# Patient Record
Sex: Male | Born: 1940 | Race: White | Hispanic: No | Marital: Married | Smoking: Former smoker
Health system: Southern US, Community
[De-identification: ages and names within clinical notes are randomized; demographics above are authoritative.]

## PROBLEM LIST (undated history)

## (undated) DIAGNOSIS — Z87442 Personal history of urinary calculi: Secondary | ICD-10-CM

## (undated) DIAGNOSIS — N281 Cyst of kidney, acquired: Secondary | ICD-10-CM

## (undated) DIAGNOSIS — G510 Bell's palsy: Secondary | ICD-10-CM

## (undated) DIAGNOSIS — N200 Calculus of kidney: Secondary | ICD-10-CM

## (undated) DIAGNOSIS — R3915 Urgency of urination: Principal | ICD-10-CM

## (undated) DIAGNOSIS — M1990 Unspecified osteoarthritis, unspecified site: Secondary | ICD-10-CM

## (undated) DIAGNOSIS — J383 Other diseases of vocal cords: Secondary | ICD-10-CM

## (undated) DIAGNOSIS — N401 Enlarged prostate with lower urinary tract symptoms: Secondary | ICD-10-CM

## (undated) DIAGNOSIS — C4490 Unspecified malignant neoplasm of skin, unspecified: Secondary | ICD-10-CM

## (undated) DIAGNOSIS — R931 Abnormal findings on diagnostic imaging of heart and coronary circulation: Secondary | ICD-10-CM

## (undated) DIAGNOSIS — IMO0002 Reserved for concepts with insufficient information to code with codable children: Secondary | ICD-10-CM

## (undated) DIAGNOSIS — M48061 Spinal stenosis, lumbar region without neurogenic claudication: Secondary | ICD-10-CM

## (undated) DIAGNOSIS — E785 Hyperlipidemia, unspecified: Secondary | ICD-10-CM

## (undated) DIAGNOSIS — I10 Essential (primary) hypertension: Secondary | ICD-10-CM

## (undated) DIAGNOSIS — K219 Gastro-esophageal reflux disease without esophagitis: Secondary | ICD-10-CM

## (undated) DIAGNOSIS — D649 Anemia, unspecified: Secondary | ICD-10-CM

## (undated) DIAGNOSIS — IMO0001 Reserved for inherently not codable concepts without codable children: Secondary | ICD-10-CM

## (undated) HISTORY — DX: Abnormal findings on diagnostic imaging of heart and coronary circulation: R93.1

## (undated) HISTORY — DX: Reserved for concepts with insufficient information to code with codable children: IMO0002

## (undated) HISTORY — DX: Reserved for inherently not codable concepts without codable children: IMO0001

## (undated) HISTORY — DX: Urgency of urination: R39.15

## (undated) HISTORY — DX: Other diseases of vocal cords: J38.3

## (undated) HISTORY — DX: Essential (primary) hypertension: I10

## (undated) HISTORY — PX: TONSILLECTOMY: SHX5217

## (undated) HISTORY — DX: Hyperlipidemia, unspecified: E78.5

## (undated) HISTORY — DX: Benign prostatic hyperplasia with lower urinary tract symptoms: N40.1

## (undated) HISTORY — DX: Calculus of kidney: N20.0

## (undated) HISTORY — PX: KNEE ARTHROSCOPY: SHX127

## (undated) HISTORY — DX: Bell's palsy: G51.0

## (undated) HISTORY — DX: Gastro-esophageal reflux disease without esophagitis: K21.9

## (undated) HISTORY — PX: MULTIPLE TOOTH EXTRACTIONS: SHX2053

## (undated) HISTORY — DX: Spinal stenosis, lumbar region without neurogenic claudication: M48.061

## (undated) HISTORY — PX: SHOULDER SURGERY: SHX246

## (undated) HISTORY — DX: Unspecified malignant neoplasm of skin, unspecified: C44.90

## (undated) HISTORY — DX: Unspecified osteoarthritis, unspecified site: M19.90

---

## 2000-02-02 ENCOUNTER — Ambulatory Visit (HOSPITAL_BASED_OUTPATIENT_CLINIC_OR_DEPARTMENT_OTHER): Admission: RE | Admit: 2000-02-02 | Discharge: 2000-02-02 | Payer: Self-pay | Admitting: Plastic Surgery

## 2000-02-02 ENCOUNTER — Encounter (INDEPENDENT_AMBULATORY_CARE_PROVIDER_SITE_OTHER): Payer: Self-pay | Admitting: *Deleted

## 2006-05-08 ENCOUNTER — Emergency Department (HOSPITAL_COMMUNITY): Admission: EM | Admit: 2006-05-08 | Discharge: 2006-05-08 | Payer: Self-pay | Admitting: Emergency Medicine

## 2006-08-16 ENCOUNTER — Encounter: Admission: RE | Admit: 2006-08-16 | Discharge: 2006-08-16 | Payer: Self-pay | Admitting: Orthopaedic Surgery

## 2006-10-23 DIAGNOSIS — IMO0001 Reserved for inherently not codable concepts without codable children: Secondary | ICD-10-CM

## 2006-10-23 HISTORY — DX: Reserved for inherently not codable concepts without codable children: IMO0001

## 2007-05-06 ENCOUNTER — Encounter: Admission: RE | Admit: 2007-05-06 | Discharge: 2007-05-06 | Payer: Self-pay | Admitting: Orthopaedic Surgery

## 2007-07-02 ENCOUNTER — Ambulatory Visit: Payer: Self-pay | Admitting: Vascular Surgery

## 2007-07-02 ENCOUNTER — Encounter (INDEPENDENT_AMBULATORY_CARE_PROVIDER_SITE_OTHER): Payer: Self-pay | Admitting: Internal Medicine

## 2007-07-02 ENCOUNTER — Inpatient Hospital Stay (HOSPITAL_COMMUNITY): Admission: EM | Admit: 2007-07-02 | Discharge: 2007-07-03 | Payer: Self-pay | Admitting: Emergency Medicine

## 2007-08-28 ENCOUNTER — Ambulatory Visit (HOSPITAL_BASED_OUTPATIENT_CLINIC_OR_DEPARTMENT_OTHER): Admission: RE | Admit: 2007-08-28 | Discharge: 2007-08-28 | Payer: Self-pay | Admitting: Orthopedic Surgery

## 2007-09-14 ENCOUNTER — Emergency Department (HOSPITAL_COMMUNITY): Admission: EM | Admit: 2007-09-14 | Discharge: 2007-09-14 | Payer: Self-pay | Admitting: Emergency Medicine

## 2008-01-01 ENCOUNTER — Ambulatory Visit (HOSPITAL_BASED_OUTPATIENT_CLINIC_OR_DEPARTMENT_OTHER): Admission: RE | Admit: 2008-01-01 | Discharge: 2008-01-01 | Payer: Self-pay | Admitting: Urology

## 2008-01-24 ENCOUNTER — Ambulatory Visit: Payer: Self-pay | Admitting: Vascular Surgery

## 2008-01-24 ENCOUNTER — Ambulatory Visit (HOSPITAL_COMMUNITY): Admission: RE | Admit: 2008-01-24 | Discharge: 2008-01-24 | Payer: Self-pay | Admitting: Orthopedic Surgery

## 2008-01-24 ENCOUNTER — Encounter (INDEPENDENT_AMBULATORY_CARE_PROVIDER_SITE_OTHER): Payer: Self-pay | Admitting: Orthopedic Surgery

## 2008-01-28 ENCOUNTER — Encounter: Admission: RE | Admit: 2008-01-28 | Discharge: 2008-01-28 | Payer: Self-pay | Admitting: Orthopedic Surgery

## 2008-02-10 ENCOUNTER — Encounter: Admission: RE | Admit: 2008-02-10 | Discharge: 2008-02-10 | Payer: Self-pay | Admitting: Urology

## 2008-06-03 ENCOUNTER — Ambulatory Visit (HOSPITAL_BASED_OUTPATIENT_CLINIC_OR_DEPARTMENT_OTHER): Admission: RE | Admit: 2008-06-03 | Discharge: 2008-06-03 | Payer: Self-pay | Admitting: Orthopedic Surgery

## 2008-12-14 ENCOUNTER — Emergency Department (HOSPITAL_COMMUNITY): Admission: EM | Admit: 2008-12-14 | Discharge: 2008-12-14 | Payer: Self-pay | Admitting: Family Medicine

## 2010-10-23 DIAGNOSIS — G510 Bell's palsy: Secondary | ICD-10-CM

## 2010-10-23 DIAGNOSIS — R931 Abnormal findings on diagnostic imaging of heart and coronary circulation: Secondary | ICD-10-CM

## 2010-10-23 HISTORY — DX: Abnormal findings on diagnostic imaging of heart and coronary circulation: R93.1

## 2010-10-23 HISTORY — DX: Bell's palsy: G51.0

## 2010-11-12 ENCOUNTER — Emergency Department (HOSPITAL_COMMUNITY)
Admission: EM | Admit: 2010-11-12 | Discharge: 2010-11-13 | Payer: Self-pay | Source: Home / Self Care | Admitting: Emergency Medicine

## 2010-11-15 LAB — POCT I-STAT, CHEM 8
BUN: 20 mg/dL (ref 6–23)
Calcium, Ion: 1.09 mmol/L — ABNORMAL LOW (ref 1.12–1.32)
Chloride: 107 mEq/L (ref 96–112)
Creatinine, Ser: 1 mg/dL (ref 0.4–1.5)
Glucose, Bld: 101 mg/dL — ABNORMAL HIGH (ref 70–99)
HCT: 44 % (ref 39.0–52.0)
Hemoglobin: 15 g/dL (ref 13.0–17.0)
Potassium: 3.6 mEq/L (ref 3.5–5.1)
Sodium: 138 mEq/L (ref 135–145)
TCO2: 23 mmol/L (ref 0–100)

## 2010-11-15 LAB — COMPREHENSIVE METABOLIC PANEL
ALT: 19 U/L (ref 0–53)
AST: 18 U/L (ref 0–37)
Albumin: 4.2 g/dL (ref 3.5–5.2)
Alkaline Phosphatase: 42 U/L (ref 39–117)
BUN: 18 mg/dL (ref 6–23)
CO2: 23 mEq/L (ref 19–32)
Calcium: 9.7 mg/dL (ref 8.4–10.5)
Chloride: 103 mEq/L (ref 96–112)
Creatinine, Ser: 0.94 mg/dL (ref 0.4–1.5)
GFR calc Af Amer: >60 mL/min (ref >60)
GFR calc non Af Amer: >60 mL/min (ref >60)
Glucose, Bld: 102 mg/dL — ABNORMAL HIGH (ref 70–99)
Potassium: 3.8 mEq/L (ref 3.5–5.1)
Sodium: 138 mEq/L (ref 135–145)
Total Bilirubin: 0.4 mg/dL (ref 0.3–1.2)
Total Protein: 7 g/dL (ref 6.0–8.3)

## 2010-11-15 LAB — CBC
HCT: 43 % (ref 39.0–52.0)
Hemoglobin: 14.5 g/dL (ref 13.0–17.0)
MCH: 30.4 pg (ref 26.0–34.0)
MCHC: 33.7 g/dL (ref 30.0–36.0)
MCV: 90.1 fL (ref 78.0–100.0)
Platelets: 223 10*3/uL (ref 150–400)
RBC: 4.77 MIL/uL (ref 4.22–5.81)
RDW: 13 % (ref 11.5–15.5)
WBC: 7.5 10*3/uL (ref 4.0–10.5)

## 2010-11-15 LAB — LIPID PANEL
Cholesterol: 154 mg/dL (ref 0–200)
HDL: 32 mg/dL — ABNORMAL LOW (ref >39)
LDL Cholesterol: 88 mg/dL (ref 0–99)
Total CHOL/HDL Ratio: 4.8 RATIO
Triglycerides: 172 mg/dL — ABNORMAL HIGH (ref <150)
VLDL: 34 mg/dL (ref 0–40)

## 2010-11-15 LAB — CK TOTAL AND CKMB (NOT AT ARMC)
CK, MB: 1.4 ng/mL (ref 0.3–4.0)
CK, MB: 1.1 ng/mL (ref 0.3–4.0)
Relative Index: INVALID (ref 0.0–2.5)
Relative Index: INVALID (ref 0.0–2.5)
Total CK: 71 U/L (ref 7–232)
Total CK: 54 U/L (ref 7–232)

## 2010-11-15 LAB — URINALYSIS, ROUTINE W REFLEX MICROSCOPIC
Bilirubin Urine: NEGATIVE
Hgb urine dipstick: NEGATIVE
Ketones, ur: NEGATIVE mg/dL
Nitrite: NEGATIVE
Protein, ur: NEGATIVE mg/dL
Specific Gravity, Urine: 1.025 (ref 1.005–1.030)
Urine Glucose, Fasting: NEGATIVE mg/dL
Urobilinogen, UA: 0.2 mg/dL (ref 0.0–1.0)
pH: 5.5 (ref 5.0–8.0)

## 2010-11-15 LAB — GLUCOSE, CAPILLARY: Glucose-Capillary: 102 mg/dL — ABNORMAL HIGH (ref 70–99)

## 2010-11-15 LAB — TROPONIN I
Troponin I: 0.02 ng/mL (ref 0.00–0.06)
Troponin I: 0.01 ng/mL (ref 0.00–0.06)

## 2010-11-15 LAB — PROTIME-INR
INR: 1.01 (ref 0.00–1.49)
Prothrombin Time: 13.5 seconds (ref 11.6–15.2)

## 2010-11-15 LAB — HEMOGLOBIN A1C
Hgb A1c MFr Bld: 5.7 % — ABNORMAL HIGH (ref <5.7)
Mean Plasma Glucose: 117 mg/dL — ABNORMAL HIGH (ref <117)

## 2010-11-15 LAB — APTT: aPTT: 32 seconds (ref 24–37)

## 2010-11-17 ENCOUNTER — Ambulatory Visit: Payer: Self-pay | Admitting: Cardiology

## 2010-11-19 NOTE — Consult Note (Signed)
NAME:  Adrian Davis, Adrian Davis                ACCOUNT NO.:  [id number]  MEDICAL RECORD NO.:  [id number]          PATIENT TYPE:  EMS  LOCATION:  [facility]                         FACILITY:  [facility]  PHYSICIAN:  Thana Farr, MD    DATE OF BIRTH:  June 23, 1941  DATE OF CONSULTATION:  11/13/2010 DATE OF DISCHARGE:  11/13/2010                                CONSULTATION   Consult called by Dr. Ignacia Palma.  HISTORY:  Adrian Davis is a 70 year old male who reports that about 2 weeks ago he began to have pain around his right ear.  This progressed to the point that over the past week he had the pain associated with numbness radiating to his mouth.  The pain stayed on the right side of the mouth and then went down to jaw.  On the day of presentation, when this numbness evolved to the point that it included the entire right side of his face.  He reports that he also had an episode where he had blurry vision from the right eye.  The episode last from 30 minutes to 4 hours. He describes the pain is severe, going to the top of his head and his neck and spreading to his face.  The pain was started behind the ear. He rates the pain at 8-10/10.  The periods of pain is so severe that it stops him from sleeping.  PAST MEDICAL HISTORY:  Hypertension and cholesterolemia.  SOCIAL HISTORY:  The patient chews tobacco.  There is no history of smoking, alcohol, or illicit drug abuse.  He is married.  He is a retired Visual merchandiser.  MEDICATIONS AT HOME:  Amlodipine, aspirin, benazepril, hydrochlorothiazide, potassium, and simvastatin.  ALLERGIES:  No known drug allergies.  PHYSICAL EXAMINATION:  VITAL SIGNS:  Blood pressure 148/79, heart rate 73, respiratory rate 22, and temperature 97.6. HEENT:  The patient has pain on palpation behind the ear and into the neck.  There is a mild degree of pain on palpation along the jaw line and to the mouth. MENTAL STATUS EXAM:  The patient is alert and oriented.  He follow commands  without difficulty.  Speech is fluent.  On cranial nerve testing, V and VII he has decreased right nasolabial fold.  Would appear there is decreased collage of the right eye.  Motor exam, the patient is 5/5 throughout.  There is normal tone and bulk.  Sensation pinprick and light touch, oriented bilaterally.  Deep tendon reflexes are 1+ throughout with absent ankle jerks.  Plantars are mute bilaterally.  On cerebellar testing, finger-to-nose and heel-to-shin are intact.  LABORATORY DATA:  White blood cell count 7.5, platelet count 223, hemoglobin/hematocrit 14.5, 43.0, sodium 138, potassium 3.8, chloride 103, CO2 of 23, BUN and creatinine 18 and 0.94 respectively.  Glucose 102, SGOT, SGPT 18 and 19 respectively.  PT/INR and PTT 13.5, 1.0, 132. Urinalysis negative.  Troponin negative.  MRI and MRA of the head was performed.  Initially there was some question of a brain stem event. The patient returned back to the MRI scan and was on special cuff on to the brain stem.  Brain stem was normal.  Head CT was unremarkable.  ASSESSMENT:  Adrian Davis is a 70 year old male that presents with a history of symptoms that suggests of a neuralgia.  The patient seems to be having some mild weakness associated with neuralgia as well.  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  Currently though he is able to wrinkle his forehead. Orlene Erm was held with the family.  We have ruled out all concerning possibilities such as aneurysm, stroke, tumor, bleed, and multiple sclerosis.  PLAN: 1. We will give the patient a trial of steroid and antivirals. 2. The patient is to follow with [facility] this week.          ______________________________ Thana Farr, MD     LR/MEDQ  D:  11/13/2010  T:  11/14/2010  Job:  [id number]  Electronically Signed by Thana Farr MD on 11/19/2010 09:37:00 AM

## 2011-01-19 ENCOUNTER — Other Ambulatory Visit: Payer: Self-pay | Admitting: *Deleted

## 2011-01-19 DIAGNOSIS — E785 Hyperlipidemia, unspecified: Secondary | ICD-10-CM

## 2011-01-19 MED ORDER — SIMVASTATIN 10 MG PO TABS: 10.0000 mg | ORAL_TABLET | Freq: Every evening | ORAL | Status: DC

## 2011-01-19 NOTE — Telephone Encounter (Signed)
escribe medication per fax request  

## 2011-02-19 ENCOUNTER — Other Ambulatory Visit: Payer: Self-pay | Admitting: Cardiology

## 2011-02-19 DIAGNOSIS — I10 Essential (primary) hypertension: Secondary | ICD-10-CM

## 2011-02-20 NOTE — Telephone Encounter (Signed)
escribe medication per fax request  

## 2011-03-07 NOTE — Op Note (Signed)
NAME:  Adrian Davis, Adrian Davis                ACCOUNT NO.:  [id number]   MEDICAL RECORD NO.:  [id number]          PATIENT TYPE:  AMB   LOCATION:  [facility]                         FACILITY:  [facility]   PHYSICIAN:  Ollen Gross, M.D.    DATE OF BIRTH:  1941-09-23   DATE OF PROCEDURE:  08/28/2007  DATE OF DISCHARGE:                               OPERATIVE REPORT   PREOPERATIVE DIAGNOSIS:  Left knee meniscal tear and loose body.   POSTOPERATIVE DIAGNOSIS:  Left knee medial meniscal tear, loose body and  hypertrophic synovitis.   PROCEDURE:  Left knee arthroscopy with meniscal debridement and  synovectomy.   SURGEON:  Ollen Gross, M.D., no assistant.   ANESTHESIA:  General.   BLOOD LOSS:  Minimal.   DRAINS:  None.   COMPLICATIONS:  None.   CONDITION:  Stable to recovery.   BRIEF CLINICAL NOTE:  [name] is a 70 year old male with significant  left knee pain and mechanical symptoms.  The knee has been bothering him  for quite awhile getting progressively worse.  He presents now for  arthroscopy and debridement.   PROCEDURE IN DETAIL:  After successful administration of general  anesthetic, a tourniquet placed on the left thigh and left lower  extremity prepped and draped in the usual sterile fashion.  Standard  superomedial and inferolateral incisions were made, inflow cannula  passed superomedial and camera passed inferolateral.  Arthroscopic  visualization proceeds.  The undersurface of patella and trochlea looked  normal.  The suprapatellar area shows a significant amount of synovitis.  The medial and lateral gutters also show some synovitis.  Flexion and  valgus force applied to the knee and the medial compartment is entered.  Spinal needle was used to localize the inferomedial portal, small  incision made, dilator placed and probe placed.  He does have a tear in  the body and posterior horn of the medial meniscus.  This was debrided  back to stable base with baskets and 4.2 mm shaver  and sealed off with  the ArthroCare.  Had a small amount of chondromalacia on the medial  femoral condyle but no unstable cartilage.  I would say it is grade 2  with 3 in the small focal area, otherwise grade 1 and two.  The tibial  plateau looked fine.  Intercondylar notch was visualized, ACL was  normal.  There were two loose pieces tethered down to tissue in the  space just anterior to the ACL.  I debrided the soft tissue around these  with the ArthroCare and then removed those two loose bodies.  The  lateral compartment was then entered and it looked normal.  I then used  the ArthroCare to debride the hypertrophic synovium throughout the  suprapatellar area, infrapatellar area and medial and lateral gutters.  We then inspected the joint again.  There were no further tears or loose  bodies.  The arthroscopic  equipment was removed from the inferior portals which were closed with  interrupted 4-0 nylon.  20 mL of 0.25% Marcaine with epinephrine  injected through the inflow cannula and that is removed and that  portal  closed with nylon.  A bulky sterile dressing is applied and he is  awakened and transferred to recovery in stable condition.      Ollen Gross, M.D.  Electronically Signed     FA/MEDQ  D:  08/28/2007  T:  08/29/2007  Job:  [id number]

## 2011-03-07 NOTE — H&P (Signed)
NAME:  Giancola, Cathan                ACCOUNT NO.:  [id number]   MEDICAL RECORD NO.:  [id number]          PATIENT TYPE:  EMS   LOCATION:  [facility]                         FACILITY:  [facility]   PHYSICIAN:  Elliot Cousin, M.D.    DATE OF BIRTH:  [birth year]   DATE OF ADMISSION:  07/01/2007  DATE OF DISCHARGE:                              HISTORY & PHYSICAL   PRIMARY CARE PHYSICIAN:  Loma Sender, M.D.  The patient is  unassigned to [place].   PRIMARY CARDIOLOGIST:  Peter M. Swazil[place], M.D.   CHIEF COMPLAINT:  Dizziness, nausea, and generalized weakness.   HISTORY OF PRESENT ILLNESS:  The patient is a 70 year old male with a  past medical history significant for hypertension and hyperlipidemia who  presents to the emergency department with the chief complaint of  dizziness, nausea, and generalized weakness.  The patient's symptoms  started a few days ago.  They became worse today.  At approximately 4  p.m., he had symptoms consistent with vertigo.  His dizziness was  described as a spinning dizziness and was associated with nausea and two  episodes of vomiting.  He had no coffee ground emesis and no bright red  blood in his emesis.  He only had a mild amount of abdominal discomfort  located primarily over the lower abdomen.  He denies any associated  diarrhea, constipation, melena, bright red blood per rectum, and painful  urination.  He also denies chest pain, shortness of breath, and  subjective fever and chills.  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  According to his wife, she pulled a tick  from his lower buttocks approximately two months ago.  The patient's  primary care physician ordered a Lyme titer and it was apparently  normal.  The patient has had generalized weakness since the tick bite.   The patient denies headache, visual changes, or swelling in his legs.  He has had a cough which has been dry and intermittent over the past two  months.  His review of  systems is positive for generalized weakness,  easy fatigability, and unintentional weight loss.   During the evaluation in the emergency department, the patient is noted  to be afebrile and hemodynamically stable.  His EKG reveals nonspecific  T wave abnormalities.  His lab data are virtually unremarkable.  His  chest x-ray reveals no acute active cardiopulmonary disease.  However,  given the patient's presentation, he will be admitted for further  evaluation and management.   PAST MEDICAL HISTORY:  1. Hypertension.  2. Hyperlipidemia.  3. Degenerative joint disease.  4. Status post tonsillectomy in the past.  5. Left knee arthroscopic surgery in the past.  6. Chewing tobacco abuse.   MEDICATIONS:  1. Benazepril 20 mg two tablets daily.  2. Toprol-XL 50 mg daily.  3. Hydrochlorothiazide 25 mg daily.  4. Aspirin 81 mg daily.  5. Omeprazole 20 mg two tablets daily.  6. Discontinued Vytorin three weeks ago secondary to muscle aches.  7. Tylenol 650 mg as needed.   ALLERGIES:  No known drug allergies.  SOCIAL HISTORY:  The patient  is married.  He has one son.  [place]  He is a Visual merchandiser.  He chews two to three  packs of tobacco daily.  He denies alcohol and illicit drug use.   FAMILY HISTORY:  His mother is 63 years of age and has degenerative  joint disease.  His father died of complications from a motor vehicle  accident at 69 years of age.  He had a history of congestive heart  failure.   REVIEW OF SYSTEMS:  See the history of present illness.   PHYSICAL EXAMINATION:  VITAL SIGNS:  Temperature 97.4, blood pressure  167/83, pulse 67, respiratory rate 18, oxygen saturation 100% on room  air.  GENERAL:  The patient is a pleasant, large-framed 70 year old Caucasian  man who is currently lying in bed in no acute distress.  HEENT:  Head is normocephalic, atraumatic.  Pupils are equal, round and  reactive to light.  Extraocular movements are  intact.  Conjunctivae  clear.  Sclerae white.  Tympanic membranes are obscured by cerumen  bilaterally.  Nasal mucosa is dry.  No sinus tenderness.  Oropharynx  reveals fair dentition.  Tobacco stains on his tongue noted.  Mucous  membranes are dry.  No posterior exudates or erythema.  No obvious  suspicious lesions of his oral mucosa.  NECK:  Supple.  No adenopathy.  No thyromegaly.  No JVD.  There is a  questionable bruit on the right.  HEART:  S1 and S2 with  soft systolic murmur.  LUNGS:  Clear to auscultation bilaterally.  ABDOMEN:  Mildly obese.  Positive bowel sounds.  Soft, nontender,  nondistended.  No hepatosplenomegaly.  No masses palpated.  RECTAL/GU:  Deferred.  EXTREMITIES:  Pedal pulses are 2+.  No pretibial edema.  No pedal edema.  SKIN:  Skin turgor fair to good.  No obvious rashes noted.  NEUROLOGIC:  The patient is alert and oriented x2.  Cranial nerves II-  XII intact.  No obvious nystagmus.  No pronator drift.  Strength is 5/5  in the sitting position.  Sensation is grossly intact.  Gait not  assessed.   ADMISSION LABORATORY DATA:  CT scan of the head pending.  Sodium 142,  potassium 3.5, chloride 106, CO2 26, glucose 117, BUN 17, creatinine  0.86.  Calcium 9.1, total protein 6.6, albumin 4, AST 14, ALT 15, lipase  18.  CK 67.  Troponin-I less than 0.05.  CK-MB less than 1.  WBC 10,  hemoglobin 13.4, platelets 243,000.   ASSESSMENT:  1. Sign/symptom complex with dizziness, nausea, and generalized      weakness.  The patient appears to have symptomatology consistent      with vertigo.  The vertigo could be benign positional vertigo or      something more serious.  We will need to rule out acute      abnormalities of the cerebral posterior circulation.  2. Nonspecific T wave abnormalities per EKG.  The patient  has no      complaints of chest pain.  However, given his risk factors, cardiac      enzymes will be ordered.  3. Right carotid bruit.  Carotid artery  disease will need to be ruled      out.  4. Borderline hypokalemia.  More than likely the borderline      hypokalemia is secondary to hydrochlorothiazide.  5. History of tick bite in July 2008.  The patient has no  obvious      rashes per exam.  He is completely afebrile.  His white blood cell      count is within normal limits.  Of note, a Lyme titer was obtained      by the patient's primary care physician in July and was apparently      normal.   PLAN:  1. The patient will be admitted for further evaluation and management.  2. The results of his CT scan of the head are pending, will review.  3. For further evaluation, we will check an MRI of the brain and      carotid Dopplers.  4. We will check cardiac enzymes to rule out myocardial infarction,      [facility] Spotted Fever titer, TSH,  vitamin B12, urinalysis      to rule out infection, and an urine drug screen.  5. Will check orthostatic vital signs.  6. Will start Antivert t.i.d.  7. Supportive care and gentle IV fluids.      Elliot Cousin, M.D.  Electronically Signed     DF/MEDQ  D:  07/02/2007  T:  07/02/2007  Job:  [id number]   cc:   Peter M. [place], M.D.

## 2011-03-07 NOTE — Op Note (Signed)
NAME:  Adrian Davis, Adrian Davis                ACCOUNT NO.:  [id number]   MEDICAL RECORD NO.:  [id number]          PATIENT TYPE:  AMB   LOCATION:  [facility]                         FACILITY:  [facility]   PHYSICIAN:  Ollen Gross, M.D.    DATE OF BIRTH:  Feb 17, 1941   DATE OF PROCEDURE:  06/03/2008  DATE OF DISCHARGE:                               OPERATIVE REPORT   PREOPERATIVE DIAGNOSIS:  Left knee chondral defect.   POSTOPERATIVE DIAGNOSES:  1. Left knee chondral defect.  2. Medial meniscal tear.   PROCEDURE:  Left knee arthroscopy with medial meniscal debridement and  chondroplasty medial compartment.   SURGEON:  Ollen Gross, M.D.   ASSISTANT:  None.   ANESTHESIA:  General.   ESTIMATED BLOOD LOSS:  Minimal.   DRAIN:  None.   COMPLICATIONS:  None.   CONDITION.:  Stable to recovery.   CLINICAL NOTE:  Mr. Sweitzer is a 70 year old male with significant left  knee pain, recurrent effusions and mechanical symptoms.  He had an  arthroscopy a year ago, did very well initially, but then started having  progressively worsening dysfunction and pain.  He had injections, which  did not help much.  He had an MRI which showed no evidence of recurrent  tear.  Given his progressive pain, recurrent effusions and mechanical  symptoms with no response to injections, it was felt that he would be a  candidate for repeat arthroscopy.  He reports for that at this point.   PROCEDURE IN DETAIL:  After successful administration of general  anesthetic a tourniquet was placed high on the left thigh and left lower  extremity prepped and draped in the usual sterile fashion.  Standard  superomedial and inferolateral incision is made.  In-flow cannula  passed, superomedial camera passed inferolateral.  Arthroscopic  visualization proceeds.  Undersurface of the patella and the trochlea  show minimal chondromalacia.  He did have grade II lesion on the  undersurface of the patella, but it is fairly local.  The medial  and  lateral gutters were visualized.  There were no loose bodies.  Flexion  and valgus force applied to the knee and the medial compartment  centered.  He does have evidence of a recurrent tear in the body and  posterior horn of the medial meniscus, as well as delaminated cartilage  from the medial femoral condyle.  At the medial most margin of the  joint, there is a small area of exposed bone on the tibia and the femur.  The spinal needle was used to localize the inferomedial portal.  Small  incision made and dilator placed.  The meniscus is debrided back to a  stable base with baskets and a 4.2 mm shaver and sealed off with the  ArthroCare device.  I debrided the unstable cartilage and the surface of  the femur with the shaver down to a stable bony base with stable  cartilaginous edges.  This was a small defect down to bone, but there  was about a 2 x 2 cm area of cartilage that was unstable, but debrided  back to a stable cartilaginous  base.  The rest of the medial compartment  looked fine.  Intercondylar notch was visualized.  There are no loose  bodies and the ACL looks normal.  Lateral compartment is entered and it  looks normal.  The joint is again inspected.  There were no further  tears, loose bodies, or chondral defects.  Arthroscopic equipment was  removed from the inferior portals, which were closed with interrupted 4-  0 nylon.  Twenty mL of 4% Marcaine with epi injected through in-flow  cannula and then that is removed and that portal closed with nylon.  Bulky sterile dressing is then applied and he is awakened and  transported to recovery in stable condition.      Ollen Gross, M.D.  Electronically Signed     FA/MEDQ  D:  06/03/2008  T:  06/03/2008  Job:  [id number]

## 2011-03-07 NOTE — Op Note (Signed)
NAME:  Adrian Davis, Adrian Davis                ACCOUNT NO.:  [id number]   MEDICAL RECORD NO.:  [id number]          PATIENT TYPE:  AMB   LOCATION:  [facility]                         FACILITY:  [facility]   PHYSICIAN:  Excell Seltzer. Annabell Howells, M.D.    DATE OF BIRTH:  1941/05/20   DATE OF PROCEDURE:  01/01/2008  DATE OF DISCHARGE:                               OPERATIVE REPORT   PREOPERATIVE DIAGNOSIS:  Right distal ureteral stone.   POSTOPERATIVE DIAGNOSIS:  Right distal ureteral stone.   PROCEDURE:  1. Cystourethroscopy.  2. Right retrograde pyelogram.  3. Right ureteral dilation.  4. Right ureteroscopic stone manipulation.  5. Intraoperative fluoroscopy with interpretation.   ATTENDING PHYSICIAN:  Excell Seltzer. Annabell Howells, M.D.   RESIDENT PHYSICIAN:  Dr. Maudie Flakes.   ANESTHESIA:  General.   INDICATIONS FOR PROCEDURE:  Mr. Bufford Buttner is a 70 year old white male who  was seen by Dr. Annabell Howells for refractory lower urinary tract symptoms.  Axial imaging demonstrated a distal right ureteral stone.  He has not  passed this stone clinically, as he persists with continued discomfort.  Likewise, he was offered ureteroscopic stone manipulation.  After the  risks and benefits of the operation were discussed with him, informed  consent was obtained.   PROCEDURE IN DETAIL:  Patient is brought to the operating room,  correctly identified by his wrist band, and placed in a supine position.  General anesthesia was delivered.  Once adequately anesthetized, his  perineum was prepped and draped sterilely.  IV antibiotics were  administered.  We began our procedure by performing a rigid  cystourethroscopy with a 22 French rigid cystoscopic sheath and a 12  degree lens.  The urethra was normal through its course and caliber.  Upon entering the bladder, clear urine was identified.  Both ureteral  orifices were noted to be in the normal anatomic position, effluxing  clear urine.  Pan cystoscopy did not demonstrate any urethral  abnormalities.  We then cannulated the right ureteral orifice with a 5  French end-hole catheter and perform right retrograde pyelogram.  The  pyelogram demonstrated a fixed filling defect in the distal ureter,  consistent with known stone.  No other abnormalities were appreciated.  We then advanced a sensor-tipped guidewire through the cystoscope into  the ureteral orifice and directed it up to the level of the right renal  pelvis under direct fluoroscopic guidance.  The guidewire was left in  place, and we used a 12 [place] ureteral dilator over the guidewire to  gently dilate the distal ureter using fluoroscopy as guidance as well.  The dilator was then removed, and we performed semi-rigid ureteroscopy,  which demonstrated the distal right ureteral stone.  It was free-  floating and not impacted.  It appeared to be oriented in a way that  prevented its passage.  We subsequently were able to grasp it with a  Nitinol basket and remove it in an antegrade fashion without significant  ureteral trauma.  The ureteroscope was brought forth out of the urethra,  and the stone was delivered into the specimen cup.  We then replaced the  ureteroscope back into the urethra and guided it retrograde up to the  ureter and closely inspected the ureter.  There was not a significant  amount of bleeding here.  Likewise, we elected not to place a stent.  The ureteroscope and the Glidewire were then removed.  The cystoscope  was replaced.  Repeat cystoscope did not demonstrate any abnormalities.  This was done with a 12 degree and a 70 degree lens.  The bladder was  drained, and this marked the end of our procedure.  He tolerated the  procedure well.  There were no complications.  He was awakened and taken  to the PACU in stable condition.  Dr. Annabell Howells was present and participated  in all aspects of the case.      Duane Boston, MD      Excell Seltzer. Annabell Howells, M.D.  Electronically Signed    BP/MEDQ  D:   01/01/2008  T:  01/01/2008  Job:  [id number]

## 2011-03-16 ENCOUNTER — Telehealth: Payer: Self-pay | Admitting: Cardiology

## 2011-03-16 NOTE — Telephone Encounter (Signed)
Patient wife called and asked if patient needs to have lab work done at appt time.

## 2011-03-21 ENCOUNTER — Other Ambulatory Visit: Payer: Self-pay | Admitting: Cardiology

## 2011-03-21 NOTE — Telephone Encounter (Signed)
escribe medication per fax request  

## 2011-04-10 ENCOUNTER — Other Ambulatory Visit: Payer: Self-pay | Admitting: Cardiology

## 2011-05-02 ENCOUNTER — Other Ambulatory Visit: Payer: Self-pay | Admitting: *Deleted

## 2011-05-02 DIAGNOSIS — E785 Hyperlipidemia, unspecified: Secondary | ICD-10-CM

## 2011-05-09 ENCOUNTER — Other Ambulatory Visit: Payer: Self-pay | Admitting: Cardiology

## 2011-05-09 NOTE — Telephone Encounter (Signed)
escribe medication per fax request  

## 2011-05-15 ENCOUNTER — Other Ambulatory Visit: Payer: Self-pay | Admitting: Orthopaedic Surgery

## 2011-05-15 ENCOUNTER — Other Ambulatory Visit (INDEPENDENT_AMBULATORY_CARE_PROVIDER_SITE_OTHER): Payer: Medicare Other | Admitting: *Deleted

## 2011-05-15 ENCOUNTER — Other Ambulatory Visit: Payer: Self-pay | Admitting: Cardiology

## 2011-05-15 DIAGNOSIS — M25511 Pain in right shoulder: Secondary | ICD-10-CM

## 2011-05-15 DIAGNOSIS — R5383 Other fatigue: Secondary | ICD-10-CM

## 2011-05-15 DIAGNOSIS — Z125 Encounter for screening for malignant neoplasm of prostate: Secondary | ICD-10-CM

## 2011-05-15 DIAGNOSIS — R5381 Other malaise: Secondary | ICD-10-CM

## 2011-05-15 DIAGNOSIS — E785 Hyperlipidemia, unspecified: Secondary | ICD-10-CM

## 2011-05-15 DIAGNOSIS — R39198 Other difficulties with micturition: Secondary | ICD-10-CM

## 2011-05-15 LAB — LIPID PANEL
Cholesterol: 176 mg/dL (ref 0–200)
HDL: 39.3 mg/dL (ref >39.00)
Total CHOL/HDL Ratio: 4
Triglycerides: 229 mg/dL — ABNORMAL HIGH (ref 0.0–149.0)
VLDL: 45.8 mg/dL — ABNORMAL HIGH (ref 0.0–40.0)

## 2011-05-15 LAB — BASIC METABOLIC PANEL
BUN: 19 mg/dL (ref 6–23)
CO2: 28 mEq/L (ref 19–32)
Calcium: 9.1 mg/dL (ref 8.4–10.5)
Chloride: 100 mEq/L (ref 96–112)
Creatinine, Ser: 0.8 mg/dL (ref 0.4–1.5)
GFR: 96.3 mL/min (ref >60.00)
Glucose, Bld: 95 mg/dL (ref 70–99)
Potassium: 4.2 mEq/L (ref 3.5–5.1)
Sodium: 136 mEq/L (ref 135–145)

## 2011-05-15 LAB — PSA: PSA: 0.72 ng/mL (ref 0.10–4.00)

## 2011-05-15 LAB — HEPATIC FUNCTION PANEL
ALT: 15 U/L (ref 0–53)
AST: 14 U/L (ref 0–37)
Albumin: 4.3 g/dL (ref 3.5–5.2)
Alkaline Phosphatase: 40 U/L (ref 39–117)
Bilirubin, Direct: 0 mg/dL (ref 0.0–0.3)
Total Bilirubin: 0.5 mg/dL (ref 0.3–1.2)
Total Protein: 7.2 g/dL (ref 6.0–8.3)

## 2011-05-15 LAB — LDL CHOLESTEROL, DIRECT: Direct LDL: 102 mg/dL

## 2011-05-15 NOTE — Telephone Encounter (Signed)
escribe medication per fax request  

## 2011-05-16 ENCOUNTER — Encounter: Payer: Self-pay | Admitting: *Deleted

## 2011-05-17 ENCOUNTER — Ambulatory Visit (INDEPENDENT_AMBULATORY_CARE_PROVIDER_SITE_OTHER): Payer: Medicare Other | Admitting: Cardiology

## 2011-05-17 ENCOUNTER — Encounter: Payer: Self-pay | Admitting: Cardiology

## 2011-05-17 DIAGNOSIS — I10 Essential (primary) hypertension: Secondary | ICD-10-CM

## 2011-05-17 DIAGNOSIS — E785 Hyperlipidemia, unspecified: Secondary | ICD-10-CM | POA: Insufficient documentation

## 2011-05-17 NOTE — Progress Notes (Signed)
   Adrian Davis Date of Birth: [birth year]   History of Present Illness: Adrian Davis is seen today for followup of hypertension. He continues to do very well. His blood pressure readings at home have been under good control with typical readings of 120 systolic in the morning and 135 systolic in the evening. He denies any headache, chest pain, shortness of breath. 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  Current Outpatient Prescriptions on File Prior to Visit  Medication Sig Dispense Refill  . acetaminophen (TYLENOL) 650 MG CR tablet Take 650 mg by mouth daily.       Marland Kitchen aspirin 81 MG tablet Take 81 mg by mouth daily. coated      . benazepril (LOTENSIN) 20 MG tablet TAKE 1 TABLET TWICE DAILY  60 tablet  5  . hydrochlorothiazide 25 MG tablet TAKE 1 TABLET EVERY DAY BY MOUTH  30 tablet  5  . ibuprofen (ADVIL,MOTRIN) 200 MG tablet Take 200 mg by mouth daily.        Marland Kitchen KLOR-CON M10 10 MEQ tablet TAKE 2 TABLETS BY MOUTH EVERY DAY  60 tablet  5  . ranitidine (ZANTAC) 300 MG tablet TAKE 1 TABLET EVERY DAY  30 tablet  5  . simvastatin (ZOCOR) 10 MG tablet Take 1 tablet (10 mg total) by mouth every evening.  30 tablet  5  . DISCONTD: amLODipine (NORVASC) 5 MG tablet TAKE 2 TABLETS BY MOUTH DAILY AS DIRECTED  60 tablet  5    No Known Allergies  Past Medical History  Diagnosis Date  . Hyperlipidemia   . Hypertension   . Degenerative disk disease   . Bell palsy   . Edema     Past Surgical History  Procedure Date  . Shoulder surgery     LEFT  . Tonsillectomy   . Knee arthroscopy     RIGHT    History  Smoking status  . Former Smoker -- 1.5 packs/day for 10 years  . Types: Cigarettes  . Quit date: 05/15/1976  Smokeless tobacco  . Former User    History  Alcohol Use No    Family History  Problem Relation Age of Onset  . Heart attack Father   . Coronary artery disease Father   . Hypertension Father     Review of Systems:  All other systems were reviewed and are  negative.  Physical Exam: BP 110/72  Pulse 84  Ht 5\' 9"  (1.753 m)  Wt 197 lb 9.6 oz (89.631 kg)  BMI 29.18 kg/m2 He is a pleasant white male in no acute distress. His HEENT exam is unremarkable. He has no JVD or bruits. Lungs are clear. Cardiac exam reveals a regular rate and rhythm without gallop, murmur, or click. Abdomen is soft and nontender. He has no significant edema. Pedal pulses are good. His neurologic exam is nonfocal.  LABORATORY DATA: Recent chemistry panel was normal. Total cholesterol 176, triglycerides 229, HDL 39, and LDL 102.  Assessment / Plan:

## 2011-05-17 NOTE — Assessment & Plan Note (Signed)
His lipid levels are stable from one year ago. He will continue his simvastatin. I recommended he take 2 facial capsules daily. He admits that he needs to do better with a healthy diet.

## 2011-05-17 NOTE — Patient Instructions (Signed)
Take 2 fish oil tablets daily.  Continue other medications.  I will see you again in 6 months.  You are cleared for shoulder and/or knee surgery.

## 2011-05-17 NOTE — Assessment & Plan Note (Signed)
His blood pressure is well controlled on his current medical therapy. I've encouraged him to watch his salt intake carefully. He needs to stay well hydrated when he is out working in the sun.

## 2011-05-18 ENCOUNTER — Telehealth: Payer: Self-pay | Admitting: *Deleted

## 2011-05-18 NOTE — Telephone Encounter (Signed)
Message copied by Lorayne Bender on Thu May 18, 2011  9:55 AM ------      Message from: [place], PETER M      Created: Tue May 16, 2011  8:53 PM       Chemistries and PSA look good. Lipids are a little high. I would change simvastatin to lipitor 20 mg daily and repeat lipids and HFP in 3 months.

## 2011-05-18 NOTE — Telephone Encounter (Signed)
Notified wife of lab results. Wants to switch simvastatin to Lipitor. Per wife in office visit he told him to use fish oil. Did not mention Lipitor. States he would not take Lipitor due to past history of reactions. Will get fish oil

## 2011-05-19 ENCOUNTER — Ambulatory Visit
Admission: RE | Admit: 2011-05-19 | Discharge: 2011-05-19 | Disposition: A | Discharge status: Home / Self Care | Payer: BLUE CROSS/BLUE SHIELD | Source: Ambulatory Visit | Attending: Orthopaedic Surgery | Admitting: Orthopaedic Surgery

## 2011-05-19 DIAGNOSIS — M25511 Pain in right shoulder: Secondary | ICD-10-CM

## 2011-07-11 ENCOUNTER — Other Ambulatory Visit: Payer: Self-pay | Admitting: Cardiology

## 2011-07-11 NOTE — Telephone Encounter (Signed)
escribe medication per fax request  

## 2011-07-17 LAB — POCT I-STAT, CHEM 8
BUN: 22
Calcium, Ion: 1.27
Chloride: 102
Creatinine, Ser: 1
Glucose, Bld: 94
HCT: 43
Hemoglobin: 14.6
Potassium: 3.6
Sodium: 141
TCO2: 25

## 2011-07-21 LAB — POCT I-STAT 4, (NA,K, GLUC, HGB,HCT)
Glucose, Bld: 101 — ABNORMAL HIGH
HCT: 43
Hemoglobin: 14.6
Potassium: 3.7
Sodium: 140

## 2011-08-01 LAB — POCT I-STAT 4, (NA,K, GLUC, HGB,HCT)
Glucose, Bld: 96
HCT: 44
Hemoglobin: 15
Operator id: 268271
Potassium: 4.1
Sodium: 139

## 2011-08-04 LAB — I-STAT 8, (EC8 V) (CONVERTED LAB)
BUN: 20
Bicarbonate: 27.3 — ABNORMAL HIGH
Chloride: 109
Glucose, Bld: 112 — ABNORMAL HIGH
HCT: 41
Hemoglobin: 13.9
Operator id: 151321
Potassium: 3.7
Sodium: 143
TCO2: 29
pCO2, Ven: 54.2 — ABNORMAL HIGH
pH, Ven: 7.311 — ABNORMAL HIGH

## 2011-08-04 LAB — CBC
HCT: 35.4 — ABNORMAL LOW
HCT: 38.5 — ABNORMAL LOW
Hemoglobin: 12.1 — ABNORMAL LOW
Hemoglobin: 13.4
MCHC: 34.3
MCHC: 34.8
MCV: 89.6
MCV: 90.4
Platelets: 196
Platelets: 243
RBC: 3.92 — ABNORMAL LOW
RBC: 4.3
RDW: 13.6
RDW: 13.7
WBC: 10
WBC: 7.1

## 2011-08-04 LAB — BASIC METABOLIC PANEL
BUN: 11
CO2: 27
Calcium: 9
Chloride: 110
Creatinine, Ser: 0.97
GFR calc Af Amer: >60
GFR calc non Af Amer: >60
Glucose, Bld: 86
Potassium: 3.4 — ABNORMAL LOW
Sodium: 145

## 2011-08-04 LAB — CARDIAC PANEL(CRET KIN+CKTOT+MB+TROPI)
CK, MB: 1.3
CK, MB: 1.3
Relative Index: INVALID
Relative Index: INVALID
Total CK: 71
Total CK: 71
Troponin I: 0.01
Troponin I: 0.01

## 2011-08-04 LAB — URINE CULTURE
Colony Count: NO GROWTH
Culture: NO GROWTH

## 2011-08-04 LAB — POCT CARDIAC MARKERS
CKMB, poc: <1 — ABNORMAL LOW
Myoglobin, poc: 78.9
Operator id: 151321
Troponin i, poc: <0.05

## 2011-08-04 LAB — URINALYSIS, MICROSCOPIC ONLY
Bilirubin Urine: NEGATIVE
Glucose, UA: NEGATIVE
Hgb urine dipstick: NEGATIVE
Ketones, ur: NEGATIVE
Leukocytes, UA: NEGATIVE
Nitrite: NEGATIVE
Protein, ur: NEGATIVE
Specific Gravity, Urine: 1.022
Urobilinogen, UA: 0.2
pH: 7

## 2011-08-04 LAB — DIFFERENTIAL
Basophils Absolute: 0
Basophils Relative: 0
Eosinophils Absolute: 0
Eosinophils Relative: 0
Lymphocytes Relative: 16
Lymphs Abs: 1.6
Monocytes Absolute: 0.4
Monocytes Relative: 4
Neutro Abs: 8 — ABNORMAL HIGH
Neutrophils Relative %: 80 — ABNORMAL HIGH

## 2011-08-04 LAB — RAPID URINE DRUG SCREEN, HOSP PERFORMED
Amphetamines: NOT DETECTED
Barbiturates: NOT DETECTED
Benzodiazepines: NOT DETECTED
Cocaine: NOT DETECTED
Opiates: NOT DETECTED
Tetrahydrocannabinol: NOT DETECTED

## 2011-08-04 LAB — CK TOTAL AND CKMB (NOT AT ARMC)
CK, MB: 1.2
Relative Index: INVALID
Total CK: 62

## 2011-08-04 LAB — COMPREHENSIVE METABOLIC PANEL
ALT: 15
AST: 14
Albumin: 4
Alkaline Phosphatase: 41
BUN: 17
CO2: 26
Calcium: 9.1
Chloride: 106
Creatinine, Ser: 0.86
GFR calc Af Amer: >60
GFR calc non Af Amer: >60
Glucose, Bld: 117 — ABNORMAL HIGH
Potassium: 3.5
Sodium: 142
Total Bilirubin: 0.7
Total Protein: 6.6

## 2011-08-04 LAB — ROCKY MTN SPOTTED FVR AB, IGM-BLOOD: RMSF IgM: 0.32

## 2011-08-04 LAB — POCT I-STAT CREATININE
Creatinine, Ser: 1
Operator id: 151321

## 2011-08-04 LAB — VITAMIN B12: Vitamin B-12: 462 (ref 211–911)

## 2011-08-04 LAB — TROPONIN I: Troponin I: 0.01

## 2011-08-04 LAB — LIPASE, BLOOD: Lipase: 18

## 2011-08-04 LAB — CK: Total CK: 67

## 2011-08-04 LAB — ROCKY MTN SPOTTED FVR AB, IGG-BLOOD: RMSF IgG: <1:64 {titer}

## 2011-08-08 ENCOUNTER — Other Ambulatory Visit: Payer: Self-pay | Admitting: Cardiology

## 2011-09-01 ENCOUNTER — Ambulatory Visit (INDEPENDENT_AMBULATORY_CARE_PROVIDER_SITE_OTHER): Payer: Medicare Other | Admitting: *Deleted

## 2011-09-01 DIAGNOSIS — E785 Hyperlipidemia, unspecified: Secondary | ICD-10-CM

## 2011-09-01 DIAGNOSIS — I10 Essential (primary) hypertension: Secondary | ICD-10-CM

## 2011-09-01 LAB — HEPATIC FUNCTION PANEL
ALT: 16 U/L (ref 0–53)
AST: 14 U/L (ref 0–37)
Albumin: 3.9 g/dL (ref 3.5–5.2)
Alkaline Phosphatase: 45 U/L (ref 39–117)
Bilirubin, Direct: 0 mg/dL (ref 0.0–0.3)
Total Bilirubin: 0.7 mg/dL (ref 0.3–1.2)
Total Protein: 6.9 g/dL (ref 6.0–8.3)

## 2011-09-01 LAB — BASIC METABOLIC PANEL
BUN: 16 mg/dL (ref 6–23)
CO2: 28 mEq/L (ref 19–32)
Calcium: 9.3 mg/dL (ref 8.4–10.5)
Chloride: 105 mEq/L (ref 96–112)
Creatinine, Ser: 0.9 mg/dL (ref 0.4–1.5)
GFR: 93.64 mL/min (ref >60.00)
Glucose, Bld: 89 mg/dL (ref 70–99)
Potassium: 3.4 mEq/L — ABNORMAL LOW (ref 3.5–5.1)
Sodium: 139 mEq/L (ref 135–145)

## 2011-09-01 LAB — LIPID PANEL
Cholesterol: 158 mg/dL (ref 0–200)
HDL: 37.6 mg/dL — ABNORMAL LOW (ref >39.00)
LDL Cholesterol: 92 mg/dL (ref 0–99)
Total CHOL/HDL Ratio: 4
Triglycerides: 143 mg/dL (ref 0.0–149.0)
VLDL: 28.6 mg/dL (ref 0.0–40.0)

## 2011-09-05 ENCOUNTER — Telehealth: Payer: Self-pay | Admitting: *Deleted

## 2011-09-05 ENCOUNTER — Telehealth: Payer: Self-pay | Admitting: Cardiology

## 2011-09-05 MED ORDER — POTASSIUM CHLORIDE 10 MEQ PO TBCR: EXTENDED_RELEASE_TABLET | ORAL | Status: DC

## 2011-09-05 NOTE — Telephone Encounter (Signed)
Sent refill for [facility] taking 30 meq daily.

## 2011-09-05 NOTE — Telephone Encounter (Signed)
New Message:  Just spoke to you regarding labs now they will need new pres called into.  [facility] Number [phone].

## 2011-09-05 NOTE — Telephone Encounter (Signed)
Lm w/wife with lab results. Will increase KCL to 30 meq daily. Will send copy to Dr. Vear Clock.

## 2011-09-09 ENCOUNTER — Other Ambulatory Visit: Payer: Self-pay | Admitting: Cardiology

## 2011-11-06 ENCOUNTER — Other Ambulatory Visit: Payer: Self-pay | Admitting: *Deleted

## 2011-11-08 ENCOUNTER — Other Ambulatory Visit: Payer: Self-pay | Admitting: *Deleted

## 2011-11-08 MED ORDER — HYDROCHLOROTHIAZIDE 25 MG PO TABS: 25.0000 mg | ORAL_TABLET | Freq: Every day | ORAL | Status: DC

## 2011-11-27 ENCOUNTER — Other Ambulatory Visit: Payer: Self-pay | Admitting: Orthopaedic Surgery

## 2011-11-27 ENCOUNTER — Ambulatory Visit
Admission: RE | Admit: 2011-11-27 | Discharge: 2011-11-27 | Disposition: A | Discharge status: Home / Self Care | Payer: Medicare Other | Source: Ambulatory Visit | Attending: Orthopaedic Surgery | Admitting: Orthopaedic Surgery

## 2011-11-27 DIAGNOSIS — M47817 Spondylosis without myelopathy or radiculopathy, lumbosacral region: Secondary | ICD-10-CM | POA: Diagnosis not present

## 2011-11-27 DIAGNOSIS — M5137 Other intervertebral disc degeneration, lumbosacral region: Secondary | ICD-10-CM | POA: Diagnosis not present

## 2011-11-27 DIAGNOSIS — M5126 Other intervertebral disc displacement, lumbar region: Secondary | ICD-10-CM | POA: Diagnosis not present

## 2011-11-27 DIAGNOSIS — M25559 Pain in unspecified hip: Secondary | ICD-10-CM | POA: Diagnosis not present

## 2011-11-27 DIAGNOSIS — Z1389 Encounter for screening for other disorder: Secondary | ICD-10-CM | POA: Diagnosis not present

## 2011-11-27 DIAGNOSIS — M25519 Pain in unspecified shoulder: Secondary | ICD-10-CM | POA: Diagnosis not present

## 2011-11-27 DIAGNOSIS — Z139 Encounter for screening, unspecified: Secondary | ICD-10-CM

## 2011-11-27 DIAGNOSIS — M545 Low back pain: Secondary | ICD-10-CM

## 2011-12-01 DIAGNOSIS — M25559 Pain in unspecified hip: Secondary | ICD-10-CM | POA: Diagnosis not present

## 2011-12-06 DIAGNOSIS — IMO0002 Reserved for concepts with insufficient information to code with codable children: Secondary | ICD-10-CM | POA: Diagnosis not present

## 2011-12-06 DIAGNOSIS — M48061 Spinal stenosis, lumbar region without neurogenic claudication: Secondary | ICD-10-CM | POA: Diagnosis not present

## 2012-01-02 DIAGNOSIS — M545 Low back pain: Secondary | ICD-10-CM | POA: Diagnosis not present

## 2012-01-02 DIAGNOSIS — M5137 Other intervertebral disc degeneration, lumbosacral region: Secondary | ICD-10-CM | POA: Diagnosis not present

## 2012-01-04 ENCOUNTER — Ambulatory Visit (INDEPENDENT_AMBULATORY_CARE_PROVIDER_SITE_OTHER): Payer: Medicare Other | Admitting: *Deleted

## 2012-01-04 DIAGNOSIS — M25669 Stiffness of unspecified knee, not elsewhere classified: Secondary | ICD-10-CM | POA: Diagnosis not present

## 2012-01-04 DIAGNOSIS — M629 Disorder of muscle, unspecified: Secondary | ICD-10-CM | POA: Diagnosis not present

## 2012-01-04 DIAGNOSIS — E785 Hyperlipidemia, unspecified: Secondary | ICD-10-CM

## 2012-01-04 DIAGNOSIS — I10 Essential (primary) hypertension: Secondary | ICD-10-CM

## 2012-01-04 DIAGNOSIS — M7500 Adhesive capsulitis of unspecified shoulder: Secondary | ICD-10-CM | POA: Diagnosis not present

## 2012-01-04 LAB — BASIC METABOLIC PANEL
BUN: 17 mg/dL (ref 6–23)
CO2: 26 mEq/L (ref 19–32)
Calcium: 9.3 mg/dL (ref 8.4–10.5)
Chloride: 105 mEq/L (ref 96–112)
Creatinine, Ser: 0.9 mg/dL (ref 0.4–1.5)
GFR: 91.1 mL/min (ref >60.00)
Glucose, Bld: 90 mg/dL (ref 70–99)
Potassium: 3.2 mEq/L — ABNORMAL LOW (ref 3.5–5.1)
Sodium: 140 mEq/L (ref 135–145)

## 2012-01-04 LAB — HEPATIC FUNCTION PANEL
ALT: 18 U/L (ref 0–53)
AST: 16 U/L (ref 0–37)
Albumin: 4.2 g/dL (ref 3.5–5.2)
Alkaline Phosphatase: 41 U/L (ref 39–117)
Bilirubin, Direct: 0.1 mg/dL (ref 0.0–0.3)
Total Bilirubin: 0.4 mg/dL (ref 0.3–1.2)
Total Protein: 7.1 g/dL (ref 6.0–8.3)

## 2012-01-04 LAB — LIPID PANEL
Cholesterol: 180 mg/dL (ref 0–200)
HDL: 46.5 mg/dL (ref >39.00)
LDL Cholesterol: 106 mg/dL — ABNORMAL HIGH (ref 0–99)
Total CHOL/HDL Ratio: 4
Triglycerides: 136 mg/dL (ref 0.0–149.0)
VLDL: 27.2 mg/dL (ref 0.0–40.0)

## 2012-01-09 ENCOUNTER — Other Ambulatory Visit: Payer: Medicare Other

## 2012-01-10 ENCOUNTER — Ambulatory Visit: Payer: Medicare Other | Admitting: Cardiology

## 2012-01-10 DIAGNOSIS — M7500 Adhesive capsulitis of unspecified shoulder: Secondary | ICD-10-CM | POA: Diagnosis not present

## 2012-01-10 DIAGNOSIS — M629 Disorder of muscle, unspecified: Secondary | ICD-10-CM | POA: Diagnosis not present

## 2012-01-10 DIAGNOSIS — M25669 Stiffness of unspecified knee, not elsewhere classified: Secondary | ICD-10-CM | POA: Diagnosis not present

## 2012-01-12 ENCOUNTER — Encounter: Payer: Self-pay | Admitting: Nurse Practitioner

## 2012-01-12 ENCOUNTER — Ambulatory Visit (INDEPENDENT_AMBULATORY_CARE_PROVIDER_SITE_OTHER): Payer: Medicare Other | Admitting: Nurse Practitioner

## 2012-01-12 VITALS — BP 100/78 | HR 70 | Ht 68.5 in | Wt 199.0 lb

## 2012-01-12 DIAGNOSIS — M25669 Stiffness of unspecified knee, not elsewhere classified: Secondary | ICD-10-CM | POA: Diagnosis not present

## 2012-01-12 DIAGNOSIS — E785 Hyperlipidemia, unspecified: Secondary | ICD-10-CM

## 2012-01-12 DIAGNOSIS — M7500 Adhesive capsulitis of unspecified shoulder: Secondary | ICD-10-CM | POA: Diagnosis not present

## 2012-01-12 DIAGNOSIS — M629 Disorder of muscle, unspecified: Secondary | ICD-10-CM | POA: Diagnosis not present

## 2012-01-12 DIAGNOSIS — I10 Essential (primary) hypertension: Secondary | ICD-10-CM | POA: Diagnosis not present

## 2012-01-12 MED ORDER — SIMVASTATIN 10 MG PO TABS: 10.0000 mg | ORAL_TABLET | ORAL | Status: DC

## 2012-01-12 NOTE — Assessment & Plan Note (Signed)
He wants to try cutting the Zocor back. I will let him try every other day. I think he has more issues just because of his arthritis. We will recheck his labs in 4 months. Patient is agreeable to this plan and will call if any problems develop in the interim.

## 2012-01-12 NOTE — Progress Notes (Signed)
Adrian Davis Date of Birth: August 12, 1941 Medical Record #[id number]  History of Present Illness: Mr. Adrian Davis is seen today for his 6 month visit. He is seen for Dr. [place]. He has a history of HTN and HLD. No known CAD. He had a normal Adenosine back in 2008. His last echo was in January of 2012 showing grade 1 diastolic dysfunction, mild LVH and a normal EF.   He comes in today. He is here with his wife. He is doing well. No chest pain. Not short of breath. Remains active. He had cut his potassium back due to a recent trip. His labs last week showed his potassium to be a little low and he said he would go back on his 3 tablets a day. He is tolerating his medicines but wants to cut back his Zocor. Says he aches all over. Does have significant arthritis. Also with frequent urination. Probably has some degree of BPH. Blood pressure at home is running a little higher than here. Overall, he seems to be ok.   Current Outpatient Prescriptions on File Prior to Visit  Medication Sig Dispense Refill  . acetaminophen (TYLENOL) 650 MG CR tablet Take 650 mg by mouth daily.       Marland Kitchen amLODipine (NORVASC) 5 MG tablet Take 7.5 mg by mouth daily.       Marland Kitchen aspirin 81 MG tablet Take 81 mg by mouth daily. coated      . benazepril (LOTENSIN) 20 MG tablet TAKE 1 TABLET TWICE DAILY  60 tablet  5  . hydrochlorothiazide (HYDRODIURIL) 25 MG tablet Take 1 tablet (25 mg total) by mouth daily.  30 tablet  5  . ibuprofen (ADVIL,MOTRIN) 200 MG tablet Take 200 mg by mouth daily.        . ranitidine (ZANTAC) 300 MG tablet TAKE 1 TABLET EVERY DAY  30 tablet  5  . simvastatin (ZOCOR) 10 MG tablet TAKE 1 TABLET BY MOUTH EVERY EVENING  30 tablet  5    No Known Allergies  Past Medical History  Diagnosis Date  . Hyperlipidemia   . Hypertension   . Degenerative disk disease   . Bell palsy Jan 2012    presented with facial numbness  . Edema   . Normal nuclear stress test 2008  . Abnormal echocardiogram Jan 2012    grade 1  diastolic dysfunction, normal EF, mild LVH    Past Surgical History  Procedure Date  . Shoulder surgery     LEFT  . Tonsillectomy   . Knee arthroscopy     RIGHT    History  Smoking status  . Former Smoker -- 1.5 packs/day for 10 years  . Types: Cigarettes  . Quit date: 05/15/1976  Smokeless tobacco  . Current User  . Types: Chew    History  Alcohol Use No    Family History  Problem Relation Age of Onset  . Heart attack Father   . Coronary artery disease Father   . Hypertension Father     Review of Systems: The review of systems is per the HPI.  All other systems were reviewed and are negative.  Physical Exam: BP 100/78  Pulse 70  Ht 5' 8.5" (1.74 m)  Wt 199 lb (90.266 kg)  BMI 29.82 kg/m2 Patient is very pleasant and in no acute distress. Skin is warm and dry. Color is normal.  HEENT is unremarkable. Normocephalic/atraumatic. PERRL. Sclera are nonicteric. Neck is supple. No masses. No JVD. Lungs are clear. Cardiac exam  shows a regular rate and rhythm. Abdomen is soft. Extremities are without edema. Gait and ROM are intact. No gross neurologic deficits noted.    Lab Results  Component Value Date   WBC 7.5 11/12/2010   HGB 15.0 11/12/2010   HCT 44.0 11/12/2010   PLT 223 11/12/2010   GLUCOSE 90 01/04/2012   CHOL 180 01/04/2012   TRIG 136.0 01/04/2012   HDL 46.50 01/04/2012   LDLDIRECT 102.0 05/15/2011   LDLCALC 106* 01/04/2012   ALT 18 01/04/2012   AST 16 01/04/2012   NA 140 01/04/2012   K 3.2* 01/04/2012   CL 105 01/04/2012   CREATININE 0.9 01/04/2012   BUN 17 01/04/2012   CO2 26 01/04/2012   PSA 0.72 05/15/2011   INR 1.01 11/12/2010   HGBA1C  Value: 5.7 (NOTE)                                                                       According to the ADA Clinical Practice Recommendations for 2011, when HbA1c is used as a screening test:   >=6.5%   Diagnostic of Diabetes Mellitus           (if abnormal result  is confirmed)  5.7-6.4%   Increased risk of developing Diabetes  [place]  References:Diagnosis and Classification of Diabetes Mellitus,Diabetes Care,2011,34(Suppl 1):S62-S69 and Standards of Medical Care in         Diabetes - 2011,Diabetes Care,2011,34  (Suppl 1):S11-S61.* 11/12/2010     Assessment / Plan:

## 2012-01-12 NOTE — Assessment & Plan Note (Signed)
Blood pressure looks good. I have left him on his current regimen. He will continue to monitor at home.

## 2012-01-12 NOTE — Patient Instructions (Addendum)
Stay on your current medicines. Except you may cut the Zocor back to just every other day.  Stay active.  We will see you back in 4 months with fasting labs.  Call the [facility] office at [phone] if you have any questions, problems or concerns.

## 2012-01-17 DIAGNOSIS — IMO0002 Reserved for concepts with insufficient information to code with codable children: Secondary | ICD-10-CM | POA: Diagnosis not present

## 2012-01-17 DIAGNOSIS — M48061 Spinal stenosis, lumbar region without neurogenic claudication: Secondary | ICD-10-CM | POA: Diagnosis not present

## 2012-01-18 DIAGNOSIS — M25669 Stiffness of unspecified knee, not elsewhere classified: Secondary | ICD-10-CM | POA: Diagnosis not present

## 2012-01-18 DIAGNOSIS — M7500 Adhesive capsulitis of unspecified shoulder: Secondary | ICD-10-CM | POA: Diagnosis not present

## 2012-01-18 DIAGNOSIS — M629 Disorder of muscle, unspecified: Secondary | ICD-10-CM | POA: Diagnosis not present

## 2012-01-24 DIAGNOSIS — M25669 Stiffness of unspecified knee, not elsewhere classified: Secondary | ICD-10-CM | POA: Diagnosis not present

## 2012-01-24 DIAGNOSIS — M629 Disorder of muscle, unspecified: Secondary | ICD-10-CM | POA: Diagnosis not present

## 2012-01-24 DIAGNOSIS — M7500 Adhesive capsulitis of unspecified shoulder: Secondary | ICD-10-CM | POA: Diagnosis not present

## 2012-01-29 ENCOUNTER — Other Ambulatory Visit: Payer: Self-pay | Admitting: *Deleted

## 2012-01-29 DIAGNOSIS — M25669 Stiffness of unspecified knee, not elsewhere classified: Secondary | ICD-10-CM | POA: Diagnosis not present

## 2012-01-29 DIAGNOSIS — M7500 Adhesive capsulitis of unspecified shoulder: Secondary | ICD-10-CM | POA: Diagnosis not present

## 2012-01-29 DIAGNOSIS — M629 Disorder of muscle, unspecified: Secondary | ICD-10-CM | POA: Diagnosis not present

## 2012-01-29 MED ORDER — SIMVASTATIN 10 MG PO TABS: 10.0000 mg | ORAL_TABLET | ORAL | Status: DC

## 2012-01-29 MED ORDER — BENAZEPRIL HCL 20 MG PO TABS: 20.0000 mg | ORAL_TABLET | Freq: Two times a day (BID) | ORAL | Status: DC

## 2012-01-31 DIAGNOSIS — M25669 Stiffness of unspecified knee, not elsewhere classified: Secondary | ICD-10-CM | POA: Diagnosis not present

## 2012-01-31 DIAGNOSIS — M7500 Adhesive capsulitis of unspecified shoulder: Secondary | ICD-10-CM | POA: Diagnosis not present

## 2012-01-31 DIAGNOSIS — M629 Disorder of muscle, unspecified: Secondary | ICD-10-CM | POA: Diagnosis not present

## 2012-03-04 DIAGNOSIS — L570 Actinic keratosis: Secondary | ICD-10-CM | POA: Diagnosis not present

## 2012-03-04 DIAGNOSIS — D235 Other benign neoplasm of skin of trunk: Secondary | ICD-10-CM | POA: Diagnosis not present

## 2012-03-04 DIAGNOSIS — K130 Diseases of lips: Secondary | ICD-10-CM | POA: Diagnosis not present

## 2012-03-25 ENCOUNTER — Other Ambulatory Visit: Payer: Self-pay | Admitting: Cardiology

## 2012-03-25 MED ORDER — RANITIDINE HCL 300 MG PO TABS: 300.0000 mg | ORAL_TABLET | Freq: Two times a day (BID) | ORAL | Status: DC

## 2012-03-27 ENCOUNTER — Other Ambulatory Visit: Payer: Self-pay | Admitting: Cardiology

## 2012-03-27 MED ORDER — AMLODIPINE BESYLATE 5 MG PO TABS: 7.5000 mg | ORAL_TABLET | Freq: Every day | ORAL | Status: DC

## 2012-03-27 NOTE — Telephone Encounter (Signed)
Per pt spouse pt needs refill on medication mentioned above and he is almost out and Pharm has sent authorization and we have not sent a respond

## 2012-04-02 ENCOUNTER — Other Ambulatory Visit: Payer: Self-pay

## 2012-04-03 DIAGNOSIS — L570 Actinic keratosis: Secondary | ICD-10-CM | POA: Diagnosis not present

## 2012-04-03 DIAGNOSIS — L820 Inflamed seborrheic keratosis: Secondary | ICD-10-CM | POA: Diagnosis not present

## 2012-05-17 ENCOUNTER — Ambulatory Visit (INDEPENDENT_AMBULATORY_CARE_PROVIDER_SITE_OTHER): Payer: Medicare Other | Admitting: Cardiology

## 2012-05-17 ENCOUNTER — Encounter: Payer: Self-pay | Admitting: Cardiology

## 2012-05-17 VITALS — BP 121/78 | HR 62 | Ht 69.5 in | Wt 195.0 lb

## 2012-05-17 DIAGNOSIS — E785 Hyperlipidemia, unspecified: Secondary | ICD-10-CM

## 2012-05-17 DIAGNOSIS — I10 Essential (primary) hypertension: Secondary | ICD-10-CM | POA: Diagnosis not present

## 2012-05-17 NOTE — Patient Instructions (Signed)
Continue your current therapy  I will see you again in 6 months with fasting lab work   

## 2012-05-19 NOTE — Assessment & Plan Note (Signed)
Blood pressure is well controlled on combination of amlodipine, HCTZ, and benazepril. We will continue with his current therapy.

## 2012-05-19 NOTE — Assessment & Plan Note (Signed)
His last lipid parameters were satisfactory on simvastatin 10 mg daily. We will plan on followup again in 6 months and check fasting lab work at that time.

## 2012-05-19 NOTE — Progress Notes (Signed)
Adrian Davis Date of Birth: [birth year] Medical Record #[id number]  History of Present Illness: Adrian Davis is seen today for a followup visit. He has a history of HTN and HLD. No known CAD. He had a normal Adenosine back in 2008. His last echo was in January of 2012 showing grade 1 diastolic dysfunction, mild LVH and a normal EF. On followup today he reports that he is doing very well. His major complaint is of arthralgias. He simply aches a lot. He has had 2 epidurals since his last visit and states that the second one really seem to help. He denies any chest pain, dyspnea, or edema.  Current Outpatient Prescriptions on File Prior to Visit  Medication Sig Dispense Refill  . acetaminophen (TYLENOL) 650 MG CR tablet Take 650 mg by mouth daily.       Marland Kitchen amLODipine (NORVASC) 5 MG tablet Take 1.5 tablets (7.5 mg total) by mouth daily.  45 tablet  3  . aspirin 81 MG tablet Take 81 mg by mouth daily. coated      . benazepril (LOTENSIN) 20 MG tablet Take 1 tablet (20 mg total) by mouth 2 (two) times daily.  60 tablet  5  . fish oil-omega-3 fatty acids 1000 MG capsule Take 1 g by mouth daily.      . hydrochlorothiazide (HYDRODIURIL) 25 MG tablet Take 1 tablet (25 mg total) by mouth daily.  30 tablet  5  . ibuprofen (ADVIL,MOTRIN) 200 MG tablet Take 200 mg by mouth daily.        . potassium chloride (KLOR-CON) 10 MEQ CR tablet Take 30 mEq by mouth daily. Take 3 tablets daily      . ranitidine (ZANTAC) 300 MG tablet Take 1 tablet (300 mg total) by mouth 2 (two) times daily.  30 tablet  0  . simvastatin (ZOCOR) 10 MG tablet Take 1 tablet (10 mg total) by mouth every other day.  30 tablet  5    No Known Allergies  Past Medical History  Diagnosis Date  . Hyperlipidemia   . Hypertension   . Degenerative disk disease   . Bell palsy Jan 2012    presented with facial numbness  . Edema   . Normal nuclear stress test 2008  . Abnormal echocardiogram Jan 2012    grade 1 diastolic dysfunction, normal EF,  mild LVH    Past Surgical History  Procedure Date  . Shoulder surgery     LEFT  . Tonsillectomy   . Knee arthroscopy     RIGHT    History  Smoking status  . Former Smoker -- 1.5 packs/day for 10 years  . Types: Cigarettes  . Quit date: 05/15/1976  Smokeless tobacco  . Current User  . Types: Chew    History  Alcohol Use No    Family History  Problem Relation Age of Onset  . Heart attack Father   . Coronary artery disease Father   . Hypertension Father     Review of Systems: The review of systems is per the HPI.  All other systems were reviewed and are negative.  Physical Exam: BP 121/78  Pulse 62  Ht 5' 9.5" (1.765 m)  Wt 195 lb (88.451 kg)  BMI 28.38 kg/m2 Patient is very pleasant and in no acute distress. Skin is warm and dry. Color is normal.  HEENT is unremarkable. Normocephalic/atraumatic. PERRL. Sclera are nonicteric. Neck is supple. No masses. No JVD. Lungs are clear. Cardiac exam shows a regular rate and rhythm.  Abdomen is soft. Extremities are without edema. Gait and ROM are intact. No gross neurologic deficits noted.   Laboratory data: ECG today demonstrates normal sinus rhythm with very minor nonspecific ST abnormality.   Assessment / Plan:

## 2012-06-11 ENCOUNTER — Other Ambulatory Visit: Payer: Self-pay

## 2012-06-11 MED ORDER — AMLODIPINE BESYLATE 2.5 MG PO TABS: ORAL_TABLET | ORAL | Status: DC

## 2012-07-12 ENCOUNTER — Telehealth: Payer: Self-pay | Admitting: Cardiology

## 2012-07-12 MED ORDER — AMLODIPINE BESYLATE 2.5 MG PO TABS: ORAL_TABLET | ORAL | Status: DC

## 2012-07-12 NOTE — Telephone Encounter (Signed)
New PRoblem:    Patient's wife called in needing a her husband's amLODipine (NORVASC) 2.5 MG tablet changed form 100 tablets to 90 tablets because they charge double for medications exceeding 90 tablets.  Please call back if you have any questions.

## 2012-07-12 NOTE — Telephone Encounter (Signed)
Patient called, spoke to wife she stated amlodipine prescription amount called into pharmacy wrong last time.States 5 mg tablets are hard to break.States patient takes 2.5 mg 2 tablets in am and 2.5 mg 1 at night.States wants # 90 tablets.Prescription sent to [facility].

## 2012-07-15 ENCOUNTER — Telehealth: Payer: Self-pay | Admitting: Cardiology

## 2012-07-15 NOTE — Telephone Encounter (Signed)
Pt needs refill zantac 300 mg generic 30 days supply

## 2012-07-15 NOTE — Telephone Encounter (Signed)
Called and left message to call back. Need to send refills for Zantac to PCP

## 2012-08-12 DIAGNOSIS — M48061 Spinal stenosis, lumbar region without neurogenic claudication: Secondary | ICD-10-CM | POA: Diagnosis not present

## 2012-08-12 DIAGNOSIS — IMO0002 Reserved for concepts with insufficient information to code with codable children: Secondary | ICD-10-CM | POA: Diagnosis not present

## 2012-08-14 ENCOUNTER — Other Ambulatory Visit: Payer: Self-pay | Admitting: Cardiology

## 2012-09-08 DIAGNOSIS — Z23 Encounter for immunization: Secondary | ICD-10-CM | POA: Diagnosis not present

## 2012-09-16 ENCOUNTER — Other Ambulatory Visit: Payer: Self-pay

## 2012-09-16 MED ORDER — HYDROCHLOROTHIAZIDE 25 MG PO TABS: 25.0000 mg | ORAL_TABLET | Freq: Every day | ORAL | Status: DC

## 2012-10-11 DIAGNOSIS — M48061 Spinal stenosis, lumbar region without neurogenic claudication: Secondary | ICD-10-CM | POA: Diagnosis not present

## 2012-10-11 DIAGNOSIS — IMO0002 Reserved for concepts with insufficient information to code with codable children: Secondary | ICD-10-CM | POA: Diagnosis not present

## 2012-10-22 ENCOUNTER — Other Ambulatory Visit: Payer: Self-pay

## 2012-10-24 ENCOUNTER — Other Ambulatory Visit: Payer: Self-pay

## 2012-10-24 MED ORDER — POTASSIUM CHLORIDE ER 10 MEQ PO TBCR: 10.0000 meq | EXTENDED_RELEASE_TABLET | Freq: Three times a day (TID) | ORAL | Status: DC

## 2012-10-29 DIAGNOSIS — IMO0002 Reserved for concepts with insufficient information to code with codable children: Secondary | ICD-10-CM | POA: Diagnosis not present

## 2012-10-29 DIAGNOSIS — M48061 Spinal stenosis, lumbar region without neurogenic claudication: Secondary | ICD-10-CM | POA: Diagnosis not present

## 2012-11-11 ENCOUNTER — Other Ambulatory Visit: Payer: Self-pay

## 2012-11-11 DIAGNOSIS — N400 Enlarged prostate without lower urinary tract symptoms: Secondary | ICD-10-CM

## 2012-11-12 ENCOUNTER — Other Ambulatory Visit (INDEPENDENT_AMBULATORY_CARE_PROVIDER_SITE_OTHER): Payer: Medicare Other

## 2012-11-12 ENCOUNTER — Other Ambulatory Visit: Payer: Self-pay | Admitting: *Deleted

## 2012-11-12 DIAGNOSIS — I10 Essential (primary) hypertension: Secondary | ICD-10-CM

## 2012-11-12 DIAGNOSIS — N400 Enlarged prostate without lower urinary tract symptoms: Secondary | ICD-10-CM

## 2012-11-12 DIAGNOSIS — E785 Hyperlipidemia, unspecified: Secondary | ICD-10-CM

## 2012-11-12 LAB — HEPATIC FUNCTION PANEL
ALT: 15 U/L (ref 0–53)
AST: 15 U/L (ref 0–37)
Albumin: 3.6 g/dL (ref 3.5–5.2)
Alkaline Phosphatase: 40 U/L (ref 39–117)
Bilirubin, Direct: 0.1 mg/dL (ref 0.0–0.3)
Total Bilirubin: 0.6 mg/dL (ref 0.3–1.2)
Total Protein: 6.3 g/dL (ref 6.0–8.3)

## 2012-11-12 LAB — LIPID PANEL
Cholesterol: 156 mg/dL (ref 0–200)
HDL: 33 mg/dL — ABNORMAL LOW (ref >39.00)
LDL Cholesterol: 95 mg/dL (ref 0–99)
Total CHOL/HDL Ratio: 5
Triglycerides: 141 mg/dL (ref 0.0–149.0)
VLDL: 28.2 mg/dL (ref 0.0–40.0)

## 2012-11-12 LAB — BASIC METABOLIC PANEL
BUN: 20 mg/dL (ref 6–23)
CO2: 26 mEq/L (ref 19–32)
Calcium: 9.3 mg/dL (ref 8.4–10.5)
Chloride: 108 mEq/L (ref 96–112)
Creatinine, Ser: 1 mg/dL (ref 0.4–1.5)
GFR: 78.41 mL/min (ref >60.00)
Glucose, Bld: 103 mg/dL — ABNORMAL HIGH (ref 70–99)
Potassium: 3.8 mEq/L (ref 3.5–5.1)
Sodium: 138 mEq/L (ref 135–145)

## 2012-11-12 LAB — PSA: PSA: 1.79 ng/mL (ref 0.10–4.00)

## 2012-11-14 ENCOUNTER — Encounter: Payer: Self-pay | Admitting: Cardiology

## 2012-11-14 ENCOUNTER — Ambulatory Visit (INDEPENDENT_AMBULATORY_CARE_PROVIDER_SITE_OTHER): Payer: Medicare Other | Admitting: Cardiology

## 2012-11-14 VITALS — BP 130/80 | HR 72 | Ht 69.5 in | Wt 197.8 lb

## 2012-11-14 DIAGNOSIS — I10 Essential (primary) hypertension: Secondary | ICD-10-CM

## 2012-11-14 DIAGNOSIS — E785 Hyperlipidemia, unspecified: Secondary | ICD-10-CM

## 2012-11-14 NOTE — Progress Notes (Signed)
[name] Date of Birth: [birth year] Medical Record #[id number]  History of Present Illness: Adrian Davis is seen today for a followup visit. He has a history of HTN and HLD. No known CAD. He had a normal Adenosine back in 2008. His last echo was in January of 2012 showing grade 1 diastolic dysfunction, mild LVH and a normal EF. On followup today he reports that he is doing very well. He has been having a lot of back problems with spinal stenosis. He has had 4 epidural injections this year. He is scheduled to see Dr. Lovell Sheehan tomorrow.  Current Outpatient Prescriptions on File Prior to Visit  Medication Sig Dispense Refill  . acetaminophen (TYLENOL) 650 MG CR tablet Take 650 mg by mouth daily.       Marland Kitchen amLODipine (NORVASC) 2.5 MG tablet Take 2 tablets in morning and 1 tablet every night.  90 tablet  6  . aspirin 81 MG tablet Take 81 mg by mouth daily. coated      . benazepril (LOTENSIN) 20 MG tablet TAKE 1 TABLET BY MOUTH 2 TIMES A DAY  60 tablet  5  . fish oil-omega-3 fatty acids 1000 MG capsule Take 1 g by mouth daily.      . hydrochlorothiazide (HYDRODIURIL) 25 MG tablet Take 1 tablet (25 mg total) by mouth daily.  30 tablet  5  . ibuprofen (ADVIL,MOTRIN) 200 MG tablet Take 200 mg by mouth daily.        . potassium chloride (K-DUR) 10 MEQ tablet Take 10 mEq by mouth as directed. 1 tablet in the morning and 2 tablets in evening.      . ranitidine (ZANTAC) 300 MG tablet Take 1 tablet (300 mg total) by mouth 2 (two) times daily.  30 tablet  0  . simvastatin (ZOCOR) 10 MG tablet Take 1 tablet (10 mg total) by mouth every other day.  30 tablet  5    No Known Allergies  Past Medical History  Diagnosis Date  . Hyperlipidemia   . Hypertension   . Degenerative disk disease   . Bell palsy Jan 2012    presented with facial numbness  . Edema   . Normal nuclear stress test 2008  . Abnormal echocardiogram Jan 2012    grade 1 diastolic dysfunction, normal EF, mild LVH  . Lumbar stenosis      Past Surgical History  Procedure Date  . Shoulder surgery     LEFT  . Tonsillectomy   . Knee arthroscopy     RIGHT    History  Smoking status  . Former Smoker -- 1.5 packs/day for 10 years  . Types: Cigarettes  . Quit date: 05/15/1976  Smokeless tobacco  . Current User  . Types: Chew    History  Alcohol Use No    Family History  Problem Relation Age of Onset  . Heart attack Father   . Coronary artery disease Father   . Hypertension Father     Review of Systems: The review of systems is per the HPI.  All other systems were reviewed and are negative.  Physical Exam: BP 130/80  Pulse 72  Ht 5' 9.5" (1.765 m)  Wt 197 lb 12.8 oz (89.721 kg)  BMI 28.79 kg/m2  SpO2 98% Patient is very pleasant and in no acute distress. Skin is warm and dry. Color is normal.  HEENT is unremarkable. Normocephalic/atraumatic. PERRL. Sclera are nonicteric. Neck is supple. No masses. No JVD. Lungs are clear. Cardiac exam shows a  regular rate and rhythm. Abdomen is soft. Extremities are without edema. Gait and ROM are intact. No gross neurologic deficits noted.   Laboratory data: Lab Results  Component Value Date   WBC 7.5 11/12/2010   HGB 15.0 11/12/2010   HCT 44.0 11/12/2010   PLT 223 11/12/2010   GLUCOSE 103* 11/12/2012   CHOL 156 11/12/2012   TRIG 141.0 11/12/2012   HDL 33.00* 11/12/2012   LDLDIRECT 102.0 05/15/2011   LDLCALC 95 11/12/2012   ALT 15 11/12/2012   AST 15 11/12/2012   NA 138 11/12/2012   K 3.8 11/12/2012   CL 108 11/12/2012   CREATININE 1.0 11/12/2012   BUN 20 11/12/2012   CO2 26 11/12/2012   PSA 1.79 11/12/2012   INR 1.01 11/12/2010   HGBA1C  Value: 5.7 (NOTE)                                                                       According to the ADA Clinical Practice Recommendations for 2011, when HbA1c is used as a screening test:   >=6.5%   Diagnostic of Diabetes Mellitus           (if abnormal result  is confirmed)  5.7-6.4%   Increased risk of developing Diabetes Mellitus   References:Diagnosis and Classification of Diabetes Mellitus,Diabetes Care,2011,34(Suppl 1):S62-S69 and Standards of Medical Care in         Diabetes - 2011,Diabetes Care,2011,34  (Suppl 1):S11-S61.* 11/12/2010       Assessment / Plan: 1. HTN well controlled. Continue amlodipine, HCTZ, and lotensin. 2. Hyperlipidemia. Continue low dose statin and fish oil. 3. Lumbar stenosis.

## 2012-11-14 NOTE — Patient Instructions (Signed)
Continue your current therapy  I will see you in 6 months.   

## 2012-11-15 ENCOUNTER — Other Ambulatory Visit: Payer: Self-pay | Admitting: Neurosurgery

## 2012-11-15 DIAGNOSIS — M48061 Spinal stenosis, lumbar region without neurogenic claudication: Secondary | ICD-10-CM | POA: Diagnosis not present

## 2012-11-15 DIAGNOSIS — IMO0002 Reserved for concepts with insufficient information to code with codable children: Secondary | ICD-10-CM | POA: Diagnosis not present

## 2012-11-15 DIAGNOSIS — M549 Dorsalgia, unspecified: Secondary | ICD-10-CM

## 2012-11-15 DIAGNOSIS — M545 Low back pain: Secondary | ICD-10-CM | POA: Diagnosis not present

## 2012-11-19 ENCOUNTER — Ambulatory Visit
Admission: RE | Admit: 2012-11-19 | Discharge: 2012-11-19 | Disposition: A | Discharge status: Home / Self Care | Payer: Medicare Other | Source: Ambulatory Visit | Attending: Neurosurgery | Admitting: Neurosurgery

## 2012-11-19 DIAGNOSIS — M549 Dorsalgia, unspecified: Secondary | ICD-10-CM

## 2012-11-19 DIAGNOSIS — M5137 Other intervertebral disc degeneration, lumbosacral region: Secondary | ICD-10-CM | POA: Diagnosis not present

## 2012-11-19 DIAGNOSIS — M48061 Spinal stenosis, lumbar region without neurogenic claudication: Secondary | ICD-10-CM | POA: Diagnosis not present

## 2012-11-19 DIAGNOSIS — M47817 Spondylosis without myelopathy or radiculopathy, lumbosacral region: Secondary | ICD-10-CM | POA: Diagnosis not present

## 2012-11-19 DIAGNOSIS — Z135 Encounter for screening for eye and ear disorders: Secondary | ICD-10-CM | POA: Diagnosis not present

## 2012-11-20 ENCOUNTER — Other Ambulatory Visit: Payer: Medicare Other

## 2012-11-22 ENCOUNTER — Other Ambulatory Visit: Payer: Medicare Other

## 2012-12-03 DIAGNOSIS — M48061 Spinal stenosis, lumbar region without neurogenic claudication: Secondary | ICD-10-CM | POA: Diagnosis not present

## 2012-12-03 DIAGNOSIS — IMO0002 Reserved for concepts with insufficient information to code with codable children: Secondary | ICD-10-CM | POA: Diagnosis not present

## 2012-12-23 DIAGNOSIS — M48061 Spinal stenosis, lumbar region without neurogenic claudication: Secondary | ICD-10-CM | POA: Diagnosis not present

## 2012-12-23 DIAGNOSIS — IMO0002 Reserved for concepts with insufficient information to code with codable children: Secondary | ICD-10-CM | POA: Diagnosis not present

## 2013-01-13 ENCOUNTER — Other Ambulatory Visit: Payer: Self-pay | Admitting: Cardiology

## 2013-01-23 DIAGNOSIS — M48061 Spinal stenosis, lumbar region without neurogenic claudication: Secondary | ICD-10-CM | POA: Diagnosis not present

## 2013-01-23 DIAGNOSIS — IMO0002 Reserved for concepts with insufficient information to code with codable children: Secondary | ICD-10-CM | POA: Diagnosis not present

## 2013-01-31 ENCOUNTER — Other Ambulatory Visit: Payer: Self-pay

## 2013-01-31 MED ORDER — SIMVASTATIN 10 MG PO TABS: 10.0000 mg | ORAL_TABLET | ORAL | Status: DC

## 2013-02-09 ENCOUNTER — Other Ambulatory Visit: Payer: Self-pay | Admitting: Cardiology

## 2013-02-10 ENCOUNTER — Other Ambulatory Visit: Payer: Self-pay | Admitting: Cardiology

## 2013-02-11 ENCOUNTER — Telehealth: Payer: Self-pay

## 2013-02-11 NOTE — Telephone Encounter (Signed)
Called the pharmacy and verified that patient does not need refills on Klor con and Lotensin at this time

## 2013-02-12 ENCOUNTER — Other Ambulatory Visit: Payer: Self-pay | Admitting: *Deleted

## 2013-02-12 ENCOUNTER — Other Ambulatory Visit: Payer: Self-pay | Admitting: Cardiology

## 2013-02-14 ENCOUNTER — Other Ambulatory Visit: Payer: Self-pay

## 2013-02-14 MED ORDER — AMLODIPINE BESYLATE 2.5 MG PO TABS: ORAL_TABLET | ORAL | Status: DC

## 2013-03-06 DIAGNOSIS — M48061 Spinal stenosis, lumbar region without neurogenic claudication: Secondary | ICD-10-CM | POA: Diagnosis not present

## 2013-03-06 DIAGNOSIS — IMO0002 Reserved for concepts with insufficient information to code with codable children: Secondary | ICD-10-CM | POA: Diagnosis not present

## 2013-04-01 DIAGNOSIS — M549 Dorsalgia, unspecified: Secondary | ICD-10-CM | POA: Diagnosis not present

## 2013-04-01 DIAGNOSIS — Q762 Congenital spondylolisthesis: Secondary | ICD-10-CM | POA: Diagnosis not present

## 2013-05-13 ENCOUNTER — Ambulatory Visit (INDEPENDENT_AMBULATORY_CARE_PROVIDER_SITE_OTHER): Payer: Medicare Other | Admitting: Cardiology

## 2013-05-13 ENCOUNTER — Encounter: Payer: Self-pay | Admitting: Cardiology

## 2013-05-13 VITALS — BP 130/76 | HR 66 | Ht 69.5 in | Wt 194.8 lb

## 2013-05-13 DIAGNOSIS — E785 Hyperlipidemia, unspecified: Secondary | ICD-10-CM

## 2013-05-13 DIAGNOSIS — I10 Essential (primary) hypertension: Secondary | ICD-10-CM | POA: Diagnosis not present

## 2013-05-13 NOTE — Patient Instructions (Signed)
Continue your current therapy  I will see you in 6 months with fasting lab.

## 2013-05-13 NOTE — Progress Notes (Signed)
Adrian Davis Date of Birth: 1941-02-18 Medical Record #[id number]  History of Present Illness: Adrian Davis is seen today for a followup visit. He has a history of HTN and HLD. No known CAD. He had a normal Adenosine back in 2008. His last echo was in January of 2012 showing grade 1 diastolic dysfunction, mild LVH and a normal EF. He has been evaluated by Dr. Lovell Sheehan for lumbar stenosis. He has received epidural injections without sustained relief. He is being considered for surgery. He denies any chest pain or shortness of breath. He otherwise feels well.  Current Outpatient Prescriptions on File Prior to Visit  Medication Sig Dispense Refill  . acetaminophen (TYLENOL) 650 MG CR tablet Take 650 mg by mouth daily.       Marland Kitchen amLODipine (NORVASC) 2.5 MG tablet Take 2 tablets in morning and 1 tablet every night.  90 tablet  6  . aspirin 81 MG tablet Take 81 mg by mouth daily. coated      . benazepril (LOTENSIN) 20 MG tablet TAKE 1 TABLET BY MOUTH 2 TIMES A DAY  60 tablet  5  . fish oil-omega-3 fatty acids 1000 MG capsule Take 1 g by mouth daily.      . hydrochlorothiazide (HYDRODIURIL) 25 MG tablet Take 1 tablet (25 mg total) by mouth daily.  30 tablet  5  . ibuprofen (ADVIL,MOTRIN) 200 MG tablet Take 200 mg by mouth daily.        Marland Kitchen KLOR-CON M10 10 MEQ tablet TAKE 1 TABLET (10 MEQ TOTAL) BY MOUTH 3 (THREE) TIMES DAILY.  90 tablet  3  . ranitidine (ZANTAC) 300 MG tablet Take 1 tablet (300 mg total) by mouth 2 (two) times daily.  30 tablet  0  . simvastatin (ZOCOR) 10 MG tablet Take 1 tablet (10 mg total) by mouth every other day.  30 tablet  11   No current facility-administered medications on file prior to visit.    No Known Allergies  Past Medical History  Diagnosis Date  . Hyperlipidemia   . Hypertension   . Degenerative disk disease   . Bell palsy Jan 2012    presented with facial numbness  . Edema   . Normal nuclear stress test 2008  . Abnormal echocardiogram Jan 2012    grade 1  diastolic dysfunction, normal EF, mild LVH  . Lumbar stenosis     Past Surgical History  Procedure Laterality Date  . Shoulder surgery      LEFT  . Tonsillectomy    . Knee arthroscopy      RIGHT    History  Smoking status  . Former Smoker -- 1.50 packs/day for 10 years  . Types: Cigarettes  . Quit date: 05/15/1976  Smokeless tobacco  . Current User  . Types: Chew    History  Alcohol Use No    Family History  Problem Relation Age of Onset  . Heart attack Father   . Coronary artery disease Father   . Hypertension Father     Review of Systems: The review of systems is per the HPI.  All other systems were reviewed and are negative.  Physical Exam: BP 130/76  Pulse 66  Ht 5' 9.5" (1.765 m)  Wt 194 lb 12.8 oz (88.361 kg)  BMI 28.36 kg/m2  SpO2 96% Patient is very pleasant and in no acute distress. Skin is warm and dry. Color is normal.  HEENT is unremarkable. Normocephalic/atraumatic. PERRL. Sclera are nonicteric. Neck is supple. No masses. No  JVD. Lungs are clear. Cardiac exam shows a regular rate and rhythm. Abdomen is soft. Extremities are without edema. Gait and ROM are intact. No gross neurologic deficits noted.   Laboratory data: Lab Results  Component Value Date   WBC 7.5 11/12/2010   HGB 15.0 11/12/2010   HCT 44.0 11/12/2010   PLT 223 11/12/2010   GLUCOSE 103* 11/12/2012   CHOL 156 11/12/2012   TRIG 141.0 11/12/2012   HDL 33.00* 11/12/2012   LDLDIRECT 102.0 05/15/2011   LDLCALC 95 11/12/2012   ALT 15 11/12/2012   AST 15 11/12/2012   NA 138 11/12/2012   K 3.8 11/12/2012   CL 108 11/12/2012   CREATININE 1.0 11/12/2012   BUN 20 11/12/2012   CO2 26 11/12/2012   PSA 1.79 11/12/2012   INR 1.01 11/12/2010   HGBA1C  Value: 5.7 (NOTE)                                                                       According to the ADA Clinical Practice Recommendations for 2011, when HbA1c is used as a screening test:   >=6.5%   Diagnostic of Diabetes Mellitus           (if abnormal  result  is confirmed)  5.7-6.4%   Increased risk of developing Diabetes Mellitus  References:Diagnosis and Classification of Diabetes Mellitus,Diabetes Care,2011,34(Suppl 1):S62-S69 and Standards of Medical Care in         Diabetes - 2011,Diabetes Care,2011,34  (Suppl 1):S11-S61.* 11/12/2010       Assessment / Plan: 1. HTN well controlled. Continue amlodipine, HCTZ, and lotensin. 2. Hyperlipidemia. Continue low dose statin and fish oil. 3. Lumbar stenosis. Considering surgical options. If needed he is cleared for surgery from a cardiac standpoint.

## 2013-05-22 ENCOUNTER — Other Ambulatory Visit: Payer: Self-pay | Admitting: Cardiology

## 2013-06-09 DIAGNOSIS — M48061 Spinal stenosis, lumbar region without neurogenic claudication: Secondary | ICD-10-CM | POA: Diagnosis not present

## 2013-06-09 DIAGNOSIS — IMO0002 Reserved for concepts with insufficient information to code with codable children: Secondary | ICD-10-CM | POA: Diagnosis not present

## 2013-08-23 ENCOUNTER — Other Ambulatory Visit: Payer: Self-pay | Admitting: Cardiology

## 2013-08-26 ENCOUNTER — Other Ambulatory Visit: Payer: Self-pay

## 2013-08-26 MED ORDER — HYDROCHLOROTHIAZIDE 25 MG PO TABS: 25.0000 mg | ORAL_TABLET | Freq: Every day | ORAL | Status: DC

## 2013-09-04 DIAGNOSIS — Z23 Encounter for immunization: Secondary | ICD-10-CM | POA: Diagnosis not present

## 2013-09-11 DIAGNOSIS — M1710 Unilateral primary osteoarthritis, unspecified knee: Secondary | ICD-10-CM | POA: Diagnosis not present

## 2013-09-12 DIAGNOSIS — E663 Overweight: Secondary | ICD-10-CM | POA: Diagnosis not present

## 2013-09-12 DIAGNOSIS — M549 Dorsalgia, unspecified: Secondary | ICD-10-CM | POA: Diagnosis not present

## 2013-09-12 DIAGNOSIS — M48061 Spinal stenosis, lumbar region without neurogenic claudication: Secondary | ICD-10-CM | POA: Diagnosis not present

## 2013-09-16 ENCOUNTER — Other Ambulatory Visit: Payer: Self-pay | Admitting: Cardiology

## 2013-09-25 ENCOUNTER — Other Ambulatory Visit: Payer: Self-pay | Admitting: Neurosurgery

## 2013-10-06 ENCOUNTER — Telehealth: Payer: Self-pay | Admitting: Cardiology

## 2013-10-06 NOTE — Telephone Encounter (Signed)
New Message  Pt wife called---- states she has received an automated message// No appt is scheduled for this pt that would normally generate a automated message.

## 2013-10-20 DIAGNOSIS — J019 Acute sinusitis, unspecified: Secondary | ICD-10-CM | POA: Diagnosis not present

## 2013-10-20 DIAGNOSIS — H6690 Otitis media, unspecified, unspecified ear: Secondary | ICD-10-CM | POA: Diagnosis not present

## 2013-10-28 ENCOUNTER — Other Ambulatory Visit: Payer: Self-pay | Admitting: Cardiology

## 2013-10-29 ENCOUNTER — Encounter (HOSPITAL_COMMUNITY): Payer: Self-pay | Admitting: Pharmacy Technician

## 2013-10-29 ENCOUNTER — Other Ambulatory Visit: Payer: Self-pay

## 2013-10-29 MED ORDER — POTASSIUM CHLORIDE CRYS ER 10 MEQ PO TBCR: 10.0000 meq | EXTENDED_RELEASE_TABLET | Freq: Two times a day (BID) | ORAL | Status: DC

## 2013-10-29 MED ORDER — AMLODIPINE BESYLATE 2.5 MG PO TABS: ORAL_TABLET | ORAL | Status: DC

## 2013-10-30 ENCOUNTER — Other Ambulatory Visit: Payer: Self-pay

## 2013-10-30 DIAGNOSIS — E785 Hyperlipidemia, unspecified: Secondary | ICD-10-CM

## 2013-10-30 DIAGNOSIS — I10 Essential (primary) hypertension: Secondary | ICD-10-CM

## 2013-10-31 ENCOUNTER — Encounter: Payer: Self-pay | Admitting: Cardiology

## 2013-10-31 ENCOUNTER — Other Ambulatory Visit (INDEPENDENT_AMBULATORY_CARE_PROVIDER_SITE_OTHER): Payer: Medicare Other

## 2013-10-31 DIAGNOSIS — E785 Hyperlipidemia, unspecified: Secondary | ICD-10-CM | POA: Diagnosis not present

## 2013-10-31 DIAGNOSIS — I10 Essential (primary) hypertension: Secondary | ICD-10-CM

## 2013-10-31 LAB — HEPATIC FUNCTION PANEL
ALT: 21 U/L (ref 0–53)
AST: 17 U/L (ref 0–37)
Albumin: 4.1 g/dL (ref 3.5–5.2)
Alkaline Phosphatase: 42 U/L (ref 39–117)
Bilirubin, Direct: 0 mg/dL (ref 0.0–0.3)
Total Bilirubin: 0.6 mg/dL (ref 0.3–1.2)
Total Protein: 6.8 g/dL (ref 6.0–8.3)

## 2013-10-31 LAB — LIPID PANEL
Cholesterol: 179 mg/dL (ref 0–200)
HDL: 37.5 mg/dL — ABNORMAL LOW (ref >39.00)
LDL Cholesterol: 120 mg/dL — ABNORMAL HIGH (ref 0–99)
Total CHOL/HDL Ratio: 5
Triglycerides: 110 mg/dL (ref 0.0–149.0)
VLDL: 22 mg/dL (ref 0.0–40.0)

## 2013-10-31 LAB — BASIC METABOLIC PANEL
BUN: 18 mg/dL (ref 6–23)
CO2: 26 mEq/L (ref 19–32)
Calcium: 9.4 mg/dL (ref 8.4–10.5)
Chloride: 106 mEq/L (ref 96–112)
Creatinine, Ser: 1 mg/dL (ref 0.4–1.5)
GFR: 78.19 mL/min (ref >60.00)
Glucose, Bld: 90 mg/dL (ref 70–99)
Potassium: 3.8 mEq/L (ref 3.5–5.1)
Sodium: 138 mEq/L (ref 135–145)

## 2013-11-03 ENCOUNTER — Ambulatory Visit (INDEPENDENT_AMBULATORY_CARE_PROVIDER_SITE_OTHER): Payer: Medicare Other | Admitting: Cardiology

## 2013-11-03 ENCOUNTER — Encounter: Payer: Self-pay | Admitting: Cardiology

## 2013-11-03 VITALS — BP 142/82 | HR 64 | Ht 69.5 in | Wt 190.8 lb

## 2013-11-03 DIAGNOSIS — I10 Essential (primary) hypertension: Secondary | ICD-10-CM

## 2013-11-03 DIAGNOSIS — E785 Hyperlipidemia, unspecified: Secondary | ICD-10-CM

## 2013-11-03 MED ORDER — AMLODIPINE BESYLATE 2.5 MG PO TABS: ORAL_TABLET | ORAL | Status: DC

## 2013-11-03 MED ORDER — RANITIDINE HCL 300 MG PO TABS: 150.0000 mg | ORAL_TABLET | Freq: Two times a day (BID) | ORAL | Status: DC

## 2013-11-03 MED ORDER — SIMVASTATIN 10 MG PO TABS: 10.0000 mg | ORAL_TABLET | Freq: Every day | ORAL | Status: DC

## 2013-11-03 MED ORDER — BENAZEPRIL HCL 20 MG PO TABS: 20.0000 mg | ORAL_TABLET | Freq: Two times a day (BID) | ORAL | Status: DC

## 2013-11-03 MED ORDER — HYDROCHLOROTHIAZIDE 25 MG PO TABS: 12.5000 mg | ORAL_TABLET | Freq: Every day | ORAL | Status: DC

## 2013-11-03 MED ORDER — POTASSIUM CHLORIDE CRYS ER 10 MEQ PO TBCR: 10.0000 meq | EXTENDED_RELEASE_TABLET | Freq: Two times a day (BID) | ORAL | Status: DC

## 2013-11-03 NOTE — Patient Instructions (Signed)
Increase simvastatin to 10 mg daily  Continue your other therapy  Good luck with your back surgery.  I will see you in 6 months with lab work

## 2013-11-03 NOTE — Progress Notes (Signed)
Adrian Davis Date of Birth: 03-14-1941 Medical Record #[id number]  History of Present Illness: Adrian Davis is seen today for a followup visit. He has a history of HTN and HLD. No known CAD. He had a normal Adenosine back in 2008. His last echo was in January of 2012 showing grade 1 diastolic dysfunction, mild LVH and a normal EF. He has been evaluated by Dr. Arnoldo Morale for lumbar stenosis. He is planning on having  surgery. He denies any chest pain or shortness of breath. He otherwise feels well.  Current Outpatient Prescriptions on File Prior to Visit  Medication Sig Dispense Refill  . acetaminophen (TYLENOL) 650 MG CR tablet Take 1,300 mg by mouth every 8 (eight) hours as needed for pain.       Marland Kitchen amLODipine (NORVASC) 2.5 MG tablet Take 2 tablets in the AM and 1 tablet in the PM  90 tablet  1  . aspirin EC 81 MG tablet Take 81 mg by mouth daily.      . benazepril (LOTENSIN) 20 MG tablet TAKE 1 TABLET BY MOUTH 2 TIMES A DAY  60 tablet  0  . fish oil-omega-3 fatty acids 1000 MG capsule Take 1 g by mouth daily.      . hydrochlorothiazide (HYDRODIURIL) 25 MG tablet Take 12.5 mg by mouth daily.      . naproxen sodium (ANAPROX) 220 MG tablet Take 440 mg by mouth every morning.      . potassium chloride (K-DUR,KLOR-CON) 10 MEQ tablet Take 1-2 tablets (10-20 mEq total) by mouth 2 (two) times daily. Take 2 tablets in AM and 1 tablet in PM  90 tablet  3  . ranitidine (ZANTAC) 300 MG tablet Take 150 mg by mouth 2 (two) times daily.       No current facility-administered medications on file prior to visit.    No Known Allergies  Past Medical History  Diagnosis Date  . Hyperlipidemia   . Hypertension   . Degenerative disk disease   . Bell palsy Jan 2012    presented with facial numbness  . Edema   . Normal nuclear stress test 2008  . Abnormal echocardiogram Jan 2012    grade 1 diastolic dysfunction, normal EF, mild LVH  . Lumbar stenosis     Past Surgical History  Procedure Laterality Date  .  Shoulder surgery      LEFT  . Tonsillectomy    . Knee arthroscopy      RIGHT    History  Smoking status  . Former Smoker -- 1.50 packs/day for 10 years  . Types: Cigarettes  . Quit date: 05/15/1976  Smokeless tobacco  . Current User  . Types: Chew    History  Alcohol Use No    Family History  Problem Relation Age of Onset  . Heart attack Father   . Coronary artery disease Father   . Hypertension Father     Review of Systems: The review of systems is per the HPI.  All other systems were reviewed and are negative.  Physical Exam: BP 142/82  Pulse 64  Ht 5' 9.5" (1.765 m)  Wt 190 lb 12.8 oz (86.546 kg)  BMI 27.78 kg/m2 Patient is very pleasant and in no acute distress. Skin is warm and dry. Color is normal.  HEENT is unremarkable. Normocephalic/atraumatic. PERRL. Sclera are nonicteric. Neck is supple. No masses. No JVD. Lungs are clear. Cardiac exam shows a regular rate and rhythm. Abdomen is soft. Extremities are without edema. Gait and  ROM are intact. No gross neurologic deficits noted.   Laboratory data: Lab Results  Component Value Date   WBC 7.5 11/12/2010   HGB 15.0 11/12/2010   HCT 44.0 11/12/2010   PLT 223 11/12/2010   GLUCOSE 90 10/31/2013   CHOL 179 10/31/2013   TRIG 110.0 10/31/2013   HDL 37.50* 10/31/2013   LDLDIRECT 102.0 05/15/2011   LDLCALC 120* 10/31/2013   ALT 21 10/31/2013   AST 17 10/31/2013   NA 138 10/31/2013   K 3.8 10/31/2013   CL 106 10/31/2013   CREATININE 1.0 10/31/2013   BUN 18 10/31/2013   CO2 26 10/31/2013   PSA 1.79 11/12/2012   INR 1.01 11/12/2010   HGBA1C  Value: 5.7 (NOTE)                                                                       According to the ADA Clinical Practice Recommendations for 2011, when HbA1c is used as a screening test:   >=6.5%   Diagnostic of Diabetes Mellitus           (if abnormal result  is confirmed)  5.7-6.4%   Increased risk of developing Diabetes Mellitus  References:Diagnosis and Classification of Diabetes  Mellitus,Diabetes RSWN,4627,03(JKKXF 1):S62-S69 and Standards of Medical Care in         Diabetes - 2011,Diabetes Care,2011,34  (Suppl 1):S11-S61.* 11/12/2010     ECG today demonstrates normal sinus rhythm with voltage criteria for LVH. There is nonspecific T-wave abnormality.  Assessment / Plan: 1. HTN well controlled. Continue amlodipine, HCTZ, and lotensin. 2. Hyperlipidemia. He reports he quit taking fish oil because it made him feel and smell oily. He is only taking simvastatin every other day. I recommended increasing his simvastatin to 10 mg daily. We will repeat lab work in 6 months. 3. Lumbar stenosis. Considering surgical options. If needed he is cleared for surgery from a cardiac standpoint.

## 2013-11-06 ENCOUNTER — Encounter (HOSPITAL_COMMUNITY): Payer: Self-pay

## 2013-11-06 ENCOUNTER — Encounter (HOSPITAL_COMMUNITY)
Admission: RE | Admit: 2013-11-06 | Discharge: 2013-11-06 | Disposition: A | Discharge status: Home / Self Care | Payer: Medicare Other | Source: Ambulatory Visit | Attending: Neurosurgery | Admitting: Neurosurgery

## 2013-11-06 DIAGNOSIS — Z01812 Encounter for preprocedural laboratory examination: Secondary | ICD-10-CM | POA: Diagnosis not present

## 2013-11-06 DIAGNOSIS — Z01818 Encounter for other preprocedural examination: Secondary | ICD-10-CM | POA: Insufficient documentation

## 2013-11-06 DIAGNOSIS — I10 Essential (primary) hypertension: Secondary | ICD-10-CM | POA: Diagnosis not present

## 2013-11-06 LAB — BASIC METABOLIC PANEL
BUN: 17 mg/dL (ref 6–23)
CO2: 28 mEq/L (ref 19–32)
Calcium: 10.1 mg/dL (ref 8.4–10.5)
Chloride: 101 mEq/L (ref 96–112)
Creatinine, Ser: 0.83 mg/dL (ref 0.50–1.35)
GFR calc Af Amer: >90 mL/min (ref >90)
GFR calc non Af Amer: 86 mL/min — ABNORMAL LOW (ref >90)
Glucose, Bld: 80 mg/dL (ref 70–99)
Potassium: 4 mEq/L (ref 3.7–5.3)
Sodium: 140 mEq/L (ref 137–147)

## 2013-11-06 LAB — CBC
HCT: 44.2 % (ref 39.0–52.0)
Hemoglobin: 15 g/dL (ref 13.0–17.0)
MCH: 31.9 pg (ref 26.0–34.0)
MCHC: 33.9 g/dL (ref 30.0–36.0)
MCV: 94 fL (ref 78.0–100.0)
Platelets: 221 10*3/uL (ref 150–400)
RBC: 4.7 MIL/uL (ref 4.22–5.81)
RDW: 13.4 % (ref 11.5–15.5)
WBC: 7.3 10*3/uL (ref 4.0–10.5)

## 2013-11-06 LAB — SURGICAL PCR SCREEN
MRSA, PCR: NEGATIVE
Staphylococcus aureus: NEGATIVE

## 2013-11-06 NOTE — Pre-Procedure Instructions (Signed)
Savian Mazon Rathel  11/06/2013   Your procedure is scheduled on:  11/12/13  Report to [facility]  2 * 3 at 6 AM.  Call this number if you have problems the morning of surgery: [phone]   Remember:   Do not eat food or drink liquids after midnight.   Take these medicines the morning of surgery with A SIP OF WATER: norvasc,zantac   Do not wear jewelry, make-up or nail polish.  Do not wear lotions, powders, or perfumes. You may wear deodorant.  Do not shave 48 hours prior to surgery. Men may shave face and neck.  Do not bring valuables to the hospital.  [facility] is not responsible                  for any belongings or valuables.               Contacts, dentures or bridgework may not be worn into surgery.  Leave suitcase in the car. After surgery it may be brought to your room.  For patients admitted to the hospital, discharge time is determined by your                treatment team.               Patients discharged the day of surgery will not be allowed to drive  home.  Name and phone number of your driver:   Special Instructions: Shower using CHG 2 nights before surgery and the night before surgery.  If you shower the day of surgery use CHG.  Use special wash - you have one bottle of CHG for all showers.  You should use approximately 1/3 of the bottle for each shower.   Please read over the following fact sheets that you were given: Pain Booklet, Coughing and Deep Breathing, MRSA Information and Surgical Site Infection Prevention

## 2013-11-11 MED ORDER — CEFAZOLIN SODIUM-DEXTROSE 2-3 GM-% IV SOLR
2.0000 g | INTRAVENOUS | Status: AC
  Administered 2013-11-12: 2 g via INTRAVENOUS
  Filled 2013-11-11: qty 50

## 2013-11-12 ENCOUNTER — Inpatient Hospital Stay (HOSPITAL_COMMUNITY): Payer: Medicare Other

## 2013-11-12 ENCOUNTER — Encounter (HOSPITAL_COMMUNITY)
Admission: RE | Disposition: A | Discharge status: Home / Self Care | Payer: Self-pay | Source: Ambulatory Visit | Attending: Neurosurgery

## 2013-11-12 ENCOUNTER — Encounter (HOSPITAL_COMMUNITY): Payer: Medicare Other | Admitting: Certified Registered"

## 2013-11-12 ENCOUNTER — Inpatient Hospital Stay (HOSPITAL_COMMUNITY)
Admission: RE | Admit: 2013-11-12 | Discharge: 2013-11-14 | DRG: 520 | Disposition: A | Discharge status: Home / Self Care | Payer: Medicare Other | Source: Ambulatory Visit | Attending: Neurosurgery | Admitting: Neurosurgery

## 2013-11-12 ENCOUNTER — Encounter (HOSPITAL_COMMUNITY): Payer: Self-pay | Admitting: *Deleted

## 2013-11-12 ENCOUNTER — Inpatient Hospital Stay (HOSPITAL_COMMUNITY): Payer: Medicare Other | Admitting: Certified Registered"

## 2013-11-12 DIAGNOSIS — Z87891 Personal history of nicotine dependence: Secondary | ICD-10-CM | POA: Diagnosis not present

## 2013-11-12 DIAGNOSIS — R339 Retention of urine, unspecified: Secondary | ICD-10-CM | POA: Diagnosis not present

## 2013-11-12 DIAGNOSIS — M4310 Spondylolisthesis, site unspecified: Principal | ICD-10-CM | POA: Diagnosis present

## 2013-11-12 DIAGNOSIS — I10 Essential (primary) hypertension: Secondary | ICD-10-CM | POA: Diagnosis present

## 2013-11-12 DIAGNOSIS — M545 Low back pain, unspecified: Secondary | ICD-10-CM | POA: Diagnosis not present

## 2013-11-12 DIAGNOSIS — E785 Hyperlipidemia, unspecified: Secondary | ICD-10-CM | POA: Diagnosis not present

## 2013-11-12 DIAGNOSIS — M48062 Spinal stenosis, lumbar region with neurogenic claudication: Secondary | ICD-10-CM | POA: Diagnosis present

## 2013-11-12 DIAGNOSIS — M519 Unspecified thoracic, thoracolumbar and lumbosacral intervertebral disc disorder: Secondary | ICD-10-CM | POA: Diagnosis not present

## 2013-11-12 DIAGNOSIS — Z79899 Other long term (current) drug therapy: Secondary | ICD-10-CM

## 2013-11-12 DIAGNOSIS — Z7982 Long term (current) use of aspirin: Secondary | ICD-10-CM | POA: Diagnosis not present

## 2013-11-12 DIAGNOSIS — M549 Dorsalgia, unspecified: Secondary | ICD-10-CM | POA: Diagnosis not present

## 2013-11-12 DIAGNOSIS — M48061 Spinal stenosis, lumbar region without neurogenic claudication: Secondary | ICD-10-CM | POA: Diagnosis not present

## 2013-11-12 HISTORY — PX: LUMBAR LAMINECTOMY/DECOMPRESSION MICRODISCECTOMY: SHX5026

## 2013-11-12 SURGERY — LUMBAR LAMINECTOMY/DECOMPRESSION MICRODISCECTOMY 1 LEVEL
Anesthesia: General | Site: Spine Lumbar

## 2013-11-12 MED ORDER — VECURONIUM BROMIDE 10 MG IV SOLR
INTRAVENOUS | Status: AC
  Filled 2013-11-12: qty 10

## 2013-11-12 MED ORDER — POTASSIUM CHLORIDE CRYS ER 10 MEQ PO TBCR
10.0000 meq | EXTENDED_RELEASE_TABLET | Freq: Every day | ORAL | Status: DC
  Administered 2013-11-12 – 2013-11-13 (×2): 10 meq via ORAL
  Filled 2013-11-12 (×3): qty 1

## 2013-11-12 MED ORDER — LIDOCAINE HCL (CARDIAC) 20 MG/ML IV SOLN
INTRAVENOUS | Status: AC
  Filled 2013-11-12: qty 5

## 2013-11-12 MED ORDER — SODIUM CHLORIDE 0.9 % IR SOLN
Status: DC | PRN
  Administered 2013-11-12: 13:00:00

## 2013-11-12 MED ORDER — FENTANYL CITRATE 0.05 MG/ML IJ SOLN
INTRAMUSCULAR | Status: DC | PRN
  Administered 2013-11-12 (×4): 50 ug via INTRAVENOUS

## 2013-11-12 MED ORDER — PROPOFOL 10 MG/ML IV BOLUS
INTRAVENOUS | Status: AC
  Filled 2013-11-12: qty 20

## 2013-11-12 MED ORDER — NEOSTIGMINE METHYLSULFATE 1 MG/ML IJ SOLN
INTRAMUSCULAR | Status: DC | PRN
  Administered 2013-11-12: 4 mg via INTRAVENOUS

## 2013-11-12 MED ORDER — HYDROMORPHONE HCL PF 1 MG/ML IJ SOLN
0.2500 mg | INTRAMUSCULAR | Status: DC | PRN
  Administered 2013-11-12: 0.5 mg via INTRAVENOUS

## 2013-11-12 MED ORDER — MIDAZOLAM HCL 2 MG/2ML IJ SOLN
INTRAMUSCULAR | Status: AC
  Filled 2013-11-12: qty 2

## 2013-11-12 MED ORDER — ONDANSETRON HCL 4 MG/2ML IJ SOLN
INTRAMUSCULAR | Status: AC
  Filled 2013-11-12: qty 2

## 2013-11-12 MED ORDER — DIAZEPAM 5 MG PO TABS: 5.0000 mg | ORAL_TABLET | Freq: Four times a day (QID) | ORAL | Status: DC | PRN

## 2013-11-12 MED ORDER — ZOLPIDEM TARTRATE 5 MG PO TABS: 5.0000 mg | ORAL_TABLET | Freq: Every evening | ORAL | Status: DC | PRN

## 2013-11-12 MED ORDER — MIDAZOLAM HCL 5 MG/5ML IJ SOLN
INTRAMUSCULAR | Status: DC | PRN
  Administered 2013-11-12: 2 mg via INTRAVENOUS

## 2013-11-12 MED ORDER — ROCURONIUM BROMIDE 50 MG/5ML IV SOLN
INTRAVENOUS | Status: AC
  Filled 2013-11-12: qty 1

## 2013-11-12 MED ORDER — HYDROMORPHONE HCL PF 1 MG/ML IJ SOLN
INTRAMUSCULAR | Status: AC
  Filled 2013-11-12: qty 1

## 2013-11-12 MED ORDER — HYDROCHLOROTHIAZIDE 25 MG PO TABS
12.5000 mg | ORAL_TABLET | Freq: Every day | ORAL | Status: DC
  Filled 2013-11-12: qty 0.5

## 2013-11-12 MED ORDER — STERILE WATER FOR INJECTION IJ SOLN
INTRAMUSCULAR | Status: AC
  Filled 2013-11-12: qty 10

## 2013-11-12 MED ORDER — DIPHENHYDRAMINE HCL 25 MG PO TABS: 25.0000 mg | ORAL_TABLET | Freq: Every day | ORAL | Status: DC

## 2013-11-12 MED ORDER — MENTHOL 3 MG MT LOZG: 1.0000 | LOZENGE | OROMUCOSAL | Status: DC | PRN

## 2013-11-12 MED ORDER — MELOXICAM 7.5 MG PO TABS
7.5000 mg | ORAL_TABLET | Freq: Every day | ORAL | Status: DC
Start: 2013-11-13 — End: 2013-11-14
  Administered 2013-11-13 – 2013-11-14 (×2): 7.5 mg via ORAL
  Filled 2013-11-12 (×2): qty 1

## 2013-11-12 MED ORDER — CEFAZOLIN SODIUM-DEXTROSE 2-3 GM-% IV SOLR
2.0000 g | Freq: Three times a day (TID) | INTRAVENOUS | Status: AC
  Administered 2013-11-12 – 2013-11-13 (×2): 2 g via INTRAVENOUS
  Filled 2013-11-12 (×2): qty 50

## 2013-11-12 MED ORDER — ACETAMINOPHEN 325 MG PO TABS: 650.0000 mg | ORAL_TABLET | ORAL | Status: DC | PRN

## 2013-11-12 MED ORDER — POTASSIUM CHLORIDE CRYS ER 20 MEQ PO TBCR
20.0000 meq | EXTENDED_RELEASE_TABLET | Freq: Every day | ORAL | Status: DC
  Administered 2013-11-13 – 2013-11-14 (×2): 20 meq via ORAL
  Filled 2013-11-12 (×2): qty 1

## 2013-11-12 MED ORDER — SUCCINYLCHOLINE CHLORIDE 20 MG/ML IJ SOLN
INTRAMUSCULAR | Status: AC
  Filled 2013-11-12: qty 1

## 2013-11-12 MED ORDER — PROMETHAZINE HCL 25 MG/ML IJ SOLN: 6.2500 mg | INTRAMUSCULAR | Status: DC | PRN

## 2013-11-12 MED ORDER — GLYCOPYRROLATE 0.2 MG/ML IJ SOLN
INTRAMUSCULAR | Status: AC
  Filled 2013-11-12: qty 3

## 2013-11-12 MED ORDER — FAMOTIDINE 20 MG PO TABS
20.0000 mg | ORAL_TABLET | Freq: Two times a day (BID) | ORAL | Status: DC
  Filled 2013-11-12 (×5): qty 1

## 2013-11-12 MED ORDER — SIMVASTATIN 10 MG PO TABS
10.0000 mg | ORAL_TABLET | Freq: Every day | ORAL | Status: DC
  Administered 2013-11-12 – 2013-11-13 (×2): 10 mg via ORAL
  Filled 2013-11-12 (×3): qty 1

## 2013-11-12 MED ORDER — ALUM & MAG HYDROXIDE-SIMETH 200-200-20 MG/5ML PO SUSP: 30.0000 mL | Freq: Four times a day (QID) | ORAL | Status: DC | PRN

## 2013-11-12 MED ORDER — 0.9 % SODIUM CHLORIDE (POUR BTL) OPTIME
TOPICAL | Status: DC | PRN
  Administered 2013-11-12: 1000 mL

## 2013-11-12 MED ORDER — DOCUSATE SODIUM 100 MG PO CAPS
100.0000 mg | ORAL_CAPSULE | Freq: Two times a day (BID) | ORAL | Status: DC
  Administered 2013-11-12 – 2013-11-14 (×3): 100 mg via ORAL
  Filled 2013-11-12 (×5): qty 1

## 2013-11-12 MED ORDER — SUFENTANIL CITRATE 50 MCG/ML IV SOLN
INTRAVENOUS | Status: AC
  Filled 2013-11-12: qty 1

## 2013-11-12 MED ORDER — FENTANYL CITRATE 0.05 MG/ML IJ SOLN
INTRAMUSCULAR | Status: AC
  Filled 2013-11-12: qty 5

## 2013-11-12 MED ORDER — BACITRACIN ZINC 500 UNIT/GM EX OINT
TOPICAL_OINTMENT | CUTANEOUS | Status: DC | PRN
  Administered 2013-11-12: 1 via TOPICAL

## 2013-11-12 MED ORDER — ONDANSETRON HCL 4 MG/2ML IJ SOLN
INTRAMUSCULAR | Status: DC | PRN
  Administered 2013-11-12: 4 mg via INTRAVENOUS

## 2013-11-12 MED ORDER — PROPOFOL 10 MG/ML IV BOLUS
INTRAVENOUS | Status: DC | PRN
  Administered 2013-11-12: 100 mg via INTRAVENOUS

## 2013-11-12 MED ORDER — LACTATED RINGERS IV SOLN
INTRAVENOUS | Status: DC
  Administered 2013-11-12 (×2): via INTRAVENOUS

## 2013-11-12 MED ORDER — ACETAMINOPHEN 650 MG RE SUPP: 650.0000 mg | RECTAL | Status: DC | PRN

## 2013-11-12 MED ORDER — OXYCODONE-ACETAMINOPHEN 5-325 MG PO TABS: 1.0000 | ORAL_TABLET | ORAL | Status: DC | PRN

## 2013-11-12 MED ORDER — HYDROCHLOROTHIAZIDE 12.5 MG PO CAPS
12.5000 mg | ORAL_CAPSULE | Freq: Every day | ORAL | Status: DC
  Administered 2013-11-12 – 2013-11-14 (×3): 12.5 mg via ORAL
  Filled 2013-11-12 (×3): qty 1

## 2013-11-12 MED ORDER — OXYCODONE HCL 5 MG/5ML PO SOLN: 5.0000 mg | Freq: Once | ORAL | Status: DC | PRN

## 2013-11-12 MED ORDER — POTASSIUM CHLORIDE CRYS ER 10 MEQ PO TBCR: 10.0000 meq | EXTENDED_RELEASE_TABLET | Freq: Two times a day (BID) | ORAL | Status: DC

## 2013-11-12 MED ORDER — PHENOL 1.4 % MT LIQD: 1.0000 | OROMUCOSAL | Status: DC | PRN

## 2013-11-12 MED ORDER — ROCURONIUM BROMIDE 50 MG/5ML IV SOLN
INTRAVENOUS | Status: AC
Start: 2013-11-12 — End: 2013-11-12
  Filled 2013-11-12: qty 1

## 2013-11-12 MED ORDER — OXYCODONE HCL 5 MG PO TABS: 5.0000 mg | ORAL_TABLET | Freq: Once | ORAL | Status: DC | PRN

## 2013-11-12 MED ORDER — AMLODIPINE BESYLATE 2.5 MG PO TABS
2.5000 mg | ORAL_TABLET | Freq: Every day | ORAL | Status: DC
  Administered 2013-11-13 – 2013-11-14 (×2): 2.5 mg via ORAL
  Filled 2013-11-12 (×2): qty 1

## 2013-11-12 MED ORDER — LIDOCAINE HCL (CARDIAC) 20 MG/ML IV SOLN
INTRAVENOUS | Status: DC | PRN
  Administered 2013-11-12: 20 mg via INTRAVENOUS

## 2013-11-12 MED ORDER — ARTIFICIAL TEARS OP OINT
TOPICAL_OINTMENT | OPHTHALMIC | Status: AC
  Filled 2013-11-12: qty 3.5

## 2013-11-12 MED ORDER — MEPERIDINE HCL 25 MG/ML IJ SOLN: 6.2500 mg | INTRAMUSCULAR | Status: DC | PRN

## 2013-11-12 MED ORDER — BENAZEPRIL HCL 20 MG PO TABS
20.0000 mg | ORAL_TABLET | Freq: Two times a day (BID) | ORAL | Status: DC
  Administered 2013-11-12 – 2013-11-14 (×3): 20 mg via ORAL
  Filled 2013-11-12 (×5): qty 1

## 2013-11-12 MED ORDER — GLYCOPYRROLATE 0.2 MG/ML IJ SOLN
INTRAMUSCULAR | Status: DC | PRN
  Administered 2013-11-12: 0.6 mg via INTRAVENOUS

## 2013-11-12 MED ORDER — HEMOSTATIC AGENTS (NO CHARGE) OPTIME
TOPICAL | Status: DC | PRN
  Administered 2013-11-12: 1 via TOPICAL

## 2013-11-12 MED ORDER — LACTATED RINGERS IV SOLN: INTRAVENOUS | Status: DC

## 2013-11-12 MED ORDER — ARTIFICIAL TEARS OP OINT
TOPICAL_OINTMENT | OPHTHALMIC | Status: DC | PRN
  Administered 2013-11-12: 1 via OPHTHALMIC

## 2013-11-12 MED ORDER — HYDROCODONE-ACETAMINOPHEN 5-325 MG PO TABS: 1.0000 | ORAL_TABLET | ORAL | Status: DC | PRN

## 2013-11-12 MED ORDER — ONDANSETRON HCL 4 MG/2ML IJ SOLN: 4.0000 mg | INTRAMUSCULAR | Status: DC | PRN

## 2013-11-12 MED ORDER — BUPIVACAINE-EPINEPHRINE PF 0.5-1:200000 % IJ SOLN
INTRAMUSCULAR | Status: DC | PRN
  Administered 2013-11-12: 30 mL via PERINEURAL

## 2013-11-12 MED ORDER — ROCURONIUM BROMIDE 100 MG/10ML IV SOLN
INTRAVENOUS | Status: DC | PRN
  Administered 2013-11-12: 50 mg via INTRAVENOUS

## 2013-11-12 MED ORDER — MORPHINE SULFATE 2 MG/ML IJ SOLN: 1.0000 mg | INTRAMUSCULAR | Status: DC | PRN

## 2013-11-12 MED ORDER — THROMBIN 5000 UNITS EX SOLR
CUTANEOUS | Status: DC | PRN
  Administered 2013-11-12 (×2): 5000 [IU] via TOPICAL

## 2013-11-12 SURGICAL SUPPLY — 50 items
BAG DECANTER FOR FLEXI CONT (MISCELLANEOUS) ×3 IMPLANT
BENZOIN TINCTURE PRP APPL 2/3 (GAUZE/BANDAGES/DRESSINGS) ×3 IMPLANT
BLADE SURG ROTATE 9660 (MISCELLANEOUS) IMPLANT
BRUSH SCRUB EZ PLAIN DRY (MISCELLANEOUS) ×3 IMPLANT
BUR ACORN 6.0 (BURR) ×2 IMPLANT
BUR ACORN 6.0MM (BURR) ×1
BUR MATCHSTICK NEURO 3.0 LAGG (BURR) ×3 IMPLANT
CANISTER SUCT 3000ML (MISCELLANEOUS) ×3 IMPLANT
CLOSURE WOUND 1/2 X4 (GAUZE/BANDAGES/DRESSINGS) ×1
CONT SPEC 4OZ CLIKSEAL STRL BL (MISCELLANEOUS) ×3 IMPLANT
DRAPE LAPAROTOMY 100X72X124 (DRAPES) ×3 IMPLANT
DRAPE MICROSCOPE LEICA (MISCELLANEOUS) ×3 IMPLANT
DRAPE POUCH INSTRU U-SHP 10X18 (DRAPES) ×3 IMPLANT
DRAPE SURG 17X23 STRL (DRAPES) ×12 IMPLANT
ELECT BLADE 4.0 EZ CLEAN MEGAD (MISCELLANEOUS) ×3
ELECT REM PT RETURN 9FT ADLT (ELECTROSURGICAL) ×3
ELECTRODE BLDE 4.0 EZ CLN MEGD (MISCELLANEOUS) ×1 IMPLANT
ELECTRODE REM PT RTRN 9FT ADLT (ELECTROSURGICAL) ×1 IMPLANT
GAUZE SPONGE 4X4 16PLY XRAY LF (GAUZE/BANDAGES/DRESSINGS) IMPLANT
GLOVE BIO SURGEON STRL SZ8.5 (GLOVE) ×3 IMPLANT
GLOVE EXAM NITRILE LRG STRL (GLOVE) IMPLANT
GLOVE EXAM NITRILE MD LF STRL (GLOVE) IMPLANT
GLOVE EXAM NITRILE XL STR (GLOVE) IMPLANT
GLOVE EXAM NITRILE XS STR PU (GLOVE) IMPLANT
GLOVE SS BIOGEL STRL SZ 8 (GLOVE) ×1 IMPLANT
GLOVE SUPERSENSE BIOGEL SZ 8 (GLOVE) ×2
GOWN BRE IMP SLV AUR LG STRL (GOWN DISPOSABLE) IMPLANT
GOWN BRE IMP SLV AUR XL STRL (GOWN DISPOSABLE) ×3 IMPLANT
GOWN STRL REIN 2XL LVL4 (GOWN DISPOSABLE) IMPLANT
KIT BASIN OR (CUSTOM PROCEDURE TRAY) ×3 IMPLANT
KIT ROOM TURNOVER OR (KITS) ×3 IMPLANT
NEEDLE HYPO 21X1.5 SAFETY (NEEDLE) IMPLANT
NEEDLE HYPO 22GX1.5 SAFETY (NEEDLE) ×3 IMPLANT
NS IRRIG 1000ML POUR BTL (IV SOLUTION) ×3 IMPLANT
PACK LAMINECTOMY NEURO (CUSTOM PROCEDURE TRAY) ×3 IMPLANT
PAD ARMBOARD 7.5X6 YLW CONV (MISCELLANEOUS) ×9 IMPLANT
PATTIES SURGICAL .5 X1 (DISPOSABLE) IMPLANT
RUBBERBAND STERILE (MISCELLANEOUS) ×6 IMPLANT
SPONGE GAUZE 4X4 12PLY (GAUZE/BANDAGES/DRESSINGS) ×3 IMPLANT
SPONGE SURGIFOAM ABS GEL SZ50 (HEMOSTASIS) ×3 IMPLANT
STRIP CLOSURE SKIN 1/2X4 (GAUZE/BANDAGES/DRESSINGS) ×2 IMPLANT
SUT VIC AB 1 CT1 18XBRD ANBCTR (SUTURE) ×1 IMPLANT
SUT VIC AB 1 CT1 8-18 (SUTURE) ×2
SUT VIC AB 2-0 CP2 18 (SUTURE) ×3 IMPLANT
SYR 20CC LL (SYRINGE) IMPLANT
SYR 20ML ECCENTRIC (SYRINGE) ×3 IMPLANT
TAPE CLOTH SURG 4X10 WHT LF (GAUZE/BANDAGES/DRESSINGS) ×3 IMPLANT
TOWEL OR 17X24 6PK STRL BLUE (TOWEL DISPOSABLE) ×3 IMPLANT
TOWEL OR 17X26 10 PK STRL BLUE (TOWEL DISPOSABLE) ×3 IMPLANT
WATER STERILE IRR 1000ML POUR (IV SOLUTION) ×3 IMPLANT

## 2013-11-12 NOTE — Progress Notes (Signed)
Chaplain visited pt who had requested prayer before surgery.  Chaplain offered emotional support through compassionate and caring conversation and spiritual support through prayer and a ministry of presence.     11/12/13 1100  Clinical Encounter Type  Visited With Patient and family together  Visit Type Spiritual support  Referral From Patient;Family  Spiritual Encounters  Spiritual Needs Prayer   Estelle June, chaplain, pager [phone]

## 2013-11-12 NOTE — Anesthesia Preprocedure Evaluation (Addendum)
Anesthesia Evaluation  Patient identified by MRN, date of birth, ID band Patient awake    Reviewed: Allergy & Precautions, H&P , NPO status , Patient's Chart, lab work & pertinent test results  History of Anesthesia Complications Negative for: history of anesthetic complications  Airway Mallampati: II TM Distance: >3 FB Neck ROM: Full    Dental  (+) Caps, Poor Dentition and Dental Advisory Given   Pulmonary former smoker (quit '77),  breath sounds clear to auscultation  Pulmonary exam normal       Cardiovascular hypertension, Pt. on medications - anginaRhythm:Regular Rate:Normal  '08 stress test: normal '12 ECHO: LVH with normal EF and grade 1 diastolic dysfunction   Neuro/Psych Bell's palsy Chronic back pain negative psych ROS   GI/Hepatic Neg liver ROS, GERD-  Medicated and Controlled,  Endo/Other  negative endocrine ROS  Renal/GU negative Renal ROS     Musculoskeletal   Abdominal   Peds  Hematology negative hematology ROS (+)   Anesthesia Other Findings   Reproductive/Obstetrics                         Anesthesia Physical Anesthesia Plan  ASA: II  Anesthesia Plan: General   Post-op Pain Management:    Induction: Intravenous  Airway Management Planned: Oral ETT  Additional Equipment:   Intra-op Plan:   Post-operative Plan: Extubation in OR  Informed Consent: I have reviewed the patients History and Physical, chart, labs and discussed the procedure including the risks, benefits and alternatives for the proposed anesthesia with the patient or authorized representative who has indicated his/her understanding and acceptance.   Dental advisory given  Plan Discussed with: Surgeon and CRNA  Anesthesia Plan Comments: (Plan routine monitors, GETA)        Anesthesia Quick Evaluation

## 2013-11-12 NOTE — Progress Notes (Signed)
Notified Dr. Glennon Mac that pt. Chewed tobacco today from [phone].

## 2013-11-12 NOTE — H&P (Signed)
Subjective: The patient is a 73 year old white male who has had chronic back and leg pain. He has failed medical management. He has been worked up with lumbar x-rays and a lumbar MRI which demonstrated L4-5 spondylolisthesis and spinal stenosis. I have extensively discussed the various treatment options with the patient and his wife. We discussed a simple laminectomy versus laminectomy and fusion. The patient was not interested in the fusion. He has weighed the risks, benefits, and alternatives surgery and decided proceed with an L4-5 decompressive laminectomy.   Past Medical History  Diagnosis Date  . Hyperlipidemia   . Degenerative disk disease   . Bell palsy Jan 2012    presented with facial numbness  . Edema   . Normal nuclear stress test 2008  . Abnormal echocardiogram Jan 2012    grade 1 diastolic dysfunction, normal EF, mild LVH  . Lumbar stenosis   . Hypertension     dr [name]    Past Surgical History  Procedure Laterality Date  . Shoulder surgery      LEFT  . Tonsillectomy    . Knee arthroscopy      RIGHT    No Known Allergies  History  Substance Use Topics  . Smoking status: Former Smoker -- 1.50 packs/day for 10 years    Types: Cigarettes    Quit date: 05/15/1976  . Smokeless tobacco: Current User    Types: Chew  . Alcohol Use: Yes     Comment: occ    Family History  Problem Relation Age of Onset  . Heart attack Father   . Coronary artery disease Father   . Hypertension Father    Prior to Admission medications   Medication Sig Start Date End Date Taking? Authorizing Provider  acetaminophen (TYLENOL) 650 MG CR tablet Take 1,300 mg by mouth every 8 (eight) hours as needed for pain.    Yes Historical Provider, MD  amLODipine (NORVASC) 2.5 MG tablet Take 2 tablets in the AM and 1 tablet in the PM 11/03/13  Yes Peter M Martinique, MD  aspirin EC 81 MG tablet Take 81 mg by mouth daily.   Yes Historical Provider, MD  benazepril (LOTENSIN) 20 MG tablet Take 1  tablet (20 mg total) by mouth 2 (two) times daily. 11/03/13  Yes Peter M Martinique, MD  bisacodyl (DULCOLAX) 5 MG EC tablet Take 5 mg by mouth daily.   Yes Historical Provider, MD  diphenhydrAMINE (BENADRYL) 25 MG tablet Take 25 mg by mouth at bedtime.   Yes Historical Provider, MD  fish oil-omega-3 fatty acids 1000 MG capsule Take 1 g by mouth daily.   Yes Historical Provider, MD  hydrochlorothiazide (HYDRODIURIL) 25 MG tablet Take 0.5 tablets (12.5 mg total) by mouth daily. 11/03/13  Yes Peter M Martinique, MD  naproxen sodium (ANAPROX) 220 MG tablet Take 440 mg by mouth every morning.   Yes Historical Provider, MD  potassium chloride (K-DUR,KLOR-CON) 10 MEQ tablet Take 1-2 tablets (10-20 mEq total) by mouth 2 (two) times daily. Take 2 tablets in AM and 1 tablet in PM 11/03/13  Yes Peter M Martinique, MD  ranitidine (ZANTAC) 300 MG tablet Take 0.5 tablets (150 mg total) by mouth 2 (two) times daily. 11/03/13  Yes Peter M Martinique, MD  simvastatin (ZOCOR) 10 MG tablet Take 1 tablet (10 mg total) by mouth daily. 11/03/13  Yes Peter M Martinique, MD  meloxicam [facility]) 7.5 MG tablet  09/12/13   Historical Provider, MD     Review of Systems  Positive ROS: As above  All other systems have been reviewed and were otherwise negative with the exception of those mentioned in the HPI and as above.  Objective: Vital signs in last 24 hours: Temp:  [98.2 F (36.8 C)] 98.2 F (36.8 C) (01/21 0957) Pulse Rate:  [76] 76 (01/21 0957) Resp:  [20] 20 (01/21 0957) BP: (166)/(95) 166/95 mmHg (01/21 0957) SpO2:  [100 %] 100 % (01/21 0957)  General Appearance: Alert, cooperative, no distress, appears stated age Head: Normocephalic, without obvious abnormality, atraumatic Eyes: PERRL, conjunctiva/corneas clear, EOM's intact,     Ears: Normal TM's and external ear canals, both ears Throat: Lips, mucosa, and tongue normal; teeth and gums normal Neck: Supple, symmetrical, trachea midline, no adenopathy; thyroid: No  enlargement/tenderness/nodules; no carotid bruit or JVD Back: Symmetric, no curvature, ROM normal, no CVA tenderness Lungs: Clear to auscultation bilaterally, respirations unlabored Heart: Regular rate and rhythm, S1 and S2 normal, no murmur, rub or gallop Abdomen: Soft, non-tender, bowel sounds active all four quadrants, no masses, no organomegaly Extremities: Extremities normal, atraumatic, no cyanosis or edema Pulses: 2+ and symmetric all extremities Skin: Skin color, texture, turgor normal, no rashes or lesions  NEUROLOGIC:   Mental status: alert and oriented, no aphasia, good attention span, Fund of knowledge/ memory ok Motor Exam - grossly normal Sensory Exam - grossly normal Reflexes:  Coordination - grossly normal Gait - grossly normal Balance - grossly normal Cranial Nerves: I: smell Not tested  II: visual acuity  OS: Normal    OD: Normal   II: visual fields Full to confrontation  II: pupils Equal, round, reactive to light  III,VII: ptosis None  III,IV,VI: extraocular muscles  Full ROM  V: mastication Normal  V: facial light touch sensation  Normal  V,VII: corneal reflex  Present  VII: facial muscle function - upper  Normal  VII: facial muscle function - lower Normal  VIII: hearing Not tested  IX: soft palate elevation  Normal  IX,X: gag reflex Present  XI: trapezius strength  5/5  XI: sternocleidomastoid strength 5/5  XI: neck flexion strength  5/5  XII: tongue strength  Normal    Data Review Lab Results  Component Value Date   WBC 7.3 11/06/2013   HGB 15.0 11/06/2013   HCT 44.2 11/06/2013   MCV 94.0 11/06/2013   PLT 221 11/06/2013   Lab Results  Component Value Date   NA 140 11/06/2013   K 4.0 11/06/2013   CL 101 11/06/2013   CO2 28 11/06/2013   BUN 17 11/06/2013   CREATININE 0.83 11/06/2013   GLUCOSE 80 11/06/2013   Lab Results  Component Value Date   INR 1.01 11/12/2010    Assessment/Plan: L4-5 spondylolisthesis, spinal stenosis, lumbago, lumbar  radiculopathy, neurogenic claudication: I discussed situation with the patient and his wife. I reviewed the imaging studies with them and pointed out the abnormalities. We have discussed the various treatment options including surgery. We discussed a laminectomy and fusion versus a simple laminectomy. I described the surgery. I've shown him surgical models. We have discussed the risks, benefits, alternatives, and likelihood of achieving our goals with surgery. I've answered all the patient, and his wife's, questions. He has decided to proceed with a L4-5 decompressive laminectomy.   Ophelia Charter 11/12/2013 12:32 PM

## 2013-11-12 NOTE — Transfer of Care (Signed)
Immediate Anesthesia Transfer of Care Note  Patient: Adrian Davis  Procedure(s) Performed: Procedure(s) with comments: LUMBAR LAMINECTOMY/DECOMPRESSION MICRODISCECTOMY 1 LEVEL (N/A) - L4 Laminectomy  Patient Location: PACU  Anesthesia Type:General  Level of Consciousness: awake, alert  and oriented  Airway & Oxygen Therapy: Patient Spontanous Breathing and Patient connected to nasal cannula oxygen  Post-op Assessment: Report given to PACU RN, Post -op Vital signs reviewed and stable and Patient moving all extremities X 4  Post vital signs: Reviewed and stable  Complications: No apparent anesthesia complications

## 2013-11-12 NOTE — Preoperative (Signed)
Beta Blockers   Reason not to administer Beta Blockers:Not Applicable 

## 2013-11-12 NOTE — Anesthesia Postprocedure Evaluation (Signed)
  Anesthesia Post-op Note  Patient: Adrian Davis  Procedure(s) Performed: Procedure(s) with comments: LUMBAR LAMINECTOMY/DECOMPRESSION MICRODISCECTOMY 1 LEVEL (N/A) - L4 Laminectomy  Patient Location: PACU  Anesthesia Type:General  Level of Consciousness: awake, alert , oriented and patient cooperative  Airway and Oxygen Therapy: Patient Spontanous Breathing and Patient connected to nasal cannula oxygen  Post-op Pain: none  Post-op Assessment: Post-op Vital signs reviewed, Patient's Cardiovascular Status Stable, Respiratory Function Stable, Patent Airway, No signs of Nausea or vomiting and Pain level controlled  Post-op Vital Signs: Reviewed and stable  Complications: No apparent anesthesia complications

## 2013-11-12 NOTE — Anesthesia Procedure Notes (Signed)
Procedure Name: Intubation Date/Time: 11/12/2013 12:50 PM Performed by: Maryland Pink Pre-anesthesia Checklist: Patient identified, Timeout performed, Emergency Drugs available, Suction available and Patient being monitored Patient Re-evaluated:Patient Re-evaluated prior to inductionOxygen Delivery Method: Circle system utilized Preoxygenation: Pre-oxygenation with 100% oxygen Intubation Type: IV induction Ventilation: Mask ventilation without difficulty Laryngoscope Size: Mac and 3 Grade View: Grade I Tube type: Oral Tube size: 7.5 mm Number of attempts: 1 Airway Equipment and Method: Stylet Placement Confirmation: ETT inserted through vocal cords under direct vision,  positive ETCO2 and breath sounds checked- equal and bilateral Secured at: 22 cm Tube secured with: Tape Dental Injury: Teeth and Oropharynx as per pre-operative assessment

## 2013-11-12 NOTE — Op Note (Signed)
Brief history: The patient is a 73 year old white male who's had a chronic history of back, and bilateral buttock and leg pain. He has failed medical management and was worked up with a lumbar MRI. This demonstrated an L4-5 spondylolisthesis with severe spinal stenosis. I discussed the various treatment options with the patient and his wife including a simple decompressive laminectomy versus a laminectomy and fusion. The patient has weighed the risks, benefits, and alternatives surgery and decided proceed with a lumbar laminectomy.  Preoperative diagnosis: L4-5 spondylolisthesis, spinal stenosis, lumbago, lumbar radiculopathy, neurogenic claudication  Postoperative diagnosis: The same  Procedure: L3-4 and L4-5 laminectomy to decompress the bilateral L4 and L5 nerve roots  using micro-dissection  Surgeon: Dr. Earle Gell  Asst.: Dr. Oneida Arenas  Anesthesia: Gen. endotracheal  Estimated blood loss: 100 cc  Drains: None  Complications: None  Description of procedure: The patient was brought to the operating room by the anesthesia team. General endotracheal anesthesia was induced. The patient was turned to the prone position on the Wilson frame. The patient's lumbosacral region was then prepared with Betadine scrub and Betadine solution. Sterile drapes were applied.  I then injected the area to be incised with Marcaine with epinephrine solution. I then used a scalpel to make a linear midline incision over the L3-4 and L4-5 intervertebral disc space. I then used electrocautery to perform a bilateral  subperiosteal dissection exposing the spinous process and lamina of L3, L4 and L5. We obtained intraoperative radiograph to confirm our location. I then inserted the [facility] retractor for exposure. I incised interspinous ligament at L3-4 and L4-5 with a scalpel. I used the Leksell nodule were to remove the spinous process of L4 and the cephalad aspect of the L5 spinous process and the caudal aspect  of the L3 spinous process.  We then brought the operative microscope into the field. Under its magnification and illumination we completed the microdissection. I used a high-speed drill to perform a laminotomy at L3 and L4 bilaterally. I then used a Kerrison punches to complete the L4 laminectomy and to widen the lateral laminotomies at L3 and removed the ligamentum flavum at L3-4 and L4-5. We then used microdissection to free up the thecal sac and the L4 and L5 nerve root from the epidural tissue. I then used a Kerrison punch to perform a foraminotomy at about the L4 and L5 nerve root. We inspected the intervertebral disc at L3-4 and L4-5 bilaterally. There was no significant herniations.  I then palpated along the ventral surface of the thecal sac and along exit route of the L4 and L5 nerve root and noted that the neural structures were well decompressed. This completed the decompression.  We then obtained hemostasis using bipolar electrocautery. We irrigated the wound out with bacitracin solution. We then removed the retractor. We then reapproximated the patient's thoracolumbar fascia with interrupted #1 Vicryl suture. We then reapproximated the patient's subcutaneous tissue with interrupted 3-0 Vicryl suture. We then reapproximated patient's skin with Steri-Strips and benzoin. The was then coated with bacitracin ointment. The drapes were removed. The patient was subsequently returned to the supine position where they were extubated by the anesthesia team. The patient was then transported to the postanesthesia care unit in stable condition. All sponge instrument and needle counts were reportedly correct at the end of this case.

## 2013-11-13 MED ORDER — TAMSULOSIN HCL 0.4 MG PO CAPS
0.8000 mg | ORAL_CAPSULE | Freq: Every day | ORAL | Status: DC
  Administered 2013-11-13 – 2013-11-14 (×2): 0.8 mg via ORAL
  Filled 2013-11-13 (×2): qty 2

## 2013-11-13 NOTE — Evaluation (Signed)
Physical Therapy Evaluation Patient Details Name: Adrian Davis MRN: [id number] DOB: 11/18/1940 Today's Date: 11/13/2013 Time: 0867-6195 PT Time Calculation (min): 21 min  PT Assessment / Plan / Recommendation History of Present Illness  73 y.o. male admitted to [facility] on 11/12/13 for elective laminectomy, decompression, and microdiscectomy L4 level.    Clinical Impression  Pt is POD #1 s/p lumbar spine surgery.  He is moving well and was able to demonstrate proficiency on the stairs.  He is going home with wife's supervision.  All education completed.  He would benefit from a cane for use outside as his gait on level indoor surfaces is mildly staggering.  PT to sign off.  No further acute or f/u PT needs at this time.      PT Assessment  Patent does not need any further PT services    Follow Up Recommendations  No PT follow up;Supervision - Intermittent    Does the patient have the potential to tolerate intense rehabilitation     NA  Barriers to Discharge   None      Equipment Recommendations  Cane    Recommendations for Other Services   None  Frequency   NA- one time eval and d/c   Precautions / Restrictions Precautions Precautions: Back Precaution Booklet Issued: Yes (comment) Precaution Comments: Precautions verbally reviewed including motion restrictions, lifting restrictions, functional examples of when he may break precautions, and activity recommendations.     Pertinent Vitals/Pain See vitals flow sheet.      Mobility  Bed Mobility Overal bed mobility: Modified Independent General bed mobility comments: Verbally reviewed log roll technique to get into and out of bed.  Transfers Overall transfer level: Modified independent General transfer comment: god posture Ambulation/Gait Ambulation/Gait assistance: Modified independent (Device/Increase time) Ambulation Distance (Feet): 300 Feet Assistive device: None Gait Pattern/deviations: Step-through pattern (mildly  staggering gait pattern, but no external assist neede) Gait velocity: decreased General Gait Details: cautious due to pain, 1-2 small staggers, but no external assist needed to maintain balance.  Pt is resistant, but would be a good idea to use a cane outside for stability while walking over uneven ground as he is staggering on a flat level surface here in the hospital.   Stairs: Yes Stairs assistance: Modified independent (Device/Increase time) Stair Management: No rails;Step to pattern;Forwards Number of Stairs: 6 (3 steps x 2) General stair comments: pt needs to take a little extra time to go up and down steps without the railing.     Exercises Other Exercises Other Exercises: educated that walking needs to be his main form of exercise.   Pt's wife reports he has several exercise machines including a recumbant bike and what sounds like an elliptical at home.  I said he would need to get clearance from the MD to start using those machines.   Other Exercises: Also educated pt re: avoiding static positions ( no sitting or standing still for >30-45 mins at one time).  Wife asked about long car trips and I encouraged them to stop every hour if traveling in a car.       PT Goals(Current goals can be found in the care plan section) Acute Rehab PT Goals Patient Stated Goal: eval only PT Goal Formulation: No goals set, d/c therapy  Visit Information  Last PT Received On: 11/13/13 Assistance Needed: +1 History of Present Illness: 73 y.o. male admitted to [facility] on 11/12/13 for elective laminectomy, decompression, and microdiscectomy L4 level.  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 Dominant Hand: Right    Cognition  Cognition Arousal/Alertness: Awake/alert Behavior During Therapy: WFL for tasks assessed/performed Overall Cognitive Status: Within Functional Limits for tasks assessed    Extremity/Trunk Assessment Upper Extremity Assessment Upper Extremity Assessment: Defer to OT evaluation Lower Extremity Assessment Lower Extremity Assessment: Overall WFL for tasks assessed (pt has a bad right knee-arthritis, gets shots) Cervical / Trunk Assessment Cervical / Trunk Assessment: Normal   Balance Balance Overall balance assessment: Modified Independent  End of Session PT - End of Session Activity Tolerance: Patient tolerated treatment well Patient left: Other (comment) (standing in room with wife. ) Nurse Communication: Mobility status;Other (comment) (cane)       Katsumi Wisler B. Zyden Suman, PT, DPT [phone]   11/13/2013, 3:10 PM

## 2013-11-13 NOTE — Progress Notes (Signed)
Occupational Therapy Evaluation Patient Details Name: Adrian Davis MRN: [id number] DOB: 1941-07-31 Today's Date: 11/13/2013 Time: 5638-7564 OT Time Calculation (min): 28 min  OT Assessment / Plan / Recommendation History of present illness Laminectomy/decompression/microdiscetomy L4   Clinical Impression   Completed all education with pt and wife. Given written information. Pt @ overall mod I to S level with all ADL. Pt ready for D/C. No equipment needs.    OT Assessment  Patient does not need any further OT services    Follow Up Recommendations  No OT follow up;Supervision - Intermittent    Barriers to Discharge      Equipment Recommendations  None recommended by OT    Recommendations for Other Services    Frequency       Precautions / Restrictions Precautions Precautions: Back Precaution Booklet Issued: Yes (comment)   Pertinent Vitals/Pain no apparent distress     ADL  Lower Body Dressing: Minimal assistance Where Assessed - Lower Body Dressing: Unsupported sit to stand Toilet Transfer: Supervision/safety Toilet Transfer Method: Other (comment) (ambulating) Toilet Transfer Equipment: Comfort height toilet Toileting - Clothing Manipulation and Hygiene: Supervision/safety Where Assessed - Toileting Clothing Manipulation and Hygiene: Sit to stand from 3-in-1 or toilet Tub/Shower Transfer:  (discussed safe transfer technique) Equipment Used: Reacher;Long-handled sponge Transfers/Ambulation Related to ADLs: mod I ADL Comments: educated on back precautions/AE/DME    OT Diagnosis:    OT Problem List:   OT Treatment Interventions:     OT Goals(Current goals can be found in the care plan section) Acute Rehab OT Goals Patient Stated Goal: eval only  Visit Information  Last OT Received On: 11/13/13 History of Present Illness: Laminectomy/decompression/microdiscetomy L4       Prior Functioning     Home Living Family/patient expects to be discharged to::  Private residence Living Arrangements: Spouse/significant other Available Help at Discharge: Available 24 hours/day Type of Home: House Home Access: Stairs to enter Technical brewer of Steps: 3 Entrance Stairs-Rails: None Home Layout: One level Home Equipment: Civil engineer, contracting - built in 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         Vision/Perception     Solicitor Arousal/Alertness: Awake/alert Behavior During Therapy: WFL for tasks assessed/performed Overall Cognitive Status: Within Functional Limits for tasks assessed    Extremity/Trunk Assessment Upper Extremity Assessment Upper Extremity Assessment: Overall WFL for tasks assessed Lower Extremity Assessment Lower Extremity Assessment: Overall WFL for tasks assessed Cervical / Trunk Assessment Cervical / Trunk Assessment: Normal     Mobility Bed Mobility Overal bed mobility: Modified Independent General bed mobility comments: Able to return demonstrate proper bed mobility techniques Transfers Overall transfer level: Modified independent General transfer comment: god posture     Exercise     Balance Balance Overall balance assessment: Independent   End of Session OT - End of Session Activity Tolerance: Patient tolerated treatment well Patient left: in bed;with call bell/phone within reach;with family/visitor present Nurse Communication: Mobility status;Other (comment) (ready for D/C)  GO     Antoneo Ghrist,HILLARY 11/13/2013, 12:48 PM [facility], OTR/L  [phone] 11/13/2013

## 2013-11-13 NOTE — Progress Notes (Signed)
Patient ID: Adrian Davis, male   DOB: 1941-05-03, 73 y.o.   MRN: [id number] Subjective:  The patient is alert and pleasant. He says his leg pain is gone. He is pleased with that. Unfortunately he is having some urinary retention.  Objective: Vital signs in last 24 hours: Temp:  [97.2 F (36.2 C)-99 F (37.2 C)] 98.6 F (37 C) (01/22 1229) Pulse Rate:  [54-103] 74 (01/22 1229) Resp:  [8-20] 16 (01/22 1229) BP: (100-170)/(51-77) 133/77 mmHg (01/22 1229) SpO2:  [92 %-99 %] 95 % (01/22 1229)  Intake/Output from previous day: 01/21 0701 - 01/22 0700 In: 2100 [P.O.:600; I.V.:1500] Out: 850 [Urine:850] Intake/Output this shift: Total I/O In: 240 [P.O.:240] Out: 1150 [Urine:1150]  Physical exam the patient is alert and oriented. His strength is grossly normal his lower extremities.  Lab Results: No results found for this basename: WBC, HGB, HCT, PLT,  in the last 72 hours BMET No results found for this basename: NA, K, CL, CO2, GLUCOSE, BUN, CREATININE, CALCIUM,  in the last 72 hours  Studies/Results: Dg Lumbar Spine 1 View  11/12/2013   CLINICAL DATA:  Planned L4 laminectomy  EXAM: LUMBAR SPINE - 1 VIEW  COMPARISON:  MRI of the lumbar spine dated November 19, 2012  FINDINGS: The metallic trocar overlies the inferior aspect of the L4 spinous process approximately 3 cm posterior to the L4-5 disc space. There is metallic suture visible at the level of the L3 and L4 spinous processes.  IMPRESSION: The metallic trocar overlies the lower aspect of the L4 spinous process approximately 3 cm from the posterior margin of the L4-L5 disc space.   Electronically Signed   By: David  Martinique   On: 11/12/2013 15:19    Assessment/Plan: Postop day 1: The patient is doing well from the back point of view.  Urinary retention: I will add Flomax. He may need to be catheterized again. Hopefully this will resolve and we can send him home tomorrow.  LOS: 1 day     Dalexa Gentz D 11/13/2013, 1:52  PM

## 2013-11-14 ENCOUNTER — Encounter (HOSPITAL_COMMUNITY): Payer: Self-pay | Admitting: Neurosurgery

## 2013-11-14 MED ORDER — DSS 100 MG PO CAPS: 100.0000 mg | ORAL_CAPSULE | Freq: Two times a day (BID) | ORAL | Status: DC

## 2013-11-14 MED ORDER — TAMSULOSIN HCL 0.4 MG PO CAPS: 0.8000 mg | ORAL_CAPSULE | Freq: Every day | ORAL | Status: DC

## 2013-11-14 MED ORDER — DIAZEPAM 5 MG PO TABS: 5.0000 mg | ORAL_TABLET | Freq: Four times a day (QID) | ORAL | Status: DC | PRN

## 2013-11-14 MED ORDER — OXYCODONE-ACETAMINOPHEN 10-325 MG PO TABS: 1.0000 | ORAL_TABLET | ORAL | Status: DC | PRN

## 2013-11-14 NOTE — Progress Notes (Signed)
Pt doing very well. Pt given D/C instructions with Rx's, verbal understanding was given. All questions were answered prior to D/C. Pt D/C'd home via wheelchair @ [phone] per MD order. Pt was stable at D/C and had no other needs at the time. Holli Humbles, RN

## 2013-11-14 NOTE — Discharge Summary (Signed)
Physician Discharge Summary  Patient ID: Adrian Davis MRN: [id number] DOB/AGE: [birth year] 72 y.o.  Admit date: 11/12/2013 Discharge date: 11/14/2013  Admission Diagnoses: Lumbar spinal stenosis, lumbago, lumbar radiculopathy  Discharge Diagnoses: The same Active Problems:   Lumbar stenosis with neurogenic claudication   Discharged Condition: good  Hospital Course: I performed an L3-4 and L4-5 decompressive laminectomy on the patient on 11/12/2013. The surgery went well.  The patient's postoperative course was only remarkable for urinary retention. We started him on Flomax and this resolved.  On postop day #2 the patient requested discharge to home. He was given oral and written discharge instructions. All his questions were answered.  Consults: None Significant Diagnostic Studies: None Treatments: L3-4 and L4-5 decompressive laminectomy using microdissection Discharge Exam: Blood pressure 115/75, pulse 82, temperature 98.5 F (36.9 C), temperature source Oral, resp. rate 16, SpO2 93.00%. The patient is alert and pleasant. His strength is normal in his lower extremities. His dressing is clean and dry.  Disposition: Home     Medication List    STOP taking these medications       acetaminophen 650 MG CR tablet  Commonly known as:  TYLENOL     naproxen sodium 220 MG tablet  Commonly known as:  ANAPROX      TAKE these medications       amLODipine 2.5 MG tablet  Commonly known as:  NORVASC  Take 2 tablets in the AM and 1 tablet in the PM     aspirin EC 81 MG tablet  Take 81 mg by mouth daily.     benazepril 20 MG tablet  Commonly known as:  LOTENSIN  Take 1 tablet (20 mg total) by mouth 2 (two) times daily.     bisacodyl 5 MG EC tablet  Commonly known as:  DULCOLAX  Take 5 mg by mouth daily.     diazepam 5 MG tablet  Commonly known as:  VALIUM  Take 1 tablet (5 mg total) by mouth every 6 (six) hours as needed for muscle spasms.     diphenhydrAMINE 25 MG  tablet  Commonly known as:  BENADRYL  Take 25 mg by mouth at bedtime.     DSS 100 MG Caps  Take 100 mg by mouth 2 (two) times daily.     fish oil-omega-3 fatty acids 1000 MG capsule  Take 1 g by mouth daily.     hydrochlorothiazide 25 MG tablet  Commonly known as:  HYDRODIURIL  Take 0.5 tablets (12.5 mg total) by mouth daily.     meloxicam 7.5 MG tablet  Commonly known as:  MOBIC     oxyCODONE-acetaminophen 10-325 MG per tablet  Commonly known as:  PERCOCET  Take 1 tablet by mouth every 4 (four) hours as needed for pain.     potassium chloride 10 MEQ tablet  Commonly known as:  K-DUR,KLOR-CON  Take 1-2 tablets (10-20 mEq total) by mouth 2 (two) times daily. Take 2 tablets in AM and 1 tablet in PM     ranitidine 300 MG tablet  Commonly known as:  ZANTAC  Take 0.5 tablets (150 mg total) by mouth 2 (two) times daily.     simvastatin 10 MG tablet  Commonly known as:  ZOCOR  Take 1 tablet (10 mg total) by mouth daily.     tamsulosin 0.4 MG Caps capsule  Commonly known as:  FLOMAX  Take 2 capsules (0.8 mg total) by mouth daily.         Signed: Newman Pies  D 11/14/2013, 7:25 AM

## 2013-11-19 DIAGNOSIS — R339 Retention of urine, unspecified: Secondary | ICD-10-CM | POA: Diagnosis not present

## 2013-11-19 DIAGNOSIS — N401 Enlarged prostate with lower urinary tract symptoms: Secondary | ICD-10-CM | POA: Diagnosis not present

## 2013-11-19 DIAGNOSIS — R3915 Urgency of urination: Secondary | ICD-10-CM | POA: Diagnosis not present

## 2013-11-19 DIAGNOSIS — N139 Obstructive and reflux uropathy, unspecified: Secondary | ICD-10-CM | POA: Diagnosis not present

## 2013-11-21 ENCOUNTER — Other Ambulatory Visit: Payer: Self-pay | Admitting: Cardiology

## 2013-11-28 ENCOUNTER — Other Ambulatory Visit: Payer: Self-pay | Admitting: Cardiology

## 2013-11-28 ENCOUNTER — Other Ambulatory Visit: Payer: Self-pay

## 2013-11-28 MED ORDER — POTASSIUM CHLORIDE CRYS ER 10 MEQ PO TBCR: 10.0000 meq | EXTENDED_RELEASE_TABLET | Freq: Two times a day (BID) | ORAL | Status: DC

## 2013-11-28 MED ORDER — BENAZEPRIL HCL 20 MG PO TABS: 20.0000 mg | ORAL_TABLET | Freq: Two times a day (BID) | ORAL | Status: DC

## 2013-12-22 DIAGNOSIS — N139 Obstructive and reflux uropathy, unspecified: Secondary | ICD-10-CM | POA: Diagnosis not present

## 2013-12-22 DIAGNOSIS — R339 Retention of urine, unspecified: Secondary | ICD-10-CM | POA: Diagnosis not present

## 2013-12-22 DIAGNOSIS — N529 Male erectile dysfunction, unspecified: Secondary | ICD-10-CM | POA: Diagnosis not present

## 2013-12-22 DIAGNOSIS — N401 Enlarged prostate with lower urinary tract symptoms: Secondary | ICD-10-CM | POA: Diagnosis not present

## 2013-12-22 DIAGNOSIS — N138 Other obstructive and reflux uropathy: Secondary | ICD-10-CM | POA: Diagnosis not present

## 2013-12-25 ENCOUNTER — Other Ambulatory Visit: Payer: Self-pay | Admitting: Cardiology

## 2014-02-07 ENCOUNTER — Other Ambulatory Visit: Payer: Self-pay | Admitting: Cardiology

## 2014-02-13 DIAGNOSIS — M1710 Unilateral primary osteoarthritis, unspecified knee: Secondary | ICD-10-CM | POA: Diagnosis not present

## 2014-03-18 ENCOUNTER — Other Ambulatory Visit: Payer: Self-pay | Admitting: *Deleted

## 2014-03-18 MED ORDER — AMLODIPINE BESYLATE 2.5 MG PO TABS: ORAL_TABLET | ORAL | Status: DC

## 2014-05-08 DIAGNOSIS — M549 Dorsalgia, unspecified: Secondary | ICD-10-CM | POA: Diagnosis not present

## 2014-05-08 DIAGNOSIS — Z6829 Body mass index (BMI) 29.0-29.9, adult: Secondary | ICD-10-CM | POA: Diagnosis not present

## 2014-05-13 ENCOUNTER — Other Ambulatory Visit (INDEPENDENT_AMBULATORY_CARE_PROVIDER_SITE_OTHER): Payer: Medicare Other

## 2014-05-13 DIAGNOSIS — E785 Hyperlipidemia, unspecified: Secondary | ICD-10-CM | POA: Diagnosis not present

## 2014-05-13 DIAGNOSIS — I10 Essential (primary) hypertension: Secondary | ICD-10-CM | POA: Diagnosis not present

## 2014-05-13 LAB — LIPID PANEL
Cholesterol: 151 mg/dL (ref 0–200)
HDL: 37.5 mg/dL — ABNORMAL LOW (ref >39.00)
LDL Cholesterol: 90 mg/dL (ref 0–99)
NonHDL: 113.5
Total CHOL/HDL Ratio: 4
Triglycerides: 120 mg/dL (ref 0.0–149.0)
VLDL: 24 mg/dL (ref 0.0–40.0)

## 2014-05-13 LAB — HEPATIC FUNCTION PANEL
ALT: 16 U/L (ref 0–53)
AST: 15 U/L (ref 0–37)
Albumin: 4 g/dL (ref 3.5–5.2)
Alkaline Phosphatase: 43 U/L (ref 39–117)
Bilirubin, Direct: 0 mg/dL (ref 0.0–0.3)
Total Bilirubin: 0.8 mg/dL (ref 0.2–1.2)
Total Protein: 6.8 g/dL (ref 6.0–8.3)

## 2014-05-13 LAB — BASIC METABOLIC PANEL
BUN: 16 mg/dL (ref 6–23)
CO2: 29 mEq/L (ref 19–32)
Calcium: 9.3 mg/dL (ref 8.4–10.5)
Chloride: 106 mEq/L (ref 96–112)
Creatinine, Ser: 0.9 mg/dL (ref 0.4–1.5)
GFR: 87.05 mL/min (ref >60.00)
Glucose, Bld: 91 mg/dL (ref 70–99)
Potassium: 4 mEq/L (ref 3.5–5.1)
Sodium: 140 mEq/L (ref 135–145)

## 2014-05-17 ENCOUNTER — Other Ambulatory Visit: Payer: Self-pay | Admitting: Cardiology

## 2014-05-18 NOTE — Telephone Encounter (Signed)
Adrian M Martinique, MD at 11/03/2013  5:36 PM amLODipine (NORVASC) 2.5 MG tablet  Take 2 tablets in the AM and 1 tablet in the PM

## 2014-05-20 ENCOUNTER — Encounter: Payer: Self-pay | Admitting: Cardiology

## 2014-05-20 ENCOUNTER — Ambulatory Visit (INDEPENDENT_AMBULATORY_CARE_PROVIDER_SITE_OTHER): Payer: Medicare Other | Admitting: Cardiology

## 2014-05-20 VITALS — BP 130/80 | HR 66 | Ht 68.0 in | Wt 195.0 lb

## 2014-05-20 DIAGNOSIS — E785 Hyperlipidemia, unspecified: Secondary | ICD-10-CM

## 2014-05-20 DIAGNOSIS — I10 Essential (primary) hypertension: Secondary | ICD-10-CM

## 2014-05-20 NOTE — Patient Instructions (Signed)
Continue your current therapy  I will see you in 6 months.   

## 2014-05-20 NOTE — Progress Notes (Signed)
Adrian Davis Date of Birth: [birth year] Medical Record #[id number]  History of Present Illness: Adrian Davis is seen today for a followup visit. He has a history of HTN and HLD. No known CAD. He had a normal Adenosine back in 2008. His last echo was in January of 2012 showing grade 1 diastolic dysfunction, mild LVH and a normal EF. Since his last visit he underwent successful surgery for lumbar stenosis. He denies any chest pain or SOB. His wife notes he falls asleep easily.  Current Outpatient Prescriptions on File Prior to Visit  Medication Sig Dispense Refill  . amLODipine (NORVASC) 2.5 MG tablet TAKE 2 TABLETS IN THE AM AND 1 TABLET IN THE PM  90 tablet  6  . aspirin EC 81 MG tablet Take 81 mg by mouth daily.      . benazepril (LOTENSIN) 20 MG tablet Take 1 tablet (20 mg total) by mouth 2 (two) times daily.  60 tablet  6  . bisacodyl (DULCOLAX) 5 MG EC tablet Take 5 mg by mouth daily.      . diphenhydrAMINE (BENADRYL) 25 MG tablet Take 25 mg by mouth at bedtime.      . docusate sodium 100 MG CAPS Take 100 mg by mouth 2 (two) times daily.  60 capsule  0  . hydrochlorothiazide (HYDRODIURIL) 25 MG tablet Take 0.5 tablets (12.5 mg total) by mouth daily.  30 tablet  6  . potassium chloride (K-DUR,KLOR-CON) 10 MEQ tablet Take 1-2 tablets (10-20 mEq total) by mouth 2 (two) times daily. Take 2 tablets in AM and 1 tablet in PM  90 tablet  6  . ranitidine (ZANTAC) 300 MG tablet Take 0.5 tablets (150 mg total) by mouth 2 (two) times daily.  60 tablet  6  . simvastatin (ZOCOR) 10 MG tablet Take 1 tablet (10 mg total) by mouth daily.  30 tablet  11   No current facility-administered medications on file prior to visit.    No Known Allergies  Past Medical History  Diagnosis Date  . Hyperlipidemia   . Degenerative disk disease   . Bell palsy Jan 2012    presented with facial numbness  . Edema   . Normal nuclear stress test 2008  . Abnormal echocardiogram Jan 2012    grade 1 diastolic dysfunction,  normal EF, mild LVH  . Lumbar stenosis   . Hypertension     dr Cesiah Westley Davis    Past Surgical History  Procedure Laterality Date  . Shoulder surgery      LEFT  . Tonsillectomy    . Knee arthroscopy      RIGHT  . Lumbar laminectomy/decompression microdiscectomy N/A 11/12/2013    Procedure: LUMBAR LAMINECTOMY/DECOMPRESSION MICRODISCECTOMY 1 LEVEL;  Surgeon: Ophelia Charter, MD;  Location: [facility];  Service: Neurosurgery;  Laterality: N/A;  L4 Laminectomy    History  Smoking status  . Former Smoker -- 1.50 packs/day for 10 years  . Types: Cigarettes  . Quit date: 05/15/1976  Smokeless tobacco  . Current User  . Types: Chew    History  Alcohol Use  . Yes    Comment: occ    Family History  Problem Relation Age of Onset  . Heart attack Father   . Coronary artery disease Father   . Hypertension Father     Review of Systems: The review of systems is per the HPI.  All other systems were reviewed and are negative.  Physical Exam: BP 130/80  Pulse 66  Ht  5\' 8"  (1.727 m)  Wt 195 lb (88.451 kg)  BMI 29.66 kg/m2 Patient is very pleasant and in no acute distress. Skin is warm and dry. Color is normal.  HEENT is unremarkable. Normocephalic/atraumatic. PERRL. Sclera are nonicteric. Neck is supple. No masses. No JVD. Lungs are clear. Cardiac exam shows a regular rate and rhythm. Abdomen is soft. Extremities are without edema. Gait and ROM are intact. No gross neurologic deficits noted.   Laboratory data: Lab Results  Component Value Date   WBC 7.3 11/06/2013   HGB 15.0 11/06/2013   HCT 44.2 11/06/2013   PLT 221 11/06/2013   GLUCOSE 91 05/13/2014   CHOL 151 05/13/2014   TRIG 120.0 05/13/2014   HDL 37.50* 05/13/2014   LDLDIRECT 102.0 05/15/2011   LDLCALC 90 05/13/2014   ALT 16 05/13/2014   AST 15 05/13/2014   NA 140 05/13/2014   K 4.0 05/13/2014   CL 106 05/13/2014   CREATININE 0.9 05/13/2014   BUN 16 05/13/2014   CO2 29 05/13/2014   PSA 1.79 11/12/2012   INR 1.01 11/12/2010    HGBA1C  Value: 5.7 (NOTE)                                                                       According to the ADA Clinical Practice Recommendations for 2011, when HbA1c is used as a screening test:   >=6.5%   Diagnostic of Diabetes Mellitus           (if abnormal result  is confirmed)  5.7-6.4%   Increased risk of developing Diabetes Mellitus  References:Diagnosis and Classification of Diabetes Mellitus,Diabetes DXIP,3825,05(LZJQB 1):S62-S69 and Standards of Medical Care in         Diabetes - 2011,Diabetes Care,2011,34  (Suppl 1):S11-S61.* 11/12/2010      Assessment / Plan: 1. HTN well controlled. Continue amlodipine, HCTZ, and lotensin.  2. Hyperlipidemia. Cholesterol much better since he increased Zocor to daily.  3. Lumbar stenosis. S/p surgery with marked improvement in pain.

## 2014-06-05 DIAGNOSIS — C44310 Basal cell carcinoma of skin of unspecified parts of face: Secondary | ICD-10-CM | POA: Insufficient documentation

## 2014-06-05 DIAGNOSIS — D485 Neoplasm of uncertain behavior of skin: Secondary | ICD-10-CM | POA: Diagnosis not present

## 2014-06-12 DIAGNOSIS — C44319 Basal cell carcinoma of skin of other parts of face: Secondary | ICD-10-CM | POA: Diagnosis not present

## 2014-06-22 ENCOUNTER — Other Ambulatory Visit: Payer: Self-pay

## 2014-06-22 MED ORDER — BENAZEPRIL HCL 20 MG PO TABS: 20.0000 mg | ORAL_TABLET | Freq: Two times a day (BID) | ORAL | Status: DC

## 2014-07-24 DIAGNOSIS — M1712 Unilateral primary osteoarthritis, left knee: Secondary | ICD-10-CM | POA: Diagnosis not present

## 2014-09-24 DIAGNOSIS — M1712 Unilateral primary osteoarthritis, left knee: Secondary | ICD-10-CM | POA: Diagnosis not present

## 2014-09-30 ENCOUNTER — Telehealth: Payer: Self-pay | Admitting: Cardiology

## 2014-09-30 DIAGNOSIS — E785 Hyperlipidemia, unspecified: Secondary | ICD-10-CM

## 2014-09-30 MED ORDER — POTASSIUM CHLORIDE CRYS ER 10 MEQ PO TBCR: 10.0000 meq | EXTENDED_RELEASE_TABLET | Freq: Two times a day (BID) | ORAL | Status: DC

## 2014-09-30 MED ORDER — AMLODIPINE BESYLATE 2.5 MG PO TABS: ORAL_TABLET | ORAL | Status: DC

## 2014-09-30 NOTE — Telephone Encounter (Signed)
Adrian Davis called in stating that some lab orders needs to be put in for the pt and he needs authorization on his Klor-Con and Amlodipine medications for them to be refilled. Please call  Thanks

## 2014-09-30 NOTE — Telephone Encounter (Signed)
Returned call to patient's wife she stated she would like follow up appointment with Dr.Jordan.Appointment scheduled 11/18/14 at 11:15 am.Klorcon and amlodipine refills sent to pharmacy.

## 2014-09-30 NOTE — Addendum Note (Signed)
Addended by: Golden Hurter D on: 09/30/2014 11:09 AM   Modules accepted: Orders

## 2014-09-30 NOTE — Telephone Encounter (Signed)
Fasting lab orders mailed to patient to have done 1 week before appointment with Dr.Jordan.

## 2014-10-05 ENCOUNTER — Telehealth: Payer: Self-pay | Admitting: Cardiology

## 2014-10-05 DIAGNOSIS — R339 Retention of urine, unspecified: Secondary | ICD-10-CM

## 2014-10-05 NOTE — Telephone Encounter (Signed)
Returned call to patient's wife she stated patient also needs psa done with other lab.Psa ordered.

## 2014-10-05 NOTE — Telephone Encounter (Signed)
Please call,she wants the PSA test added to his lab work.

## 2014-11-10 DIAGNOSIS — Z85828 Personal history of other malignant neoplasm of skin: Secondary | ICD-10-CM | POA: Diagnosis not present

## 2014-11-16 DIAGNOSIS — E785 Hyperlipidemia, unspecified: Secondary | ICD-10-CM | POA: Diagnosis not present

## 2014-11-17 DIAGNOSIS — E785 Hyperlipidemia, unspecified: Secondary | ICD-10-CM | POA: Diagnosis not present

## 2014-11-17 DIAGNOSIS — R339 Retention of urine, unspecified: Secondary | ICD-10-CM | POA: Diagnosis not present

## 2014-11-18 ENCOUNTER — Ambulatory Visit (INDEPENDENT_AMBULATORY_CARE_PROVIDER_SITE_OTHER): Payer: Medicare Other | Admitting: Cardiology

## 2014-11-18 ENCOUNTER — Encounter: Payer: Self-pay | Admitting: Cardiology

## 2014-11-18 VITALS — BP 124/80 | HR 75 | Ht 68.0 in | Wt 195.0 lb

## 2014-11-18 DIAGNOSIS — E785 Hyperlipidemia, unspecified: Secondary | ICD-10-CM

## 2014-11-18 DIAGNOSIS — I10 Essential (primary) hypertension: Secondary | ICD-10-CM | POA: Diagnosis not present

## 2014-11-18 LAB — BASIC METABOLIC PANEL
BUN: 17 mg/dL (ref 6–23)
CO2: 28 mEq/L (ref 19–32)
Calcium: 9.4 mg/dL (ref 8.4–10.5)
Chloride: 105 mEq/L (ref 96–112)
Creat: 0.88 mg/dL (ref 0.50–1.35)
Glucose, Bld: 85 mg/dL (ref 70–99)
Potassium: 4.8 mEq/L (ref 3.5–5.3)
Sodium: 140 mEq/L (ref 135–145)

## 2014-11-18 LAB — LIPID PANEL
Cholesterol: 159 mg/dL (ref 0–200)
HDL: 37 mg/dL — ABNORMAL LOW (ref >39)
LDL Cholesterol: 89 mg/dL (ref 0–99)
Total CHOL/HDL Ratio: 4.3 Ratio
Triglycerides: 163 mg/dL — ABNORMAL HIGH (ref <150)
VLDL: 33 mg/dL (ref 0–40)

## 2014-11-18 LAB — HEPATIC FUNCTION PANEL
ALT: 18 U/L (ref 0–53)
AST: 14 U/L (ref 0–37)
Albumin: 4.3 g/dL (ref 3.5–5.2)
Alkaline Phosphatase: 55 U/L (ref 39–117)
Bilirubin, Direct: 0.1 mg/dL (ref 0.0–0.3)
Indirect Bilirubin: 0.4 mg/dL (ref 0.2–1.2)
Total Bilirubin: 0.5 mg/dL (ref 0.2–1.2)
Total Protein: 6.7 g/dL (ref 6.0–8.3)

## 2014-11-18 LAB — PSA: PSA: 1.18 ng/mL (ref <=4.00)

## 2014-11-18 NOTE — Progress Notes (Signed)
Adrian Davis Date of Birth: Aug 29, 1941 Medical Record #[id number]  History of Present Illness: Adrian Davis is seen today for follow up of HTN. He has a history of HTN and HLD. No known CAD. He had a normal Adenosine back in 2008. His last echo was in January of 2012 showing grade 1 diastolic dysfunction, mild LVH and a normal EF.  He denies any chest pain or SOB. No complaints today.   Current Outpatient Prescriptions on File Prior to Visit  Medication Sig Dispense Refill  . amLODipine (NORVASC) 2.5 MG tablet Take 2 tablets in am and 1 tablet in pm. 90 tablet 6  . aspirin EC 81 MG tablet Take 81 mg by mouth daily.    . benazepril (LOTENSIN) 20 MG tablet Take 1 tablet (20 mg total) by mouth 2 (two) times daily. 60 tablet 6  . bisacodyl (DULCOLAX) 5 MG EC tablet Take 5 mg by mouth daily.    . diphenhydrAMINE (BENADRYL) 25 MG tablet Take 25 mg by mouth at bedtime.    . docusate sodium 100 MG CAPS Take 100 mg by mouth 2 (two) times daily. 60 capsule 0  . hydrochlorothiazide (HYDRODIURIL) 25 MG tablet Take 0.5 tablets (12.5 mg total) by mouth daily. 30 tablet 6  . potassium chloride (K-DUR,KLOR-CON) 10 MEQ tablet Take 1-2 tablets (10-20 mEq total) by mouth 2 (two) times daily. Take 2 tablets in AM and 1 tablet in PM 90 tablet 6  . ranitidine (ZANTAC) 300 MG tablet Take 0.5 tablets (150 mg total) by mouth 2 (two) times daily. 60 tablet 6  . simvastatin (ZOCOR) 10 MG tablet Take 1 tablet (10 mg total) by mouth daily. 30 tablet 11  . tamsulosin (FLOMAX) 0.4 MG CAPS capsule Take 1 -2 capsules daily     No current facility-administered medications on file prior to visit.    No Known Allergies  Past Medical History  Diagnosis Date  . Hyperlipidemia   . Degenerative disk disease   . Bell palsy Jan 2012    presented with facial numbness  . Edema   . Normal nuclear stress test 2008  . Abnormal echocardiogram Jan 2012    grade 1 diastolic dysfunction, normal EF, mild LVH  . Lumbar stenosis   .  Hypertension     dr [name]    Past Surgical History  Procedure Laterality Date  . Shoulder surgery      LEFT  . Tonsillectomy    . Knee arthroscopy      RIGHT  . Lumbar laminectomy/decompression microdiscectomy N/A 11/12/2013    Procedure: LUMBAR LAMINECTOMY/DECOMPRESSION MICRODISCECTOMY 1 LEVEL;  Surgeon: Ophelia Charter, MD;  Location: [facility];  Service: Neurosurgery;  Laterality: N/A;  L4 Laminectomy    History  Smoking status  . Former Smoker -- 1.50 packs/day for 10 years  . Types: Cigarettes  . Quit date: 05/15/1976  Smokeless tobacco  . Current User  . Types: Chew    History  Alcohol Use  . Yes    Comment: occ    Family History  Problem Relation Age of Onset  . Heart attack Father   . Coronary artery disease Father   . Hypertension Father     Review of Systems: The review of systems is per the HPI.  All other systems were reviewed and are negative.  Physical Exam: BP 124/80 mmHg  Pulse 75  Ht 5\' 8"  (1.727 m)  Wt 195 lb (88.451 kg)  BMI 29.66 kg/m2 Patient is very pleasant  and in no acute distress. Skin is warm and dry. Color is normal.  HEENT is unremarkable. Normocephalic/atraumatic. PERRL. Sclera are nonicteric. Neck is supple. No masses. No JVD. Lungs are clear. Cardiac exam shows a regular rate and rhythm. Abdomen is soft. Extremities are without edema. Gait and ROM are intact. No gross neurologic deficits noted.   Laboratory data: Lab Results  Component Value Date   WBC 7.3 11/06/2013   HGB 15.0 11/06/2013   HCT 44.2 11/06/2013   PLT 221 11/06/2013   GLUCOSE 85 11/17/2014   CHOL 159 11/17/2014   TRIG 163* 11/17/2014   HDL 37* 11/17/2014   LDLDIRECT 102.0 05/15/2011   LDLCALC 89 11/17/2014   ALT 18 11/16/2014   AST 14 11/16/2014   NA 140 11/17/2014   K 4.8 11/17/2014   CL 105 11/17/2014   CREATININE 0.88 11/17/2014   BUN 17 11/17/2014   CO2 28 11/17/2014   PSA 1.18 11/17/2014   INR 1.01 11/12/2010   HGBA1C * 11/12/2010     5.7 (NOTE)                                                                       According to the ADA Clinical Practice Recommendations for 2011, when HbA1c is used as a screening test:   >=6.5%   Diagnostic of Diabetes Mellitus           (if abnormal result  is confirmed)  5.7-6.4%   Increased risk of developing Diabetes Mellitus  References:Diagnosis and Classification of Diabetes Mellitus,Diabetes RCVE,9381,01(BPZWC 1):S62-S69 and Standards of Medical Care in         Diabetes - 2011,Diabetes Care,2011,34  (Suppl 1):S11-S61.    Ecg today shows NSR with LVH. I have personally reviewed and interpreted this study.   Assessment / Plan: 1. HTN well controlled. Continue amlodipine, HCTZ, and lotensin.  2. Hyperlipidemia. Labs acceptable. Encourage weight loss.  Follow up in 6 months.

## 2014-11-18 NOTE — Patient Instructions (Signed)
Continue your current therapy  I will see you in 6 months.   

## 2014-11-19 ENCOUNTER — Other Ambulatory Visit: Payer: Self-pay

## 2014-11-19 MED ORDER — AMLODIPINE BESYLATE 2.5 MG PO TABS: ORAL_TABLET | ORAL | Status: DC

## 2014-11-19 MED ORDER — HYDROCHLOROTHIAZIDE 25 MG PO TABS: 12.5000 mg | ORAL_TABLET | Freq: Every day | ORAL | Status: DC

## 2014-11-20 ENCOUNTER — Other Ambulatory Visit: Payer: Self-pay | Admitting: *Deleted

## 2014-11-20 MED ORDER — BENAZEPRIL HCL 20 MG PO TABS: 20.0000 mg | ORAL_TABLET | Freq: Two times a day (BID) | ORAL | Status: DC

## 2014-11-23 ENCOUNTER — Ambulatory Visit: Payer: Medicare Other | Admitting: Nurse Practitioner

## 2014-11-24 ENCOUNTER — Other Ambulatory Visit: Payer: Self-pay | Admitting: *Deleted

## 2014-11-24 DIAGNOSIS — Z85828 Personal history of other malignant neoplasm of skin: Secondary | ICD-10-CM | POA: Diagnosis not present

## 2014-11-24 MED ORDER — SIMVASTATIN 10 MG PO TABS: 10.0000 mg | ORAL_TABLET | Freq: Every day | ORAL | Status: DC

## 2014-12-22 DIAGNOSIS — Z85828 Personal history of other malignant neoplasm of skin: Secondary | ICD-10-CM | POA: Diagnosis not present

## 2015-01-07 DIAGNOSIS — Z139 Encounter for screening, unspecified: Secondary | ICD-10-CM | POA: Diagnosis not present

## 2015-01-13 DIAGNOSIS — M1712 Unilateral primary osteoarthritis, left knee: Secondary | ICD-10-CM | POA: Diagnosis not present

## 2015-03-09 DIAGNOSIS — Z720 Tobacco use: Secondary | ICD-10-CM | POA: Diagnosis not present

## 2015-03-09 DIAGNOSIS — R490 Dysphonia: Secondary | ICD-10-CM | POA: Diagnosis not present

## 2015-03-09 DIAGNOSIS — H833X3 Noise effects on inner ear, bilateral: Secondary | ICD-10-CM | POA: Diagnosis not present

## 2015-03-09 DIAGNOSIS — H6121 Impacted cerumen, right ear: Secondary | ICD-10-CM | POA: Diagnosis not present

## 2015-03-18 IMAGING — CR DG CHEST 2V
2 series · 2 of 2 positions shown · non-contrast
Comparison: 07/01/2007.

CLINICAL DATA: Hypertension.

EXAM:
CHEST  2 VIEW

[w chest pa]
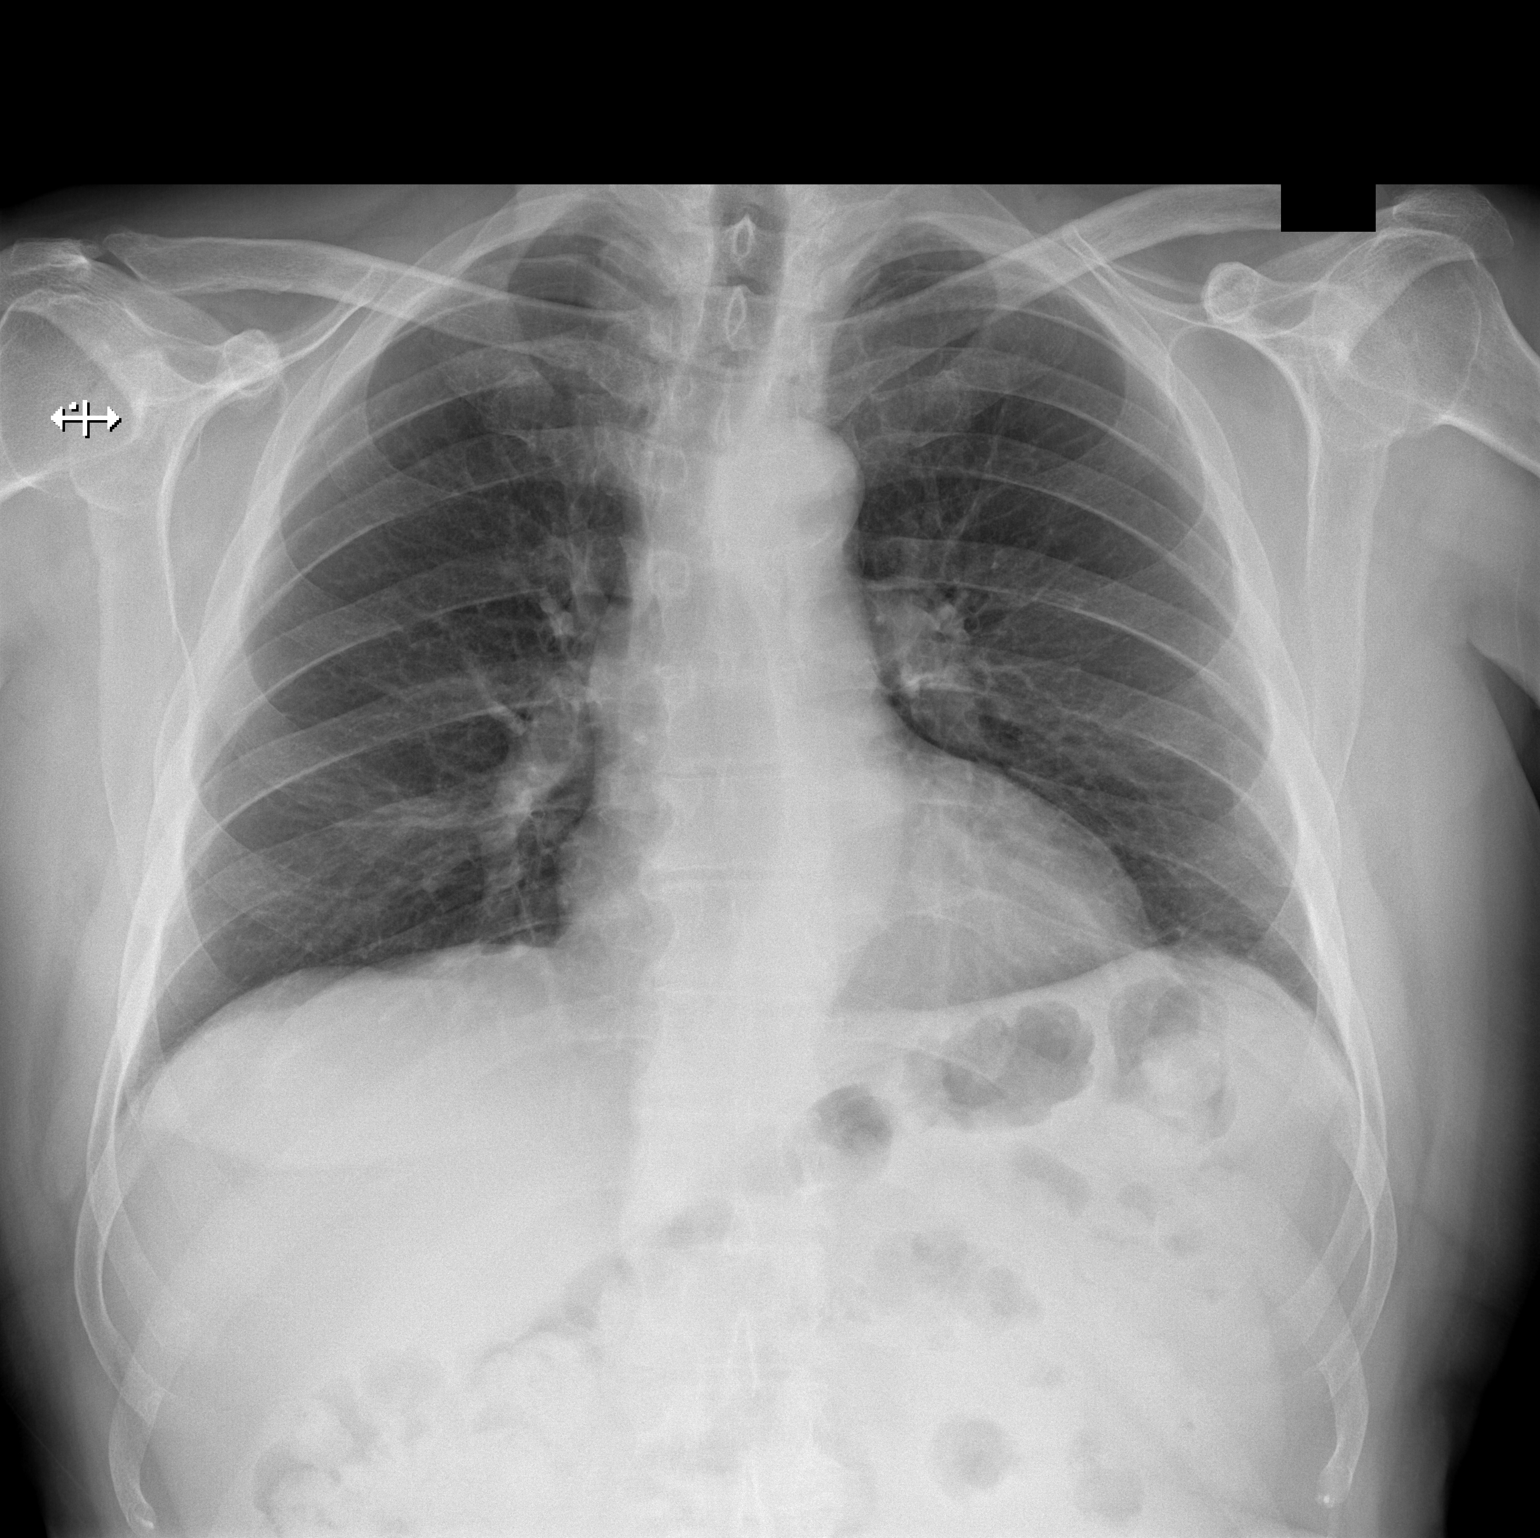

[w chest lat]
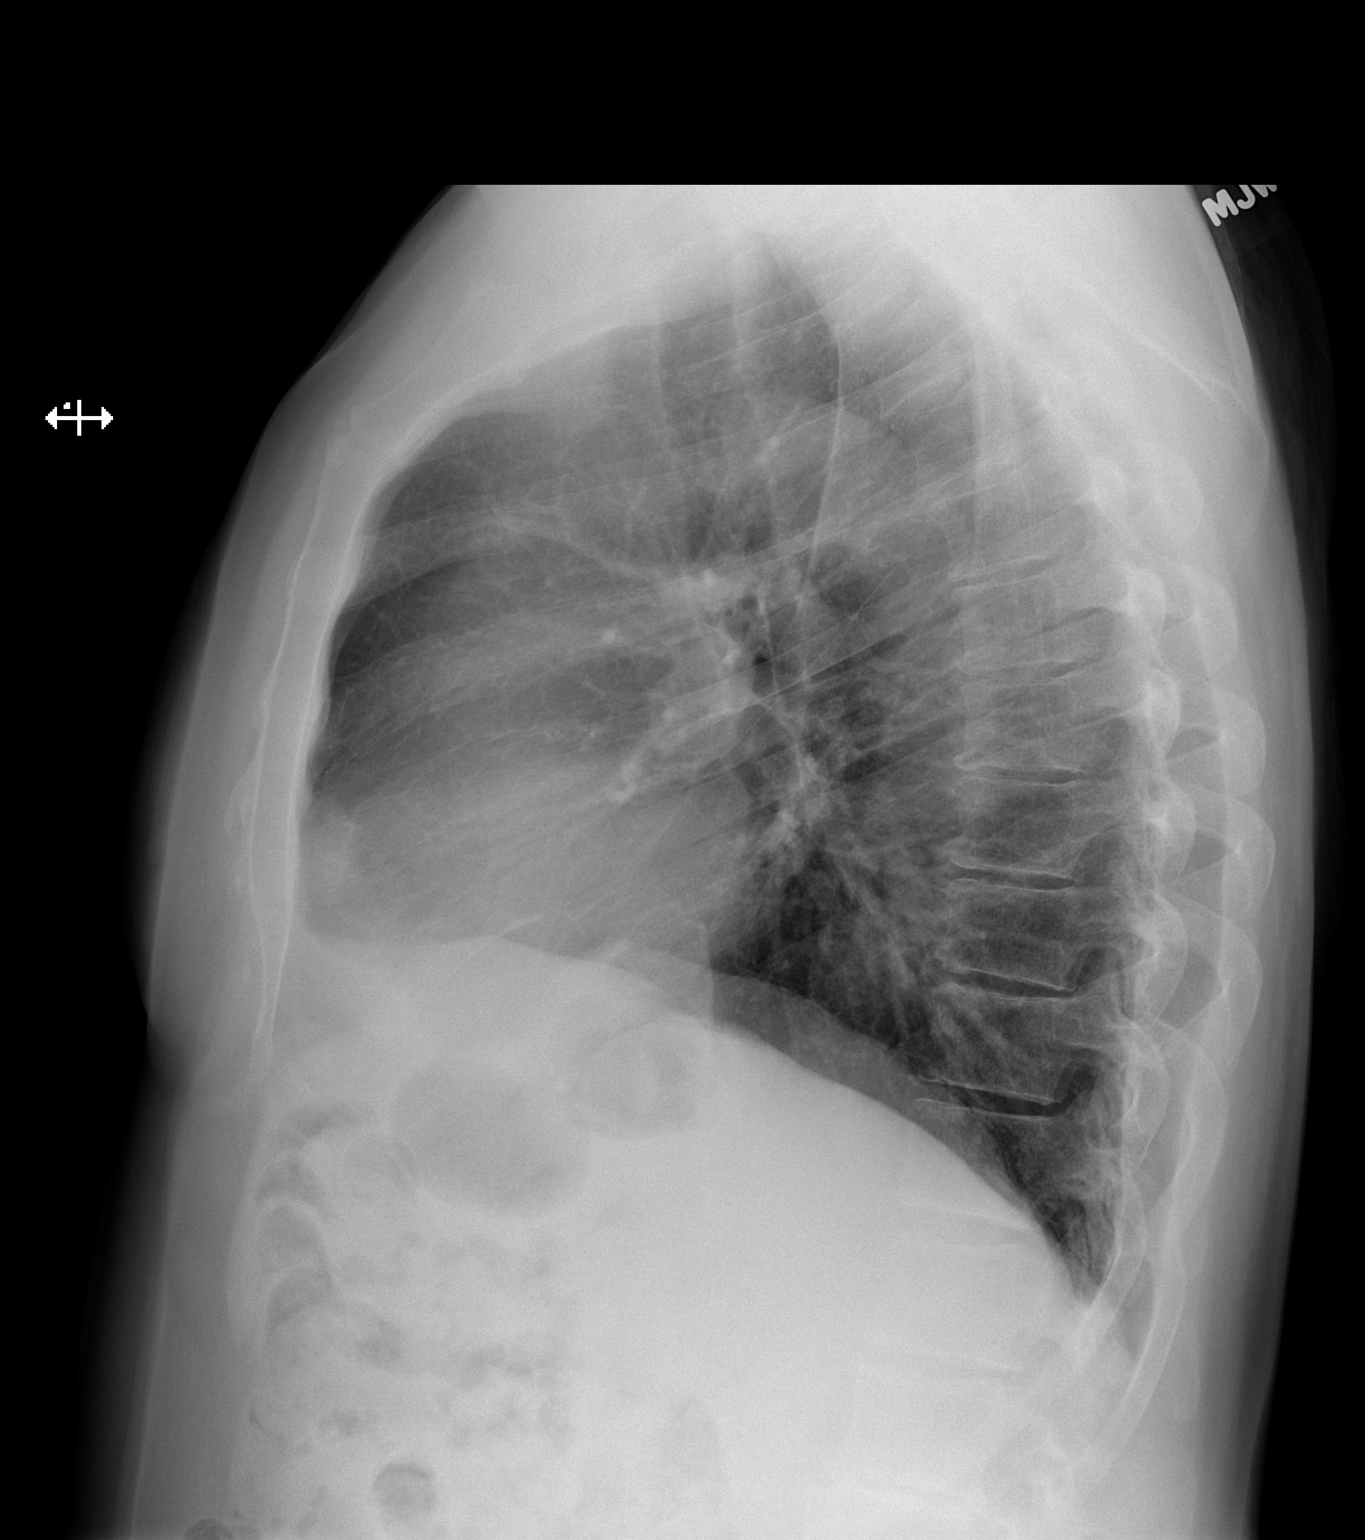

[2 of 2 positions shown; findings below may reference images not displayed]

FINDINGS: Mild bibasilar atelectasis and/or scarring noted. No acute
cardiopulmonary disease. Heart size is stable. No pleural effusion
or pneumothorax. Degenerative changes thoracic spine .
IMPRESSION: Mild stable basilar atelectasis and/or scarring. No active
cardiopulmonary disease.

## 2015-03-31 DIAGNOSIS — N401 Enlarged prostate with lower urinary tract symptoms: Secondary | ICD-10-CM | POA: Diagnosis not present

## 2015-03-31 DIAGNOSIS — N5201 Erectile dysfunction due to arterial insufficiency: Secondary | ICD-10-CM | POA: Diagnosis not present

## 2015-03-31 DIAGNOSIS — R3915 Urgency of urination: Secondary | ICD-10-CM | POA: Diagnosis not present

## 2015-03-31 DIAGNOSIS — N138 Other obstructive and reflux uropathy: Secondary | ICD-10-CM | POA: Diagnosis not present

## 2015-04-20 ENCOUNTER — Other Ambulatory Visit: Payer: Self-pay

## 2015-04-20 MED ORDER — POTASSIUM CHLORIDE CRYS ER 10 MEQ PO TBCR: EXTENDED_RELEASE_TABLET | ORAL | Status: DC

## 2015-04-20 NOTE — Telephone Encounter (Signed)
Per note 1.27.16

## 2015-04-28 ENCOUNTER — Other Ambulatory Visit: Payer: Self-pay

## 2015-04-28 DIAGNOSIS — E785 Hyperlipidemia, unspecified: Secondary | ICD-10-CM

## 2015-04-28 MED ORDER — AMLODIPINE BESYLATE 2.5 MG PO TABS: ORAL_TABLET | ORAL | Status: DC

## 2015-04-28 MED ORDER — HYDROCHLOROTHIAZIDE 25 MG PO TABS: 12.5000 mg | ORAL_TABLET | Freq: Every day | ORAL | Status: DC

## 2015-04-28 MED ORDER — SIMVASTATIN 10 MG PO TABS: 10.0000 mg | ORAL_TABLET | Freq: Every day | ORAL | Status: DC

## 2015-04-28 MED ORDER — POTASSIUM CHLORIDE CRYS ER 10 MEQ PO TBCR: EXTENDED_RELEASE_TABLET | ORAL | Status: DC

## 2015-04-28 MED ORDER — RANITIDINE HCL 300 MG PO TABS: 150.0000 mg | ORAL_TABLET | Freq: Two times a day (BID) | ORAL | Status: DC

## 2015-04-28 MED ORDER — BENAZEPRIL HCL 20 MG PO TABS: 20.0000 mg | ORAL_TABLET | Freq: Two times a day (BID) | ORAL | Status: DC

## 2015-04-29 DIAGNOSIS — N5201 Erectile dysfunction due to arterial insufficiency: Secondary | ICD-10-CM | POA: Diagnosis not present

## 2015-04-29 DIAGNOSIS — R3915 Urgency of urination: Secondary | ICD-10-CM | POA: Diagnosis not present

## 2015-04-29 DIAGNOSIS — N138 Other obstructive and reflux uropathy: Secondary | ICD-10-CM | POA: Diagnosis not present

## 2015-04-29 DIAGNOSIS — R351 Nocturia: Secondary | ICD-10-CM | POA: Diagnosis not present

## 2015-04-29 DIAGNOSIS — N401 Enlarged prostate with lower urinary tract symptoms: Secondary | ICD-10-CM | POA: Diagnosis not present

## 2015-06-02 DIAGNOSIS — E785 Hyperlipidemia, unspecified: Secondary | ICD-10-CM | POA: Diagnosis not present

## 2015-06-03 LAB — HEPATIC FUNCTION PANEL
ALT: 18 U/L (ref 9–46)
AST: 16 U/L (ref 10–35)
Albumin: 4.2 g/dL (ref 3.6–5.1)
Alkaline Phosphatase: 47 U/L (ref 40–115)
Bilirubin, Direct: 0.1 mg/dL (ref <=0.2)
Indirect Bilirubin: 0.4 mg/dL (ref 0.2–1.2)
Total Bilirubin: 0.5 mg/dL (ref 0.2–1.2)
Total Protein: 6.6 g/dL (ref 6.1–8.1)

## 2015-06-03 LAB — BASIC METABOLIC PANEL
BUN: 18 mg/dL (ref 7–25)
CO2: 23 mmol/L (ref 20–31)
Calcium: 9.5 mg/dL (ref 8.6–10.3)
Chloride: 107 mmol/L (ref 98–110)
Creat: 0.88 mg/dL (ref 0.70–1.18)
Glucose, Bld: 105 mg/dL — ABNORMAL HIGH (ref 65–99)
Potassium: 4 mmol/L (ref 3.5–5.3)
Sodium: 140 mmol/L (ref 135–146)

## 2015-06-03 LAB — LIPID PANEL
Cholesterol: 157 mg/dL (ref 125–200)
HDL: 38 mg/dL — ABNORMAL LOW (ref >=40)
LDL Cholesterol: 91 mg/dL (ref <130)
Total CHOL/HDL Ratio: 4.1 Ratio (ref <=5.0)
Triglycerides: 141 mg/dL (ref <150)
VLDL: 28 mg/dL (ref <30)

## 2015-06-09 ENCOUNTER — Ambulatory Visit (INDEPENDENT_AMBULATORY_CARE_PROVIDER_SITE_OTHER): Payer: Medicare Other | Admitting: Cardiology

## 2015-06-09 ENCOUNTER — Encounter: Payer: Self-pay | Admitting: Cardiology

## 2015-06-09 VITALS — BP 118/70 | HR 70 | Ht 69.0 in | Wt 192.1 lb

## 2015-06-09 DIAGNOSIS — I10 Essential (primary) hypertension: Secondary | ICD-10-CM | POA: Diagnosis not present

## 2015-06-09 DIAGNOSIS — E785 Hyperlipidemia, unspecified: Secondary | ICD-10-CM

## 2015-06-09 NOTE — Progress Notes (Signed)
Adrian Davis Date of Birth: November 27, 1940 Medical Record #[id number]  History of Present Illness: Adrian Davis is seen today for follow up of HTN. He has a history of HTN and HLD. No known CAD. He had a normal Adenosine Myoview back in 2008. His last echo was in January of 2012 showing grade 1 diastolic dysfunction, mild LVH and a normal EF.  He denies any chest pain or SOB. No complaints today. States he feels great.  Current Outpatient Prescriptions on File Prior to Visit  Medication Sig Dispense Refill  . amLODipine (NORVASC) 2.5 MG tablet Take 2 tablets in am and 1 tablet in pm. 90 tablet 6  . aspirin EC 81 MG tablet Take 81 mg by mouth daily.    . benazepril (LOTENSIN) 20 MG tablet Take 1 tablet (20 mg total) by mouth 2 (two) times daily. 60 tablet 6  . bisacodyl (DULCOLAX) 5 MG EC tablet Take 5 mg by mouth daily.    . diphenhydrAMINE (BENADRYL) 25 MG tablet Take 25 mg by mouth at bedtime.    . docusate sodium 100 MG CAPS Take 100 mg by mouth 2 (two) times daily. 60 capsule 0  . hydrochlorothiazide (HYDRODIURIL) 25 MG tablet Take 0.5 tablets (12.5 mg total) by mouth daily. 30 tablet 6  . potassium chloride (K-DUR,KLOR-CON) 10 MEQ tablet Take 2 tablets in AM and 1 tablet in PM 90 tablet 6  . ranitidine (ZANTAC) 300 MG tablet Take 0.5 tablets (150 mg total) by mouth 2 (two) times daily. 60 tablet 6  . simvastatin (ZOCOR) 10 MG tablet Take 1 tablet (10 mg total) by mouth daily. 30 tablet 6  . tamsulosin (FLOMAX) 0.4 MG CAPS capsule Take 1 -2 capsules daily     No current facility-administered medications on file prior to visit.    No Known Allergies  Past Medical History  Diagnosis Date  . Hyperlipidemia   . Degenerative disk disease   . Bell palsy Jan 2012    presented with facial numbness  . Edema   . Normal nuclear stress test 2008  . Abnormal echocardiogram Jan 2012    grade 1 diastolic dysfunction, normal EF, mild LVH  . Lumbar stenosis   . Hypertension     dr [name]     Past Surgical History  Procedure Laterality Date  . Shoulder surgery      LEFT  . Tonsillectomy    . Knee arthroscopy      RIGHT  . Lumbar laminectomy/decompression microdiscectomy N/A 11/12/2013    Procedure: LUMBAR LAMINECTOMY/DECOMPRESSION MICRODISCECTOMY 1 LEVEL;  Surgeon: Ophelia Charter, MD;  Location: [facility];  Service: Neurosurgery;  Laterality: N/A;  L4 Laminectomy    History  Smoking status  . Former Smoker -- 1.50 packs/day for 10 years  . Types: Cigarettes  . Quit date: 05/15/1976  Smokeless tobacco  . Current User  . Types: Chew    History  Alcohol Use  . Yes    Comment: occ    Family History  Problem Relation Age of Onset  . Heart attack Father   . Coronary artery disease Father   . Hypertension Father     Review of Systems: The review of systems is per the HPI.  All other systems were reviewed and are negative.  Physical Exam: BP 118/70 mmHg  Pulse 70  Ht 5\' 9"  (1.753 m)  Wt 87.136 kg (192 lb 1.6 oz)  BMI 28.36 kg/m2 Patient is very pleasant and in no acute distress. Skin  is warm and dry. Color is normal.  HEENT is unremarkable. Normocephalic/atraumatic. PERRL. Sclera are nonicteric. Neck is supple. No masses. No JVD. Lungs are clear. Cardiac exam shows a regular rate and rhythm. Abdomen is soft. Extremities are without edema. Gait and ROM are intact. No gross neurologic deficits noted.   Laboratory data: Lab Results  Component Value Date   WBC 7.3 11/06/2013   HGB 15.0 11/06/2013   HCT 44.2 11/06/2013   PLT 221 11/06/2013   GLUCOSE 105* 06/02/2015   CHOL 157 06/02/2015   TRIG 141 06/02/2015   HDL 38* 06/02/2015   LDLDIRECT 102.0 05/15/2011   LDLCALC 91 06/02/2015   ALT 18 06/02/2015   AST 16 06/02/2015   NA 140 06/02/2015   K 4.0 06/02/2015   CL 107 06/02/2015   CREATININE 0.88 06/02/2015   BUN 18 06/02/2015   CO2 23 06/02/2015   PSA 1.18 11/17/2014   INR 1.01 11/12/2010   HGBA1C * 11/12/2010    5.7 (NOTE)                                                                        According to the ADA Clinical Practice Recommendations for 2011, when HbA1c is used as a screening test:   >=6.5%   Diagnostic of Diabetes Mellitus           (if abnormal result  is confirmed)  5.7-6.4%   Increased risk of developing Diabetes Mellitus  References:Diagnosis and Classification of Diabetes Mellitus,Diabetes MIWO,0321,22(QMGNO 1):S62-S69 and Standards of Medical Care in         Diabetes - 2011,Diabetes Care,2011,34  (Suppl 1):S11-S61.      Assessment / Plan: 1. HTN well controlled. Continue amlodipine, HCTZ, and lotensin.  2. Hyperlipidemia. Labs acceptable. Continue current therapy  Follow up in 6 months.

## 2015-06-09 NOTE — Patient Instructions (Signed)
Continue your current therapy  I will see you in 6 months.   

## 2015-06-21 ENCOUNTER — Other Ambulatory Visit: Payer: Self-pay | Admitting: *Deleted

## 2015-06-21 MED ORDER — POTASSIUM CHLORIDE CRYS ER 10 MEQ PO TBCR: EXTENDED_RELEASE_TABLET | ORAL | Status: DC

## 2015-06-21 MED ORDER — BENAZEPRIL HCL 20 MG PO TABS: 20.0000 mg | ORAL_TABLET | Freq: Two times a day (BID) | ORAL | Status: DC

## 2015-09-20 DIAGNOSIS — Z23 Encounter for immunization: Secondary | ICD-10-CM | POA: Diagnosis not present

## 2015-10-06 DIAGNOSIS — M1712 Unilateral primary osteoarthritis, left knee: Secondary | ICD-10-CM | POA: Diagnosis not present

## 2015-10-13 DIAGNOSIS — M1712 Unilateral primary osteoarthritis, left knee: Secondary | ICD-10-CM | POA: Diagnosis not present

## 2015-10-20 DIAGNOSIS — M1712 Unilateral primary osteoarthritis, left knee: Secondary | ICD-10-CM | POA: Diagnosis not present

## 2015-10-29 ENCOUNTER — Telehealth: Payer: Self-pay | Admitting: Cardiology

## 2015-10-29 NOTE — Telephone Encounter (Signed)
Noted  

## 2015-10-29 NOTE — Telephone Encounter (Signed)
Pt's wife called in stating that she and the pt will be changing their pharmacy to [facility] on [address] in [place]. Please note  Thanks

## 2015-11-22 ENCOUNTER — Other Ambulatory Visit: Payer: Self-pay | Admitting: *Deleted

## 2015-11-22 ENCOUNTER — Other Ambulatory Visit: Payer: Self-pay | Admitting: Cardiology

## 2015-11-22 MED ORDER — HYDROCHLOROTHIAZIDE 25 MG PO TABS: 12.5000 mg | ORAL_TABLET | Freq: Every day | ORAL | Status: DC

## 2015-11-22 MED ORDER — AMLODIPINE BESYLATE 2.5 MG PO TABS: ORAL_TABLET | ORAL | Status: DC

## 2015-11-22 NOTE — Telephone Encounter (Signed)
Rx request sent to pharmacy.  

## 2015-11-22 NOTE — Telephone Encounter (Signed)
Pharmacy requesting a refill on hydrochlorothiazide 25 mg tablet, refill expired. Please advise

## 2015-11-22 NOTE — Telephone Encounter (Signed)
Refill sent.

## 2015-11-23 ENCOUNTER — Telehealth: Payer: Self-pay | Admitting: Cardiology

## 2015-11-23 MED ORDER — AMLODIPINE BESYLATE 2.5 MG PO TABS: ORAL_TABLET | ORAL | Status: DC

## 2015-11-23 NOTE — Telephone Encounter (Signed)
Returned call to patient's wife.90 day refill for amlodipine phoned to pharmacy.Pharmacist stated does not require PA.

## 2015-11-23 NOTE — Telephone Encounter (Signed)
0New Message  Prior auth for amLODipine (NORVASC) 2.5 MG tablet  Insurance is still [facility]. Drug store has change [facility] [address] [phone]  Pt didn't have fax number.   [employer] made the decision to move to [facility]. 90 day script.

## 2015-12-27 ENCOUNTER — Other Ambulatory Visit: Payer: Self-pay | Admitting: *Deleted

## 2015-12-27 MED ORDER — SIMVASTATIN 10 MG PO TABS: 10.0000 mg | ORAL_TABLET | Freq: Every day | ORAL | Status: DC

## 2016-01-24 ENCOUNTER — Other Ambulatory Visit: Payer: Self-pay | Admitting: *Deleted

## 2016-01-24 ENCOUNTER — Telehealth: Payer: Self-pay | Admitting: Cardiology

## 2016-01-24 DIAGNOSIS — R39198 Other difficulties with micturition: Secondary | ICD-10-CM

## 2016-01-24 DIAGNOSIS — I10 Essential (primary) hypertension: Secondary | ICD-10-CM

## 2016-01-24 DIAGNOSIS — E785 Hyperlipidemia, unspecified: Secondary | ICD-10-CM

## 2016-01-24 DIAGNOSIS — R972 Elevated prostate specific antigen [PSA]: Secondary | ICD-10-CM

## 2016-01-24 MED ORDER — BENAZEPRIL HCL 20 MG PO TABS: 20.0000 mg | ORAL_TABLET | Freq: Two times a day (BID) | ORAL | Status: DC

## 2016-01-24 NOTE — Telephone Encounter (Signed)
Returned call to patient's wife requesting fasting lab before appointment with Dr.Jordan 01/31/16.Lab orders mailed.

## 2016-01-24 NOTE — Telephone Encounter (Signed)
New message    Wife calling in reference to setting up lab work.

## 2016-01-27 DIAGNOSIS — I10 Essential (primary) hypertension: Secondary | ICD-10-CM | POA: Diagnosis not present

## 2016-01-27 DIAGNOSIS — R39198 Other difficulties with micturition: Secondary | ICD-10-CM | POA: Diagnosis not present

## 2016-01-27 DIAGNOSIS — R972 Elevated prostate specific antigen [PSA]: Secondary | ICD-10-CM | POA: Diagnosis not present

## 2016-01-27 DIAGNOSIS — E785 Hyperlipidemia, unspecified: Secondary | ICD-10-CM | POA: Diagnosis not present

## 2016-01-27 LAB — CBC WITH DIFFERENTIAL/PLATELET
Basophils Absolute: 62 cells/uL (ref 0–200)
Basophils Relative: 1 %
Eosinophils Absolute: 124 cells/uL (ref 15–500)
Eosinophils Relative: 2 %
HCT: 43.8 % (ref 38.5–50.0)
Hemoglobin: 14.3 g/dL (ref 13.2–17.1)
Lymphocytes Relative: 38 %
Lymphs Abs: 2356 cells/uL (ref 850–3900)
MCH: 29.7 pg (ref 27.0–33.0)
MCHC: 32.6 g/dL (ref 32.0–36.0)
MCV: 90.9 fL (ref 80.0–100.0)
MPV: 9.8 fL (ref 7.5–12.5)
Monocytes Absolute: 558 cells/uL (ref 200–950)
Monocytes Relative: 9 %
Neutro Abs: 3100 cells/uL (ref 1500–7800)
Neutrophils Relative %: 50 %
Platelets: 241 10*3/uL (ref 140–400)
RBC: 4.82 MIL/uL (ref 4.20–5.80)
RDW: 14.1 % (ref 11.0–15.0)
WBC: 6.2 10*3/uL (ref 3.8–10.8)

## 2016-01-28 LAB — BASIC METABOLIC PANEL
BUN: 13 mg/dL (ref 7–25)
CO2: 23 mmol/L (ref 20–31)
Calcium: 9.1 mg/dL (ref 8.6–10.3)
Chloride: 106 mmol/L (ref 98–110)
Creat: 0.85 mg/dL (ref 0.70–1.18)
Glucose, Bld: 90 mg/dL (ref 65–99)
Potassium: 4.4 mmol/L (ref 3.5–5.3)
Sodium: 139 mmol/L (ref 135–146)

## 2016-01-28 LAB — PSA: PSA: 1.69 ng/mL (ref <=4.00)

## 2016-01-28 LAB — LIPID PANEL
Cholesterol: 149 mg/dL (ref 125–200)
HDL: 38 mg/dL — ABNORMAL LOW (ref >=40)
LDL Cholesterol: 83 mg/dL (ref <130)
Total CHOL/HDL Ratio: 3.9 Ratio (ref <=5.0)
Triglycerides: 140 mg/dL (ref <150)
VLDL: 28 mg/dL (ref <30)

## 2016-01-28 LAB — HEPATIC FUNCTION PANEL
ALT: 17 U/L (ref 9–46)
AST: 15 U/L (ref 10–35)
Albumin: 4.1 g/dL (ref 3.6–5.1)
Alkaline Phosphatase: 51 U/L (ref 40–115)
Bilirubin, Direct: 0.1 mg/dL (ref <=0.2)
Indirect Bilirubin: 0.3 mg/dL (ref 0.2–1.2)
Total Bilirubin: 0.4 mg/dL (ref 0.2–1.2)
Total Protein: 6.2 g/dL (ref 6.1–8.1)

## 2016-01-31 ENCOUNTER — Encounter: Payer: Self-pay | Admitting: Cardiology

## 2016-01-31 ENCOUNTER — Ambulatory Visit (INDEPENDENT_AMBULATORY_CARE_PROVIDER_SITE_OTHER): Payer: Medicare Other | Admitting: Cardiology

## 2016-01-31 VITALS — BP 126/74 | HR 68 | Ht 69.0 in | Wt 194.4 lb

## 2016-01-31 DIAGNOSIS — I10 Essential (primary) hypertension: Secondary | ICD-10-CM

## 2016-01-31 DIAGNOSIS — E785 Hyperlipidemia, unspecified: Secondary | ICD-10-CM | POA: Diagnosis not present

## 2016-01-31 NOTE — Patient Instructions (Addendum)
Continue your current therapy except reduce Norvasc to 2.5 mg twice a day  I will see you in 6 months

## 2016-01-31 NOTE — Progress Notes (Signed)
Adrian Davis Date of Birth: 1941/09/13 Medical Record [id number]  History of Present Illness: Adrian Davis is seen today for follow up of HTN. He has a history of HTN and HLD. No known CAD. He had a normal Adenosine Myoview back in 2008. His last echo was in January of 2012 showing grade 1 diastolic dysfunction, mild LVH and a normal EF.  He denies any chest pain or SOB. No complaints today. He does note some swelling in his legs at the end of the day L>R. Notes arthritic pain in left knee and had injections in December for this.   Current Outpatient Prescriptions on File Prior to Visit  Medication Sig Dispense Refill  . amLODipine (NORVASC) 2.5 MG tablet Take 2 tablets in am and 1 tablet in pm. 270 tablet 3  . aspirin EC 81 MG tablet Take 81 mg by mouth daily.    . benazepril (LOTENSIN) 20 MG tablet Take 1 tablet (20 mg total) by mouth 2 (two) times daily. 60 tablet 4  . diphenhydrAMINE (BENADRYL) 25 MG tablet Take 25 mg by mouth at bedtime.    . docusate sodium 100 MG CAPS Take 100 mg by mouth 2 (two) times daily. 60 capsule 0  . hydrochlorothiazide (HYDRODIURIL) 25 MG tablet Take 0.5 tablets (12.5 mg total) by mouth daily. 30 tablet 2  . potassium chloride (K-DUR,KLOR-CON) 10 MEQ tablet Take 2 tablets in AM and 1 tablet in PM 90 tablet 6  . ranitidine (ZANTAC) 300 MG tablet Take 0.5 tablets (150 mg total) by mouth 2 (two) times daily. 60 tablet 6  . simvastatin (ZOCOR) 10 MG tablet Take 1 tablet (10 mg total) by mouth daily. 30 tablet 6  . tamsulosin (FLOMAX) 0.4 MG CAPS capsule Take 1 -2 capsules daily     No current facility-administered medications on file prior to visit.    No Known Allergies  Past Medical History  Diagnosis Date  . Hyperlipidemia   . Degenerative disk disease   . Bell palsy Jan 2012    presented with facial numbness  . Edema   . Normal nuclear stress test 2008  . Abnormal echocardiogram Jan 2012    grade 1 diastolic dysfunction, normal EF, mild LVH  . Lumbar  stenosis   . Hypertension     dr [name]    Past Surgical History  Procedure Laterality Date  . Shoulder surgery      LEFT  . Tonsillectomy    . Knee arthroscopy      RIGHT  . Lumbar laminectomy/decompression microdiscectomy N/A 11/12/2013    Procedure: LUMBAR LAMINECTOMY/DECOMPRESSION MICRODISCECTOMY 1 LEVEL;  Surgeon: Ophelia Charter, MD;  Location: [facility];  Service: Neurosurgery;  Laterality: N/A;  L4 Laminectomy    History  Smoking status  . Former Smoker -- 1.50 packs/day for 10 years  . Types: Cigarettes  . Quit date: 05/15/1976  Smokeless tobacco  . Current User  . Types: Chew    History  Alcohol Use  . Yes    Comment: occ    Family History  Problem Relation Age of Onset  . Heart attack Father   . Coronary artery disease Father   . Hypertension Father     Review of Systems: The review of systems is per the HPI.  All other systems were reviewed and are negative.  Physical Exam: BP 126/74 mmHg  Pulse 68  Ht 5\' 9"  (1.753 m)  Wt 88.168 kg (194 lb 6 oz)  BMI 28.69 kg/m2 Patient  is very pleasant and in no acute distress. Skin is warm and dry. Color is normal.  HEENT is unremarkable. Normocephalic/atraumatic. PERRL. Sclera are nonicteric. Neck is supple. No masses. No JVD. Lungs are clear. Cardiac exam shows a regular rate and rhythm. Abdomen is soft. Extremities are without edema. Gait and ROM are intact. No gross neurologic deficits noted.   Laboratory data: Lab Results  Component Value Date   WBC 6.2 01/27/2016   HGB 14.3 01/27/2016   HCT 43.8 01/27/2016   PLT 241 01/27/2016   GLUCOSE 90 01/27/2016   CHOL 149 01/27/2016   TRIG 140 01/27/2016   HDL 38* 01/27/2016   LDLDIRECT 102.0 05/15/2011   LDLCALC 83 01/27/2016   ALT 17 01/27/2016   AST 15 01/27/2016   NA 139 01/27/2016   K 4.4 01/27/2016   CL 106 01/27/2016   CREATININE 0.85 01/27/2016   BUN 13 01/27/2016   CO2 23 01/27/2016   PSA 1.69 01/27/2016   INR 1.01 11/12/2010    HGBA1C * 11/12/2010    5.7 (NOTE)                                                                       According to the ADA Clinical Practice Recommendations for 2011, when HbA1c is used as a screening test:   >=6.5%   Diagnostic of Diabetes Mellitus           (if abnormal result  is confirmed)  5.7-6.4%   Increased risk of developing Diabetes Mellitus  References:Diagnosis and Classification of Diabetes Mellitus,Diabetes [id number] 1):S62-S69 and Standards of Medical Care in         Diabetes - 2011,Diabetes Care,2011,34  (Suppl 1):S11-S61.    Ecg today shows NSR with a normal Ecg. I have personally reviewed and interpreted this study.   Assessment / Plan: 1. HTN well controlled. Continue amlodipine, HCTZ, and lotensin. Due to ankle swelling will reduce amlodipine from 7.5 to 5 mg daily.   2. Hyperlipidemia. Labs acceptable. Continue current therapy  Follow up in 6 months.

## 2016-02-22 ENCOUNTER — Ambulatory Visit (INDEPENDENT_AMBULATORY_CARE_PROVIDER_SITE_OTHER): Payer: Medicare Other | Admitting: Family Medicine

## 2016-02-22 ENCOUNTER — Encounter: Payer: Self-pay | Admitting: Family Medicine

## 2016-02-22 VITALS — BP 124/72 | HR 92 | Temp 98.1°F | Ht 66.5 in | Wt 194.5 lb

## 2016-02-22 DIAGNOSIS — M4806 Spinal stenosis, lumbar region: Secondary | ICD-10-CM

## 2016-02-22 DIAGNOSIS — M1990 Unspecified osteoarthritis, unspecified site: Secondary | ICD-10-CM | POA: Diagnosis not present

## 2016-02-22 DIAGNOSIS — K219 Gastro-esophageal reflux disease without esophagitis: Secondary | ICD-10-CM | POA: Diagnosis not present

## 2016-02-22 DIAGNOSIS — E669 Obesity, unspecified: Secondary | ICD-10-CM

## 2016-02-22 DIAGNOSIS — M169 Osteoarthritis of hip, unspecified: Secondary | ICD-10-CM | POA: Insufficient documentation

## 2016-02-22 DIAGNOSIS — E785 Hyperlipidemia, unspecified: Secondary | ICD-10-CM

## 2016-02-22 DIAGNOSIS — N401 Enlarged prostate with lower urinary tract symptoms: Secondary | ICD-10-CM | POA: Diagnosis not present

## 2016-02-22 DIAGNOSIS — I10 Essential (primary) hypertension: Secondary | ICD-10-CM

## 2016-02-22 DIAGNOSIS — R202 Paresthesia of skin: Secondary | ICD-10-CM | POA: Insufficient documentation

## 2016-02-22 DIAGNOSIS — R3915 Urgency of urination: Secondary | ICD-10-CM

## 2016-02-22 DIAGNOSIS — M48062 Spinal stenosis, lumbar region with neurogenic claudication: Secondary | ICD-10-CM

## 2016-02-22 DIAGNOSIS — E663 Overweight: Secondary | ICD-10-CM | POA: Insufficient documentation

## 2016-02-22 NOTE — Assessment & Plan Note (Addendum)
S/p lumbar surgery.

## 2016-02-22 NOTE — Assessment & Plan Note (Signed)
Of back and knees. Takes aleve 440mg  QAM + tylenol 650mg  CR nightly. Has tried supplements in the past.

## 2016-02-22 NOTE — Assessment & Plan Note (Signed)
Appreciate uro care of patient. myrbetriq worked well but now unaffordable. They are not interested in antimuscarinics due to concern over side effects.  Continues flomax - advised check with uro about 5 alpha reductase inhibitors.

## 2016-02-22 NOTE — Assessment & Plan Note (Signed)
Endorses hand numbness and some foot burning pain. Consider checking labwork for paresthesias next visit.

## 2016-02-22 NOTE — Assessment & Plan Note (Signed)
Stable on zantac 150mg  bid.

## 2016-02-22 NOTE — Assessment & Plan Note (Addendum)
Chronic, stable. Continue current regimen. On 11mEq potassium daily despite very low dose hctz - consider checking aldosterone levels if not done previously

## 2016-02-22 NOTE — Progress Notes (Signed)
BP 124/72 mmHg  Pulse 92  Temp(Src) 98.1 F (36.7 C) (Oral)  Ht 5' 6.5" (1.689 m)  Wt 194 lb 8 oz (88.225 kg)  BMI 30.93 kg/m2   CC: new pt to establish  Subjective:    Patient ID: Adrian Davis, male    DOB: 1941-07-12, 75 y.o.   MRN: [id number]  HPI: Adrian Davis is a 75 y.o. male presenting on 02/22/2016 for [place] with wife today. Wife established with me as new patient last week. Mare Ferrari.   BPH - followed by Dr Jeffie Pollock. Takes flomax 0.'4mg'$  daily. No longer taking myrbetriq (worked well, but became too expensive). Ongoing urinary trouble since then. Does not want antimuscarinic.  GERD - stable on ranitidine '150mg'$  bid.   OA - sees ortho. Has had rooster injections.   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 Interim medical history since our last visit reviewed. Allergies and medications reviewed and updated. Current Outpatient Prescriptions on File Prior to Visit  Medication Sig  . aspirin EC 81 MG tablet Take 81 mg by mouth daily.  . benazepril (LOTENSIN) 20 MG tablet Take 1 tablet (20 mg total) by mouth 2 (two) times daily.  Marland Kitchen docusate sodium 100 MG CAPS Take 100 mg by mouth 2 (two) times daily.  . hydrochlorothiazide (HYDRODIURIL) 25 MG tablet Take 0.5 tablets (12.5 mg total) by mouth daily.  . potassium chloride (K-DUR,KLOR-CON) 10 MEQ tablet Take 2 tablets in AM and 1 tablet in PM  . ranitidine (ZANTAC) 300 MG tablet Take 0.5 tablets (150 mg total) by mouth 2 (two) times daily.  . simvastatin (ZOCOR) 10 MG tablet Take 1 tablet (10 mg total) by mouth daily.   No current facility-administered medications on file prior to visit.    Review of Systems Per HPI unless specifically indicated in ROS section     Objective:    BP 124/72 mmHg  Pulse 92  Temp(Src) 98.1 F (36.7 C) (Oral)  Ht 5'  6.5" (1.689 m)  Wt 194 lb 8 oz (88.225 kg)  BMI 30.93 kg/m2  Wt Readings from Last 3 Encounters:  02/22/16 194 lb 8 oz (88.225 kg)  01/31/16 194 lb 6 oz (88.168 kg)  06/09/15 192 lb 1.6 oz (87.136 kg)    Physical Exam  Constitutional: He appears well-developed and well-nourished. No distress.  HENT:  Head: Normocephalic and atraumatic.  Mouth/Throat: Oropharynx is clear and moist. No oropharyngeal exudate.  Eyes: Conjunctivae and EOM are normal. Pupils are equal, round, and reactive to light. No scleral icterus.  Neck: Normal range of motion. Neck supple.  Cardiovascular: Normal rate, regular rhythm, normal heart sounds and intact distal pulses.   No murmur heard. Some coupling  Pulmonary/Chest: Effort normal and breath sounds normal. No respiratory distress. He has no wheezes. He has no rales.  Musculoskeletal: He exhibits no edema.  Lymphadenopathy:    He has no cervical adenopathy.  Skin: Skin is warm and dry. No rash noted.  Psychiatric: He has a normal mood and affect.  Nursing note and vitals reviewed.  Results for orders placed or performed in visit on 96/29/52  Basic metabolic panel  Result Value Ref Range   Sodium 139 135 - 146 mmol/L   Potassium 4.4 3.5 - 5.3 mmol/L   Chloride 106 98 - 110 mmol/L   CO2 23 20 - 31  mmol/L   Glucose, Bld 90 65 - 99 mg/dL   BUN 13 7 - 25 mg/dL   Creat 0.85 0.70 - 1.18 mg/dL   Calcium 9.1 8.6 - 10.3 mg/dL  Lipid panel  Result Value Ref Range   Cholesterol 149 125 - 200 mg/dL   Triglycerides 140 <150 mg/dL   HDL 38 (L) >=40 mg/dL   Total CHOL/HDL Ratio 3.9 <=5.0 Ratio   VLDL 28 <30 mg/dL   LDL Cholesterol 83 <130 mg/dL  Hepatic function panel  Result Value Ref Range   Total Bilirubin 0.4 0.2 - 1.2 mg/dL   Bilirubin, Direct 0.1 <=0.2 mg/dL   Indirect Bilirubin 0.3 0.2 - 1.2 mg/dL   Alkaline Phosphatase 51 40 - 115 U/L   AST 15 10 - 35 U/L   ALT 17 9 - 46 U/L   Total Protein 6.2 6.1 - 8.1 g/dL   Albumin 4.1 3.6 - 5.1 g/dL  CBC  w/Diff/Platelet  Result Value Ref Range   WBC 6.2 3.8 - 10.8 K/uL   RBC 4.82 4.20 - 5.80 MIL/uL   Hemoglobin 14.3 13.2 - 17.1 g/dL   HCT 43.8 38.5 - 50.0 %   MCV 90.9 80.0 - 100.0 fL   MCH 29.7 27.0 - 33.0 pg   MCHC 32.6 32.0 - 36.0 g/dL   RDW 14.1 11.0 - 15.0 %   Platelets 241 140 - 400 K/uL   MPV 9.8 7.5 - 12.5 fL   Neutro Abs 3100 1500 - 7800 cells/uL   Lymphs Abs 2356 850 - 3900 cells/uL   Monocytes Absolute 558 200 - 950 cells/uL   Eosinophils Absolute 124 15 - 500 cells/uL   Basophils Absolute 62 0 - 200 cells/uL   Neutrophils Relative % 50 %   Lymphocytes Relative 38 %   Monocytes Relative 9 %   Eosinophils Relative 2 %   Basophils Relative 1 %   Smear Review Criteria for review not met   PSA  Result Value Ref Range   PSA 1.69 <=4.00 ng/mL      Assessment & Plan:   Problem List Items Addressed This Visit    Hyperlipidemia    Reports compliance with simvastatin 2m daily - continue.      Relevant Medications   amLODipine (NORVASC) 2.5 MG tablet   Hypertension    Chronic, stable. Continue current regimen. On 333m potassium daily despite very low dose hctz - consider checking aldosterone levels if not done previously      Relevant Medications   amLODipine (NORVASC) 2.5 MG tablet   Lumbar stenosis with neurogenic claudication    S/p lumbar surgery.       Obesity, Class I, BMI 30-34.9   GERD (gastroesophageal reflux disease)    Stable on zantac 15015mid.      Osteoarthritis    Of back and knees. Takes aleve 440m65mM + tylenol 650mg105mnightly. Has tried supplements in the past.      Relevant Medications   naproxen sodium (ANAPROX) 220 MG tablet   acetaminophen (TYLENOL) 650 MG CR tablet   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 - Primary    Appreciate uro care of patient. myrbetriq worked well but now unaffordable. They are not interested in antimuscarinics due to concern over side effects.  Continues flomax - advised check with  uro about 5 alpha reductase inhibitors.      Relevant Medications   tamsulosin (FLOMAX) 0.4 MG CAPS capsule   Paresthesias    Endorses hand numbness  and some foot burning pain. Consider checking labwork for paresthesias next visit.          Follow up plan: Return in about 3 months (around 05/24/2016), or as needed, for follow up visit.  [name], MD

## 2016-02-22 NOTE — Progress Notes (Signed)
Pre visit review using our clinic review tool, if applicable. No additional management support is needed unless otherwise documented below in the visit note. 

## 2016-02-22 NOTE — Assessment & Plan Note (Signed)
Reports compliance with simvastatin 10mg  daily - continue.

## 2016-02-22 NOTE — Patient Instructions (Addendum)
Check with urologist about finasteride (Proscar).  Bring me copy of immunization records.  Return in 7 months for wellness visit.  Good to meet you today, call us with questions.

## 2016-03-12 DIAGNOSIS — T63481A Toxic effect of venom of other arthropod, accidental (unintentional), initial encounter: Secondary | ICD-10-CM | POA: Diagnosis not present

## 2016-04-26 DIAGNOSIS — M1712 Unilateral primary osteoarthritis, left knee: Secondary | ICD-10-CM | POA: Diagnosis not present

## 2016-06-20 ENCOUNTER — Other Ambulatory Visit: Payer: Self-pay

## 2016-07-02 ENCOUNTER — Other Ambulatory Visit: Payer: Self-pay | Admitting: Cardiology

## 2016-07-03 NOTE — Telephone Encounter (Signed)
REFILL 

## 2016-07-10 DIAGNOSIS — R351 Nocturia: Secondary | ICD-10-CM | POA: Diagnosis not present

## 2016-07-10 DIAGNOSIS — N5201 Erectile dysfunction due to arterial insufficiency: Secondary | ICD-10-CM | POA: Diagnosis not present

## 2016-07-10 DIAGNOSIS — N401 Enlarged prostate with lower urinary tract symptoms: Secondary | ICD-10-CM | POA: Diagnosis not present

## 2016-07-25 ENCOUNTER — Encounter: Payer: Self-pay | Admitting: Family Medicine

## 2016-07-25 DIAGNOSIS — R490 Dysphonia: Secondary | ICD-10-CM | POA: Diagnosis not present

## 2016-07-25 DIAGNOSIS — J383 Other diseases of vocal cords: Secondary | ICD-10-CM | POA: Diagnosis not present

## 2016-07-25 DIAGNOSIS — F17220 Nicotine dependence, chewing tobacco, uncomplicated: Secondary | ICD-10-CM | POA: Diagnosis not present

## 2016-07-25 DIAGNOSIS — H938X3 Other specified disorders of ear, bilateral: Secondary | ICD-10-CM | POA: Diagnosis not present

## 2016-07-25 DIAGNOSIS — H6123 Impacted cerumen, bilateral: Secondary | ICD-10-CM | POA: Insufficient documentation

## 2016-08-03 DIAGNOSIS — M1712 Unilateral primary osteoarthritis, left knee: Secondary | ICD-10-CM | POA: Diagnosis not present

## 2016-08-13 ENCOUNTER — Other Ambulatory Visit: Payer: Self-pay | Admitting: Cardiology

## 2016-09-01 DIAGNOSIS — Z23 Encounter for immunization: Secondary | ICD-10-CM | POA: Diagnosis not present

## 2016-09-07 ENCOUNTER — Telehealth: Payer: Self-pay | Admitting: Family Medicine

## 2016-09-07 NOTE — Telephone Encounter (Signed)
I spoke with pt wife about r/s from 12/13 medicare cpe. She said they are leaving to go out of the country on 12/24 and she wanted it sch before that b/c he needs to speak with Dr. Darnell Level about an antibiotic for travel, as previous pcp did.. I sch him for Wed 12/20 but several cpe previously sch.

## 2016-09-27 ENCOUNTER — Other Ambulatory Visit: Payer: Medicare Other

## 2016-10-04 ENCOUNTER — Encounter: Payer: Medicare Other | Admitting: Family Medicine

## 2016-10-11 ENCOUNTER — Encounter: Payer: Medicare Other | Admitting: Family Medicine

## 2016-10-30 ENCOUNTER — Other Ambulatory Visit: Payer: Self-pay | Admitting: Cardiology

## 2016-11-10 ENCOUNTER — Ambulatory Visit: Payer: Medicare Other | Admitting: Family Medicine

## 2016-11-27 ENCOUNTER — Other Ambulatory Visit: Payer: Self-pay | Admitting: Cardiology

## 2016-12-15 ENCOUNTER — Other Ambulatory Visit: Payer: Self-pay

## 2016-12-15 DIAGNOSIS — N401 Enlarged prostate with lower urinary tract symptoms: Secondary | ICD-10-CM

## 2016-12-15 DIAGNOSIS — I10 Essential (primary) hypertension: Secondary | ICD-10-CM

## 2016-12-15 DIAGNOSIS — R3915 Urgency of urination: Secondary | ICD-10-CM

## 2016-12-15 DIAGNOSIS — E785 Hyperlipidemia, unspecified: Secondary | ICD-10-CM

## 2016-12-21 DIAGNOSIS — Z85828 Personal history of other malignant neoplasm of skin: Secondary | ICD-10-CM | POA: Diagnosis not present

## 2016-12-21 DIAGNOSIS — D485 Neoplasm of uncertain behavior of skin: Secondary | ICD-10-CM | POA: Diagnosis not present

## 2016-12-25 DIAGNOSIS — L989 Disorder of the skin and subcutaneous tissue, unspecified: Secondary | ICD-10-CM | POA: Insufficient documentation

## 2016-12-26 ENCOUNTER — Other Ambulatory Visit (INDEPENDENT_AMBULATORY_CARE_PROVIDER_SITE_OTHER): Payer: Medicare Other

## 2016-12-26 DIAGNOSIS — I10 Essential (primary) hypertension: Secondary | ICD-10-CM

## 2016-12-26 DIAGNOSIS — E785 Hyperlipidemia, unspecified: Secondary | ICD-10-CM | POA: Diagnosis not present

## 2016-12-26 LAB — LIPID PANEL
Cholesterol: 145 mg/dL (ref 0–200)
HDL: 37.1 mg/dL — ABNORMAL LOW (ref >39.00)
LDL Cholesterol: 88 mg/dL (ref 0–99)
NonHDL: 107.86
Total CHOL/HDL Ratio: 4
Triglycerides: 101 mg/dL (ref 0.0–149.0)
VLDL: 20.2 mg/dL (ref 0.0–40.0)

## 2016-12-26 LAB — BASIC METABOLIC PANEL
BUN: 23 mg/dL (ref 6–23)
CO2: 29 mEq/L (ref 19–32)
Calcium: 9.4 mg/dL (ref 8.4–10.5)
Chloride: 106 mEq/L (ref 96–112)
Creatinine, Ser: 0.98 mg/dL (ref 0.40–1.50)
GFR: 79.34 mL/min (ref >60.00)
Glucose, Bld: 99 mg/dL (ref 70–99)
Potassium: 4.1 mEq/L (ref 3.5–5.1)
Sodium: 140 mEq/L (ref 135–145)

## 2016-12-26 LAB — HEPATIC FUNCTION PANEL
ALT: 14 U/L (ref 0–53)
AST: 13 U/L (ref 0–37)
Albumin: 4.3 g/dL (ref 3.5–5.2)
Alkaline Phosphatase: 53 U/L (ref 39–117)
Bilirubin, Direct: 0.1 mg/dL (ref 0.0–0.3)
Total Bilirubin: 0.4 mg/dL (ref 0.2–1.2)
Total Protein: 6.5 g/dL (ref 6.0–8.3)

## 2016-12-27 NOTE — Progress Notes (Signed)
Adrian Davis Date of Birth: 14-Mar-1941 Medical Record #[id number]  History of Present Illness: Adrian Davis is seen today for follow up of HTN. He has a history of HTN and HLD. No known CAD. He had a normal Adenosine Myoview back in 2008. His last echo was in January of 2012 showing grade 1 diastolic dysfunction, mild LVH and a normal EF.  He denies any chest pain or SOB.   No complaints today. He does note some swelling in his legs at the end of the day L>R. Notes arthritic pain in left knee and is getting an injection for this today. No chest pain or dyspnea.   Current Outpatient Prescriptions on File Prior to Visit  Medication Sig Dispense Refill  . acetaminophen (TYLENOL) 650 MG CR tablet Take 650 mg by mouth every 8 (eight) hours as needed for pain.    Marland Kitchen amLODipine (NORVASC) 2.5 MG tablet TAKE 2 TABLETS BY MOUTH EVERY MORNING AND 1 TABLET IN THE AFTERNOON AS DIRECTED 270 tablet 3  . aspirin EC 81 MG tablet Take 81 mg by mouth daily.    . benazepril (LOTENSIN) 20 MG tablet Take 1 tablet (20 mg total) by mouth 2 (two) times daily. 60 tablet 11  . diclofenac sodium (VOLTAREN) 1 % GEL Apply 1 application topically 3 (three) times daily.    . DiphenhydrAMINE HCl ([facility] ALLERGY PO) Take 1 tablet by mouth at bedtime.    . docusate sodium 100 MG CAPS Take 100 mg by mouth 2 (two) times daily. 60 capsule 0  . hydrochlorothiazide (HYDRODIURIL) 25 MG tablet Take 0.5 tablets (12.5 mg total) by mouth daily. 30 tablet 2  . naproxen sodium (ANAPROX) 220 MG tablet Take 440 mg by mouth daily.     . potassium chloride (K-DUR) 10 MEQ tablet TAKE 2 TABLETS BY MOUTH EVERY MORNING AND 1 TABLET EVERY EVENING AS DIRECTED 90 tablet 11  . potassium chloride (K-DUR,KLOR-CON) 10 MEQ tablet Take 2 tablets in AM and 1 tablet in PM 90 tablet 6  . ranitidine (ZANTAC) 300 MG tablet Take 0.5 tablets (150 mg total) by mouth 2 (two) times daily. 60 tablet 6  . simvastatin (ZOCOR) 10 MG tablet take 1 tablet by mouth once daily 30  tablet 6  . tamsulosin (FLOMAX) 0.4 MG CAPS capsule Take 1 capsule (0.4 mg total) by mouth daily after supper.     No current facility-administered medications on file prior to visit.     No Known Allergies  Past Medical History:  Diagnosis Date  . Abnormal echocardiogram Jan 2012   grade 1 diastolic dysfunction, normal EF, mild LVH  . Bell palsy 10/2010   presented with facial numbness  . 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 Degenerative disk disease   . GERD (gastroesophageal reflux disease)   . Hyperlipidemia   . Hypertension    dr [place] Nellums [place]  . Lumbar stenosis   . Nephrolithiasis   . Normal nuclear stress test 2008  . Osteoarthritis   . Skin cancer   . Vocal cord leukoplakia    Dr Erik Obey    Past Surgical History:  Procedure Laterality Date  . KNEE ARTHROSCOPY Right   . LUMBAR LAMINECTOMY/DECOMPRESSION MICRODISCECTOMY N/A 11/12/2013   Ophelia Charter, MD - complicated by urinary retention  . SHOULDER SURGERY Bilateral   . TONSILLECTOMY      History  Smoking Status  . Former Smoker  . Packs/day: 1.50  . Years: 10.00  . Types: Cigarettes  .  Quit date: 05/15/1976  Smokeless Tobacco  . Current User  . Types: Chew    History  Alcohol Use  . Yes    Comment: occ    Family History  Problem Relation Age of Onset  . Heart failure Father 30  . Hypertension Father   . Arthritis Father   . Cancer Neg Hx   . Diabetes Neg Hx     Review of Systems: The review of systems is per the HPI.  All other systems were reviewed and are negative.  Physical Exam: BP 130/78   Pulse 75   Ht 5\' 6"  (1.676 m)   Wt 193 lb (87.5 kg)   BMI 31.15 kg/m  Patient is very pleasant and in no acute distress. Skin is warm and dry. Color is normal.  HEENT is unremarkable. Normocephalic/atraumatic. PERRL. Sclera are nonicteric. Neck is supple. No masses. No JVD. Lungs are clear. Cardiac exam shows a regular rate and rhythm. Abdomen is soft. Extremities show  trace right ankle edema. Gait and ROM are intact. No gross neurologic deficits noted.   Laboratory data: Lab Results  Component Value Date   WBC 6.2 01/27/2016   HGB 14.3 01/27/2016   HCT 43.8 01/27/2016   PLT 241 01/27/2016   GLUCOSE 99 12/26/2016   CHOL 145 12/26/2016   TRIG 101.0 12/26/2016   HDL 37.10 (L) 12/26/2016   LDLDIRECT 102.0 05/15/2011   LDLCALC 88 12/26/2016   ALT 14 12/26/2016   AST 13 12/26/2016   NA 140 12/26/2016   K 4.1 12/26/2016   CL 106 12/26/2016   CREATININE 0.98 12/26/2016   BUN 23 12/26/2016   CO2 29 12/26/2016   PSA 1.69 01/27/2016   INR 1.01 11/12/2010   HGBA1C (H) 11/12/2010    5.7 (NOTE)                                                                       According to the ADA Clinical Practice Recommendations for 2011, when HbA1c is used as a screening test:   >=6.5%   Diagnostic of Diabetes Mellitus           (if abnormal result  is confirmed)  5.7-6.4%   Increased risk of developing Diabetes Mellitus  References:Diagnosis and Classification of Diabetes Mellitus,Diabetes SJGG,8366,29(UTMLY 1):S62-S69 and Standards of Medical Care in         Diabetes - 2011,Diabetes Care,2011,34  (Suppl 1):S11-S61.    Ecg today shows NSR . PVCs. Nonspecific TWA. I have personally reviewed and interpreted this study.   Assessment / Plan: 1. HTN well controlled. Continue amlodipine, HCTZ, and lotensin. Ankle swelling improved with reduction in amlodipine.   2. Hyperlipidemia. Labs look good. Continue current therapy  Follow up in 6 months.

## 2016-12-29 ENCOUNTER — Encounter: Payer: Self-pay | Admitting: Cardiology

## 2016-12-29 ENCOUNTER — Ambulatory Visit (INDEPENDENT_AMBULATORY_CARE_PROVIDER_SITE_OTHER): Payer: Medicare Other | Admitting: Cardiology

## 2016-12-29 VITALS — BP 130/78 | HR 75 | Ht 66.0 in | Wt 193.0 lb

## 2016-12-29 DIAGNOSIS — I10 Essential (primary) hypertension: Secondary | ICD-10-CM | POA: Diagnosis not present

## 2016-12-29 DIAGNOSIS — E785 Hyperlipidemia, unspecified: Secondary | ICD-10-CM | POA: Diagnosis not present

## 2016-12-29 DIAGNOSIS — M1712 Unilateral primary osteoarthritis, left knee: Secondary | ICD-10-CM | POA: Diagnosis not present

## 2016-12-29 NOTE — Patient Instructions (Signed)
Continue your current therapy  I will see you in 6 months.   

## 2017-01-02 DIAGNOSIS — L819 Disorder of pigmentation, unspecified: Secondary | ICD-10-CM | POA: Diagnosis not present

## 2017-01-02 DIAGNOSIS — L570 Actinic keratosis: Secondary | ICD-10-CM | POA: Diagnosis not present

## 2017-01-03 ENCOUNTER — Ambulatory Visit (INDEPENDENT_AMBULATORY_CARE_PROVIDER_SITE_OTHER): Payer: Medicare Other | Admitting: Family Medicine

## 2017-01-03 ENCOUNTER — Encounter: Payer: Self-pay | Admitting: Family Medicine

## 2017-01-03 VITALS — BP 130/72 | HR 76 | Temp 97.5°F | Ht 67.0 in | Wt 193.8 lb

## 2017-01-03 DIAGNOSIS — Z0000 Encounter for general adult medical examination without abnormal findings: Secondary | ICD-10-CM | POA: Diagnosis not present

## 2017-01-03 DIAGNOSIS — R3915 Urgency of urination: Secondary | ICD-10-CM | POA: Diagnosis not present

## 2017-01-03 DIAGNOSIS — M1990 Unspecified osteoarthritis, unspecified site: Secondary | ICD-10-CM

## 2017-01-03 DIAGNOSIS — K219 Gastro-esophageal reflux disease without esophagitis: Secondary | ICD-10-CM

## 2017-01-03 DIAGNOSIS — Z7184 Encounter for health counseling related to travel: Secondary | ICD-10-CM | POA: Insufficient documentation

## 2017-01-03 DIAGNOSIS — N401 Enlarged prostate with lower urinary tract symptoms: Secondary | ICD-10-CM | POA: Diagnosis not present

## 2017-01-03 DIAGNOSIS — E785 Hyperlipidemia, unspecified: Secondary | ICD-10-CM | POA: Diagnosis not present

## 2017-01-03 DIAGNOSIS — I10 Essential (primary) hypertension: Secondary | ICD-10-CM

## 2017-01-03 DIAGNOSIS — Z7189 Other specified counseling: Secondary | ICD-10-CM | POA: Insufficient documentation

## 2017-01-03 DIAGNOSIS — Z23 Encounter for immunization: Secondary | ICD-10-CM | POA: Diagnosis not present

## 2017-01-03 MED ORDER — PNEUMOCOCCAL 13-VAL CONJ VACC IM SUSP: 0.5000 mL | Freq: Once | INTRAMUSCULAR | Status: DC

## 2017-01-03 MED ORDER — RANITIDINE HCL 300 MG PO TABS: 300.0000 mg | ORAL_TABLET | Freq: Two times a day (BID) | ORAL | 6 refills | Status: DC

## 2017-01-03 MED ORDER — CIPROFLOXACIN HCL 500 MG PO TABS: 500.0000 mg | ORAL_TABLET | Freq: Two times a day (BID) | ORAL | 0 refills | Status: DC

## 2017-01-03 MED ORDER — TYPHOID VACCINE PO CPDR: 1.0000 | DELAYED_RELEASE_CAPSULE | ORAL | 0 refills | Status: DC

## 2017-01-03 NOTE — Assessment & Plan Note (Signed)

## 2017-01-03 NOTE — Patient Instructions (Addendum)
Prevnar pneumonia shot today.  Sign release for last office visit from urology when you leave today and I can follow here if you'd like.  We will sign you up for cologuard.  Consider typhoid vaccine - pills sent to pharmacy.  Bring me copy of your living will to update your chart.  May take cipro as needed for traveller's diarrhea Return as needed or in 1 year for next physical.   Health Maintenance, Male A healthy lifestyle and preventive care is important for your health and wellness. Ask your health care provider about what schedule of regular examinations is right for you. What should I know about weight and diet?  Eat a Healthy Diet  Eat plenty of vegetables, fruits, whole grains, low-fat dairy products, and lean protein.  Do not eat a lot of foods high in solid fats, added sugars, or salt. Maintain a Healthy Weight  Regular exercise can help you achieve or maintain a healthy weight. You should:  Do at least 150 minutes of exercise each week. The exercise should increase your heart rate and make you sweat (moderate-intensity exercise).  Do strength-training exercises at least twice a week. Watch Your Levels of Cholesterol and Blood Lipids  Have your blood tested for lipids and cholesterol every 5 years starting at 75 years of age. If you are at high risk for heart disease, you should start having your blood tested when you are 75 years old. You may need to have your cholesterol levels checked more often if:  Your lipid or cholesterol levels are high.  You are older than 75 years of age.  You are at high risk for heart disease. What should I know about cancer screening? Many types of cancers can be detected [place] and may often be prevented. Lung Cancer  You should be screened every year for lung cancer if:  You are a current smoker who has smoked for at least 30 years.  You are a former smoker who has quit within the past 15 years.  Talk to your health care provider about  your screening options, when you should start screening, and how often you should be screened. Colorectal Cancer  Routine colorectal cancer screening usually begins at 75 years of age and should be repeated every 5-10 years until you are 75 years old. You may need to be screened more often if [place] forms of precancerous polyps or small growths are found. Your health care provider may recommend screening at an earlier age if you have risk factors for colon cancer.  Your health care provider may recommend using home test kits to check for hidden blood in the stool.  A small camera at the end of a tube can be used to examine your colon (sigmoidoscopy or colonoscopy). This checks for the earliest forms of colorectal cancer. Prostate and Testicular Cancer  Depending on your age and overall health, your health care provider may do certain tests to screen for prostate and testicular cancer.  Talk to your health care provider about any symptoms or concerns you have about testicular or prostate cancer. Skin Cancer  Check your skin from head to toe regularly.  Tell your health care provider about any new moles or changes in moles, especially if:  There is a change in a mole's size, shape, or color.  You have a mole that is larger than a pencil eraser.  Always use sunscreen. Apply sunscreen liberally and repeat throughout the day.  Protect yourself by wearing long sleeves, pants,  a wide-brimmed hat, and sunglasses when outside. What should I know about heart disease, diabetes, and high blood pressure?  If you are 38-17 years of age, have your blood pressure checked every 3-5 years. If you are 10 years of age or older, have your blood pressure checked every year. You should have your blood pressure measured twice-once when you are at a hospital or clinic, and once when you are not at a hospital or clinic. Record the average of the two measurements. To check your blood pressure when you are not at a  hospital or clinic, you can use:  An automated blood pressure machine at a pharmacy.  A home blood pressure monitor.  Talk to your health care provider about your target blood pressure.  If you are between 91-53 years old, ask your health care provider if you should take aspirin to prevent heart disease.  Have regular diabetes screenings by checking your fasting blood sugar level.  If you are at a normal weight and have a low risk for diabetes, have this test once every three years after the age of 52.  If you are overweight and have a high risk for diabetes, consider being tested at a younger age or more often.  A one-time screening for abdominal aortic aneurysm (AAA) by ultrasound is recommended for men aged 62-75 years who are current or former smokers. What should I know about preventing infection? Hepatitis B  If you have a higher risk for hepatitis B, you should be screened for this virus. Talk with your health care provider to find out if you are at risk for hepatitis B infection. Hepatitis C  Blood testing is recommended for:  Everyone born from [birth year] through 1965.  Anyone with known risk factors for hepatitis C. Sexually Transmitted Diseases (STDs)  You should be screened each year for STDs including gonorrhea and chlamydia if:  You are sexually active and are younger than 75 years of age.  You are older than 75 years of age and your health care provider tells you that you are at risk for this type of infection.  Your sexual activity has changed since you were last screened and you are at an increased risk for chlamydia or gonorrhea. Ask your health care provider if you are at risk.  Talk with your health care provider about whether you are at high risk of being infected with HIV. Your health care provider may recommend a prescription medicine to help prevent HIV infection. What else can I do?  Schedule regular health, dental, and eye exams.  Stay current with your  vaccines (immunizations).  Do not use any tobacco products, such as cigarettes, chewing tobacco, and e-cigarettes. If you need help quitting, ask your health care provider.  Limit alcohol intake to no more than 2 drinks per day. One drink equals 12 ounces of beer, 5 ounces of wine, or 1 ounces of hard liquor.  Do not use street drugs.  Do not share needles.  Ask your health care provider for help if you need support or information about quitting drugs.  Tell your health care provider if you often feel depressed.  Tell your health care provider if you have ever been abused or do not feel safe at home. This information is not intended to replace advice given to you by your health care provider. Make sure you discuss any questions you have with your health care provider. Document Released: 04/06/2008 Document Revised: 06/07/2016 Document Reviewed: 07/13/2015 Elsevier Interactive Patient  Education  2017 Elsevier Inc.  

## 2017-01-03 NOTE — Assessment & Plan Note (Addendum)
Followed by urology. myrbetriq was helpful but unaffordable. Worried about antimuscarinics due to side effects. Requests PSA at next labs, requests we follow PSA/DRE here.  Some endorsed incontinence/frequency/incomplete bladder emptying I requested latest urology note (last note we have is from 2015).

## 2017-01-03 NOTE — Assessment & Plan Note (Signed)
Advanced directive discussion - has set up living will - Adrian Davis is wife. Will bring me copy.

## 2017-01-03 NOTE — Assessment & Plan Note (Signed)
Chronic, stable. Continue statin.  

## 2017-01-03 NOTE — Assessment & Plan Note (Signed)
Discussed zantac use. Refilled per pt request.

## 2017-01-03 NOTE — Assessment & Plan Note (Signed)
S/p recent knee steroid injection - followed by orthopedics

## 2017-01-03 NOTE — Assessment & Plan Note (Signed)
Reviewed CDC recommendations for travel to [place]. Declines Hep B vaccination. prevnar today. Provided with vivotif PO vaccine with indications on when to treat. Provided with cipro Rx for traveler's diarrhea.

## 2017-01-03 NOTE — Assessment & Plan Note (Signed)
Chronic, stable. Continue current regimen. 

## 2017-01-03 NOTE — Progress Notes (Addendum)
BP 130/72   Pulse 76   Temp 97.5 F (36.4 C)   Ht 5\' 7"  (1.702 m)   Wt 193 lb 12.8 oz (87.9 kg)   SpO2 98%   BMI 30.35 kg/m    CC: medicare wellness visit Subjective:    Patient ID: Adrian Davis, male    DOB: 1941/09/08, 76 y.o.   MRN: [id number]  HPI: Adrian Davis is a 76 y.o. male presenting on 01/03/2017 for Annual Exam   GERD - treated with zantac 300mg  1/2 tab BID with rare protonix for diet related breakthrough sxs.   Recent knee steroid injection.  Planning trip to [place] 02/2017 - 2 wks. Asks about vaccination recommendations   Hearing screen - passed Vision screen - passed Fall risk screen - passed Depression screen - passed  Preventative: Colon cancer screening - remote colonoscopy. Requests cologuard.  Prostate cancer screening - sees urology Dr Jeffie Pollock yearly - would like to follow here. myrbetriq previously very helpful for incontinence.  Lung cancer screening - not eligible - quit 1977 Flu shot - 08/2016 Tda 2012 Pneumovax 2012, prevnar today Shingles shot - ~2013.  Advanced directive discussion - has set up living will - [name] is wife. Will bring me copy.  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 Interim medical history since our last visit reviewed. Allergies and medications reviewed and updated. Outpatient Medications Prior to Visit  Medication Sig Dispense Refill  . acetaminophen (TYLENOL) 650 MG CR tablet Take 650 mg by mouth every 8 (eight) hours as needed for pain.    Marland Kitchen amLODipine (NORVASC) 2.5 MG tablet TAKE 2 TABLETS BY MOUTH EVERY MORNING AND 1 TABLET IN THE AFTERNOON AS DIRECTED 270 tablet 3  . aspirin EC 81 MG tablet Take 81 mg by mouth daily.    .  benazepril (LOTENSIN) 20 MG tablet Take 1 tablet (20 mg total) by mouth 2 (two) times daily. 60 tablet 11  . diclofenac sodium (VOLTAREN) 1 % GEL Apply 1 application topically 3 (three) times daily.    . DiphenhydrAMINE HCl ([facility] ALLERGY PO) Take 1 tablet by mouth at bedtime.    . docusate sodium 100 MG CAPS Take 100 mg by mouth 2 (two) times daily. 60 capsule 0  . hydrochlorothiazide (HYDRODIURIL) 25 MG tablet Take 0.5 tablets (12.5 mg total) by mouth daily. 30 tablet 2  . naproxen sodium (ANAPROX) 220 MG tablet Take 440 mg by mouth daily.     . potassium chloride (K-DUR) 10 MEQ tablet TAKE 2 TABLETS BY MOUTH EVERY MORNING AND 1 TABLET EVERY EVENING AS DIRECTED 90 tablet 11  . potassium chloride (K-DUR,KLOR-CON) 10 MEQ tablet Take 2 tablets in AM and 1 tablet in PM 90 tablet 6  . simvastatin (ZOCOR) 10 MG tablet take 1 tablet by mouth once daily 30 tablet 6  . tamsulosin (FLOMAX) 0.4 MG CAPS capsule Take 1 capsule (0.4 mg total) by mouth daily after supper.    . ranitidine (ZANTAC) 300 MG tablet Take 0.5 tablets (150 mg total) by mouth 2 (two) times daily. 60 tablet 6   No facility-administered medications prior to visit.      Per HPI unless specifically indicated in ROS section below Review  of Systems     Objective:    BP 130/72   Pulse 76   Temp 97.5 F (36.4 C)   Ht 5\' 7"  (1.702 m)   Wt 193 lb 12.8 oz (87.9 kg)   SpO2 98%   BMI 30.35 kg/m   Wt Readings from Last 3 Encounters:  01/03/17 193 lb 12.8 oz (87.9 kg)  12/29/16 193 lb (87.5 kg)  02/22/16 194 lb 8 oz (88.2 kg)    Physical Exam  Constitutional: He is oriented to person, place, and time. He appears well-developed and well-nourished. No distress.  HENT:  Head: Normocephalic and atraumatic.  Right Ear: Hearing, tympanic membrane, external ear and ear canal normal.  Left Ear: Hearing, tympanic membrane, external ear and ear canal normal.  Nose: Nose normal.  Mouth/Throat: Uvula is midline, oropharynx is clear and moist  and mucous membranes are normal. No oropharyngeal exudate, posterior oropharyngeal edema or posterior oropharyngeal erythema.  Eyes: Conjunctivae and EOM are normal. Pupils are equal, round, and reactive to light. No scleral icterus.  Neck: Normal range of motion. Neck supple. Carotid bruit is not present. No thyromegaly present.  Cardiovascular: Normal rate, regular rhythm, normal heart sounds and intact distal pulses.   No murmur heard. Pulses:      Radial pulses are 2+ on the right side, and 2+ on the left side.  Pulmonary/Chest: Effort normal and breath sounds normal. No respiratory distress. He has no wheezes. He has no rales.  Abdominal: Soft. Bowel sounds are normal. He exhibits no distension and no mass. There is no tenderness. There is no rebound and no guarding.  Musculoskeletal: Normal range of motion. He exhibits no edema.  Lymphadenopathy:    He has no cervical adenopathy.  Neurological: He is alert and oriented to person, place, and time.  CN grossly intact, station and gait intact Recall 2/3, 3/3 with cue Calculation 4/5 serial 3s  Skin: Skin is warm and dry. No rash noted.  Psychiatric: He has a normal mood and affect. His behavior is normal. Judgment and thought content normal.  Nursing note and vitals reviewed.  Results for orders placed or performed in visit on 27/78/24  Basic metabolic panel  Result Value Ref Range   Sodium 140 135 - 145 mEq/L   Potassium 4.1 3.5 - 5.1 mEq/L   Chloride 106 96 - 112 mEq/L   CO2 29 19 - 32 mEq/L   Glucose, Bld 99 70 - 99 mg/dL   BUN 23 6 - 23 mg/dL   Creatinine, Ser 0.98 0.40 - 1.50 mg/dL   Calcium 9.4 8.4 - 10.5 mg/dL   GFR 79.34 >60.00 mL/min  Lipid panel  Result Value Ref Range   Cholesterol 145 0 - 200 mg/dL   Triglycerides 101.0 0.0 - 149.0 mg/dL   HDL 37.10 (L) >39.00 mg/dL   VLDL 20.2 0.0 - 40.0 mg/dL   LDL Cholesterol 88 0 - 99 mg/dL   Total CHOL/HDL Ratio 4    NonHDL 107.86   Hepatic function panel  Result Value  Ref Range   Total Bilirubin 0.4 0.2 - 1.2 mg/dL   Bilirubin, Direct 0.1 0.0 - 0.3 mg/dL   Alkaline Phosphatase 53 39 - 117 U/L   AST 13 0 - 37 U/L   ALT 14 0 - 53 U/L   Total Protein 6.5 6.0 - 8.3 g/dL   Albumin 4.3 3.5 - 5.2 g/dL      Assessment & Plan:   Problem List Items Addressed This Visit  Advanced care planning/counseling discussion    Advanced directive discussion - has set up living will - [name] is wife. Will bring me copy.       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    Followed by urology. myrbetriq was helpful but unaffordable. Worried about antimuscarinics due to side effects. Requests PSA at next labs, requests we follow PSA/DRE here.  Some endorsed incontinence/frequency/incomplete bladder emptying I requested latest urology note (last note we have is from 2015).      Counseling about travel    Reviewed CDC recommendations for travel to [place]. Declines Hep B vaccination. prevnar today. Provided with vivotif PO vaccine with indications on when to treat. Provided with cipro Rx for traveler's diarrhea.      Essential hypertension    Chronic, stable. Continue current regimen.       GERD (gastroesophageal reflux disease)    Discussed zantac use. Refilled per pt request.      Relevant Medications   ranitidine (ZANTAC) 300 MG tablet   Hyperlipidemia    Chronic, stable. Continue statin.       Medicare annual wellness visit, initial - Primary    I have personally reviewed the Medicare Annual Wellness questionnaire and have noted 1. The patient's medical and social history 2. Their use of alcohol, tobacco or illicit drugs 3. Their current medications and supplements 4. The patient's functional ability including ADL's, fall risks, home safety risks and hearing or visual impairment. Cognitive function has been assessed and addressed as indicated.  5. Diet and physical activity 6. Evidence for depression or mood disorders The patients  weight, height, BMI have been recorded in the chart. I have made referrals, counseling and provided education to the patient based on review of the above and I have provided the pt with a written personalized care plan for preventive services. Provider list updated.. See scanned questionairre as needed for further documentation. Reviewed preventative protocols and updated unless pt declined.       Osteoarthritis    S/p recent knee steroid injection - followed by orthopedics       Other Visit Diagnoses    Need for pneumococcal vaccination       Relevant Orders   Pneumococcal conjugate vaccine 13-valent (Completed)       Follow up plan: Return in about 1 year (around 01/03/2018) for medicare wellness visit.  [name], MD

## 2017-01-16 DIAGNOSIS — L570 Actinic keratosis: Secondary | ICD-10-CM | POA: Insufficient documentation

## 2017-03-05 DIAGNOSIS — Z85828 Personal history of other malignant neoplasm of skin: Secondary | ICD-10-CM | POA: Diagnosis not present

## 2017-03-05 DIAGNOSIS — L578 Other skin changes due to chronic exposure to nonionizing radiation: Secondary | ICD-10-CM | POA: Diagnosis not present

## 2017-03-05 DIAGNOSIS — L812 Freckles: Secondary | ICD-10-CM | POA: Diagnosis not present

## 2017-03-05 DIAGNOSIS — L821 Other seborrheic keratosis: Secondary | ICD-10-CM | POA: Diagnosis not present

## 2017-03-05 DIAGNOSIS — I8310 Varicose veins of unspecified lower extremity with inflammation: Secondary | ICD-10-CM | POA: Diagnosis not present

## 2017-03-05 DIAGNOSIS — L570 Actinic keratosis: Secondary | ICD-10-CM | POA: Diagnosis not present

## 2017-03-05 DIAGNOSIS — D1800 Hemangioma unspecified site: Secondary | ICD-10-CM | POA: Diagnosis not present

## 2017-03-05 DIAGNOSIS — Z1283 Encounter for screening for malignant neoplasm of skin: Secondary | ICD-10-CM | POA: Diagnosis not present

## 2017-03-05 DIAGNOSIS — D229 Melanocytic nevi, unspecified: Secondary | ICD-10-CM | POA: Diagnosis not present

## 2017-04-13 ENCOUNTER — Telehealth: Payer: Self-pay | Admitting: Family Medicine

## 2017-04-13 ENCOUNTER — Ambulatory Visit (INDEPENDENT_AMBULATORY_CARE_PROVIDER_SITE_OTHER): Payer: Medicare Other | Admitting: Family Medicine

## 2017-04-13 ENCOUNTER — Encounter: Payer: Self-pay | Admitting: Family Medicine

## 2017-04-13 VITALS — BP 128/78 | HR 70 | Temp 98.1°F | Ht 67.0 in | Wt 192.0 lb

## 2017-04-13 DIAGNOSIS — J019 Acute sinusitis, unspecified: Secondary | ICD-10-CM | POA: Insufficient documentation

## 2017-04-13 MED ORDER — AMOXICILLIN-POT CLAVULANATE 875-125 MG PO TABS: 1.0000 | ORAL_TABLET | Freq: Two times a day (BID) | ORAL | 0 refills | Status: DC

## 2017-04-13 NOTE — Assessment & Plan Note (Signed)
Anticipate bacterial given duration. Treat with augmentin 7d course.  Push fluids and rest  Update if not improving with treatment.

## 2017-04-13 NOTE — Telephone Encounter (Signed)
Received call from [facility]. Patient was seen earlier today by his PCP for sinusitis. Note was reviewed. Patient currently at the pharmacy and medication was not received. It appears that they wanted to treat with Augmentin. It does not appear this was sent to the pharmacy. I will send this to the pharmacy based on the note. Creatinine clearance was reviewed and he can tolerate the dosage prescribed. FYI to PCP.

## 2017-04-13 NOTE — Patient Instructions (Addendum)
You have sinusitis. Treat with augmentin antibiotic for 7 days. Push fluids and rest Salt water gargles, nasal saline irrigation.  Watch for fever >101, worsening productive cough, or not improving with treatment - let us know if this happens.

## 2017-04-13 NOTE — Progress Notes (Signed)
BP 128/78   Pulse 70   Temp 98.1 F (36.7 C)   Ht 5\' 7"  (1.702 m)   Wt 192 lb (87.1 kg)   SpO2 97%   BMI 30.07 kg/m    CC: cough Subjective:    Patient ID: Adrian Davis, male    DOB: 1941-05-23, 76 y.o.   MRN: [id number]  HPI: JALEAL SCHLIEP is a 76 y.o. male presenting on 04/13/2017 for Cough   Recent trip to [place].  3 wk h/o head congestion which seems to be improving. Initial cough that has largely subsided. Blowing a lot of mucous. Sinus headache. Ongoing dyspnea and fatigue.   He did take zpack at onset of illness without improvement.   No fevers/chills, ear or tooth pain.   Works on farm regularly.  Treating with tylenol, decongestant, claritin, robitussin.  Wife sick as well recently.  No h/o asthma. Non smoker  Relevant past medical, surgical, family and social history reviewed and updated as indicated. Interim medical history since our last visit reviewed. Allergies and medications reviewed and updated. Outpatient Medications Prior to Visit  Medication Sig Dispense Refill  . acetaminophen (TYLENOL) 650 MG CR tablet Take 650 mg by mouth every 8 (eight) hours as needed for pain.    Marland Kitchen amLODipine (NORVASC) 2.5 MG tablet TAKE 2 TABLETS BY MOUTH EVERY MORNING AND 1 TABLET IN THE AFTERNOON AS DIRECTED 270 tablet 3  . aspirin EC 81 MG tablet Take 81 mg by mouth daily.    . benazepril (LOTENSIN) 20 MG tablet Take 1 tablet (20 mg total) by mouth 2 (two) times daily. 60 tablet 11  . diclofenac sodium (VOLTAREN) 1 % GEL Apply 1 application topically 3 (three) times daily.    . DiphenhydrAMINE HCl ([facility] ALLERGY PO) Take 1 tablet by mouth at bedtime.    . docusate sodium 100 MG CAPS Take 100 mg by mouth 2 (two) times daily. 60 capsule 0  . hydrochlorothiazide (HYDRODIURIL) 25 MG tablet Take 0.5 tablets (12.5 mg total) by mouth daily. 30 tablet 2  . naproxen sodium (ANAPROX) 220 MG tablet Take 440 mg by mouth daily.     . potassium chloride (K-DUR) 10 MEQ tablet TAKE 2  TABLETS BY MOUTH EVERY MORNING AND 1 TABLET EVERY EVENING AS DIRECTED 90 tablet 11  . potassium chloride (K-DUR,KLOR-CON) 10 MEQ tablet Take 2 tablets in AM and 1 tablet in PM 90 tablet 6  . ranitidine (ZANTAC) 300 MG tablet Take 1 tablet (300 mg total) by mouth 2 (two) times daily. 60 tablet 6  . simvastatin (ZOCOR) 10 MG tablet take 1 tablet by mouth once daily 30 tablet 6  . tamsulosin (FLOMAX) 0.4 MG CAPS capsule Take 1 capsule (0.4 mg total) by mouth daily after supper.    . ciprofloxacin (CIPRO) 500 MG tablet Take 1 tablet (500 mg total) by mouth 2 (two) times daily. For traveler's diarrhea if needed 6 tablet 0  . typhoid (VIVOTIF) DR capsule Take 1 capsule by mouth every other day. Start 2 weeks prior to travel 4 capsule 0   No facility-administered medications prior to visit.      Per HPI unless specifically indicated in ROS section below Review of Systems     Objective:    BP 128/78   Pulse 70   Temp 98.1 F (36.7 C)   Ht 5\' 7"  (1.702 m)   Wt 192 lb (87.1 kg)   SpO2 97%   BMI 30.07 kg/m   Wt Readings from  Last 3 Encounters:  04/13/17 192 lb (87.1 kg)  01/03/17 193 lb 12.8 oz (87.9 kg)  12/29/16 193 lb (87.5 kg)    Physical Exam  Constitutional: He appears well-developed and well-nourished. No distress.  HENT:  Head: Normocephalic and atraumatic.  Right Ear: Hearing, tympanic membrane, external ear and ear canal normal.  Left Ear: Hearing, tympanic membrane, external ear and ear canal normal.  Nose: Mucosal edema (nasal mucosal congestion) present. No rhinorrhea. Right sinus exhibits no maxillary sinus tenderness and no frontal sinus tenderness. Left sinus exhibits no maxillary sinus tenderness and no frontal sinus tenderness.  Mouth/Throat: Uvula is midline, oropharynx is clear and moist and mucous membranes are normal. No oropharyngeal exudate, posterior oropharyngeal edema, posterior oropharyngeal erythema or tonsillar abscesses.  Eyes: Conjunctivae and EOM are  normal. Pupils are equal, round, and reactive to light. No scleral icterus.  Neck: Normal range of motion. Neck supple.  Cardiovascular: Normal rate, regular rhythm, normal heart sounds and intact distal pulses.   No murmur heard. Pulmonary/Chest: Effort normal and breath sounds normal. No respiratory distress. He has no wheezes. He has no rales.  Lymphadenopathy:    He has no cervical adenopathy.  Skin: Skin is warm and dry. No rash noted.  Nursing note and vitals reviewed.     Assessment & Plan:   Problem List Items Addressed This Visit    Acute sinusitis - Primary    Anticipate bacterial given duration. Treat with augmentin 7d course.  Push fluids and rest  Update if not improving with treatment.          Follow up plan: Return if symptoms worsen or fail to improve.  [name], MD

## 2017-04-14 NOTE — Telephone Encounter (Signed)
Noted! Thank you

## 2017-04-16 NOTE — Telephone Encounter (Signed)
Documentation of [facility] call that occurred on 04/13/17:  Patient Name: Adrian Davis Gender: Male DOB: 05-03-1941 Age: 76 Y 10 M 20 D Return Phone Number: [phone] (Primary), [phone] (Secondary) City/State/Zip: [place] Client [place] Night - Client Client Site [place] Physician [name] - MD Who Is Calling Patient / Member / Family / Caregiver Call Type Triage / Clinical Caller Name Stollings [place] Relationship To Patient Spouse Return Phone Number [phone] (Primary) Chief Complaint Prescription Refill or Medication Request (non symptomatic) Reason for Call Symptomatic / Request for Health Information Initial Comment (CBWN) Caller's husband's Rx was not sent in. Nurse Assessment Nurse: Jake Samples, RN, Melissa Date/Time (Eastern Time): 04/13/2017 7:52:53 PM Confirm and document reason for call. If symptomatic, describe symptoms. ---Caller states that her husband was seen in the office today and his RX was not at the pharmacy. "the doctor said it was going to be the same med for him that I received" Checked with the pharmacy, but they didn't receive it. Guidelines Guideline Title Affirmed Question Disp. Time Eilene Ghazi Time) Disposition Final User 04/13/2017 8:07:35 PM Clinical Call Yes Jake Samples, RN, Melissa Comments User: Cherylin Mylar, RN Date/Time Eilene Ghazi Time): 04/13/2017 8:07:12 PM Relayed the message that Dr.Sonnenberg was calling in the medication to the [facility] that she requested it be sent to.She verbalized understanding. Paging DoctorName Phone DateTime Action Result/Outcome Notes Tommi Rumps - MD [phone] 04/13/2017 8:00:38 PM Doctor Paged Hulen Skains On Call Provider - Gwyndolyn Saxon - MD 04/13/2017 8:02:57 PM Message Result Spoke with On Call - General He stated that the medication was not called in and he is going to call it in now to the [facility]. [address] in [place]

## 2017-05-23 DIAGNOSIS — D692 Other nonthrombocytopenic purpura: Secondary | ICD-10-CM | POA: Diagnosis not present

## 2017-05-23 DIAGNOSIS — L570 Actinic keratosis: Secondary | ICD-10-CM | POA: Diagnosis not present

## 2017-05-23 DIAGNOSIS — Z85828 Personal history of other malignant neoplasm of skin: Secondary | ICD-10-CM | POA: Diagnosis not present

## 2017-05-23 DIAGNOSIS — L821 Other seborrheic keratosis: Secondary | ICD-10-CM | POA: Diagnosis not present

## 2017-05-23 DIAGNOSIS — L578 Other skin changes due to chronic exposure to nonionizing radiation: Secondary | ICD-10-CM | POA: Diagnosis not present

## 2017-05-31 DIAGNOSIS — M1712 Unilateral primary osteoarthritis, left knee: Secondary | ICD-10-CM | POA: Diagnosis not present

## 2017-07-14 ENCOUNTER — Other Ambulatory Visit: Payer: Self-pay | Admitting: Cardiology

## 2017-07-16 ENCOUNTER — Telehealth: Payer: Self-pay | Admitting: Cardiology

## 2017-07-16 NOTE — Telephone Encounter (Signed)
New Message  Pt wife called to get lab orders pt in to system for appt on 10/2. She would like to have labs completed at [facility]. Please call back to discuss

## 2017-07-16 NOTE — Telephone Encounter (Signed)
Labs already ordered. Wife notified

## 2017-07-17 ENCOUNTER — Other Ambulatory Visit (INDEPENDENT_AMBULATORY_CARE_PROVIDER_SITE_OTHER): Payer: Medicare Other

## 2017-07-17 DIAGNOSIS — R338 Other retention of urine: Secondary | ICD-10-CM | POA: Diagnosis not present

## 2017-07-17 DIAGNOSIS — N401 Enlarged prostate with lower urinary tract symptoms: Secondary | ICD-10-CM | POA: Diagnosis not present

## 2017-07-17 DIAGNOSIS — R3915 Urgency of urination: Secondary | ICD-10-CM | POA: Diagnosis not present

## 2017-07-17 DIAGNOSIS — I10 Essential (primary) hypertension: Secondary | ICD-10-CM | POA: Diagnosis not present

## 2017-07-17 DIAGNOSIS — E785 Hyperlipidemia, unspecified: Secondary | ICD-10-CM

## 2017-07-17 LAB — LIPID PANEL
Cholesterol: 146 mg/dL (ref 0–200)
HDL: 38 mg/dL — ABNORMAL LOW (ref >39.00)
LDL Cholesterol: 83 mg/dL (ref 0–99)
NonHDL: 108.42
Total CHOL/HDL Ratio: 4
Triglycerides: 125 mg/dL (ref 0.0–149.0)
VLDL: 25 mg/dL (ref 0.0–40.0)

## 2017-07-17 LAB — BASIC METABOLIC PANEL
BUN: 13 mg/dL (ref 6–23)
CO2: 30 mEq/L (ref 19–32)
Calcium: 9.7 mg/dL (ref 8.4–10.5)
Chloride: 106 mEq/L (ref 96–112)
Creatinine, Ser: 0.87 mg/dL (ref 0.40–1.50)
GFR: 90.89 mL/min (ref >60.00)
Glucose, Bld: 94 mg/dL (ref 70–99)
Potassium: 4.3 mEq/L (ref 3.5–5.1)
Sodium: 141 mEq/L (ref 135–145)

## 2017-07-17 LAB — HEPATIC FUNCTION PANEL
ALT: 12 U/L (ref 0–53)
AST: 11 U/L (ref 0–37)
Albumin: 4.3 g/dL (ref 3.5–5.2)
Alkaline Phosphatase: 52 U/L (ref 39–117)
Bilirubin, Direct: 0.1 mg/dL (ref 0.0–0.3)
Total Bilirubin: 0.5 mg/dL (ref 0.2–1.2)
Total Protein: 6.5 g/dL (ref 6.0–8.3)

## 2017-07-17 LAB — PSA: PSA: 1.2 ng/mL (ref 0.10–4.00)

## 2017-07-19 NOTE — Progress Notes (Signed)
Adrian Davis Date of Birth: 03-16-1941 Medical Record #[id number]  History of Present Illness: Adrian Davis is seen today for follow up of HTN. He has a history of HTN and HLD. No known CAD. He had a normal Adenosine Myoview back in 2008. His last echo was in January of 2012 showing grade 1 diastolic dysfunction, mild LVH and a normal EF.    On follow up today he states he is doing well. His leg swelling is better and he rarely uses HCTZ.  Notes arthritic pain in left knee and he takes Anaprox daily. He denies any chest pain or dyspnea. His wife notes he falls asleep easily during the day and thinks his memory is worse. She finds he has less patience than before. He really doesn't complain of this.   Current Outpatient Prescriptions on File Prior to Visit  Medication Sig Dispense Refill  . acetaminophen (TYLENOL) 650 MG CR tablet Take 650 mg by mouth every 8 (eight) hours as needed for pain.    Marland Kitchen aspirin EC 81 MG tablet Take 81 mg by mouth daily.    . benazepril (LOTENSIN) 20 MG tablet take 1 tablet by mouth twice a day 60 tablet 4  . diclofenac sodium (VOLTAREN) 1 % GEL Apply 1 application topically 3 (three) times daily.    . DiphenhydrAMINE HCl ([facility] ALLERGY PO) Take 1 tablet by mouth at bedtime.    . docusate sodium 100 MG CAPS Take 100 mg by mouth 2 (two) times daily. 60 capsule 0  . hydrochlorothiazide (HYDRODIURIL) 25 MG tablet Take 0.5 tablets (12.5 mg total) by mouth daily. 30 tablet 2  . naproxen sodium (ANAPROX) 220 MG tablet Take 440 mg by mouth daily.     . potassium chloride (K-DUR) 10 MEQ tablet take 2 tablets by mouth every morning and 1 tablet every evening as directed 90 tablet 2  . ranitidine (ZANTAC) 300 MG tablet Take 1 tablet (300 mg total) by mouth 2 (two) times daily. (Patient taking differently: Take 150 mg by mouth 2 (two) times daily. ) 60 tablet 6  . simvastatin (ZOCOR) 10 MG tablet take 1 tablet by mouth once daily 30 tablet 6  . tamsulosin (FLOMAX) 0.4 MG CAPS capsule  Take 1 capsule (0.4 mg total) by mouth daily after supper.     No current facility-administered medications on file prior to visit.     No Known Allergies  Past Medical History:  Diagnosis Date  . Abnormal echocardiogram Jan 2012   grade 1 diastolic dysfunction, normal EF, mild LVH  . Bell palsy 10/2010   presented with facial numbness  . 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 Degenerative disk disease   . GERD (gastroesophageal reflux disease)   . Hyperlipidemia   . Hypertension    dr [name]  . Lumbar stenosis   . Nephrolithiasis   . Normal nuclear stress test 2008  . Osteoarthritis   . Skin cancer   . Vocal cord leukoplakia    Dr Erik Obey    Past Surgical History:  Procedure Laterality Date  . KNEE ARTHROSCOPY Right   . LUMBAR LAMINECTOMY/DECOMPRESSION MICRODISCECTOMY N/A 11/12/2013   Ophelia Charter, MD - complicated by urinary retention  . SHOULDER SURGERY Bilateral   . TONSILLECTOMY      History  Smoking Status  . Former Smoker  . Packs/day: 1.50  . Years: 10.00  . Types: Cigarettes  . Quit date: 05/15/1976  Smokeless Tobacco  . Current User  .  Types: Chew    History  Alcohol Use  . Yes    Comment: occ    Family History  Problem Relation Age of Onset  . Heart failure Father 3  . Hypertension Father   . Arthritis Father   . Cancer Neg Hx   . Diabetes Neg Hx     Review of Systems: The review of systems is per the HPI.  All other systems were reviewed and are negative.  Physical Exam: BP 140/80   Pulse 66   Wt 193 lb (87.5 kg)   BMI 30.23 kg/m  GENERAL:  Well appearing WM in NAD HEENT:  PERRL, EOMI, sclera are clear. Oropharynx is clear. NECK:  No jugular venous distention, carotid upstroke brisk and symmetric, no bruits, no thyromegaly or adenopathy LUNGS:  Clear to auscultation bilaterally CHEST:  Unremarkable HEART:  RRR,  PMI not displaced or sustained,S1 and S2 within normal limits, no S3, no S4: no clicks,  no rubs, no murmurs ABD:  Soft, nontender. BS +, no masses or bruits. No hepatomegaly, no splenomegaly EXT:  2 + pulses throughout, no edema, no cyanosis no clubbing SKIN:  Warm and dry.  No rashes NEURO:  Alert and oriented x 3. Cranial nerves II through XII intact. PSYCH:  Cognitively intact     Laboratory data: Lab Results  Component Value Date   WBC 6.2 01/27/2016   HGB 14.3 01/27/2016   HCT 43.8 01/27/2016   PLT 241 01/27/2016   GLUCOSE 94 07/17/2017   CHOL 146 07/17/2017   TRIG 125.0 07/17/2017   HDL 38.00 (L) 07/17/2017   LDLDIRECT 102.0 05/15/2011   LDLCALC 83 07/17/2017   ALT 12 07/17/2017   AST 11 07/17/2017   NA 141 07/17/2017   K 4.3 07/17/2017   CL 106 07/17/2017   CREATININE 0.87 07/17/2017   BUN 13 07/17/2017   CO2 30 07/17/2017   PSA 1.20 07/17/2017   INR 1.01 11/12/2010   HGBA1C (H) 11/12/2010    5.7 (NOTE)                                                                       According to the ADA Clinical Practice Recommendations for 2011, when HbA1c is used as a screening test:   >=6.5%   Diagnostic of Diabetes Mellitus           (if abnormal result  is confirmed)  5.7-6.4%   Increased risk of developing Diabetes Mellitus  References:Diagnosis and Classification of Diabetes Mellitus,Diabetes KDTO,6712,45(YKDXI 1):S62-S69 and Standards of Medical Care in         Diabetes - 2011,Diabetes Care,2011,34  (Suppl 1):S11-S61.     Assessment / Plan: 1. HTN well controlled. Continue amlodipine and lotensin. Only uses HCTZ prn for swelling.  2. Hyperlipidemia. Labs look good. Continue current therapy  3. Osteoarthritis of knee.   Follow up in 6 months.

## 2017-07-24 ENCOUNTER — Encounter: Payer: Self-pay | Admitting: Cardiology

## 2017-07-24 ENCOUNTER — Ambulatory Visit (INDEPENDENT_AMBULATORY_CARE_PROVIDER_SITE_OTHER): Payer: Medicare Other | Admitting: Cardiology

## 2017-07-24 VITALS — BP 140/80 | HR 66 | Wt 193.0 lb

## 2017-07-24 DIAGNOSIS — E785 Hyperlipidemia, unspecified: Secondary | ICD-10-CM

## 2017-07-24 DIAGNOSIS — I10 Essential (primary) hypertension: Secondary | ICD-10-CM | POA: Diagnosis not present

## 2017-07-24 MED ORDER — AMLODIPINE BESYLATE 2.5 MG PO TABS: 2.5000 mg | ORAL_TABLET | Freq: Two times a day (BID) | ORAL | 3 refills | Status: DC

## 2017-07-24 NOTE — Patient Instructions (Signed)
Continue your current therapy  I will see you in 6 months.   

## 2017-07-31 ENCOUNTER — Other Ambulatory Visit: Payer: Self-pay | Admitting: Cardiology

## 2017-08-27 ENCOUNTER — Other Ambulatory Visit: Payer: Self-pay

## 2017-08-27 DIAGNOSIS — Z23 Encounter for immunization: Secondary | ICD-10-CM | POA: Diagnosis not present

## 2017-08-27 MED ORDER — HYDROCHLOROTHIAZIDE 25 MG PO TABS: 12.5000 mg | ORAL_TABLET | Freq: Every day | ORAL | 6 refills | Status: DC

## 2017-09-18 ENCOUNTER — Ambulatory Visit (INDEPENDENT_AMBULATORY_CARE_PROVIDER_SITE_OTHER): Payer: Medicare Other | Admitting: Family Medicine

## 2017-09-18 ENCOUNTER — Encounter: Payer: Self-pay | Admitting: Family Medicine

## 2017-09-18 VITALS — BP 136/64 | HR 84 | Temp 98.1°F | Wt 194.0 lb

## 2017-09-18 DIAGNOSIS — Z7184 Encounter for health counseling related to travel: Secondary | ICD-10-CM

## 2017-09-18 DIAGNOSIS — Z7189 Other specified counseling: Secondary | ICD-10-CM

## 2017-09-18 MED ORDER — AZITHROMYCIN 250 MG PO TABS: ORAL_TABLET | ORAL | 0 refills | Status: DC

## 2017-09-18 NOTE — Assessment & Plan Note (Signed)
Reviewed with patient upcoming trip to [place].  Given h/o recurrent bronchitis, will provide with WASP for zpack with specific indications on when to fill. Pt agrees with plan. Reviewed concerns with development of antibiotic resistance with inappropriate abx use.

## 2017-09-18 NOTE — Patient Instructions (Signed)
zpack sent to pharmacy - only take if fever >101 with productive cough that is not improving with supportive care. Most sinus and bronchitis infections are viral initially.

## 2017-09-18 NOTE — Progress Notes (Signed)
BP 136/64 (BP Location: Left Arm, Patient Position: Sitting, Cuff Size: Normal)   Pulse 84   Temp 98.1 F (36.7 C) (Oral)   Wt 194 lb (88 kg)   SpO2 96%   BMI 30.38 kg/m    CC: discuss antibiotic for upcoming trip Subjective:    Patient ID: COTTRELL GENTLES, male    DOB: Mar 14, 1941, 76 y.o.   MRN: [id number]  HPI: KIPTON SKILLEN is a 76 y.o. male presenting on 09/18/2017 for Sinus Problem and Ear Pain   Upcoming trip to [place] in [place]. Planned to be there for 22 days.   [facility] - has prescription for doxycycline for malaria prophylaxis. Was also prescribed ciprofloxacin WASP for traveler's diarrhea.  He tends to get recurrent sinusitis and bronchitis. Requests WASP for zpack to take with him on his upcoming trip.   Feels well today, denies symptoms at this time.   Relevant past medical, surgical, family and social history reviewed and updated as indicated. Interim medical history since our last visit reviewed. Allergies and medications reviewed and updated. Outpatient Medications Prior to Visit  Medication Sig Dispense Refill  . acetaminophen (TYLENOL) 650 MG CR tablet Take 650 mg by mouth every 8 (eight) hours as needed for pain.    Marland Kitchen amLODipine (NORVASC) 2.5 MG tablet Take 1 tablet (2.5 mg total) by mouth 2 (two) times daily. 270 tablet 3  . aspirin EC 81 MG tablet Take 81 mg by mouth daily.    . benazepril (LOTENSIN) 20 MG tablet take 1 tablet by mouth twice a day 60 tablet 4  . diclofenac sodium (VOLTAREN) 1 % GEL Apply 1 application topically 3 (three) times daily.    . DiphenhydrAMINE HCl ([facility] ALLERGY PO) Take 1 tablet by mouth at bedtime.    . docusate sodium 100 MG CAPS Take 100 mg by mouth 2 (two) times daily. 60 capsule 0  . hydrochlorothiazide (HYDRODIURIL) 25 MG tablet Take 0.5 tablets (12.5 mg total) daily by mouth. 30 tablet 6  . naproxen sodium (ANAPROX) 220 MG tablet Take 440 mg by mouth daily.     . pantoprazole (PROTONIX) 40 MG tablet Take  40 mg by mouth daily.    . potassium chloride (K-DUR) 10 MEQ tablet take 2 tablets by mouth every morning and 1 tablet every evening as directed 90 tablet 2  . ranitidine (ZANTAC) 300 MG tablet Take 1 tablet (300 mg total) by mouth 2 (two) times daily. (Patient taking differently: Take 150 mg by mouth 2 (two) times daily. ) 60 tablet 6  . simvastatin (ZOCOR) 10 MG tablet take 1 tablet by mouth once daily 30 tablet 6  . tamsulosin (FLOMAX) 0.4 MG CAPS capsule Take 1 capsule (0.4 mg total) by mouth daily after supper.     No facility-administered medications prior to visit.      Per HPI unless specifically indicated in ROS section below Review of Systems     Objective:    BP 136/64 (BP Location: Left Arm, Patient Position: Sitting, Cuff Size: Normal)   Pulse 84   Temp 98.1 F (36.7 C) (Oral)   Wt 194 lb (88 kg)   SpO2 96%   BMI 30.38 kg/m   Wt Readings from Last 3 Encounters:  09/18/17 194 lb (88 kg)  07/24/17 193 lb (87.5 kg)  04/13/17 192 lb (87.1 kg)    Physical Exam  Constitutional: He appears well-developed and well-nourished. No distress.  Psychiatric: He has a normal mood and  affect.  Nursing note and vitals reviewed.     Assessment & Plan:   Problem List Items Addressed This Visit    Counseling about travel - Primary    Reviewed with patient upcoming trip to [place].  Given h/o recurrent bronchitis, will provide with WASP for zpack with specific indications on when to fill. Pt agrees with plan. Reviewed concerns with development of antibiotic resistance with inappropriate abx use.           Follow up plan: Return if symptoms worsen or fail to improve.  [name], MD

## 2017-10-03 ENCOUNTER — Other Ambulatory Visit: Payer: Self-pay | Admitting: Cardiology

## 2017-10-03 NOTE — Telephone Encounter (Signed)
REFILL 

## 2017-11-20 ENCOUNTER — Other Ambulatory Visit: Payer: Self-pay

## 2017-11-20 DIAGNOSIS — I10 Essential (primary) hypertension: Secondary | ICD-10-CM

## 2017-11-20 DIAGNOSIS — E785 Hyperlipidemia, unspecified: Secondary | ICD-10-CM

## 2017-11-28 DIAGNOSIS — L578 Other skin changes due to chronic exposure to nonionizing radiation: Secondary | ICD-10-CM | POA: Diagnosis not present

## 2017-11-28 DIAGNOSIS — L821 Other seborrheic keratosis: Secondary | ICD-10-CM | POA: Diagnosis not present

## 2017-11-28 DIAGNOSIS — L820 Inflamed seborrheic keratosis: Secondary | ICD-10-CM | POA: Diagnosis not present

## 2017-11-28 DIAGNOSIS — Z85828 Personal history of other malignant neoplasm of skin: Secondary | ICD-10-CM | POA: Diagnosis not present

## 2017-11-28 DIAGNOSIS — L570 Actinic keratosis: Secondary | ICD-10-CM | POA: Diagnosis not present

## 2017-12-06 ENCOUNTER — Other Ambulatory Visit: Payer: Self-pay | Admitting: *Deleted

## 2017-12-06 MED ORDER — BENAZEPRIL HCL 20 MG PO TABS: 20.0000 mg | ORAL_TABLET | Freq: Two times a day (BID) | ORAL | 5 refills | Status: DC

## 2018-01-11 ENCOUNTER — Encounter: Payer: Self-pay | Admitting: Cardiology

## 2018-01-14 ENCOUNTER — Other Ambulatory Visit: Payer: Self-pay | Admitting: Family Medicine

## 2018-01-14 DIAGNOSIS — R3915 Urgency of urination: Secondary | ICD-10-CM

## 2018-01-14 DIAGNOSIS — N401 Enlarged prostate with lower urinary tract symptoms: Secondary | ICD-10-CM

## 2018-01-14 DIAGNOSIS — R202 Paresthesia of skin: Secondary | ICD-10-CM

## 2018-01-14 DIAGNOSIS — E785 Hyperlipidemia, unspecified: Secondary | ICD-10-CM

## 2018-01-15 ENCOUNTER — Other Ambulatory Visit (INDEPENDENT_AMBULATORY_CARE_PROVIDER_SITE_OTHER): Payer: Medicare Other

## 2018-01-15 DIAGNOSIS — R202 Paresthesia of skin: Secondary | ICD-10-CM

## 2018-01-15 DIAGNOSIS — N401 Enlarged prostate with lower urinary tract symptoms: Secondary | ICD-10-CM

## 2018-01-15 DIAGNOSIS — E785 Hyperlipidemia, unspecified: Secondary | ICD-10-CM

## 2018-01-15 DIAGNOSIS — R3915 Urgency of urination: Secondary | ICD-10-CM

## 2018-01-15 LAB — VITAMIN B12: Vitamin B-12: 210 pg/mL — ABNORMAL LOW (ref 211–911)

## 2018-01-15 LAB — LIPID PANEL
Cholesterol: 147 mg/dL (ref 0–200)
HDL: 41.2 mg/dL (ref >39.00)
LDL Cholesterol: 79 mg/dL (ref 0–99)
NonHDL: 105.47
Total CHOL/HDL Ratio: 4
Triglycerides: 130 mg/dL (ref 0.0–149.0)
VLDL: 26 mg/dL (ref 0.0–40.0)

## 2018-01-15 LAB — COMPREHENSIVE METABOLIC PANEL
ALT: 15 U/L (ref 0–53)
AST: 13 U/L (ref 0–37)
Albumin: 4.2 g/dL (ref 3.5–5.2)
Alkaline Phosphatase: 55 U/L (ref 39–117)
BUN: 19 mg/dL (ref 6–23)
CO2: 29 mEq/L (ref 19–32)
Calcium: 9.5 mg/dL (ref 8.4–10.5)
Chloride: 104 mEq/L (ref 96–112)
Creatinine, Ser: 0.84 mg/dL (ref 0.40–1.50)
GFR: 94.52 mL/min (ref >60.00)
Glucose, Bld: 109 mg/dL — ABNORMAL HIGH (ref 70–99)
Potassium: 4.3 mEq/L (ref 3.5–5.1)
Sodium: 138 mEq/L (ref 135–145)
Total Bilirubin: 0.5 mg/dL (ref 0.2–1.2)
Total Protein: 6.7 g/dL (ref 6.0–8.3)

## 2018-01-15 LAB — PSA: PSA: 1.27 ng/mL (ref 0.10–4.00)

## 2018-01-15 LAB — TSH: TSH: 4.53 u[IU]/mL — ABNORMAL HIGH (ref 0.35–4.50)

## 2018-01-21 ENCOUNTER — Other Ambulatory Visit: Payer: Self-pay | Admitting: Family Medicine

## 2018-01-21 ENCOUNTER — Encounter: Payer: Self-pay | Admitting: Family Medicine

## 2018-01-21 DIAGNOSIS — E538 Deficiency of other specified B group vitamins: Secondary | ICD-10-CM | POA: Insufficient documentation

## 2018-01-21 MED ORDER — B-12 1000 MCG SL SUBL: 1.0000 | SUBLINGUAL_TABLET | Freq: Every day | SUBLINGUAL | Status: DC

## 2018-01-21 NOTE — Progress Notes (Signed)
Adrian Davis Date of Birth: 10/25/1940 Medical Record #[id number]  History of Present Illness: Adrian Davis is seen today for follow up of HTN. He has a history of HTN and HLD. No known CAD. He had a normal Adenosine Myoview back in 2008. His last echo was in January of 2012 showing grade 1 diastolic dysfunction, mild LVH and a normal EF.    On follow up today he states he is doing well. He denies any chest pain, dyspnea, edema, palpitations. Feels energy level is good and still will work 14 hour days. Wife states he falls asleep easily when he sits down.   Current Outpatient Medications on File Prior to Visit  Medication Sig Dispense Refill  . acetaminophen (TYLENOL) 650 MG CR tablet Take 650 mg by mouth every 8 (eight) hours as needed for pain.    Marland Kitchen amLODipine (NORVASC) 2.5 MG tablet take 2 tablets by mouth every morning and 1 tablet IN THE AFTERNOON AS DIRECTED 270 tablet 1  . aspirin EC 81 MG tablet Take 81 mg by mouth daily.    Marland Kitchen azithromycin (ZITHROMAX) 250 MG tablet Take two tablets on day one followed by one tablet on days 2-5 6 each 0  . benazepril (LOTENSIN) 20 MG tablet Take 1 tablet (20 mg total) by mouth 2 (two) times daily. 60 tablet 5  . Cyanocobalamin (B-12) 1000 MCG SUBL Place 1 tablet under the tongue daily. 30 each   . diclofenac sodium (VOLTAREN) 1 % GEL Apply 1 application topically 3 (three) times daily.    . DiphenhydrAMINE HCl ([facility] ALLERGY PO) Take 1 tablet by mouth at bedtime.    . hydrochlorothiazide (HYDRODIURIL) 25 MG tablet Take 0.5 tablets (12.5 mg total) daily by mouth. 30 tablet 6  . naproxen sodium (ANAPROX) 220 MG tablet Take 440 mg by mouth daily.     . pantoprazole (PROTONIX) 40 MG tablet Take 40 mg by mouth daily.    . potassium chloride (K-DUR) 10 MEQ tablet take 2 tablets by mouth every morning and 1 tablet every evening as directed 270 tablet 1  . ranitidine (ZANTAC) 300 MG tablet Take 1 tablet (300 mg total) by mouth 2 (two) times daily. (Patient taking  differently: Take 150 mg by mouth 2 (two) times daily. ) 60 tablet 6  . simvastatin (ZOCOR) 10 MG tablet take 1 tablet by mouth once daily 30 tablet 6  . tamsulosin (FLOMAX) 0.4 MG CAPS capsule Take 1 capsule (0.4 mg total) by mouth daily after supper.     No current facility-administered medications on file prior to visit.     No Known Allergies  Past Medical History:  Diagnosis Date  . Abnormal echocardiogram Jan 2012   grade 1 diastolic dysfunction, normal EF, mild LVH  . Bell palsy 10/2010   presented with facial numbness  . 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 Degenerative disk disease   . GERD (gastroesophageal reflux disease)   . Hyperlipidemia   . Hypertension    dr Yeimy Brabant [place]  . Lumbar stenosis   . Nephrolithiasis   . Normal nuclear stress test 2008  . Osteoarthritis   . Skin cancer   . Vocal cord leukoplakia    Dr Erik Obey    Past Surgical History:  Procedure Laterality Date  . KNEE ARTHROSCOPY Right   . LUMBAR LAMINECTOMY/DECOMPRESSION MICRODISCECTOMY N/A 11/12/2013   Ophelia Charter, MD - complicated by urinary retention  . SHOULDER SURGERY Bilateral   . TONSILLECTOMY  Social History   Tobacco Use  Smoking Status Former Smoker  . Packs/day: 1.50  . Years: 10.00  . Pack years: 15.00  . Types: Cigarettes  . Last attempt to quit: 05/15/1976  . Years since quitting: 41.7  Smokeless Tobacco Current User  . Types: Chew    Social History   Substance and Sexual Activity  Alcohol Use Yes   Comment: occ    Family History  Problem Relation Age of Onset  . Heart failure Father 75  . Hypertension Father   . Arthritis Father   . Cancer Neg Hx   . Diabetes Neg Hx     Review of Systems: The review of systems is per the HPI.  All other systems were reviewed and are negative.  Physical Exam: BP 112/64 (BP Location: Right Arm, Cuff Size: Normal)   Pulse 70   Ht 5\' 8"  (1.727 m)   Wt 189 lb 9.6 oz (86 kg)   BMI 28.83 kg/m   GENERAL:  Well appearing WM in NAD HEENT:  PERRL, EOMI, sclera are clear. Oropharynx is clear. NECK:  No jugular venous distention, carotid upstroke brisk and symmetric, no bruits, no thyromegaly or adenopathy LUNGS:  Clear to auscultation bilaterally CHEST:  Unremarkable HEART:  RRR,  PMI not displaced or sustained,S1 and S2 within normal limits, no S3, no S4: no clicks, no rubs, no murmurs ABD:  Soft, nontender. BS +, no masses or bruits. No hepatomegaly, no splenomegaly EXT:  2 + pulses throughout, no edema, no cyanosis no clubbing SKIN:  Warm and dry.  No rashes NEURO:  Alert and oriented x 3. Cranial nerves II through XII intact. PSYCH:  Cognitively intact   Laboratory data: Lab Results  Component Value Date   WBC 6.2 01/27/2016   HGB 14.3 01/27/2016   HCT 43.8 01/27/2016   PLT 241 01/27/2016   GLUCOSE 109 (H) 01/15/2018   CHOL 147 01/15/2018   TRIG 130.0 01/15/2018   HDL 41.20 01/15/2018   LDLDIRECT 102.0 05/15/2011   LDLCALC 79 01/15/2018   ALT 15 01/15/2018   AST 13 01/15/2018   NA 138 01/15/2018   K 4.3 01/15/2018   CL 104 01/15/2018   CREATININE 0.84 01/15/2018   BUN 19 01/15/2018   CO2 29 01/15/2018   TSH 4.53 (H) 01/15/2018   PSA 1.27 01/15/2018   INR 1.01 11/12/2010   HGBA1C (H) 11/12/2010    5.7 (NOTE)                                                                       According to the ADA Clinical Practice Recommendations for 2011, when HbA1c is used as a screening test:   >=6.5%   Diagnostic of Diabetes Mellitus           (if abnormal result  is confirmed)  5.7-6.4%   Increased risk of developing Diabetes Mellitus  References:Diagnosis and Classification of Diabetes Mellitus,Diabetes ZOXW,9604,54(UJWJX 1):S62-S69 and Standards of Medical Care in         Diabetes - 2011,Diabetes Care,2011,34  (Suppl 1):S11-S61.    Ecg today shows NSR with nonspecific T wave abnormality. I have personally reviewed and interpreted this study.  Assessment / Plan: 1. HTN  well controlled. Continue amlodipine and lotensin.  Only uses HCTZ prn for swelling.  2. Hyperlipidemia. Labs look good. Continue current therapy   Follow up in 6 months.

## 2018-01-22 ENCOUNTER — Other Ambulatory Visit: Payer: Self-pay

## 2018-01-22 ENCOUNTER — Encounter: Payer: Self-pay | Admitting: Cardiology

## 2018-01-22 ENCOUNTER — Ambulatory Visit (INDEPENDENT_AMBULATORY_CARE_PROVIDER_SITE_OTHER): Payer: Medicare Other | Admitting: Cardiology

## 2018-01-22 VITALS — BP 112/64 | HR 70 | Ht 68.0 in | Wt 189.6 lb

## 2018-01-22 DIAGNOSIS — E7800 Pure hypercholesterolemia, unspecified: Secondary | ICD-10-CM | POA: Diagnosis not present

## 2018-01-22 DIAGNOSIS — I10 Essential (primary) hypertension: Secondary | ICD-10-CM | POA: Diagnosis not present

## 2018-01-22 MED ORDER — DOCUSATE SODIUM 100 MG PO CAPS: 100.0000 mg | ORAL_CAPSULE | Freq: Two times a day (BID) | ORAL | 3 refills | Status: DC

## 2018-01-22 NOTE — Patient Instructions (Signed)
Continue your current therapy  I will see you in 6 months.   

## 2018-01-25 DIAGNOSIS — M1712 Unilateral primary osteoarthritis, left knee: Secondary | ICD-10-CM | POA: Diagnosis not present

## 2018-02-04 DIAGNOSIS — N5201 Erectile dysfunction due to arterial insufficiency: Secondary | ICD-10-CM | POA: Diagnosis not present

## 2018-02-04 DIAGNOSIS — R351 Nocturia: Secondary | ICD-10-CM | POA: Diagnosis not present

## 2018-02-04 DIAGNOSIS — N401 Enlarged prostate with lower urinary tract symptoms: Secondary | ICD-10-CM | POA: Diagnosis not present

## 2018-02-04 DIAGNOSIS — N3281 Overactive bladder: Secondary | ICD-10-CM | POA: Diagnosis not present

## 2018-03-12 DIAGNOSIS — Z85828 Personal history of other malignant neoplasm of skin: Secondary | ICD-10-CM | POA: Diagnosis not present

## 2018-03-12 DIAGNOSIS — D485 Neoplasm of uncertain behavior of skin: Secondary | ICD-10-CM | POA: Diagnosis not present

## 2018-03-19 DIAGNOSIS — H02825 Cysts of left lower eyelid: Secondary | ICD-10-CM | POA: Diagnosis not present

## 2018-03-19 DIAGNOSIS — L720 Epidermal cyst: Secondary | ICD-10-CM | POA: Diagnosis not present

## 2018-04-08 ENCOUNTER — Other Ambulatory Visit: Payer: Self-pay | Admitting: *Deleted

## 2018-04-08 MED ORDER — SIMVASTATIN 10 MG PO TABS: 10.0000 mg | ORAL_TABLET | Freq: Every day | ORAL | 8 refills | Status: DC

## 2018-05-01 ENCOUNTER — Other Ambulatory Visit: Payer: Self-pay | Admitting: *Deleted

## 2018-05-01 MED ORDER — POTASSIUM CHLORIDE ER 10 MEQ PO TBCR: EXTENDED_RELEASE_TABLET | ORAL | 1 refills | Status: DC

## 2018-05-30 ENCOUNTER — Other Ambulatory Visit: Payer: Self-pay | Admitting: Family Medicine

## 2018-05-30 DIAGNOSIS — M1712 Unilateral primary osteoarthritis, left knee: Secondary | ICD-10-CM | POA: Diagnosis not present

## 2018-05-30 NOTE — Telephone Encounter (Signed)
Zantac refill Last OV:04/13/17 Last refill:01/03/17 60 tab/6 refill [id number] Pharmacy: [facility] [place], [place] [phone] (Phone) [phone] (Fax)

## 2018-05-30 NOTE — Telephone Encounter (Signed)
Copied from [place] [id number]. Topic: Quick Communication - Rx Refill/Question >> May 30, 2018 12:10 PM Reyne Dumas L wrote: Medication: ranitidine (ZANTAC) 300 MG tablet  Has the patient contacted their pharmacy? Yes - states pharmacy hasn't had a response (Agent: If no, request that the patient contact the pharmacy for the refill.) (Agent: If yes, when and what did the pharmacy advise?)  Preferred Pharmacy (with phone number or street name): [id number] - [place], [place] - [place] [phone] (Phone) [phone] (Fax)  Agent: Please be advised that RX refills may take up to 3 business days. We ask that you follow-up with your pharmacy.

## 2018-05-31 MED ORDER — RANITIDINE HCL 300 MG PO TABS: 150.0000 mg | ORAL_TABLET | Freq: Two times a day (BID) | ORAL | 1 refills | Status: DC

## 2018-05-31 NOTE — Telephone Encounter (Signed)
On med list has zantac 300 mg taking one tab bid and taking 1/2 tab bid. Per 01/03/17 annual office visit note has take zantac 300 mg 1/2 tab bid. Also pt was to have CPX in one year. No visit so far and no future appts scheduled. Will refill zantac 300 mg taking 1/2 tab bid per 01/03/17 office note and note added to refill request to call for CPX appt. I spoke with Adrian Davis and pt has been taking zantac 300 mg 1/2 tab bid and she will talk with pt and cb to schedule CPX. FYI to Dr Darnell Level for review.

## 2018-05-31 NOTE — Telephone Encounter (Signed)
Noted! Thank you

## 2018-06-06 ENCOUNTER — Telehealth: Payer: Self-pay | Admitting: Cardiology

## 2018-06-06 MED ORDER — BENAZEPRIL HCL 20 MG PO TABS: 20.0000 mg | ORAL_TABLET | Freq: Two times a day (BID) | ORAL | 5 refills | Status: DC

## 2018-06-06 NOTE — Telephone Encounter (Signed)
New Message    *STAT* If patient is at the pharmacy, call can be transferred to refill team.   1. Which medications need to be refilled? (please list name of each medication and dose if known) benazepril (LOTENSIN) 20 MG tablet Take 1 tablet (20 mg total) by mouth 2 (two) times daily  2. Which pharmacy/location (including street and city if local pharmacy) is medication to be sent to?[id number] - [place], [place] - [address]  3. Do they need a 30 day or 90 day supply? 90 day

## 2018-07-23 ENCOUNTER — Telehealth: Payer: Self-pay | Admitting: *Deleted

## 2018-07-23 NOTE — Telephone Encounter (Signed)
Lm on pts vm requesting a call back regarding scheduled appt tomorrow.Should pt return call, pls triage to confirm if pt is needing to go to ED. CRM created

## 2018-07-23 NOTE — Telephone Encounter (Signed)
Left v/m asking that someone call [facility] to discuss symptoms in appt edit note. 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 Mrs Esperanza said he was OK before he left going to the field this morning. When pt comes home tonight if severe stomach pain, N&V or any bleeding should go to ED otherwise pt will keep appt 07/24/18 with Dr Darnell Level. Mrs Reim did say pt is eating more than a few tablespoons of food at a time but is not eating like he used to. FYI to Dr Darnell Level.

## 2018-07-23 NOTE — Telephone Encounter (Signed)
Noted. Will see tomorrow. 

## 2018-07-24 ENCOUNTER — Ambulatory Visit (INDEPENDENT_AMBULATORY_CARE_PROVIDER_SITE_OTHER): Payer: Medicare Other | Admitting: Family Medicine

## 2018-07-24 ENCOUNTER — Encounter: Payer: Self-pay | Admitting: Family Medicine

## 2018-07-24 ENCOUNTER — Ambulatory Visit (INDEPENDENT_AMBULATORY_CARE_PROVIDER_SITE_OTHER)
Admission: RE | Admit: 2018-07-24 | Discharge: 2018-07-24 | Disposition: A | Discharge status: Home / Self Care | Payer: Medicare Other | Source: Ambulatory Visit | Attending: Family Medicine | Admitting: Family Medicine

## 2018-07-24 ENCOUNTER — Ambulatory Visit
Admission: RE | Admit: 2018-07-24 | Discharge: 2018-07-24 | Disposition: A | Discharge status: Home / Self Care | Payer: Medicare Other | Source: Ambulatory Visit | Attending: Family Medicine | Admitting: Family Medicine

## 2018-07-24 VITALS — BP 122/82 | HR 80 | Temp 98.3°F | Ht 67.0 in | Wt 177.2 lb

## 2018-07-24 DIAGNOSIS — E538 Deficiency of other specified B group vitamins: Secondary | ICD-10-CM | POA: Diagnosis not present

## 2018-07-24 DIAGNOSIS — R1030 Lower abdominal pain, unspecified: Secondary | ICD-10-CM | POA: Diagnosis not present

## 2018-07-24 DIAGNOSIS — R634 Abnormal weight loss: Secondary | ICD-10-CM

## 2018-07-24 DIAGNOSIS — N134 Hydroureter: Secondary | ICD-10-CM | POA: Diagnosis not present

## 2018-07-24 DIAGNOSIS — N201 Calculus of ureter: Secondary | ICD-10-CM | POA: Insufficient documentation

## 2018-07-24 DIAGNOSIS — N400 Enlarged prostate without lower urinary tract symptoms: Secondary | ICD-10-CM | POA: Insufficient documentation

## 2018-07-24 DIAGNOSIS — N281 Cyst of kidney, acquired: Secondary | ICD-10-CM | POA: Insufficient documentation

## 2018-07-24 DIAGNOSIS — I10 Essential (primary) hypertension: Secondary | ICD-10-CM

## 2018-07-24 DIAGNOSIS — K5900 Constipation, unspecified: Secondary | ICD-10-CM | POA: Diagnosis not present

## 2018-07-24 DIAGNOSIS — Z23 Encounter for immunization: Secondary | ICD-10-CM | POA: Diagnosis not present

## 2018-07-24 LAB — POCT I-STAT CREATININE: Creatinine, Ser: 1 mg/dL (ref 0.61–1.24)

## 2018-07-24 MED ORDER — IOPAMIDOL (ISOVUE-300) INJECTION 61%
100.0000 mL | Freq: Once | INTRAVENOUS | Status: AC | PRN
  Administered 2018-07-24: 100 mL via INTRAVENOUS

## 2018-07-24 MED ORDER — POLYETHYLENE GLYCOL 3350 17 GM/SCOOP PO POWD: 17.0000 g | Freq: Every day | ORAL | 1 refills | Status: DC

## 2018-07-24 NOTE — Patient Instructions (Addendum)
Flu shot today Labs and xray today. See our referral coordinators for GI appointment and CT scan we will schedule soon. We will be in touch with results.

## 2018-07-24 NOTE — Assessment & Plan Note (Signed)
With weight loss, BP better controlled even off hctz. May be able to decrease potassium supplement - check K today.

## 2018-07-24 NOTE — Progress Notes (Signed)
BP 122/82 (BP Location: Left Arm, Patient Position: Sitting, Cuff Size: Normal)   Pulse 80   Temp 98.3 F (36.8 C) (Oral)   Ht 5\' 7"  (1.702 m)   Wt 177 lb 4 oz (80.4 kg)   SpO2 96%   BMI 27.76 kg/m    CC: abd pain Subjective:    Patient ID: Adrian Davis, male    DOB: 08/11/1941, 77 y.o.   MRN: [id number]  HPI: Adrian Davis is a 77 y.o. male presenting on 07/24/2018 for Abdominal Pain (C/o severe lower abd pain for 9-12 mos. Last episoded was 07/20/18. States he is constipated. Had a bout of diarrhea on 07/21/18.  Feels like he never emptied. Pt accompanied by his wife.)   Last seen here 08/2017 - 17 lb weight loss since then.  Dental work over the past year - new dentures in this past year.   Endorses 9 mo h/o lower abdominal cramping discomfort associated with constipation, latest episode was this past weekend. Ongoing constipation, alternating with diarrhea. No blood in stool. Appetite decreased. Loss of taste. Chronic constipation.   No fevers/chills, nausea/vomiting. No dysphagia, GERD symptoms.  Last wellness visit 12/2016.  Previously endorsed remote colonoscopy - now states he's never had one. Never completed cologuard from 12/2016.   Relevant past medical, surgical, family and social history reviewed and updated as indicated. Interim medical history since our last visit reviewed. Allergies and medications reviewed and updated. Outpatient Medications Prior to Visit  Medication Sig Dispense Refill  . acetaminophen (TYLENOL) 650 MG CR tablet Take 650 mg by mouth every 8 (eight) hours as needed for pain.    Marland Kitchen amLODipine (NORVASC) 2.5 MG tablet take 2 tablets by mouth every morning and 1 tablet IN THE AFTERNOON AS DIRECTED 270 tablet 1  . aspirin EC 81 MG tablet Take 81 mg by mouth daily.    . benazepril (LOTENSIN) 20 MG tablet Take 1 tablet (20 mg total) by mouth 2 (two) times daily. 60 tablet 5  . Cyanocobalamin (B-12) 1000 MCG SUBL Place 1 tablet under the tongue daily. 30  each   . diclofenac sodium (VOLTAREN) 1 % GEL Apply 1 application topically 3 (three) times daily.    . DiphenhydrAMINE HCl ([facility] ALLERGY PO) Take 1 tablet by mouth at bedtime.    . docusate sodium (COLACE) 100 MG capsule Take 1 capsule (100 mg total) by mouth 2 (two) times daily. 180 capsule 3  . mirabegron ER (MYRBETRIQ) 25 MG TB24 tablet Take 1 tablet by mouth daily.    . naproxen sodium (ANAPROX) 220 MG tablet Take 440 mg by mouth daily.     . pantoprazole (PROTONIX) 40 MG tablet Take 40 mg by mouth daily.    . potassium chloride (K-DUR) 10 MEQ tablet TAKE 2 TABLETS (20 MG) IN THE MORNING AND 1 TABLET (10 MG) IN THE EVENING. 270 tablet 1  . ranitidine (ZANTAC) 300 MG tablet Take 0.5 tablets (150 mg total) by mouth 2 (two) times daily. 90 tablet 1  . simvastatin (ZOCOR) 10 MG tablet Take 1 tablet (10 mg total) by mouth daily. 30 tablet 8  . tamsulosin (FLOMAX) 0.4 MG CAPS capsule Take 1 capsule (0.4 mg total) by mouth daily after supper.    Marland Kitchen azithromycin (ZITHROMAX) 250 MG tablet Take two tablets on day one followed by one tablet on days 2-5 6 each 0  . hydrochlorothiazide (HYDRODIURIL) 25 MG tablet Take 0.5 tablets (12.5 mg total) daily by mouth. (Patient not taking: Reported  on 07/24/2018) 30 tablet 6   No facility-administered medications prior to visit.      Per HPI unless specifically indicated in ROS section below Review of Systems     Objective:    BP 122/82 (BP Location: Left Arm, Patient Position: Sitting, Cuff Size: Normal)   Pulse 80   Temp 98.3 F (36.8 C) (Oral)   Ht 5\' 7"  (1.702 m)   Wt 177 lb 4 oz (80.4 kg)   SpO2 96%   BMI 27.76 kg/m   Wt Readings from Last 3 Encounters:  07/24/18 177 lb 4 oz (80.4 kg)  01/22/18 189 lb 9.6 oz (86 kg)  09/18/17 194 lb (88 kg)    Physical Exam  Constitutional: He appears well-developed and well-nourished. No distress.  HENT:  Mouth/Throat: Oropharynx is clear and moist. No oropharyngeal exudate.  Upper and lower dentures    Eyes: Pupils are equal, round, and reactive to light. EOM are normal.  Cardiovascular: Normal rate, regular rhythm and normal heart sounds.  No murmur heard. Pulmonary/Chest: Effort normal and breath sounds normal. No respiratory distress. He has no wheezes. He has no rales.  Abdominal: Soft. He exhibits no distension and no mass. Bowel sounds are increased. There is no hepatosplenomegaly. There is tenderness (mild) in the epigastric area. There is no rebound, no guarding, no CVA tenderness and negative Murphy's sign. No hernia.  Musculoskeletal: He exhibits no edema.  Neurological: He is alert.  Skin: Skin is warm and dry. No rash noted.  Psychiatric: He has a normal mood and affect.  Nursing note and vitals reviewed.  Results for orders placed or performed in visit on 01/15/18  PSA  Result Value Ref Range   PSA 1.27 0.10 - 4.00 ng/mL  TSH  Result Value Ref Range   TSH 4.53 (H) 0.35 - 4.50 uIU/mL  Comprehensive metabolic panel  Result Value Ref Range   Sodium 138 135 - 145 mEq/L   Potassium 4.3 3.5 - 5.1 mEq/L   Chloride 104 96 - 112 mEq/L   CO2 29 19 - 32 mEq/L   Glucose, Bld 109 (H) 70 - 99 mg/dL   BUN 19 6 - 23 mg/dL   Creatinine, Ser 0.84 0.40 - 1.50 mg/dL   Total Bilirubin 0.5 0.2 - 1.2 mg/dL   Alkaline Phosphatase 55 39 - 117 U/L   AST 13 0 - 37 U/L   ALT 15 0 - 53 U/L   Total Protein 6.7 6.0 - 8.3 g/dL   Albumin 4.2 3.5 - 5.2 g/dL   Calcium 9.5 8.4 - 10.5 mg/dL   GFR 94.52 >60.00 mL/min  Lipid panel  Result Value Ref Range   Cholesterol 147 0 - 200 mg/dL   Triglycerides 130.0 0.0 - 149.0 mg/dL   HDL 41.20 >39.00 mg/dL   VLDL 26.0 0.0 - 40.0 mg/dL   LDL Cholesterol 79 0 - 99 mg/dL   Total CHOL/HDL Ratio 4    NonHDL 105.47   Vitamin B12  Result Value Ref Range   Vitamin B-12 210 (L) 211 - 911 pg/mL      Assessment & Plan:   Problem List Items Addressed This Visit    Vitamin B12 deficiency    Reports compliance with 1000 mcg daily - will update levels  today.       Relevant Orders   Vitamin B12   Lower abdominal pain - Primary    Describes lower abdominal cramping in chronic constipation, with weight loss and alternating diarrhea (bowel changes) in someone  who has not completed colon cancer screening - discussed concerns. Check labs today (CBC, CMP) as well as 2 view abd series. Will also check CT and refer to GI for colon evaluation. Pt and wife agree with plan.  In interim, recommended colace BID and miralax PRN for chronic constipation.       Relevant Orders   Comprehensive metabolic panel   CBC with Differential/Platelet   CT Abdomen Pelvis W Contrast   DG Abd 2 Views   Ambulatory referral to Gastroenterology   Essential hypertension    With weight loss, BP better controlled even off hctz. May be able to decrease potassium supplement - check K today.       Abnormal weight loss    See above.       Relevant Orders   CT Abdomen Pelvis W Contrast   Ambulatory referral to Gastroenterology    Other Visit Diagnoses    Need for influenza vaccination       Relevant Orders   Flu Vaccine QUAD 36+ mos IM (Completed)       Meds ordered this encounter  Medications  . polyethylene glycol powder (GLYCOLAX/MIRALAX) powder    Sig: Take 17 g by mouth daily.    Dispense:  3350 g    Refill:  1   Orders Placed This Encounter  Procedures  . CT Abdomen Pelvis W Contrast    Standing Status:   Future    Standing Expiration Date:   10/25/2019    Order Specific Question:   ** REASON FOR EXAM (FREE TEXT)    Answer:   lower abd pain, bowel changes, weight loss    Order Specific Question:   If indicated for the ordered procedure, I authorize the administration of contrast media per Radiology protocol    Answer:   Yes    Order Specific Question:   Preferred imaging location?    Answer:   [facility]    Order Specific Question:   Is Oral Contrast requested for this exam?    Answer:   Yes, Per Radiology protocol    Order Specific  Question:   Radiology Contrast Protocol - do NOT remove file path    Answer:   \\[email or web address]\CTProtocols.pdf  . DG Abd 2 Views    Standing Status:   Future    Number of Occurrences:   1    Standing Expiration Date:   09/24/2019    Order Specific Question:   Reason for Exam (SYMPTOM  OR DIAGNOSIS REQUIRED)    Answer:   lower abd pain ,constipation, weight loss    Order Specific Question:   Preferred imaging location?    Answer:   [facility]    Order Specific Question:   Radiology Contrast Protocol - do NOT remove file path    Answer:   \\[email or web address]\[email or web address]\DXFluoroContrastProtocols.pdf  . Flu Vaccine QUAD 36+ mos IM  . Comprehensive metabolic panel  . CBC with Differential/Platelet  . Vitamin B12  . Ambulatory referral to Gastroenterology    Referral Priority:   Routine    Referral Type:   Consultation    Referral Reason:   Specialty Services Required    Number of Visits Requested:   1    Follow up plan: Return if symptoms worsen or fail to improve.  [name], MD

## 2018-07-24 NOTE — Assessment & Plan Note (Signed)
Describes lower abdominal cramping in chronic constipation, with weight loss and alternating diarrhea (bowel changes) in someone who has not completed colon cancer screening - discussed concerns. Check labs today (CBC, CMP) as well as 2 view abd series. Will also check CT and refer to GI for colon evaluation. Pt and wife agree with plan.  In interim, recommended colace BID and miralax PRN for chronic constipation.

## 2018-07-24 NOTE — Assessment & Plan Note (Signed)
Reports compliance with 1000 mcg daily - will update levels today.

## 2018-07-24 NOTE — Assessment & Plan Note (Signed)
See above

## 2018-07-25 ENCOUNTER — Ambulatory Visit: Payer: Medicare Other

## 2018-07-25 LAB — CBC WITH DIFFERENTIAL/PLATELET
Basophils Absolute: 0 10*3/uL (ref 0.0–0.1)
Basophils Relative: 0.7 % (ref 0.0–3.0)
Eosinophils Absolute: 0 10*3/uL (ref 0.0–0.7)
Eosinophils Relative: 0.8 % (ref 0.0–5.0)
HCT: 41.3 % (ref 39.0–52.0)
Hemoglobin: 13.6 g/dL (ref 13.0–17.0)
Lymphocytes Relative: 33.4 % (ref 12.0–46.0)
Lymphs Abs: 1.9 10*3/uL (ref 0.7–4.0)
MCHC: 32.9 g/dL (ref 30.0–36.0)
MCV: 93 fl (ref 78.0–100.0)
Monocytes Absolute: 0.5 10*3/uL (ref 0.1–1.0)
Monocytes Relative: 8.1 % (ref 3.0–12.0)
Neutro Abs: 3.2 10*3/uL (ref 1.4–7.7)
Neutrophils Relative %: 57 % (ref 43.0–77.0)
Platelets: 226 10*3/uL (ref 150.0–400.0)
RBC: 4.44 Mil/uL (ref 4.22–5.81)
RDW: 13.9 % (ref 11.5–15.5)
WBC: 5.6 10*3/uL (ref 4.0–10.5)

## 2018-07-25 LAB — COMPREHENSIVE METABOLIC PANEL
ALT: 12 U/L (ref 0–53)
AST: 12 U/L (ref 0–37)
Albumin: 4.3 g/dL (ref 3.5–5.2)
Alkaline Phosphatase: 48 U/L (ref 39–117)
BUN: 18 mg/dL (ref 6–23)
CO2: 29 mEq/L (ref 19–32)
Calcium: 9.7 mg/dL (ref 8.4–10.5)
Chloride: 105 mEq/L (ref 96–112)
Creatinine, Ser: 1 mg/dL (ref 0.40–1.50)
GFR: 77.18 mL/min (ref >60.00)
Glucose, Bld: 99 mg/dL (ref 70–99)
Potassium: 4 mEq/L (ref 3.5–5.1)
Sodium: 140 mEq/L (ref 135–145)
Total Bilirubin: 0.3 mg/dL (ref 0.2–1.2)
Total Protein: 7.1 g/dL (ref 6.0–8.3)

## 2018-07-25 LAB — VITAMIN B12: Vitamin B-12: 1061 pg/mL — ABNORMAL HIGH (ref 211–911)

## 2018-07-26 ENCOUNTER — Other Ambulatory Visit: Payer: Self-pay | Admitting: Family Medicine

## 2018-07-26 ENCOUNTER — Telehealth: Payer: Self-pay | Admitting: Family Medicine

## 2018-07-26 MED ORDER — B-12 1000 MCG SL SUBL: 1.0000 | SUBLINGUAL_TABLET | SUBLINGUAL | Status: DC

## 2018-07-26 NOTE — Telephone Encounter (Signed)
Copied from [place] [phone]. Topic: Quick Communication - See Telephone Encounter >> Jul 26, 2018  1:29 PM Bea Graff, NT wrote: CRM for notification. See Telephone encounter for: 07/26/18. Pts wife calling to see if they can receive her husbands lab results. They received the xray results, but not the lab results.

## 2018-07-26 NOTE — Telephone Encounter (Signed)
See result note.  Thanks!

## 2018-07-29 ENCOUNTER — Telehealth: Payer: Self-pay | Admitting: Family Medicine

## 2018-07-29 DIAGNOSIS — Z1211 Encounter for screening for malignant neoplasm of colon: Secondary | ICD-10-CM

## 2018-07-29 NOTE — Telephone Encounter (Signed)
Called Mrs Grippi to see who they want to be Referred to for GI. She said her husband will not stop working to go to a GI Dr. 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 He will only do something if he is hurting she said and he is not hurting right now so she says to put the Referral on hold. Will defer this Referral for 1 month and will check back in with them.

## 2018-08-03 NOTE — Addendum Note (Signed)
Addended by: [name] on: 08/03/2018 06:19 PM   Modules accepted: Orders

## 2018-08-03 NOTE — Telephone Encounter (Addendum)
Noted. Then recommend he at least complete iFOB to look for hidden blood in stool and schedule 3 month f/u to recheck weight.  May mail iFOB or they may come pick up.

## 2018-08-05 NOTE — Telephone Encounter (Deleted)
Noted. Then recommend he at least complete iFOB to look for hidden blood in stool and schedule 3 month f/u to recheck weight.  May mail iFOB or they may come pick up.

## 2018-08-05 NOTE — Telephone Encounter (Signed)
Left message on vm per dpr relaying Dr. Synthia Innocent message. Informed pt I am mailing iFOB stool kit and instructions. Asked pt to call back to schedule 3 mo wt chk f/u.

## 2018-08-21 DIAGNOSIS — N281 Cyst of kidney, acquired: Secondary | ICD-10-CM | POA: Diagnosis not present

## 2018-08-21 DIAGNOSIS — N201 Calculus of ureter: Secondary | ICD-10-CM | POA: Diagnosis not present

## 2018-08-22 ENCOUNTER — Other Ambulatory Visit (INDEPENDENT_AMBULATORY_CARE_PROVIDER_SITE_OTHER): Payer: Medicare Other

## 2018-08-22 DIAGNOSIS — Z1211 Encounter for screening for malignant neoplasm of colon: Secondary | ICD-10-CM

## 2018-08-22 LAB — FECAL OCCULT BLOOD, IMMUNOCHEMICAL: Fecal Occult Bld: NEGATIVE

## 2018-08-26 ENCOUNTER — Encounter: Payer: Self-pay | Admitting: *Deleted

## 2018-09-03 ENCOUNTER — Telehealth: Payer: Self-pay

## 2018-09-03 NOTE — Telephone Encounter (Signed)
Spoke with Mrs. Adrian Davis. Patient is following up with  [facility] about cyst and kidney stone that was seen on CT scan recently. Patient is trying to finish up farming season and will have surgery for the kidney stone and help their son with upcoming surgery, then maybe address GI referral then. Patient's wife states he is not getting sick on his stomach anymore at this time. Will let us know if anything changes.

## 2018-09-03 NOTE — Telephone Encounter (Signed)
Left message for patient to call back in regards to a referral   

## 2018-09-04 ENCOUNTER — Telehealth: Payer: Self-pay | Admitting: Cardiology

## 2018-09-04 DIAGNOSIS — E7800 Pure hypercholesterolemia, unspecified: Secondary | ICD-10-CM

## 2018-09-04 DIAGNOSIS — E538 Deficiency of other specified B group vitamins: Secondary | ICD-10-CM

## 2018-09-04 DIAGNOSIS — I10 Essential (primary) hypertension: Secondary | ICD-10-CM

## 2018-09-04 NOTE — Telephone Encounter (Signed)
Noted  

## 2018-09-04 NOTE — Telephone Encounter (Signed)
New Message          Patient is needing an lab order sent [facility] , patient to see Dr. Martinique on 09/09/2018. Pls call and advise

## 2018-09-04 NOTE — Telephone Encounter (Signed)
Returned call to patient's wife she stated husband going to have fasting labs tomorrow at [facility] PCP.He has follow up appointment with Dr.Jordan 09/09/18.Lab orders placed.

## 2018-09-05 DIAGNOSIS — E785 Hyperlipidemia, unspecified: Secondary | ICD-10-CM | POA: Diagnosis not present

## 2018-09-05 DIAGNOSIS — I10 Essential (primary) hypertension: Secondary | ICD-10-CM | POA: Diagnosis not present

## 2018-09-06 LAB — HEPATIC FUNCTION PANEL
ALT: 11 IU/L (ref 0–44)
AST: 12 IU/L (ref 0–40)
Albumin: 4.4 g/dL (ref 3.5–4.8)
Alkaline Phosphatase: 59 IU/L (ref 39–117)
Bilirubin Total: 0.3 mg/dL (ref 0.0–1.2)
Bilirubin, Direct: 0.08 mg/dL (ref 0.00–0.40)
Total Protein: 6.4 g/dL (ref 6.0–8.5)

## 2018-09-06 LAB — BASIC METABOLIC PANEL
BUN/Creatinine Ratio: 16 (ref 10–24)
BUN: 13 mg/dL (ref 8–27)
CO2: 23 mmol/L (ref 20–29)
Calcium: 9.4 mg/dL (ref 8.6–10.2)
Chloride: 106 mmol/L (ref 96–106)
Creatinine, Ser: 0.82 mg/dL (ref 0.76–1.27)
GFR calc Af Amer: 99 mL/min/{1.73_m2} (ref >59)
GFR calc non Af Amer: 86 mL/min/{1.73_m2} (ref >59)
Glucose: 96 mg/dL (ref 65–99)
Potassium: 4.5 mmol/L (ref 3.5–5.2)
Sodium: 144 mmol/L (ref 134–144)

## 2018-09-06 LAB — LIPID PANEL W/O CHOL/HDL RATIO
Cholesterol, Total: 142 mg/dL (ref 100–199)
HDL: 43 mg/dL (ref >39)
LDL Calculated: 76 mg/dL (ref 0–99)
Triglycerides: 113 mg/dL (ref 0–149)
VLDL Cholesterol Cal: 23 mg/dL (ref 5–40)

## 2018-09-06 NOTE — Progress Notes (Signed)
Adrian Davis Date of Birth: [birth year] Medical Record #[id number]  History of Present Illness: Adrian Davis is seen today for follow up of HTN. He has a history of HTN and HLD. No known CAD. He had a normal Adenosine Myoview back in 2008. His last echo was in January of 2012 showing grade 1 diastolic dysfunction, mild LVH and a normal EF.    On follow up today he states he is doing well. He denies any chest pain, dyspnea, edema, palpitations. He has experienced weight loss of 20 lbs in the last 6 months. He had dental extraction and now has dentures making eating more challenging. He gets full easily. He did have some abdominal pain. CT showed a renal stone and some cysts. He reports urology recommended surgery. Colonoscopy has also been recommended but he has deferred for now.  Feels energy level is good and still will work 14 hour days. He has a bad knee.  Current Outpatient Medications on File Prior to Visit  Medication Sig Dispense Refill  . acetaminophen (TYLENOL) 650 MG CR tablet Take 650 mg by mouth every 8 (eight) hours as needed for pain.    Marland Kitchen amLODipine (NORVASC) 2.5 MG tablet take 2 tablets by mouth every morning and 1 tablet IN THE AFTERNOON AS DIRECTED 270 tablet 1  . aspirin EC 81 MG tablet Take 81 mg by mouth daily.    . benazepril (LOTENSIN) 20 MG tablet Take 1 tablet (20 mg total) by mouth 2 (two) times daily. 60 tablet 5  . Cyanocobalamin (B-12) 1000 MCG SUBL Place 1 tablet under the tongue every other day. 30 each   . diclofenac sodium (VOLTAREN) 1 % GEL Apply 1 application topically 3 (three) times daily.    . DiphenhydrAMINE HCl ([facility] ALLERGY PO) Take 1 tablet by mouth at bedtime.    . docusate sodium (COLACE) 100 MG capsule Take 1 capsule (100 mg total) by mouth 2 (two) times daily. 180 capsule 3  . mirabegron ER (MYRBETRIQ) 25 MG TB24 tablet Take 1 tablet by mouth daily.    . naproxen sodium (ANAPROX) 220 MG tablet Take 440 mg by mouth daily.     . pantoprazole (PROTONIX) 40  MG tablet Take 40 mg by mouth daily.    . polyethylene glycol powder (GLYCOLAX/MIRALAX) powder Take 17 g by mouth daily. 3350 g 1  . potassium chloride (K-DUR) 10 MEQ tablet TAKE 2 TABLETS (20 MG) IN THE MORNING AND 1 TABLET (10 MG) IN THE EVENING. 270 tablet 1  . ranitidine (ZANTAC) 300 MG tablet Take 0.5 tablets (150 mg total) by mouth 2 (two) times daily. 90 tablet 1  . simvastatin (ZOCOR) 10 MG tablet Take 1 tablet (10 mg total) by mouth daily. 30 tablet 8  . tamsulosin (FLOMAX) 0.4 MG CAPS capsule Take 1 capsule (0.4 mg total) by mouth daily after supper.     No current facility-administered medications on file prior to visit.     No Known Allergies  Past Medical History:  Diagnosis Date  . Abnormal echocardiogram Jan 2012   grade 1 diastolic dysfunction, normal EF, mild LVH  . Bell palsy 10/2010   presented with facial numbness  . 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 Degenerative disk disease   . GERD (gastroesophageal reflux disease)   . Hyperlipidemia   . Hypertension    dr Lean Jaeger Martinique  . Lumbar stenosis   . Nephrolithiasis   . Normal nuclear stress test 2008  . Osteoarthritis   .  Skin cancer   . Vocal cord leukoplakia    Dr Erik Obey    Past Surgical History:  Procedure Laterality Date  . KNEE ARTHROSCOPY Right   . LUMBAR LAMINECTOMY/DECOMPRESSION MICRODISCECTOMY N/A 11/12/2013   Ophelia Charter, MD - complicated by urinary retention  . SHOULDER SURGERY Bilateral   . TONSILLECTOMY      Social History   Tobacco Use  Smoking Status Former Smoker  . Packs/day: 1.50  . Years: 10.00  . Pack years: 15.00  . Types: Cigarettes  . Last attempt to quit: 05/15/1976  . Years since quitting: 42.3  Smokeless Tobacco Current User  . Types: Chew    Social History   Substance and Sexual Activity  Alcohol Use Yes   Comment: occ    Family History  Problem Relation Age of Onset  . Heart failure Father 25  . Hypertension Father   . Arthritis  Father   . Cancer Neg Hx   . Diabetes Neg Hx     Review of Systems: The review of systems is per the HPI.  All other systems were reviewed and are negative.  Physical Exam: BP (!) 147/80   Pulse 73   Ht 5\' 8"  (1.727 m)   Wt 175 lb 12.8 oz (79.7 kg)   BMI 26.73 kg/m  GENERAL:  Well appearing WM in NAD HEENT:  PERRL, EOMI, sclera are clear. Oropharynx is clear. NECK:  No jugular venous distention, carotid upstroke brisk and symmetric, no bruits, no thyromegaly or adenopathy LUNGS:  Clear to auscultation bilaterally CHEST:  Unremarkable HEART:  RRR,  PMI not displaced or sustained,S1 and S2 within normal limits, no S3, no S4: no clicks, no rubs, no murmurs ABD:  Soft, nontender. BS +, no masses or bruits. No hepatomegaly, no splenomegaly EXT:  2 + pulses throughout, no edema, no cyanosis no clubbing SKIN:  Warm and dry.  No rashes NEURO:  Alert and oriented x 3. Cranial nerves II through XII intact. PSYCH:  Cognitively intact      Laboratory data: Lab Results  Component Value Date   WBC 5.6 07/24/2018   HGB 13.6 07/24/2018   HCT 41.3 07/24/2018   PLT 226.0 07/24/2018   GLUCOSE 96 09/05/2018   CHOL 142 09/05/2018   TRIG 113 09/05/2018   HDL 43 09/05/2018   LDLDIRECT 102.0 05/15/2011   LDLCALC 76 09/05/2018   ALT 11 09/05/2018   AST 12 09/05/2018   NA 144 09/05/2018   K 4.5 09/05/2018   CL 106 09/05/2018   CREATININE 0.82 09/05/2018   BUN 13 09/05/2018   CO2 23 09/05/2018   TSH 4.53 (H) 01/15/2018   PSA 1.27 01/15/2018   INR 1.01 11/12/2010   HGBA1C (H) 11/12/2010    5.7 (NOTE)                                                                       According to the ADA Clinical Practice Recommendations for 2011, when HbA1c is used as a screening test:   >=6.5%   Diagnostic of Diabetes Mellitus           (if abnormal result  is confirmed)  5.7-6.4%   Increased risk of developing Diabetes Mellitus  References:Diagnosis and Classification of Diabetes  Mellitus,Diabetes  SHUO,3729,02(XJDBZ 1):S62-S69 and Standards of Medical Care in         Diabetes - 2011,Diabetes MCEY,2233,61  (Suppl 1):S11-S61.    Assessment / Plan: 1. HTN mildly elevated today but has been well controlled. Continue amlodipine and lotensin. Only uses HCTZ prn for swelling.  2. Hyperlipidemia. Labs look good. Continue current therapy  3. Weight loss. Encouraged him to proceed with colonoscopy. Fortunately CT didn't show any CA.   Follow up in 6 months.

## 2018-09-09 ENCOUNTER — Other Ambulatory Visit: Payer: Self-pay

## 2018-09-09 ENCOUNTER — Ambulatory Visit (INDEPENDENT_AMBULATORY_CARE_PROVIDER_SITE_OTHER): Payer: Medicare Other | Admitting: Cardiology

## 2018-09-09 ENCOUNTER — Encounter: Payer: Self-pay | Admitting: Cardiology

## 2018-09-09 VITALS — BP 147/80 | HR 73 | Ht 68.0 in | Wt 175.8 lb

## 2018-09-09 DIAGNOSIS — E7800 Pure hypercholesterolemia, unspecified: Secondary | ICD-10-CM | POA: Diagnosis not present

## 2018-09-09 DIAGNOSIS — I10 Essential (primary) hypertension: Secondary | ICD-10-CM | POA: Diagnosis not present

## 2018-09-09 NOTE — Patient Instructions (Signed)
Continue your current therapy  I will see you in 6 months.   

## 2018-10-10 ENCOUNTER — Other Ambulatory Visit: Payer: Self-pay | Admitting: Family Medicine

## 2018-10-10 NOTE — Telephone Encounter (Signed)
Dr. Darnell Level, do you want to send an alternative for ranitidine considering the recall?

## 2018-10-11 ENCOUNTER — Other Ambulatory Visit: Payer: Self-pay

## 2018-10-11 MED ORDER — FAMOTIDINE 40 MG PO TABS: 20.0000 mg | ORAL_TABLET | Freq: Two times a day (BID) | ORAL | 1 refills | Status: DC

## 2018-10-11 MED ORDER — POTASSIUM CHLORIDE ER 10 MEQ PO TBCR: EXTENDED_RELEASE_TABLET | ORAL | 1 refills | Status: DC

## 2018-10-11 NOTE — Telephone Encounter (Signed)
Will send in pepcid due to zantac recall.

## 2018-10-14 ENCOUNTER — Other Ambulatory Visit: Payer: Self-pay | Admitting: *Deleted

## 2018-10-14 MED ORDER — POTASSIUM CHLORIDE ER 10 MEQ PO TBCR: EXTENDED_RELEASE_TABLET | ORAL | 1 refills | Status: DC

## 2018-10-29 ENCOUNTER — Encounter: Payer: Self-pay | Admitting: Family Medicine

## 2018-10-29 NOTE — Progress Notes (Signed)
BP 124/76 (BP Location: Left Arm, Patient Position: Sitting, Cuff Size: Normal)   Pulse 77   Temp 98.4 F (36.9 C) (Oral)   Ht 5\' 7"  (1.702 m)   Wt 175 lb (79.4 kg)   SpO2 98%   BMI 27.41 kg/m    CC: check sternum Subjective:    Patient ID: NAVID LENZEN, male    DOB: [birth year], 78 y.o.   MRN: [id number]  HPI: ESPN ZEMAN is a 78 y.o. male presenting on 10/30/2018 for Cyst (C/o a knot located on sternum. Noticed about 2 wks ago. Occasionally painful and changes sizes. Pt fell on 10/14/18 but does not remember injuring that area. Pt accompanined by his wife, Aram Beecham. )   Sternal knot noted 2 weeks ago. Today knot is smaller than yesterday. Occasionally tender. Not red or warmth.   Weight remains stable.   Had fall 2-3 wks ago and skinned head - tripped on metal pipe at home.  No new joint pains. Known osteoarthritis treats with aleve and tylenol daily.   Upcoming appt with urology Dr Jeffie Pollock for bilateral 38F bosniak renal cyst Bowels are more regular with current bowel regimen.     Relevant past medical, surgical, family and social history reviewed and updated as indicated. Interim medical history since our last visit reviewed. Allergies and medications reviewed and updated. Outpatient Medications Prior to Visit  Medication Sig Dispense Refill  . acetaminophen (TYLENOL) 650 MG CR tablet Take 650 mg by mouth every 8 (eight) hours as needed for pain.    Marland Kitchen amLODipine (NORVASC) 2.5 MG tablet take 2 tablets by mouth every morning and 1 tablet IN THE AFTERNOON AS DIRECTED 270 tablet 1  . aspirin EC 81 MG tablet Take 81 mg by mouth daily.    . benazepril (LOTENSIN) 20 MG tablet Take 1 tablet (20 mg total) by mouth 2 (two) times daily. 60 tablet 5  . Cyanocobalamin (B-12) 1000 MCG SUBL Place 1 tablet under the tongue every other day. 30 each   . diclofenac sodium (VOLTAREN) 1 % GEL Apply 1 application topically 3 (three) times daily.    . DiphenhydrAMINE HCl ([employer] ALLERGY PO) Take 1  tablet by mouth at bedtime.    . docusate sodium (COLACE) 100 MG capsule Take 1 capsule (100 mg total) by mouth 2 (two) times daily. 180 capsule 3  . mirabegron ER (MYRBETRIQ) 25 MG TB24 tablet Take 1 tablet by mouth daily.    . naproxen sodium (ANAPROX) 220 MG tablet Take 440 mg by mouth daily.     . polyethylene glycol powder (GLYCOLAX/MIRALAX) powder Take 17 g by mouth daily. 3350 g 1  . potassium chloride (K-DUR) 10 MEQ tablet TAKE 2 TABLETS (20 MG) IN THE MORNING AND 1 TABLET (10 MG) IN THE EVENING. 270 tablet 1  . ranitidine (ZANTAC) 300 MG tablet Take 0.5 tablets (150 mg total) by mouth 2 (two) times daily. 90 tablet 1  . simvastatin (ZOCOR) 10 MG tablet Take 1 tablet (10 mg total) by mouth daily. 30 tablet 8  . tamsulosin (FLOMAX) 0.4 MG CAPS capsule Take 1 capsule (0.4 mg total) by mouth daily after supper.    . pantoprazole (PROTONIX) 40 MG tablet Take 40 mg by mouth daily.    . famotidine (PEPCID) 40 MG tablet Take 0.5 tablets (20 mg total) by mouth 2 (two) times daily. 90 tablet 1   No facility-administered medications prior to visit.      Per HPI unless specifically indicated in ROS  section below Review of Systems Objective:    BP 124/76 (BP Location: Left Arm, Patient Position: Sitting, Cuff Size: Normal)   Pulse 77   Temp 98.4 F (36.9 C) (Oral)   Ht 5\' 7"  (1.702 m)   Wt 175 lb (79.4 kg)   SpO2 98%   BMI 27.41 kg/m   Wt Readings from Last 3 Encounters:  10/30/18 175 lb (79.4 kg)  09/09/18 175 lb 12.8 oz (79.7 kg)  07/24/18 177 lb 4 oz (80.4 kg)    Physical Exam Vitals signs and nursing note reviewed.  Constitutional:      General: He is not in acute distress.    Appearance: Normal appearance.  Cardiovascular:     Rate and Rhythm: Normal rate and regular rhythm.     Pulses: Normal pulses.     Heart sounds: Normal heart sounds. No murmur.  Pulmonary:     Effort: Pulmonary effort is normal. No respiratory distress.     Breath sounds: Normal breath sounds. No  wheezing, rhonchi or rales.     Comments: Prominent xyphoid process with small knot overlying xyphoid Musculoskeletal:     Right lower leg: No edema.     Left lower leg: No edema.  Neurological:     Mental Status: He is alert.  Psychiatric:        Mood and Affect: Mood normal.       Results for orders placed or performed in visit on 08/22/18  Fecal occult blood, imunochemical  Result Value Ref Range   Fecal Occult Bld Negative Negative   Lab Results  Component Value Date   CREATININE 0.82 09/05/2018   BUN 13 09/05/2018   NA 144 09/05/2018   K 4.5 09/05/2018   CL 106 09/05/2018   CO2 23 09/05/2018    Assessment & Plan:   Problem List Items Addressed This Visit    Xyphoidalgia - Primary    Prominent xyphoid process (anticipate protruding xiphoid) without significant pain but possible small nodule/cyst overlying area that could explain differences in size noted by patient. Will watch for now, update if growing or worsening, check xray to eval lower sternum today. Not consistent with inflammatory arthritic process.       Relevant Orders   DG Chest 2 View   GERD (gastroesophageal reflux disease)    Requests protonix refilled today      Relevant Medications   pantoprazole (PROTONIX) 40 MG tablet       Meds ordered this encounter  Medications  . pantoprazole (PROTONIX) 40 MG tablet    Sig: Take 1 tablet (40 mg total) by mouth daily.    Dispense:  90 tablet    Refill:  1   Orders Placed This Encounter  Procedures  . DG Chest 2 View    Standing Status:   Future    Number of Occurrences:   1    Standing Expiration Date:   12/29/2019    Order Specific Question:   Reason for Exam (SYMPTOM  OR DIAGNOSIS REQUIRED)    Answer:   xyphoid pain and swelling    Order Specific Question:   Preferred imaging location?    Answer:   [facility]    Order Specific Question:   Radiology Contrast Protocol - do NOT remove file path    Answer:    \\[email or web address]    Follow up plan: No follow-ups on file.  [name], MD

## 2018-10-30 ENCOUNTER — Ambulatory Visit (INDEPENDENT_AMBULATORY_CARE_PROVIDER_SITE_OTHER): Payer: Medicare Other | Admitting: Family Medicine

## 2018-10-30 ENCOUNTER — Encounter: Payer: Self-pay | Admitting: Family Medicine

## 2018-10-30 ENCOUNTER — Ambulatory Visit (INDEPENDENT_AMBULATORY_CARE_PROVIDER_SITE_OTHER)
Admission: RE | Admit: 2018-10-30 | Discharge: 2018-10-30 | Disposition: A | Discharge status: Home / Self Care | Payer: Medicare Other | Source: Ambulatory Visit | Attending: Family Medicine | Admitting: Family Medicine

## 2018-10-30 VITALS — BP 124/76 | HR 77 | Temp 98.4°F | Ht 67.0 in | Wt 175.0 lb

## 2018-10-30 DIAGNOSIS — R222 Localized swelling, mass and lump, trunk: Secondary | ICD-10-CM | POA: Diagnosis not present

## 2018-10-30 DIAGNOSIS — R0789 Other chest pain: Secondary | ICD-10-CM

## 2018-10-30 DIAGNOSIS — K219 Gastro-esophageal reflux disease without esophagitis: Secondary | ICD-10-CM | POA: Diagnosis not present

## 2018-10-30 DIAGNOSIS — R072 Precordial pain: Secondary | ICD-10-CM | POA: Diagnosis not present

## 2018-10-30 MED ORDER — PANTOPRAZOLE SODIUM 40 MG PO TBEC: 40.0000 mg | DELAYED_RELEASE_TABLET | Freq: Every day | ORAL | 1 refills | Status: DC

## 2018-10-30 NOTE — Assessment & Plan Note (Addendum)
Prominent xyphoid process (anticipate protruding xiphoid) without significant pain but possible small nodule/cyst overlying area that could explain differences in size noted by patient. Will watch for now, update if growing or worsening, check xray to eval lower sternum today. Not consistent with inflammatory arthritic process.

## 2018-10-30 NOTE — Assessment & Plan Note (Addendum)
Requests protonix refilled today

## 2018-10-30 NOTE — Patient Instructions (Signed)
We are feeling xyphoid process and possible cyst overlying it. Chest xray today.  Keep an eye on this, let me know if enlarging or changing.

## 2018-10-31 ENCOUNTER — Telehealth: Payer: Self-pay

## 2018-10-31 NOTE — Telephone Encounter (Addendum)
Spoke with pt's wife, Aram Beecham (on dpr), relaying result and message per Dr. Henriette Combs understanding.   Results plz notify chest xray returned ok - showing xyphoid process (the part of lower sternum we talked about) did protrude and this is what he is feeling. Radiologist took a look at CT scan done 07/2018 and he did comment that the xyphoid was also protruding out at that time - unchanged.

## 2018-11-04 DIAGNOSIS — R3915 Urgency of urination: Secondary | ICD-10-CM | POA: Diagnosis not present

## 2018-11-04 DIAGNOSIS — N201 Calculus of ureter: Secondary | ICD-10-CM | POA: Diagnosis not present

## 2018-11-04 DIAGNOSIS — N281 Cyst of kidney, acquired: Secondary | ICD-10-CM | POA: Diagnosis not present

## 2018-11-04 DIAGNOSIS — N200 Calculus of kidney: Secondary | ICD-10-CM | POA: Diagnosis not present

## 2018-11-04 DIAGNOSIS — N401 Enlarged prostate with lower urinary tract symptoms: Secondary | ICD-10-CM | POA: Diagnosis not present

## 2018-11-05 ENCOUNTER — Other Ambulatory Visit: Payer: Self-pay | Admitting: Urology

## 2018-11-05 DIAGNOSIS — Z77018 Contact with and (suspected) exposure to other hazardous metals: Secondary | ICD-10-CM

## 2018-11-05 DIAGNOSIS — N281 Cyst of kidney, acquired: Secondary | ICD-10-CM

## 2018-11-14 ENCOUNTER — Encounter: Payer: Self-pay | Admitting: Family Medicine

## 2018-11-14 DIAGNOSIS — N200 Calculus of kidney: Secondary | ICD-10-CM | POA: Insufficient documentation

## 2018-11-14 DIAGNOSIS — N281 Cyst of kidney, acquired: Secondary | ICD-10-CM | POA: Insufficient documentation

## 2018-12-06 ENCOUNTER — Other Ambulatory Visit: Payer: Self-pay | Admitting: Cardiology

## 2018-12-10 ENCOUNTER — Other Ambulatory Visit: Payer: Self-pay

## 2018-12-10 MED ORDER — AMLODIPINE BESYLATE 2.5 MG PO TABS: ORAL_TABLET | ORAL | 3 refills | Status: DC

## 2018-12-18 ENCOUNTER — Other Ambulatory Visit: Payer: Self-pay | Admitting: Family Medicine

## 2018-12-18 NOTE — Telephone Encounter (Signed)
Did you want to send an alternative for the Zantac?

## 2018-12-20 MED ORDER — FAMOTIDINE 20 MG PO TABS: 20.0000 mg | ORAL_TABLET | Freq: Two times a day (BID) | ORAL | 1 refills | Status: DC

## 2018-12-20 NOTE — Telephone Encounter (Signed)
Spoke with pt relaying Dr. G's message. Pt verbalizes understanding.  

## 2018-12-20 NOTE — Telephone Encounter (Signed)
plz notify I have sent in pepcid to take in place of zantac

## 2018-12-21 ENCOUNTER — Other Ambulatory Visit: Payer: Self-pay | Admitting: Cardiology

## 2018-12-27 ENCOUNTER — Other Ambulatory Visit: Payer: Self-pay

## 2018-12-27 DIAGNOSIS — E7800 Pure hypercholesterolemia, unspecified: Secondary | ICD-10-CM

## 2018-12-27 DIAGNOSIS — E538 Deficiency of other specified B group vitamins: Secondary | ICD-10-CM

## 2018-12-27 DIAGNOSIS — I10 Essential (primary) hypertension: Secondary | ICD-10-CM

## 2019-01-03 ENCOUNTER — Other Ambulatory Visit: Payer: Self-pay

## 2019-01-03 DIAGNOSIS — I10 Essential (primary) hypertension: Secondary | ICD-10-CM

## 2019-01-03 DIAGNOSIS — E538 Deficiency of other specified B group vitamins: Secondary | ICD-10-CM

## 2019-01-03 DIAGNOSIS — E7800 Pure hypercholesterolemia, unspecified: Secondary | ICD-10-CM

## 2019-01-22 ENCOUNTER — Other Ambulatory Visit: Payer: Medicare Other

## 2019-01-28 DIAGNOSIS — R3915 Urgency of urination: Secondary | ICD-10-CM | POA: Diagnosis not present

## 2019-01-28 DIAGNOSIS — N201 Calculus of ureter: Secondary | ICD-10-CM | POA: Diagnosis not present

## 2019-01-28 DIAGNOSIS — N281 Cyst of kidney, acquired: Secondary | ICD-10-CM | POA: Diagnosis not present

## 2019-01-31 ENCOUNTER — Ambulatory Visit
Admission: RE | Admit: 2019-01-31 | Discharge: 2019-01-31 | Disposition: A | Discharge status: Home / Self Care | Payer: Medicare Other | Source: Ambulatory Visit | Attending: Urology | Admitting: Urology

## 2019-01-31 ENCOUNTER — Other Ambulatory Visit: Payer: Self-pay

## 2019-01-31 DIAGNOSIS — Z77018 Contact with and (suspected) exposure to other hazardous metals: Secondary | ICD-10-CM | POA: Diagnosis not present

## 2019-01-31 DIAGNOSIS — N281 Cyst of kidney, acquired: Secondary | ICD-10-CM | POA: Diagnosis not present

## 2019-01-31 MED ORDER — GADOBENATE DIMEGLUMINE 529 MG/ML IV SOLN
16.0000 mL | Freq: Once | INTRAVENOUS | Status: AC | PRN
  Administered 2019-01-31: 16 mL via INTRAVENOUS

## 2019-03-06 DIAGNOSIS — I10 Essential (primary) hypertension: Secondary | ICD-10-CM | POA: Diagnosis not present

## 2019-03-06 DIAGNOSIS — E7800 Pure hypercholesterolemia, unspecified: Secondary | ICD-10-CM | POA: Diagnosis not present

## 2019-03-06 DIAGNOSIS — E538 Deficiency of other specified B group vitamins: Secondary | ICD-10-CM | POA: Diagnosis not present

## 2019-03-07 LAB — HEPATIC FUNCTION PANEL
ALT: 11 IU/L (ref 0–44)
AST: 9 IU/L (ref 0–40)
Albumin: 4.3 g/dL (ref 3.7–4.7)
Alkaline Phosphatase: 61 IU/L (ref 39–117)
Bilirubin Total: 0.4 mg/dL (ref 0.0–1.2)
Bilirubin, Direct: 0.12 mg/dL (ref 0.00–0.40)
Total Protein: 6.6 g/dL (ref 6.0–8.5)

## 2019-03-07 LAB — BASIC METABOLIC PANEL
BUN/Creatinine Ratio: 16 (ref 10–24)
BUN: 15 mg/dL (ref 8–27)
CO2: 20 mmol/L (ref 20–29)
Calcium: 9.7 mg/dL (ref 8.6–10.2)
Chloride: 105 mmol/L (ref 96–106)
Creatinine, Ser: 0.93 mg/dL (ref 0.76–1.27)
GFR calc Af Amer: 92 mL/min/{1.73_m2} (ref >59)
GFR calc non Af Amer: 79 mL/min/{1.73_m2} (ref >59)
Glucose: 97 mg/dL (ref 65–99)
Potassium: 4.3 mmol/L (ref 3.5–5.2)
Sodium: 139 mmol/L (ref 134–144)

## 2019-03-07 LAB — VITAMIN B12: Vitamin B-12: 917 pg/mL (ref 232–1245)

## 2019-03-07 LAB — LIPID PANEL
Chol/HDL Ratio: 3.4 ratio (ref 0.0–5.0)
Cholesterol, Total: 147 mg/dL (ref 100–199)
HDL: 43 mg/dL (ref >39)
LDL Calculated: 79 mg/dL (ref 0–99)
Triglycerides: 127 mg/dL (ref 0–149)
VLDL Cholesterol Cal: 25 mg/dL (ref 5–40)

## 2019-03-09 NOTE — Progress Notes (Signed)
Virtual Visit via Telephone Note   This visit type was conducted due to national recommendations for restrictions regarding the COVID-19 Pandemic (e.g. social distancing) in an effort to limit this patient's exposure and mitigate transmission in our community.  Due to his co-morbid illnesses, this patient is at least at moderate risk for complications without adequate follow up.  This format is felt to be most appropriate for this patient at this time.  The patient did not have access to video technology/had technical difficulties with video requiring transitioning to audio format only (telephone).  All issues noted in this document were discussed and addressed.  No physical exam could be performed with this format.  Please refer to the patient's chart for his  consent to telehealth for [facility].   Date:  03/12/2019   ID:  Adrian Davis, DOB 1940-12-19, MRN [id number]  Patient Location: Home Provider Location: Home  PCP:  [name], MD  Cardiologist: Ashby Moskal [place] MD Electrophysiologist:  None   Evaluation Performed:  Follow-Up Visit  Chief Complaint:  Follow up HTN  History of Present Illness:    Adrian Davis is a 78 y.o. male is seen today for follow up of HTN. He has a history of HTN and HLD. No known CAD. He had a normal Adenosine Myoview back in 2008. His last echo was in January of 2012 showing grade 1 diastolic dysfunction, mild LVH and a normal EF.    He has experienced weight loss this past year. He had dental extraction and now has dentures making eating more challenging. He does have some intermittent abdominal cramping.  CT showed a renal stone and some cysts. Repeat MRI in April showed no malignancy. Colonoscopy has deferred for now.  Feels energy level is good and is working long hours. He did have a vagal episode yesterday with abdominal cramping. He has been eating a lot of strawberries. His BP typically runs 245-809 systolic.   The patient does not have  symptoms concerning for COVID-19 infection (fever, chills, cough, or new shortness of breath).    Past Medical History:  Diagnosis Date  . Abnormal echocardiogram Jan 2012   grade 1 diastolic dysfunction, normal EF, mild LVH  . Bell palsy 10/2010   presented with facial numbness  . 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 Degenerative disk disease   . GERD (gastroesophageal reflux disease)   . Hyperlipidemia   . Hypertension    dr Rosmarie Esquibel [place]  . Lumbar stenosis   . Nephrolithiasis   . Normal nuclear stress test 2008  . Osteoarthritis   . Skin cancer   . Vocal cord leukoplakia    Dr Erik Obey   Past Surgical History:  Procedure Laterality Date  . KNEE ARTHROSCOPY Right   . LUMBAR LAMINECTOMY/DECOMPRESSION MICRODISCECTOMY N/A 11/12/2013   Ophelia Charter, MD - complicated by urinary retention  . SHOULDER SURGERY Bilateral   . TONSILLECTOMY       Current Meds  Medication Sig  . acetaminophen (TYLENOL) 650 MG CR tablet Take 650 mg by mouth every 8 (eight) hours as needed for pain.  Marland Kitchen amLODipine (NORVASC) 2.5 MG tablet take 2 tablets by mouth every morning and 1 tablet IN THE AFTERNOON AS DIRECTED  . aspirin EC 81 MG tablet Take 81 mg by mouth daily.  . benazepril (LOTENSIN) 20 MG tablet TAKE 1 TABLET(20 MG) BY MOUTH TWICE DAILY  . Cyanocobalamin (B-12) 1000 MCG SUBL Place 1 tablet under the tongue every other  day.  . diclofenac sodium (VOLTAREN) 1 % GEL Apply 1 application topically 3 (three) times daily.  . DiphenhydrAMINE HCl ([employer] ALLERGY PO) Take 1 tablet by mouth at bedtime.  . docusate sodium (COLACE) 100 MG capsule Take 1 capsule (100 mg total) by mouth 2 (two) times daily.  . famotidine (PEPCID) 20 MG tablet Take 1 tablet (20 mg total) by mouth 2 (two) times daily.  . mirabegron ER (MYRBETRIQ) 25 MG TB24 tablet Take 1 tablet by mouth daily.  . naproxen sodium (ANAPROX) 220 MG tablet Take 440 mg by mouth daily.   . pantoprazole (PROTONIX) 40 MG tablet  Take 1 tablet (40 mg total) by mouth daily.  . polyethylene glycol powder (GLYCOLAX/MIRALAX) powder Take 17 g by mouth daily.  . potassium chloride (K-DUR) 10 MEQ tablet Take 1 tablet (10 mEq total) by mouth daily. TAKE 2 TABLETS (20 MG) IN THE MORNING AND 1 TABLET (10 MG) IN THE EVENING.  . ranitidine (ZANTAC) 300 MG tablet Take 0.5 tablets (150 mg total) by mouth 2 (two) times daily.  . simvastatin (ZOCOR) 10 MG tablet TAKE 1 TABLET(10 MG) BY MOUTH DAILY  . tamsulosin (FLOMAX) 0.4 MG CAPS capsule Take 1 capsule (0.4 mg total) by mouth daily after supper.  . [DISCONTINUED] potassium chloride (K-DUR) 10 MEQ tablet TAKE 2 TABLETS (20 MG) IN THE MORNING AND 1 TABLET (10 MG) IN THE EVENING.     Allergies:   Patient has no known allergies.   Social History   Tobacco Use  . Smoking status: Former Smoker    Packs/day: 1.50    Years: 10.00    Pack years: 15.00    Types: Cigarettes    Last attempt to quit: 05/15/1976    Years since quitting: 42.8  . Smokeless tobacco: Current User    Types: Chew  Substance Use Topics  . Alcohol use: Yes    Comment: occ  . Drug use: No     Family Hx: The patient's family history includes Arthritis in his father; Heart failure (age of onset: 20) in his father; Hypertension in his father. There is no history of Cancer or Diabetes.  ROS:   Please see the history of present illness.     All other systems reviewed and are negative.   Prior CV studies:   The following studies were reviewed today:  none  Labs/Other Tests and Data Reviewed:    EKG:  No ECG reviewed.  Recent Labs: 07/24/2018: Hemoglobin 13.6; Platelets 226.0 03/06/2019: ALT 11; BUN 15; Creatinine, Ser 0.93; Potassium 4.3; Sodium 139   Recent Lipid Panel Lab Results  Component Value Date/Time   CHOL 147 03/06/2019 08:04 AM   TRIG 127 03/06/2019 08:04 AM   HDL 43 03/06/2019 08:04 AM   CHOLHDL 3.4 03/06/2019 08:04 AM   CHOLHDL 4 01/15/2018 08:49 AM   LDLCALC 79 03/06/2019 08:04 AM    LDLDIRECT 102.0 05/15/2011 08:09 AM    Wt Readings from Last 3 Encounters:  03/12/19 171 lb (77.6 kg)  10/30/18 175 lb (79.4 kg)  09/09/18 175 lb 12.8 oz (79.7 kg)     Objective:    Vital Signs:  BP (!) 146/76   Pulse 70   Ht 5\' 8"  (1.727 m)   Wt 171 lb (77.6 kg)   BMI 26.00 kg/m    VITAL SIGNS:  reviewed  ASSESSMENT & PLAN:    1. HTN generally well controlled.  Continue amlodipine and lotensin. Only uses HCTZ prn for swelling.  2. Hyperlipidemia. Labs  look good. Continue current therapy  3. Weight loss.   4. Vagal episode. Will try and reduce strawberry intake  COVID-19 Education: The signs and symptoms of COVID-19 were discussed with the patient and how to seek care for testing (follow up with PCP or arrange E-visit).  The importance of social distancing was discussed today.  Time:   Today, I have spent 15 minutes with the patient with telehealth technology discussing the above problems.     Medication Adjustments/Labs and Tests Ordered: Current medicines are reviewed at length with the patient today.  Concerns regarding medicines are outlined above.   Tests Ordered: No orders of the defined types were placed in this encounter.   Medication Changes: Meds ordered this encounter  Medications  . potassium chloride (K-DUR) 10 MEQ tablet    Sig: Take 1 tablet (10 mEq total) by mouth daily. TAKE 2 TABLETS (20 MG) IN THE MORNING AND 1 TABLET (10 MG) IN THE EVENING.    Dispense:  90 tablet    Refill:  1    Disposition:  Follow up in 6 month(s)  Signed, Zakyah Yanes [place], MD  03/12/2019 8:45 AM    [facility]

## 2019-03-11 ENCOUNTER — Telehealth: Payer: Self-pay | Admitting: Cardiology

## 2019-03-11 NOTE — Telephone Encounter (Signed)
Has flip phone/ declined my chart/ consent/pre reg completed

## 2019-03-12 ENCOUNTER — Telehealth (INDEPENDENT_AMBULATORY_CARE_PROVIDER_SITE_OTHER): Payer: Medicare Other | Admitting: Cardiology

## 2019-03-12 ENCOUNTER — Other Ambulatory Visit: Payer: Self-pay

## 2019-03-12 ENCOUNTER — Encounter: Payer: Self-pay | Admitting: Cardiology

## 2019-03-12 VITALS — BP 146/76 | HR 70 | Ht 68.0 in | Wt 171.0 lb

## 2019-03-12 DIAGNOSIS — I10 Essential (primary) hypertension: Secondary | ICD-10-CM | POA: Diagnosis not present

## 2019-03-12 DIAGNOSIS — E7800 Pure hypercholesterolemia, unspecified: Secondary | ICD-10-CM

## 2019-03-12 MED ORDER — POTASSIUM CHLORIDE ER 10 MEQ PO TBCR: 10.0000 meq | EXTENDED_RELEASE_TABLET | Freq: Every day | ORAL | 1 refills | Status: DC

## 2019-03-12 NOTE — Patient Instructions (Addendum)
Reduce potassium to one tablet a day  Continue your other therapy    Schedule follow up appointment in 6 months    Call 3 months before to schedule

## 2019-03-28 ENCOUNTER — Telehealth: Payer: Self-pay | Admitting: Cardiology

## 2019-03-28 NOTE — Telephone Encounter (Signed)
Looks like Dr Danise Mina check it in March 2019. It would be more appropriate for him to order.  [name] MD, [facility]

## 2019-03-28 NOTE — Telephone Encounter (Signed)
  Wife is calling because she wants a copy of the last lab work done on Mr Doyon and she wants to know if any labs were done to check his thyroid since it was discussed at last visit. She states Malachy Mood always mails out a couple of the blood work but she hasn't received it this time.

## 2019-03-28 NOTE — Telephone Encounter (Signed)
Returned call to patient's wife. Explained that TSH was not ordered on 03/06/19 labs. She reports patient has had continued weight loss for 1 year but is eating well. She states they discussed checking TSH with MD during last visit. She is requesting for MD to order TSH if he will, if not they will check with PCP.   Will routed to MD  Message sent to medical records to print copy of labs and mail per request

## 2019-03-31 NOTE — Telephone Encounter (Signed)
Wife updated and voiced understanding. 

## 2019-03-31 NOTE — Telephone Encounter (Signed)
Follow up  ° ° °Patient is returning call.  °

## 2019-04-01 ENCOUNTER — Ambulatory Visit (INDEPENDENT_AMBULATORY_CARE_PROVIDER_SITE_OTHER): Payer: Medicare Other | Admitting: Family Medicine

## 2019-04-01 ENCOUNTER — Encounter: Payer: Self-pay | Admitting: Family Medicine

## 2019-04-01 ENCOUNTER — Ambulatory Visit: Payer: Medicare Other | Admitting: Family Medicine

## 2019-04-01 ENCOUNTER — Other Ambulatory Visit (INDEPENDENT_AMBULATORY_CARE_PROVIDER_SITE_OTHER): Payer: Medicare Other

## 2019-04-01 VITALS — BP 133/79 | HR 85 | Temp 97.6°F | Ht 67.0 in | Wt 169.0 lb

## 2019-04-01 DIAGNOSIS — R3915 Urgency of urination: Secondary | ICD-10-CM

## 2019-04-01 DIAGNOSIS — N281 Cyst of kidney, acquired: Secondary | ICD-10-CM

## 2019-04-01 DIAGNOSIS — R634 Abnormal weight loss: Secondary | ICD-10-CM | POA: Diagnosis not present

## 2019-04-01 DIAGNOSIS — N401 Enlarged prostate with lower urinary tract symptoms: Secondary | ICD-10-CM

## 2019-04-01 LAB — CBC WITH DIFFERENTIAL/PLATELET
Basophils Absolute: 0 10*3/uL (ref 0.0–0.1)
Basophils Relative: 0.8 % (ref 0.0–3.0)
Eosinophils Absolute: 0.1 10*3/uL (ref 0.0–0.7)
Eosinophils Relative: 1.4 % (ref 0.0–5.0)
HCT: 41.8 % (ref 39.0–52.0)
Hemoglobin: 13.8 g/dL (ref 13.0–17.0)
Lymphocytes Relative: 31.5 % (ref 12.0–46.0)
Lymphs Abs: 1.7 10*3/uL (ref 0.7–4.0)
MCHC: 32.9 g/dL (ref 30.0–36.0)
MCV: 93.3 fl (ref 78.0–100.0)
Monocytes Absolute: 0.4 10*3/uL (ref 0.1–1.0)
Monocytes Relative: 8.3 % (ref 3.0–12.0)
Neutro Abs: 3.1 10*3/uL (ref 1.4–7.7)
Neutrophils Relative %: 58 % (ref 43.0–77.0)
Platelets: 227 10*3/uL (ref 150.0–400.0)
RBC: 4.49 Mil/uL (ref 4.22–5.81)
RDW: 13.6 % (ref 11.5–15.5)
WBC: 5.4 10*3/uL (ref 4.0–10.5)

## 2019-04-01 LAB — SEDIMENTATION RATE: Sed Rate: 4 mm/hr (ref 0–20)

## 2019-04-01 LAB — TSH: TSH: 2.44 u[IU]/mL (ref 0.35–4.50)

## 2019-04-01 LAB — T4, FREE: Free T4: 0.81 ng/dL (ref 0.60–1.60)

## 2019-04-01 LAB — PSA: PSA: 1.5 ng/mL (ref 0.10–4.00)

## 2019-04-01 NOTE — Assessment & Plan Note (Signed)
This is followed by urology.

## 2019-04-01 NOTE — Progress Notes (Signed)
[name] - 78 y.o. male  MRN [id number]  Date of Birth: [birth year]  PCP: [name], MD  This service was provided via telemedicine. Phone Visit performed on 04/01/2019    Rationale for phone visit along with limitations reviewed. Patient consented to telephone encounter.    Location of patient: home  Location of provider: in office, [place] [facility]  Name of referring provider: N/A   Names of persons and role in encounter: Provider: [name], MD  Patient: Adrian Davis  Other: N/A   Time on call: 8:13am - 8:34am   Subjective: Chief Complaint  Patient presents with  . Weight Loss    C/o decreased wt loss.     HPI:  20+ lb weight loss in the last 2 years.  iFOB negative 08/2018.  CT abd/pelvis with contrast 07/2018 stable (see below).   Feeling more fatigued than normal. Has noted ongoing drop in weight by a few pounds. Has tried to increase eating, appetite remains "fair". Decreased portions, but eating 5-6 times a day.   Has had 2 spells over the past 2 months (last 10 days ago) after large BM that leads to hypotension, pallor, diaphoresis, lower abd pain and malaise.   Having large bowel movements. H/o constipation.  Bowel regimen is docusate 100mg  twice daily.  Has been drinking ensure supplements.   Denies fevers/chills, night sweats, dyspnea, cough, chest pain, dizziness, headache, nausea/vomiting, dysphagia, [place] satiety, mouth pain.   Known bosniak 54F cysts followed by urology, latest MRI abd 01/2019 reviewed.   Recent cardiology labs reviewed.    Objective/Observations:   No physical exam or vital signs collected unless specifically identified below.   BP 133/79   Pulse 85   Temp 97.6 F (36.4 C) (Oral)   Ht 5\' 7"  (1.702 m)   Wt 169 lb (76.7 kg)   BMI 26.47 kg/m   Wt Readings from Last 3 Encounters:  04/01/19 169 lb (76.7 kg)  03/12/19 171 lb (77.6 kg)  10/30/18 175 lb (79.4 kg)    Respiratory status: speaks in  complete sentences without evident shortness of breath.   CT ABD PELVIS W/W/O CONTRAST IMPRESSION: 3 mm distal left ureteral calculus with mild ureterectasis, but no significant hydronephrosis. Probably benign bilateral Bosniak category 2 F renal cyst. Recommend continued imaging follow-up in 6 months, preferably with abdomen MRI without and with contrast. This recommendation follows ACR consensus guidelines: Management of the Incidental Renal Mass on CT: A White Paper of the ACR Incidental Findings Committee. J Am Coll Radiol [phone]. Mildly enlarged prostate. Electronically Signed   By: Earle Gell M.D.   On: 07/24/2018 18:45   Assessment/Plan:  Acquired complex renal cyst This is followed by urology.   Abnormal weight loss Ongoing mild loss - will update labs today. Discussed option of GI eval.  Will start with labs. Reviewed diet and ensure supplementation.  Over 20 minutes were spent face-to-face with the patient during this encounter and >50% of that time was spent on counseling and coordination of care   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 Update PSA.    I discussed the assessment and treatment plan with the patient. The patient was provided an opportunity to ask questions and all were answered. The patient agreed with the plan and demonstrated an understanding of the instructions.   Lab Orders     TSH     T4, free     CBC with Differential/Platelet     Sedimentation rate  PSA  No orders of the defined types were placed in this encounter.   The patient was advised to call back or seek an in-person evaluation if the symptoms worsen or if the condition fails to improve as anticipated.  [name], MD

## 2019-04-01 NOTE — Assessment & Plan Note (Addendum)
Ongoing mild loss - will update labs today. Discussed option of GI eval.  Will start with labs. Reviewed diet and ensure supplementation.  Over 20 minutes were spent face-to-face with the patient during this encounter and >50% of that time was spent on counseling and coordination of care

## 2019-04-01 NOTE — Assessment & Plan Note (Signed)
Update PSA 

## 2019-04-03 ENCOUNTER — Other Ambulatory Visit: Payer: Self-pay | Admitting: Family Medicine

## 2019-04-03 DIAGNOSIS — R634 Abnormal weight loss: Secondary | ICD-10-CM

## 2019-04-03 DIAGNOSIS — R1030 Lower abdominal pain, unspecified: Secondary | ICD-10-CM

## 2019-04-07 ENCOUNTER — Telehealth: Payer: Self-pay | Admitting: Family Medicine

## 2019-04-07 NOTE — Telephone Encounter (Signed)
Results mailed to patient;.

## 2019-04-07 NOTE — Telephone Encounter (Signed)
Patient's wife is requesting a copy of patient's recent lab work be mailed to him.

## 2019-04-08 DIAGNOSIS — C4491 Basal cell carcinoma of skin, unspecified: Secondary | ICD-10-CM

## 2019-04-08 DIAGNOSIS — Z85828 Personal history of other malignant neoplasm of skin: Secondary | ICD-10-CM | POA: Diagnosis not present

## 2019-04-08 DIAGNOSIS — D485 Neoplasm of uncertain behavior of skin: Secondary | ICD-10-CM | POA: Diagnosis not present

## 2019-04-08 DIAGNOSIS — C44311 Basal cell carcinoma of skin of nose: Secondary | ICD-10-CM | POA: Diagnosis not present

## 2019-04-08 DIAGNOSIS — L578 Other skin changes due to chronic exposure to nonionizing radiation: Secondary | ICD-10-CM | POA: Diagnosis not present

## 2019-04-08 HISTORY — DX: Basal cell carcinoma of skin, unspecified: C44.91

## 2019-04-14 ENCOUNTER — Telehealth: Payer: Self-pay

## 2019-04-14 NOTE — Telephone Encounter (Signed)
Covid-19 screening questions   Do you now or have you had a fever in the last 14 days? No  Do you have any respiratory symptoms of shortness of breath or cough now or in the last 14 days? No  Do you have any family members or close contacts with diagnosed or suspected Covid-19 in the past 14 days? No  Have you been tested for Covid-19 and found to be positive? No        

## 2019-04-15 ENCOUNTER — Encounter: Payer: Self-pay | Admitting: Nurse Practitioner

## 2019-04-15 ENCOUNTER — Telehealth: Payer: Self-pay

## 2019-04-15 ENCOUNTER — Ambulatory Visit (INDEPENDENT_AMBULATORY_CARE_PROVIDER_SITE_OTHER): Payer: Medicare Other | Admitting: Nurse Practitioner

## 2019-04-15 VITALS — BP 147/81 | Ht 68.0 in | Wt 165.0 lb

## 2019-04-15 DIAGNOSIS — R634 Abnormal weight loss: Secondary | ICD-10-CM

## 2019-04-15 DIAGNOSIS — K5909 Other constipation: Secondary | ICD-10-CM | POA: Diagnosis not present

## 2019-04-15 DIAGNOSIS — K219 Gastro-esophageal reflux disease without esophagitis: Secondary | ICD-10-CM | POA: Diagnosis not present

## 2019-04-15 DIAGNOSIS — R1030 Lower abdominal pain, unspecified: Secondary | ICD-10-CM

## 2019-04-15 MED ORDER — PEG 3350-KCL-NA BICARB-NACL 420 G PO SOLR: 4000.0000 mL | Freq: Once | ORAL | 0 refills | Status: AC

## 2019-04-15 NOTE — Progress Notes (Signed)
Attending Physician's Attestation   I have reviewed the chart.   I agree with the Advanced Practitioner's note, impression, and recommendations.   Justice Britain, MD [facility] Office # [phone]

## 2019-04-15 NOTE — Patient Instructions (Signed)
If you are age 77 or older, your body mass index should be between 23-30. Your Body mass index is 25.09 kg/m. If this is out of the aforementioned range listed, please consider follow up with your Primary Care Provider.  If you are age 63 or younger, your body mass index should be between 19-25. Your Body mass index is 25.09 kg/m. If this is out of the aformentioned range listed, please consider follow up with your Primary Care Provider.   You have been scheduled for an endoscopy and colonoscopy. Please follow the written instructions given to you at your visit today. Please pick up your prep supplies at the pharmacy within the next 1-3 days. If you use inhalers (even only as needed), please bring them with you on the day of your procedure. Your physician has requested that you go to www.startemmi.com and enter the access code given to you at your visit today. This web site gives a general overview about your procedure. However, you should still follow specific instructions given to you by our office regarding your preparation for the procedure.  We have sent the following medications to your pharmacy for you to pick up at your convenience: Golytely  Thank you for choosing me and [facility].   Tye Savoy, NP

## 2019-04-15 NOTE — Progress Notes (Signed)
Telephonic Visit in setting of [place]  Patient has given consent for a telephonic visit today and understands that is the same as is required for any face-to-face patient encounter except that the service was provided via telephone.   At the time of this telephonic visit the patient was located at home I, the Provider, at the office of [facility].   Patient was referred to [facility] by [name], MD  No one other than the patient, his wife and I participated in this telephonic visit today.   Total time of telephonic visit :  22 minutes.    ASSESSMENT / PLAN:   68. 78 yo male with significant weight loss and worsening of chronic constipation with lower abdominal pain.  CT scan of the abdomen October 2019 and MRI April of this year without any gastrointestinal findings -continue daily stool Miralax daily.  -For further recommendations patient will be scheduled for EGD and colonoscopy. The risks and benefits of EGD and colonoscopy with possible biopsies / polypectomy were discussed and the patient agrees to proceed.   2. GERD, symptoms controlled on am PPI / H2 blocker Q HS.   3. Hx of renal stones  4. Pancreatic divisum, asymptomatic   HPI:    Chief Complaint:   Weight loss , lower abdominal pain, talk about a colonoscopy.    Data Reviewed:  03/06/19 BMET normal.  04/01/19 CBC normal 08/23/19 FOBT  07/24/18  CTAP w/ contrast for severe lower abdominal pain, weight loss 01/31/28  MRI to evaluate renal stones -pancreatic divisum, normal-appearing gallbladder and liver.  Stomach, small bowel and large bowel unremarkable.  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  Patient has a history of 30 pound weight loss over the last 7 to 8 months which was at the time extensive dental work was completed.  He says nothing tastes good, his appetite is diminished and food just rolls around  in his mouth.  He has no dysphagia, no nausea or vomiting.  No upper abdominal discomfort.  In addition to above patient has had worsening of his chronic constipation.  He has been on a stool softener twice daily for 4 years but never really felt it worked.  Since October 2019 he has been having intermittent lower abdominal pain usually relieved with a bowel movement.  He has MiraLAX at home, tried it once and it did work well for him.  Patient has not had any rectal bleeding.  He has no family history of colon cancer.  Patient has never had a colonoscopy.  Patient has a history of GERD.  He takes Protonix in the morning and an H2 blocker at bedtime.  He was taking Zantac at bedtime until it was pulled off the market now on Pepcid at bedtime and this regimen works well for him.   Past Medical History:  Diagnosis Date  . Abnormal echocardiogram Jan 2012   grade 1 diastolic dysfunction, normal EF, mild LVH  . Bell palsy 10/2010   presented with facial numbness  . 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 Degenerative disk disease   . GERD (gastroesophageal reflux disease)   . Hyperlipidemia   . Hypertension    dr [name]  . Lumbar stenosis   . Nephrolithiasis   . Normal nuclear stress test 2008  . Osteoarthritis   . Skin cancer   . Vocal cord leukoplakia  Dr Erik Obey     Past Surgical History:  Procedure Laterality Date  . KNEE ARTHROSCOPY Right   . LUMBAR LAMINECTOMY/DECOMPRESSION MICRODISCECTOMY N/A 11/12/2013   Ophelia Charter, MD - complicated by urinary retention  . SHOULDER SURGERY Bilateral   . TONSILLECTOMY     Family History  Problem Relation Age of Onset  . Heart failure Father 40  . Hypertension Father   . Arthritis Father   . Anemia Mother        needed regular transfusions  . Prostate cancer Son   . Cancer Neg Hx   . Diabetes Neg Hx    Social History   Tobacco Use  . Smoking status: Former Smoker    Packs/day: 1.50    Years:  10.00    Pack years: 15.00    Types: Cigarettes    Quit date: 05/15/1976    Years since quitting: 42.9  . Smokeless tobacco: Current User    Types: Chew  . Tobacco comment: chews tobacco  Substance Use Topics  . Alcohol use: Yes    Comment: occ  . Drug use: No   Current Outpatient Medications  Medication Sig Dispense Refill  . acetaminophen (TYLENOL) 650 MG CR tablet Take 650 mg by mouth every 8 (eight) hours as needed for pain.    Marland Kitchen amLODipine (NORVASC) 2.5 MG tablet take 2 tablets by mouth every morning and 1 tablet IN THE AFTERNOON AS DIRECTED 270 tablet 3  . aspirin EC 81 MG tablet Take 81 mg by mouth daily.    . benazepril (LOTENSIN) 20 MG tablet TAKE 1 TABLET(20 MG) BY MOUTH TWICE DAILY 60 tablet 5  . Cyanocobalamin (B-12) 1000 MCG SUBL Place 1 tablet under the tongue every other day. (Patient taking differently: Place 1 tablet under the tongue as needed. ) 30 each   . diclofenac sodium (VOLTAREN) 1 % GEL Apply 1 application topically 3 (three) times daily.    Marland Kitchen docusate sodium (COLACE) 100 MG capsule Take 1 capsule (100 mg total) by mouth 2 (two) times daily. 180 capsule 3  . famotidine (PEPCID) 20 MG tablet Take 1 tablet (20 mg total) by mouth 2 (two) times daily. 180 tablet 1  . mirabegron ER (MYRBETRIQ) 25 MG TB24 tablet Take 1 tablet by mouth daily.    . mupirocin ointment (BACTROBAN) 2 % APPLY TO WOUND ONCE DAILY UNTIL HEALED AS DIRECTED    . naproxen sodium (ANAPROX) 220 MG tablet Take 440 mg by mouth daily.     . pantoprazole (PROTONIX) 40 MG tablet Take 1 tablet (40 mg total) by mouth daily. 90 tablet 1  . polyethylene glycol powder (GLYCOLAX/MIRALAX) powder Take 17 g by mouth daily. (Patient not taking: Reported on 04/14/2019) 3350 g 1  . potassium chloride (K-DUR) 10 MEQ tablet Take 1 tablet (10 mEq total) by mouth daily. TAKE 2 TABLETS (20 MG) IN THE MORNING AND 1 TABLET (10 MG) IN THE EVENING. (Patient taking differently: Take 10 mEq by mouth once. TAKE 1 TABLET (10 MG)  IN THE MORNING.) 90 tablet 1  . simvastatin (ZOCOR) 10 MG tablet TAKE 1 TABLET(10 MG) BY MOUTH DAILY 30 tablet 8  . tamsulosin (FLOMAX) 0.4 MG CAPS capsule Take 0.4 mg by mouth daily after supper. Pt has stopped on his own.     No current facility-administered medications for this visit.    No Known Allergies   Review of Systems: All systems reviewed and negative except where noted in HPI.   Creatinine clearance cannot be  calculated (Patient's most recent lab result is older than the maximum 21 days allowed.)   Physical Exam:    Wt Readings from Last 3 Encounters:  04/15/19 165 lb (74.8 kg)  04/01/19 169 lb (76.7 kg)  03/12/19 171 lb (77.6 kg)    BP (!) 147/81   Ht 5\' 8"  (1.727 m)   Wt 165 lb (74.8 kg)   BMI 25.09 kg/m   Physical exam: N/A  Tye Savoy, NP  04/15/2019, 8:48 AM  Cc: [name], MD

## 2019-04-15 NOTE — Telephone Encounter (Signed)
Spoke with patients wife.  She will call me when she receives instructions so I may go over them with her and her husband.  Acknowledgment form and instructions mailed to patient.  [name] , [place]

## 2019-04-16 ENCOUNTER — Other Ambulatory Visit: Payer: Self-pay

## 2019-04-16 MED ORDER — POTASSIUM CHLORIDE ER 10 MEQ PO TBCR: 10.0000 meq | EXTENDED_RELEASE_TABLET | Freq: Every day | ORAL | 1 refills | Status: DC

## 2019-04-23 HISTORY — PX: COLONOSCOPY: SHX174

## 2019-04-23 HISTORY — PX: ESOPHAGOGASTRODUODENOSCOPY: SHX1529

## 2019-04-24 ENCOUNTER — Other Ambulatory Visit: Payer: Self-pay | Admitting: Family Medicine

## 2019-04-24 ENCOUNTER — Other Ambulatory Visit: Payer: Self-pay

## 2019-04-24 MED ORDER — POTASSIUM CHLORIDE ER 10 MEQ PO TBCR: 10.0000 meq | EXTENDED_RELEASE_TABLET | Freq: Every day | ORAL | 1 refills | Status: DC

## 2019-05-02 DIAGNOSIS — M25562 Pain in left knee: Secondary | ICD-10-CM | POA: Diagnosis not present

## 2019-05-02 DIAGNOSIS — M1712 Unilateral primary osteoarthritis, left knee: Secondary | ICD-10-CM | POA: Diagnosis not present

## 2019-05-09 ENCOUNTER — Telehealth: Payer: Self-pay | Admitting: Nurse Practitioner

## 2019-05-09 NOTE — Telephone Encounter (Signed)
Patient wife called said that on the phone the nurse said that they did not need to get Laxative but on the paper they received by mail it states she does need it. She would like clarification. And also has other questions for the prep

## 2019-05-12 NOTE — Telephone Encounter (Signed)
Spoke with patient's wife in detail about prep instructions, which foods to eat and not eat.  Also, wanted to know if okay to take Miralax .  Patient was instructed again about which foods he shouldn't eat and okayed to take Miralax.  Paitents wife verbalized understanding.

## 2019-05-14 ENCOUNTER — Telehealth: Payer: Self-pay | Admitting: Gastroenterology

## 2019-05-14 NOTE — Telephone Encounter (Signed)

## 2019-05-15 ENCOUNTER — Other Ambulatory Visit: Payer: Self-pay

## 2019-05-15 ENCOUNTER — Ambulatory Visit (AMBULATORY_SURGERY_CENTER): Payer: Medicare Other | Admitting: Gastroenterology

## 2019-05-15 ENCOUNTER — Encounter: Payer: Self-pay | Admitting: Gastroenterology

## 2019-05-15 VITALS — BP 138/85 | HR 64 | Temp 98.6°F | Resp 19 | Ht 68.0 in | Wt 165.0 lb

## 2019-05-15 DIAGNOSIS — K2970 Gastritis, unspecified, without bleeding: Secondary | ICD-10-CM

## 2019-05-15 DIAGNOSIS — K648 Other hemorrhoids: Secondary | ICD-10-CM

## 2019-05-15 DIAGNOSIS — K5909 Other constipation: Secondary | ICD-10-CM

## 2019-05-15 DIAGNOSIS — R634 Abnormal weight loss: Secondary | ICD-10-CM | POA: Diagnosis not present

## 2019-05-15 DIAGNOSIS — D120 Benign neoplasm of cecum: Secondary | ICD-10-CM | POA: Diagnosis not present

## 2019-05-15 DIAGNOSIS — Z1211 Encounter for screening for malignant neoplasm of colon: Secondary | ICD-10-CM | POA: Diagnosis not present

## 2019-05-15 DIAGNOSIS — R109 Unspecified abdominal pain: Secondary | ICD-10-CM

## 2019-05-15 DIAGNOSIS — K2950 Unspecified chronic gastritis without bleeding: Secondary | ICD-10-CM | POA: Diagnosis not present

## 2019-05-15 MED ORDER — PANTOPRAZOLE SODIUM 40 MG PO TBEC: 40.0000 mg | DELAYED_RELEASE_TABLET | Freq: Two times a day (BID) | ORAL | 3 refills | Status: DC

## 2019-05-15 MED ORDER — SODIUM CHLORIDE 0.9 % IV SOLN: 500.0000 mL | Freq: Once | INTRAVENOUS | Status: DC

## 2019-05-15 NOTE — Op Note (Signed)
[place] Patient Name: Adrian Davis Procedure Date: 05/15/2019 2:15 PM MRN: [id number] Endoscopist: Justice Britain , MD Age: 78 Referring MD:  Date of Birth: 06-13-1941 Gender: Male Account #: [id number] Procedure:                Colonoscopy Indications:              Epigastric abdominal pain, Weight loss Medicines:                Monitored Anesthesia Care Procedure:                Pre-Anesthesia Assessment:                           - Prior to the procedure, a History and Physical                            was performed, and patient medications and                            allergies were reviewed. The patient's tolerance of                            previous anesthesia was also reviewed. The risks                            and benefits of the procedure and the sedation                            options and risks were discussed with the patient.                            All questions were answered, and informed consent                            was obtained. Prior Anticoagulants: The patient has                            taken no previous anticoagulant or antiplatelet                            agents except for aspirin. ASA Grade Assessment:                            III - A patient with severe systemic disease. After                            reviewing the risks and benefits, the patient was                            deemed in satisfactory condition to undergo the                            procedure.  After obtaining informed consent, the colonoscope                            was passed under direct vision. Throughout the                            procedure, the patient's blood pressure, pulse, and                            oxygen saturations were monitored continuously. The                            Colonoscope was introduced through the anus and                            advanced to the 4 cm into the ileum. The                             colonoscopy was somewhat difficult due to a                            redundant colon and significant looping. Successful                            completion of the procedure was aided by changing                            the patient's position, using manual pressure,                            withdrawing and reinserting the scope,                            straightening and shortening the scope to obtain                            bowel loop reduction and using scope torsion. The                            patient tolerated the procedure. The quality of the                            bowel preparation was good. The terminal ileum,                            ileocecal valve, appendiceal orifice, and rectum                            were photographed. Scope In: 2:41:19 PM Scope Out: 3:07:18 PM Scope Withdrawal Time: 0 hours 9 minutes 56 seconds  Total Procedure Duration: 0 hours 25 minutes 59 seconds  Findings:                 The digital rectal exam findings include  hemorrhoids. Pertinent negatives include no                            palpable rectal lesions.                           The terminal ileum and ileocecal valve appeared                            normal.                           Two sessile polyps were found in the cecum. The                            polyps were 1 to 2 mm in size. These polyps were                            removed with a cold snare. Resection and retrieval                            were complete.                           Normal mucosa was found in the entire colon                            otherwise.                           Non-bleeding non-thrombosed external and internal                            hemorrhoids were found during retroflexion, during                            perianal exam and during digital exam. The                            hemorrhoids were Grade I (internal hemorrhoids that                             do not prolapse). Complications:            No immediate complications. Estimated Blood Loss:     Estimated blood loss was minimal. Impression:               - Hemorrhoids found on digital rectal exam.                           - The examined portion of the ileum was normal.                           - Two 1 to 2 mm polyps in the cecum, removed with a  cold snare. Resected and retrieved.                           - Otherwise, normal mucosa in the entire examined                            colon.                           - Non-bleeding non-thrombosed external and internal                            hemorrhoids. Recommendation:           - The patient will be observed post-procedure,                            until all discharge criteria are met.                           - Discharge patient to home.                           - Patient has a contact number available for                            emergencies. The signs and symptoms of potential                            delayed complications were discussed with the                            patient. Return to normal activities tomorrow.                            Written discharge instructions were provided to the                            patient.                           - Resume previous diet.                           - Continue present medications.                           - Await pathology results.                           - Repeat colonoscopy for surveillance based on                            pathology results.                           - May need to consider if weight loss continues  CT-CAP for further workup/management.                           - The findings and recommendations were discussed                            with the patient. Justice Britain, MD 05/15/2019 3:27:30 PM

## 2019-05-15 NOTE — Progress Notes (Signed)
Pt's states no medical or surgical changes since previsit or office visit. 

## 2019-05-15 NOTE — Progress Notes (Signed)
Riki Sheer took temp and [place] took vitals.

## 2019-05-15 NOTE — Patient Instructions (Signed)
Handout on polyps and gastritis given.  Discontinue Pepcid. Increase Protonix to 40mg  twice daily. Prescription sent into pharmacy.   YOU HAD AN ENDOSCOPIC PROCEDURE TODAY AT [facility]:   Refer to the procedure report that was given to you for any specific questions about what was found during the examination.  If the procedure report does not answer your questions, please call your gastroenterologist to clarify.  If you requested that your care partner not be given the details of your procedure findings, then the procedure report has been included in a sealed envelope for you to review at your convenience later.  YOU SHOULD EXPECT: Some feelings of bloating in the abdomen. Passage of more gas than usual.  Walking can help get rid of the air that was put into your GI tract during the procedure and reduce the bloating. If you had a lower endoscopy (such as a colonoscopy or flexible sigmoidoscopy) you may notice spotting of blood in your stool or on the toilet paper. If you underwent a bowel prep for your procedure, you may not have a normal bowel movement for a few days.  Please Note:  You might notice some irritation and congestion in your nose or some drainage.  This is from the oxygen used during your procedure.  There is no need for concern and it should clear up in a day or so.  SYMPTOMS TO REPORT IMMEDIATELY:   Following lower endoscopy (colonoscopy or flexible sigmoidoscopy):  Excessive amounts of blood in the stool  Significant tenderness or worsening of abdominal pains  Swelling of the abdomen that is new, acute  Fever of 100F or higher   Following upper endoscopy (EGD)  Vomiting of blood or coffee ground material  New chest pain or pain under the shoulder blades  Painful or persistently difficult swallowing  New shortness of breath  Fever of 100F or higher  Black, tarry-looking stools  For urgent or emergent issues, a gastroenterologist can be reached at any  hour by calling [phone].   DIET:  We do recommend a small meal at first, but then you may proceed to your regular diet.  Drink plenty of fluids but you should avoid alcoholic beverages for 24 hours.  ACTIVITY:  You should plan to take it easy for the rest of today and you should NOT DRIVE or use heavy machinery until tomorrow (because of the sedation medicines used during the test).    FOLLOW UP: Our staff will call the number listed on your records 48-72 hours following your procedure to check on you and address any questions or concerns that you may have regarding the information given to you following your procedure. If we do not reach you, we will leave a message.  We will attempt to reach you two times.  During this call, we will ask if you have developed any symptoms of COVID 19. If you develop any symptoms (ie: fever, flu-like symptoms, shortness of breath, cough etc.) before then, please call [phone].  If you test positive for Covid 19 in the 2 weeks post procedure, please call and report this information to [place].    If any biopsies were taken you will be contacted by phone or by letter within the next 1-3 weeks.  Please call us at [phone] if you have not heard about the biopsies in 3 weeks.    SIGNATURES/CONFIDENTIALITY: You and/or your care partner have signed paperwork which will be entered into your electronic medical record.  These signatures attest to the fact that that the information above on your After Visit Summary has been reviewed and is understood.  Full responsibility of the confidentiality of this discharge information lies with you and/or your care-partner.

## 2019-05-15 NOTE — Op Note (Signed)
[place] Patient Name: Adrian Davis Procedure Date: 05/15/2019 2:15 PM MRN: [id number] Endoscopist: Justice Britain , MD Age: 78 Referring MD:  Date of Birth: 08-11-1941 Gender: Male Account #: [id number] Procedure:                Upper GI endoscopy Indications:              Epigastric abdominal pain, [place] satiety, Weight                            loss Medicines:                Monitored Anesthesia Care Procedure:                Pre-Anesthesia Assessment:                           - Prior to the procedure, a History and Physical                            was performed, and patient medications and                            allergies were reviewed. The patient's tolerance of                            previous anesthesia was also reviewed. The risks                            and benefits of the procedure and the sedation                            options and risks were discussed with the patient.                            All questions were answered, and informed consent                            was obtained. Prior Anticoagulants: The patient has                            taken no previous anticoagulant or antiplatelet                            agents except for aspirin. ASA Grade Assessment:                            III - A patient with severe systemic disease. After                            reviewing the risks and benefits, the patient was                            deemed in satisfactory condition to undergo the  procedure.                           After obtaining informed consent, the endoscope was                            passed under direct vision. Throughout the                            procedure, the patient's blood pressure, pulse, and                            oxygen saturations were monitored continuously. The                            Model GIF-[id number]) scope was introduced   through the mouth, and advanced to the second part                            of duodenum. The upper GI endoscopy was                            accomplished without difficulty. The patient                            tolerated the procedure. Scope In: Scope Out: Findings:                 The proximal esophagus and mid esophagus were                            normal.                           Two islands of salmon-colored mucosa were present                            at 39 cm. No other visible abnormalities were                            present. The maximum longitudinal extent of these                            esophageal mucosal changes was 1 cm in length.                            Biopsies were taken with a cold forceps for                            histology to rule out Barrett's.                           The Z-line was irregular and was found 40 cm from                            the incisors.  A widely patent and non-obstructing Schatzki ring                            was found at the gastroesophageal junction.                           Striped mildly erythematous mucosa without bleeding                            was found in the gastric antrum.                           A few dispersed, diminutive non-bleeding erosions                            were found in the prepyloric region of the stomach.                           No other gross lesions were noted in the entire                            examined stomach. Biopsies were taken with a cold                            forceps for histology and Helicobacter pylori                            testing.                           No gross lesions were noted in the duodenal bulb,                            in the first portion of the duodenum and in the                            second portion of the duodenum. Biopsies were taken                            with a cold forceps for histology to rule out                             enteropathy. Complications:            No immediate complications. Estimated Blood Loss:     Estimated blood loss was minimal. Impression:               - Normal proximal esophagus and mid esophagus.                           - Salmon-colored mucosa suspicious for                            short-segment Barrett's esophagus. Biopsied to rule  out Barrett's..                           - Z-line irregular, 40 cm from the incisors.                           - Widely patent and non-obstructing Schatzki ring.                           - Erythematous mucosa in the antrum. Non-bleeding                            erosive gastropathy. Biopsied for HP.                           - No gross lesions in the duodenal bulb, in the                            first portion of the duodenum and in the second                            portion of the duodenum. Biopsied. Recommendation:           - Proceed to scheduled colonoscopy.                           - Observe patient's clinical course.                           - Await pathology results.                           - Increase PPI to 40 mg BID and stop Pepcid. Rx to                            be sent to pharmacy (3-refills). Will want to see                            back in clinic to discuss ability to decrease PPI                            dosing based on symptoms.                           - The findings and recommendations were discussed                            with the patient. Justice Britain, MD 05/15/2019 3:23:04 PM

## 2019-05-15 NOTE — Progress Notes (Signed)
A and O x3. Report to RN. Tolerated MAC anesthesia well.GUMs unchanged after procedure.

## 2019-05-15 NOTE — Progress Notes (Signed)
Called to room to assist during endoscopic procedure.  Patient ID and intended procedure confirmed with present staff. Received instructions for my participation in the procedure from the performing physician.  

## 2019-05-19 ENCOUNTER — Telehealth: Payer: Self-pay

## 2019-05-19 MED ORDER — POLYETHYLENE GLYCOL 3350 17 G PO PACK: 17.0000 g | PACK | Freq: Two times a day (BID) | ORAL | 2 refills | Status: DC

## 2019-05-19 NOTE — Telephone Encounter (Signed)
appt made with Dr Rush Landmark on 8/25 at 66 am the pt wife also asked if I could send miralax in as a prescription.  I have done so but I did tell the pt that I got a warning that it is not covered. She will decide when going to the pharmacy if she wants to pick it up or get OTC

## 2019-05-19 NOTE — Telephone Encounter (Signed)
I called and spoke with the patient's wife this morning. Patient has no abdominal pain or discomfort. He is gone back to working as a former. However, the wife is remains concerne as she did not have an opportunity to actually talk with me after her husband's procedure. We spent 10 minutes going over the EGD and colonoscopy results this morning. He continues to have issues of constipation. They have picked up his acid reducing medication which we have prescribed after the procedure and he is taking that. He is minimizing his NSAID use at this time. I have asked him to continue trying to do that and try and use Tylenol, however, the patient describes not really having improvement with Tylenol and may need to have another pain medication for his arthritis issues in the future. In regards to his weight loss issues we will consider the role of a CT chest abdomen pelvis, however he has had a recent MR abdomen and had a CT of the abdomen/pelvis within the last 9 months. He may require CT chest as part of the round work-up for weight loss unintentionally. Regards to his constipation I would like the patient to be on MiraLAX 2 times daily, I would like the patient to also be on Colace 100 mg twice daily, I would like the patient to also take a dose of bisacodyl 10 mg if no bowel movement after 2 days. We can try other medications in the future if necessary. Patty, please go over the recommendations for the bowel regimen as I have already spoken with his wife about our plan. Please get a follow-up in clinic with the PAs or with myself in approximately 3 to 4 weeks. We are still awaiting biopsies.   Justice Britain, MD [facility] Office # [phone]

## 2019-05-19 NOTE — Telephone Encounter (Signed)
  Follow up Call-  Call back number 05/15/2019  Post procedure Call Back phone  # [phone] - home phone  Permission to leave phone message Yes  Some recent data might be hidden     Patient questions:  Do you have a fever, pain , or abdominal swelling? No. Pain Score  0 *  Have you tolerated food without any problems? Yes.    Have you been able to return to your normal activities? Yes.    Do you have any questions about your discharge instructions: Diet   No. Medications  No. Follow up visit  Yes.    Do you have questions or concerns about your Care? Yes.   WIFE VERY CONCERNED ABOUT PT'S WEIGHT LOSS AND CONSTIPATION.  WANTS TO TALK TO MD.  RN WILL FORWARD NOTE.   Actions: * If pain score is 4 or above: No action needed, pain <4.  1. Have you developed a fever since your procedure? no  2.   Have you had an respiratory symptoms (SOB or cough) since your procedure? no  3.   Have you tested positive for COVID 19 since your procedure no  4.   Have you had any family members/close contacts diagnosed with the COVID 19 since your procedure?  no   If yes to any of these questions please route to Joylene John, RN and Alphonsa Gin, Therapist, sports.

## 2019-05-20 ENCOUNTER — Encounter: Payer: Self-pay | Admitting: Gastroenterology

## 2019-05-20 DIAGNOSIS — C44311 Basal cell carcinoma of skin of nose: Secondary | ICD-10-CM | POA: Diagnosis not present

## 2019-05-20 DIAGNOSIS — L578 Other skin changes due to chronic exposure to nonionizing radiation: Secondary | ICD-10-CM | POA: Diagnosis not present

## 2019-05-29 ENCOUNTER — Other Ambulatory Visit: Payer: Self-pay

## 2019-05-29 ENCOUNTER — Other Ambulatory Visit: Payer: Self-pay | Admitting: Cardiology

## 2019-05-30 MED ORDER — DOCUSATE SODIUM 100 MG PO CAPS: 100.0000 mg | ORAL_CAPSULE | Freq: Two times a day (BID) | ORAL | 3 refills | Status: DC

## 2019-06-09 ENCOUNTER — Telehealth: Payer: Self-pay | Admitting: Gastroenterology

## 2019-06-09 NOTE — Telephone Encounter (Signed)
Pt requested prescription for Miralax bottle not the packets.  Pt also requested a call back to discuss med management of pantoprazole.

## 2019-06-10 ENCOUNTER — Telehealth: Payer: Self-pay | Admitting: Gastroenterology

## 2019-06-10 MED ORDER — POLYETHYLENE GLYCOL 3350 17 GM/SCOOP PO POWD: 1.0000 | Freq: Two times a day (BID) | ORAL | 3 refills | Status: DC

## 2019-06-10 NOTE — Telephone Encounter (Signed)
Returned pt's call. Pt requested rx for Miralax bottle vs packets. I have sent Rx for bottle of miralax. I was unable to reach pt. I did leave a message with this information and ask for a return call to discuss mgt of pantoprazole.

## 2019-06-10 NOTE — Telephone Encounter (Signed)
Returned call to pharmacy- clarification given.

## 2019-06-10 NOTE — Telephone Encounter (Signed)
Pt returned your call.  

## 2019-06-10 NOTE — Telephone Encounter (Signed)
Returned call to pt- spoke with wife all questions answered.

## 2019-06-12 ENCOUNTER — Ambulatory Visit: Payer: Medicare Other | Admitting: Gastroenterology

## 2019-06-16 ENCOUNTER — Other Ambulatory Visit: Payer: Self-pay | Admitting: Family Medicine

## 2019-06-17 ENCOUNTER — Ambulatory Visit: Payer: Medicare Other | Admitting: Gastroenterology

## 2019-06-23 ENCOUNTER — Encounter: Payer: Self-pay | Admitting: Family Medicine

## 2019-08-21 ENCOUNTER — Telehealth: Payer: Self-pay | Admitting: Family Medicine

## 2019-08-21 DIAGNOSIS — Z23 Encounter for immunization: Secondary | ICD-10-CM | POA: Diagnosis not present

## 2019-08-21 NOTE — Telephone Encounter (Signed)
Patient's wife called. Patient had his high dose flu shot at [place] by [employer].

## 2019-08-21 NOTE — Telephone Encounter (Signed)
Updated pt's immunizations 

## 2019-09-22 ENCOUNTER — Other Ambulatory Visit: Payer: Self-pay | Admitting: Cardiology

## 2019-09-22 NOTE — Telephone Encounter (Signed)
Rx request sent to pharmacy.  

## 2019-09-23 ENCOUNTER — Other Ambulatory Visit: Payer: Self-pay

## 2019-09-23 DIAGNOSIS — I10 Essential (primary) hypertension: Secondary | ICD-10-CM

## 2019-09-23 DIAGNOSIS — E7800 Pure hypercholesterolemia, unspecified: Secondary | ICD-10-CM

## 2019-09-26 DIAGNOSIS — M1712 Unilateral primary osteoarthritis, left knee: Secondary | ICD-10-CM | POA: Diagnosis not present

## 2019-09-26 DIAGNOSIS — M25562 Pain in left knee: Secondary | ICD-10-CM | POA: Diagnosis not present

## 2019-10-02 DIAGNOSIS — E7800 Pure hypercholesterolemia, unspecified: Secondary | ICD-10-CM | POA: Diagnosis not present

## 2019-10-02 DIAGNOSIS — I10 Essential (primary) hypertension: Secondary | ICD-10-CM | POA: Diagnosis not present

## 2019-10-03 LAB — LIPID PANEL
Chol/HDL Ratio: 3.6 ratio (ref 0.0–5.0)
Cholesterol, Total: 135 mg/dL (ref 100–199)
HDL: 38 mg/dL — ABNORMAL LOW (ref >39)
LDL Chol Calc (NIH): 64 mg/dL (ref 0–99)
Triglycerides: 197 mg/dL — ABNORMAL HIGH (ref 0–149)
VLDL Cholesterol Cal: 33 mg/dL (ref 5–40)

## 2019-10-03 LAB — HEPATIC FUNCTION PANEL
ALT: 10 IU/L (ref 0–44)
AST: 9 IU/L (ref 0–40)
Albumin: 4.3 g/dL (ref 3.7–4.7)
Alkaline Phosphatase: 64 IU/L (ref 39–117)
Bilirubin Total: 0.3 mg/dL (ref 0.0–1.2)
Bilirubin, Direct: 0.09 mg/dL (ref 0.00–0.40)
Total Protein: 6.2 g/dL (ref 6.0–8.5)

## 2019-10-03 LAB — BASIC METABOLIC PANEL
BUN/Creatinine Ratio: 16 (ref 10–24)
BUN: 15 mg/dL (ref 8–27)
CO2: 25 mmol/L (ref 20–29)
Calcium: 9.5 mg/dL (ref 8.6–10.2)
Chloride: 104 mmol/L (ref 96–106)
Creatinine, Ser: 0.93 mg/dL (ref 0.76–1.27)
GFR calc Af Amer: 91 mL/min/{1.73_m2} (ref >59)
GFR calc non Af Amer: 79 mL/min/{1.73_m2} (ref >59)
Glucose: 96 mg/dL (ref 65–99)
Potassium: 4.8 mmol/L (ref 3.5–5.2)
Sodium: 141 mmol/L (ref 134–144)

## 2019-10-06 NOTE — Progress Notes (Signed)
Virtual Visit via Telephone Note   This visit type was conducted due to national recommendations for restrictions regarding the COVID-19 Pandemic (e.g. social distancing) in an effort to limit this patient's exposure and mitigate transmission in our community.  Due to his co-morbid illnesses, this patient is at least at moderate risk for complications without adequate follow up.  This format is felt to be most appropriate for this patient at this time.  The patient did not have access to video technology/had technical difficulties with video requiring transitioning to audio format only (telephone).  All issues noted in this document were discussed and addressed.  No physical exam could be performed with this format.  Please refer to the patient's chart for his  consent to telehealth for [facility].   Date:  10/08/2019   ID:  Adrian Davis, DOB 11-21-1940, MRN [id number]  Patient Location: Home Provider Location: Home  PCP:  Adrian Bush, MD  Cardiologist: Adrian Powley Martinique MD Electrophysiologist:  None   Evaluation Performed:  Follow-Up Visit  Chief Complaint:  Follow up HTN  History of Present Illness:    Adrian Davis is a 78 y.o. male is seen today for follow up of HTN. He has a history of HTN and HLD. No known CAD. He had a normal Adenosine Myoview back in 2008. His last echo was in January of 2012 showing grade 1 diastolic dysfunction, mild LVH and a normal EF.    He has experienced weight loss this past year. He had dental extraction and now has dentures making eating more challenging. He does have some intermittent abdominal cramping.  CT showed a renal stone and some cysts. Repeat MRI in April showed no malignancy. Colonoscopy and EGD were done in July with benign findings.  Feels energy level is good and is working long hours.  His BP typically runs [id number] systolic and 75 diastolic. He states his weight has stabilized. He is eating everything he can get his hands on including  moon pies and sodas.   The patient does not have symptoms concerning for COVID-19 infection (fever, chills, cough, or new shortness of breath).    Past Medical History:  Diagnosis Date   Abnormal echocardiogram Jan 2012   grade 1 diastolic dysfunction, normal EF, mild LVH   Bell palsy 10/2010   presented with facial numbness   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    Degenerative disk disease    GERD (gastroesophageal reflux disease)    Hyperlipidemia    Hypertension    dr Adrian Davis   Lumbar stenosis    Nephrolithiasis    Normal nuclear stress test 2008   Osteoarthritis    Skin cancer    Vocal cord leukoplakia    Dr Adrian Davis   Past Surgical History:  Procedure Laterality Date   COLONOSCOPY  04/2019   TAx2, hemorrhoids, rpt 37yrs (Adrian Davis)   ESOPHAGOGASTRODUODENOSCOPY  04/2019   erosive gastropathy, biopsy negative for barrett's (Adrian Davis)   KNEE ARTHROSCOPY Right    LUMBAR LAMINECTOMY/DECOMPRESSION MICRODISCECTOMY N/A 11/12/2013   Adrian Charter, MD - complicated by urinary retention   SHOULDER SURGERY Bilateral    TONSILLECTOMY       Current Meds  Medication Sig   acetaminophen (TYLENOL) 650 MG CR tablet Take 650 mg by mouth every 8 (eight) hours as needed for pain.   amLODipine (NORVASC) 2.5 MG tablet take 2 tablets by mouth every morning and 1 tablet IN THE AFTERNOON AS DIRECTED   aspirin EC 81  MG tablet Take 81 mg by mouth daily.   benazepril (LOTENSIN) 20 MG tablet TAKE 1 TABLET(20 MG) BY MOUTH TWICE DAILY   Cyanocobalamin (B-12) 1000 MCG SUBL Place 1 tablet under the tongue every other day.   diclofenac Sodium (VOLTAREN) 1 % GEL Apply topically daily as needed.   docusate sodium (COLACE) 100 MG capsule Take 1 capsule (100 mg total) by mouth 2 (two) times daily.   mupirocin ointment (BACTROBAN) 2 % APPLY TO WOUND ONCE DAILY UNTIL HEALED AS DIRECTED   naproxen sodium (ANAPROX) 220 MG tablet Take 440 mg by mouth  daily.    pantoprazole (PROTONIX) 40 MG tablet Take 40 mg by mouth 2 (two) times daily.   polyethylene glycol powder (GLYCOLAX/MIRALAX) 17 GM/SCOOP powder Take 255 g by mouth 2 (two) times daily.   potassium chloride (K-DUR) 10 MEQ tablet Take 1 tablet (10 mEq total) by mouth daily. TAKE 2 TABLETS (20 MG) IN THE MORNING AND 1 TABLET (10 MG) IN THE EVENING.   simvastatin (ZOCOR) 10 MG tablet TAKE 1 TABLET(10 MG) BY MOUTH DAILY   tamsulosin (FLOMAX) 0.4 MG CAPS capsule Take 0.4 mg by mouth daily after supper. Pt has stopped on his own.     Allergies:   Patient has no known allergies.   Social History   Tobacco Use   Smoking status: Former Smoker    Packs/day: 1.50    Years: 10.00    Pack years: 15.00    Types: Cigarettes    Quit date: 05/15/1976    Years since quitting: 43.4   Smokeless tobacco: Current User    Types: Chew   Tobacco comment: chews tobacco  Substance Use Topics   Alcohol use: Yes    Comment: occ   Drug use: No     Family Hx: The patient's family history includes Anemia in his mother; Arthritis in his father; Heart failure (age of onset: 43) in his father; Hypertension in his father; Prostate cancer in his son. There is no history of Cancer, Diabetes, Colon cancer, Rectal cancer, or Stomach cancer.  ROS:   Please see the history of present illness.     All other systems reviewed and are negative.   Prior CV studies:   The following studies were reviewed today:  none  Labs/Other Tests and Data Reviewed:    EKG:  No ECG reviewed.  Recent Labs: 04/01/2019: Hemoglobin 13.8; Platelets 227.0; TSH 2.44 10/02/2019: ALT 10; BUN 15; Creatinine, Ser 0.93; Potassium 4.8; Sodium 141   Recent Lipid Panel Lab Results  Component Value Date/Time   CHOL 135 10/02/2019 07:41 AM   TRIG 197 (H) 10/02/2019 07:41 AM   HDL 38 (L) 10/02/2019 07:41 AM   CHOLHDL 3.6 10/02/2019 07:41 AM   CHOLHDL 4 01/15/2018 08:49 AM   LDLCALC 64 10/02/2019 07:41 AM   LDLDIRECT  102.0 05/15/2011 08:09 AM    Wt Readings from Last 3 Encounters:  10/08/19 171 lb (77.6 kg)  05/15/19 165 lb (74.8 kg)  04/15/19 165 lb (74.8 kg)     Objective:    Vital Signs:  BP (!) 144/77    Pulse 68    Ht 5\' 8"  (1.727 m)    Wt 171 lb (77.6 kg)    BMI 26.00 kg/m    VITAL SIGNS:  reviewed  ASSESSMENT & PLAN:    1. HTN generally well controlled.  Continue amlodipine and lotensin.   2. Hyperlipidemia. Labs look good. Triglycerides are a little high due to change in diet but  LDL is excellent. Continue current therapy  3. Weight loss. Negative work up to date.    COVID-19 Education: The signs and symptoms of COVID-19 were discussed with the patient and how to seek care for testing (follow up with PCP or arrange E-visit).  The importance of social distancing was discussed today.  Time:   Today, I have spent 15 minutes with the patient with telehealth technology discussing the above problems.     Medication Adjustments/Labs and Tests Ordered: Current medicines are reviewed at length with the patient today.  Concerns regarding medicines are outlined above.   Tests Ordered: No orders of the defined types were placed in this encounter.   Medication Changes: No orders of the defined types were placed in this encounter.  Disposition:  Follow up in 6 month(s)  Signed, Monti Villers Martinique, MD  10/08/2019 8:51 AM    [facility]

## 2019-10-08 ENCOUNTER — Telehealth (INDEPENDENT_AMBULATORY_CARE_PROVIDER_SITE_OTHER): Payer: Medicare Other | Admitting: Cardiology

## 2019-10-08 ENCOUNTER — Encounter: Payer: Self-pay | Admitting: Cardiology

## 2019-10-08 ENCOUNTER — Telehealth: Payer: Medicare Other | Admitting: Cardiology

## 2019-10-08 VITALS — BP 144/77 | HR 68 | Ht 68.0 in | Wt 171.0 lb

## 2019-10-08 DIAGNOSIS — I10 Essential (primary) hypertension: Secondary | ICD-10-CM

## 2019-10-08 DIAGNOSIS — E7800 Pure hypercholesterolemia, unspecified: Secondary | ICD-10-CM

## 2019-10-08 NOTE — Patient Instructions (Signed)
Medication Instructions:  Continue same medications *If you need a refill on your cardiac medications before your next appointment, please call your pharmacy*  Lab Work: None ordered   Testing/Procedures: None ordered  Follow-Up: At [facility], you and your health needs are our priority.  As part of our continuing mission to provide you with exceptional heart care, we have created designated Provider Care Teams.  These Care Teams include your primary Cardiologist (physician) and Advanced Practice Providers (APPs -  Physician Assistants and Nurse Practitioners) who all work together to provide you with the care you need, when you need it.  Your next appointment:  6 months    Call in March to schedule June appointment  The format for your next appointment:  Office   Provider:  Dr.Jordan

## 2019-10-20 ENCOUNTER — Other Ambulatory Visit: Payer: Self-pay | Admitting: Gastroenterology

## 2019-10-20 NOTE — Telephone Encounter (Signed)
Please clarify, Protonix 40 mg 1 QAM, With Pepcid QHS?

## 2019-10-20 NOTE — Telephone Encounter (Signed)
PPI 40 mg Daily (Rx edited). H2RA OK to be QHS. Thanks. GM

## 2019-10-20 NOTE — Telephone Encounter (Signed)
If patient doing well, okay to proceed with once daily dosing. Plan to see him back in follow-up, looks like he is already scheduled for February. Thanks. GM

## 2019-10-20 NOTE — Telephone Encounter (Signed)
Refill request for Protonix BID . Back in August patient was to schedule a follow up for discussion of tapering Protonix BID to only once a day. I called and spoke to his wife as the patient was at work. She states hes still on BID dosing and doing well. Should he go to once a day with a Pepcid AC QHS? Follow up to discuss in further detail has been scheduled for 11-2019. Please advise. Thank you.

## 2019-10-21 ENCOUNTER — Other Ambulatory Visit: Payer: Self-pay

## 2019-10-21 MED ORDER — FAMOTIDINE 20 MG PO TABS: 20.0000 mg | ORAL_TABLET | Freq: Every day | ORAL | 2 refills | Status: DC

## 2019-10-21 NOTE — Telephone Encounter (Signed)
Notified patients wife and rx sent for Pepcid 20 mg QHS 30 with 2 rfs

## 2019-11-22 ENCOUNTER — Other Ambulatory Visit: Payer: Self-pay | Admitting: Cardiology

## 2019-11-24 ENCOUNTER — Other Ambulatory Visit: Payer: Self-pay | Admitting: *Deleted

## 2019-11-24 MED ORDER — AMLODIPINE BESYLATE 2.5 MG PO TABS: ORAL_TABLET | ORAL | 2 refills | Status: DC

## 2019-12-12 ENCOUNTER — Ambulatory Visit: Payer: Medicare Other | Admitting: Gastroenterology

## 2019-12-17 ENCOUNTER — Other Ambulatory Visit: Payer: Self-pay

## 2019-12-17 MED ORDER — POTASSIUM CHLORIDE ER 10 MEQ PO TBCR: 10.0000 meq | EXTENDED_RELEASE_TABLET | Freq: Every day | ORAL | 1 refills | Status: DC

## 2019-12-23 ENCOUNTER — Telehealth: Payer: Self-pay

## 2019-12-23 DIAGNOSIS — I10 Essential (primary) hypertension: Secondary | ICD-10-CM

## 2019-12-23 NOTE — Telephone Encounter (Signed)
Spoke to patient's wife.She stated husband is only taking Kdur 10 meq daily.Stated he has been taking 10 meq daily since last tele visit in December.She would like his potassium checked to make sure he is taking correct dose.Orders placed.He will have done at [facility] in [place] this week.

## 2019-12-24 DIAGNOSIS — I10 Essential (primary) hypertension: Secondary | ICD-10-CM | POA: Diagnosis not present

## 2019-12-25 ENCOUNTER — Telehealth: Payer: Self-pay

## 2019-12-25 LAB — BASIC METABOLIC PANEL
BUN/Creatinine Ratio: 18 (ref 10–24)
BUN: 15 mg/dL (ref 8–27)
CO2: 21 mmol/L (ref 20–29)
Calcium: 9.8 mg/dL (ref 8.6–10.2)
Chloride: 106 mmol/L (ref 96–106)
Creatinine, Ser: 0.85 mg/dL (ref 0.76–1.27)
GFR calc Af Amer: 97 mL/min/{1.73_m2} (ref >59)
GFR calc non Af Amer: 84 mL/min/{1.73_m2} (ref >59)
Glucose: 105 mg/dL — ABNORMAL HIGH (ref 65–99)
Potassium: 4.1 mmol/L (ref 3.5–5.2)
Sodium: 143 mmol/L (ref 134–144)

## 2019-12-25 MED ORDER — POTASSIUM CHLORIDE ER 10 MEQ PO TBCR: 10.0000 meq | EXTENDED_RELEASE_TABLET | Freq: Every day | ORAL | 3 refills | Status: DC

## 2019-12-25 NOTE — Telephone Encounter (Signed)
Spoke to wife she stated husband takes Klor Con 10 meq daily.Prescription phoned in to pharmacy.

## 2019-12-26 DIAGNOSIS — M48061 Spinal stenosis, lumbar region without neurogenic claudication: Secondary | ICD-10-CM | POA: Diagnosis not present

## 2019-12-26 DIAGNOSIS — M5416 Radiculopathy, lumbar region: Secondary | ICD-10-CM | POA: Diagnosis not present

## 2020-01-07 DIAGNOSIS — Z135 Encounter for screening for eye and ear disorders: Secondary | ICD-10-CM | POA: Diagnosis not present

## 2020-01-07 DIAGNOSIS — M5416 Radiculopathy, lumbar region: Secondary | ICD-10-CM | POA: Diagnosis not present

## 2020-01-07 DIAGNOSIS — M545 Low back pain: Secondary | ICD-10-CM | POA: Diagnosis not present

## 2020-01-20 ENCOUNTER — Encounter: Payer: Self-pay | Admitting: Gastroenterology

## 2020-01-20 ENCOUNTER — Ambulatory Visit (INDEPENDENT_AMBULATORY_CARE_PROVIDER_SITE_OTHER): Payer: Medicare Other | Admitting: Gastroenterology

## 2020-01-20 VITALS — Ht 68.0 in | Wt 171.0 lb

## 2020-01-20 DIAGNOSIS — R6881 Early satiety: Secondary | ICD-10-CM

## 2020-01-20 DIAGNOSIS — K5909 Other constipation: Secondary | ICD-10-CM

## 2020-01-20 DIAGNOSIS — R634 Abnormal weight loss: Secondary | ICD-10-CM

## 2020-01-20 MED ORDER — PANTOPRAZOLE SODIUM 40 MG PO TBEC: 40.0000 mg | DELAYED_RELEASE_TABLET | Freq: Every day | ORAL | 3 refills | Status: DC

## 2020-01-20 NOTE — Patient Instructions (Signed)
If you are age 78 or older, your body mass index should be between 23-30. Your Body mass index is 26 kg/m. If this is out of the aforementioned range listed, please consider follow up with your Primary Care Provider.  If you are age 59 or younger, your body mass index should be between 19-25. Your Body mass index is 26 kg/m. If this is out of the aformentioned range listed, please consider follow up with your Primary Care Provider.    We have sent the following medications to your pharmacy for you to pick up at your convenience: Pantoprazole   Your provider has requested that you go to the basement level for lab work to be done at PCP office at your request.   Thank you for choosing me and [facility].  Dr. Rush Landmark

## 2020-01-20 NOTE — Progress Notes (Signed)
[place] VISIT   Primary Care Provider [name], MD [place] [address] [phone]  Patient Profile: Adrian Davis is a 79 y.o. male with a pmh significant for GERD, hypertension, hyperlipidemia, nephrolithiasis, prior skin cancer, chronic back pain, BPH, chronic constipation.  The patient presents to the [facility] for an evaluation and management of problem(s) noted below:  Problem List 1. Unintentional weight loss   2. [place] satiety   3. Chronic constipation     I connected with  Adrian Davis on 01/21/20. I verified that I was speaking with the correct person using two identifiers. Due to the COVID-19 Pandemic, this service was provided via telemedicine using Audio-Only due to patient and family preferences. Interactive audio and video telecommunications were attempted between this provider and patient, however as the patient did not have access to video capability and thus to provide timely and excellent care, we continued and completed visit with audio only. The patient was located at home. The provider was located in the office. The patient did consent to this visit and is aware of charges through their insurance as well as the limitations of evaluation and management by telemedicine. The patient is referred back by his primary care provider. Other persons participating in this telemedicine service were patient's wife. Time spent on visit was 20 minutes on the phone not including documentation or review of the chart.   History of Present Illness Please see initial consultation note by PA [facility] for full details of HPI.  Interval History This is the first visit since the patient's EGD/colonoscopy in the summer 2020.  Overall, the patient has been doing better.  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  The patient's wife describes him having lost a significant amount of muscle mass.  The patient is eating 3 small meals per day and 3 snacks.  The wife recounts that his snacks are really Pepsi that implies no naps.  This is what is being over the course the last few months.  Patient is having bowel movements every other day.  He uses MiraLAX nightly and previously was taking up to 2 but was not having great success with taking too much MiraLAX because then he would have multiple bowel movements and so has only taken once daily.  He also takes docusate 2 pills daily.  Most of his eating indigestion issues have occurred over the course of the last year since his that he has become false teeth and his wife and the patient do believe this has made eating more difficult and more of a chore.  Patient describes [place] satiety and feels full faster.  No history of diabetes noted by patient.  He is not having significant nausea or vomiting.  He has not been doing protein shakes because he is full during the day.  He has an upcoming evaluation with neurosurgery for chronic back issues.  They really want to get this understood prior to any significant GI work-up.  He is currently only taking meloxicam.  He continues to take his PPI once daily which is helpful for his GERD symptoms.  GI Review of Systems Positive as above Negative for dysphagia, odynophagia, pain, bloating, melena, hematochezia  Review of Systems General: Denies fevers/chills HEENT: Denies oral lesions Cardiovascular: Denies chest pain/palpitations Pulmonary: Denies shortness of breath Gastroenterological: See HPI Genitourinary:  Denies darkened urine Hematological: Denies easy bruising/bleeding Endocrine: Denies temperature intolerance Dermatological: Denies jaundice Psychological: Mood is stable   Medications Current Outpatient Medications  Medication Sig Dispense Refill  . acetaminophen (TYLENOL) 650 MG CR tablet Take 650 mg by  mouth every 8 (eight) hours as needed for pain.    Marland Kitchen amLODipine (NORVASC) 2.5 MG tablet take 2 tablets by mouth every morning and 1 tablet IN THE AFTERNOON AS DIRECTED 270 tablet 2  . aspirin EC 81 MG tablet Take 81 mg by mouth daily.    . benazepril (LOTENSIN) 20 MG tablet TAKE 1 TABLET(20 MG) BY MOUTH TWICE DAILY 60 tablet 9  . Cyanocobalamin (B-12) 1000 MCG SUBL Place 1 tablet under the tongue every other day. 30 each   . diclofenac Sodium (VOLTAREN) 1 % GEL Apply topically daily as needed.    . docusate sodium (COLACE) 100 MG capsule Take 1 capsule (100 mg total) by mouth 2 (two) times daily. 180 capsule 3  . famotidine (PEPCID) 20 MG tablet Take 1 tablet (20 mg total) by mouth at bedtime. 30 tablet 2  . meloxicam (MOBIC) 7.5 MG tablet Take 7.5 mg by mouth daily.    . naproxen sodium (ANAPROX) 220 MG tablet Take 440 mg by mouth daily.     . pantoprazole (PROTONIX) 40 MG tablet Take 1 tablet (40 mg total) by mouth daily. 90 tablet 3  . polyethylene glycol powder (GLYCOLAX/MIRALAX) 17 GM/SCOOP powder Take 255 g by mouth 2 (two) times daily. 255 g 3  . potassium chloride (KLOR-CON) 10 MEQ tablet Take 1 tablet (10 mEq total) by mouth daily. 90 tablet 3  . simvastatin (ZOCOR) 10 MG tablet TAKE 1 TABLET(10 MG) BY MOUTH DAILY 30 tablet 8  . tamsulosin (FLOMAX) 0.4 MG CAPS capsule Take 0.4 mg by mouth daily after supper. Pt has stopped on his own.     No current facility-administered medications for this visit.    Allergies No Known Allergies  Histories Past Medical History:  Diagnosis Date  . Abnormal echocardiogram Jan 2012   grade 1 diastolic dysfunction, normal EF, mild LVH  . Basal cell carcinoma 04/08/2019   left nose inferior to nasal alar groove. EDC: 05/20/2019  . Bell palsy 10/2010   presented with facial numbness  . 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 Degenerative disk disease   . GERD (gastroesophageal reflux disease)   . Hyperlipidemia   .  Hypertension    dr [name]  . Lumbar stenosis   . Nephrolithiasis   . Normal nuclear stress test 2008  . Osteoarthritis   . Skin cancer   . Vocal cord leukoplakia    Dr Erik Obey   Past Surgical History:  Procedure Laterality Date  . COLONOSCOPY  04/2019   TAx2, hemorrhoids, rpt 60yrs ([place])  . ESOPHAGOGASTRODUODENOSCOPY  04/2019   erosive gastropathy, biopsy negative for barrett's ([place])  . KNEE ARTHROSCOPY Right   . LUMBAR LAMINECTOMY/DECOMPRESSION MICRODISCECTOMY N/A 11/12/2013   Ophelia Charter, MD - complicated by urinary retention  . SHOULDER SURGERY Bilateral   . TONSILLECTOMY     Social History   Socioeconomic History  . Marital status: Married    Spouse name: Not on file  . Number of children: 1  . Years of education: Not on file  . Highest education level: Not on file  Occupational History  . Occupation: farmer  Tobacco Use  . Smoking status: Former Smoker    Packs/day: 1.50    Years: 10.00  Pack years: 15.00    Types: Cigarettes    Quit date: 05/15/1976    Years since quitting: 43.7  . Smokeless tobacco: Current User    Types: Chew  . Tobacco comment: chews tobacco  Substance and Sexual Activity  . Alcohol use: Yes    Comment: occ  . Drug use: No  . Sexual activity: Not on file  Other Topics Concern  . Not on file  Social History Narrative   Lives with wife   Occ: farm   Edu: HS   Activity: active on farm   Diet: good water, fruits/vegetables daily   Social Determinants of Health   Financial Resource Strain:   . Difficulty of Paying Living Expenses:   Food Insecurity:   . Worried About Charity fundraiser in the Last Year:   . Arboriculturist in the Last Year:   Transportation Needs:   . Film/video editor (Medical):   Marland Kitchen Lack of Transportation (Non-Medical):   Physical Activity:   . Days of Exercise per Week:   . Minutes of Exercise per Session:   Stress:   . Feeling of Stress :   Social Connections:   . Frequency  of Communication with Friends and Family:   . Frequency of Social Gatherings with Friends and Family:   . Attends Religious Services:   . Active Member of Clubs or Organizations:   . Attends Archivist Meetings:   Marland Kitchen Marital Status:   Intimate Partner Violence:   . Fear of Current or Ex-Partner:   . Emotionally Abused:   Marland Kitchen Physically Abused:   . Sexually Abused:    Family History  Problem Relation Age of Onset  . Heart failure Father 33  . Hypertension Father   . Arthritis Father   . Anemia Mother        needed regular transfusions  . Prostate cancer Son   . Cancer Neg Hx   . Diabetes Neg Hx   . Colon cancer Neg Hx   . Rectal cancer Neg Hx   . Stomach cancer Neg Hx   . Inflammatory bowel disease Neg Hx   . Liver disease Neg Hx   . Pancreatic cancer Neg Hx    I have reviewed his medical, social, and family history in detail and updated the electronic medical record as necessary.    PHYSICAL EXAMINATION  Ht 5\' 8"  (1.727 m)   Wt 171 lb (77.6 kg)   BMI 26.00 kg/m  Wt Readings from Last 3 Encounters:  01/20/20 171 lb (77.6 kg)  10/08/19 171 lb (77.6 kg)  05/15/19 165 lb (74.8 kg)  Telemedicine visit   REVIEW OF DATA  I reviewed the following data at the time of this encounter:  GI Procedures and Studies  July 2020 EGD - Normal proximal esophagus and mid esophagus. - Salmon-colored mucosa suspicious for short-segment Barrett's esophagus. Biopsied to rule out Barrett's.. - Z-line irregular, 40 cm from the incisors. - Widely patent and non-obstructing Schatzki ring. - Erythematous mucosa in the antrum. Non-bleeding erosive gastropathy. Biopsied for HP. - No gross lesions in the duodenal bulb, in the first portion of the duodenum and in the second portion of the duodenum. Biopsied.  July 2020 colonoscopy - Hemorrhoids found on digital rectal exam. - The examined portion of the ileum was normal. - Two 1 to 2 mm polyps in the cecum, removed with a cold  snare. Resected and retrieved. - Otherwise, normal mucosa in the entire examined colon. -  Non-bleeding non-thrombosed external and internal hemorrhoids.  Pathology Diagnosis 1. Surgical [P], gastric - INACTIVE GASTRITIS, MILD. - THERE IS NO EVIDENCE OF HELICOBACTER PYLORI, DYSPLASIA, OR MALIGNANCY. - SEE COMMENT. 2. Surgical [P], duodenal - BENIGN SMALL BOWEL MUCOSA. - NO ACTIVE INFLAMMATION OR VILLOUS ATROPHY IDENTIFIED. 3. Surgical [P], distal esophagus - BENIGN SQUAMOUS MUCOSA. - THERE IS NO EVIDENCE OF SIGNIFICANT INFLAMMATION, GOBLET CELL METAPLASIA, DYSPLASIA, OR MALIGNANCY. - SEE COMMENT. 4. Surgical [P], colon, cecum, polyp (2) - TUBULAR ADENOMA(S). - HIGH GRADE DYSPLASIA IS NOT IDENTIFIED.  Laboratory Studies  Reviewed those in epic  Imaging Studies  No new relevant studies to review   ASSESSMENT  Mr. Andry is a 79 y.o. male  with a pmh significant for GERD, hypertension, hyperlipidemia, nephrolithiasis, prior skin cancer, chronic back pain, BPH, chronic constipation.  The patient is seen today for evaluation and management of:  1. Unintentional weight loss   2. Imburgia satiety   3. Chronic constipation    The patient seems to be hemodynamically stable and from a clinical perspective overall has done relatively well over the last 8 months since our last evaluation during his EGD/colonoscopy.  We have discussed the possibility of an underlying gastroparesis and I would normally recommend that we move forward with a solid-phase gastric emptying study.  The patient and wife want to hold on this while he is getting worked up for his chronic back pain issues.  As he has not had any significant weight loss over the course of 8 months and he is not having significant nausea and vomiting we may not have a positive gastric emptying study but I do think it is worthwhile for [place] to strongly consider this as a potential diagnosis.  I recommended that the patient start drinking a protein  shake with each of his snacks rather than a Pepsi-Cola in an effort of trying to gain more nutritious calories and protein in order to stabilize his weight further.  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  He has had prior imaging including a CT abdomen/pelvis in 2019 he had a chest x-ray 2020.  If patient continues to have issues we certainly can consider a cross-sectional CT abdomen pelvis and PCP can consider the role of a CT chest for completion of work-up of unintentional weight loss.  I am okay with the patient continuing MiraLAX but he continues to have issues we certainly can consider Amitiza or Linzess but he feels uncomfortable when he has needed bowel meds just with the use of MiraLAX.  I have asked him to initiate FiberCon 1 to 2 pills daily as well and we will see how his constipation is doing the course of few months.  And wife are awaiting neurosurgery evaluation for his chronic low back pain and effort of trying to see if there is anything else that can be done.  He is taking meloxicam but thankfully is also taking a PPI and I would continue the PPI while he remains on an NSAID due to prior gastritis being found.  All patient questions were answered, to the best of my ability, and the patient agrees to the aforementioned plan of action with follow-up as indicated and we will see him in follow-up in a few months after he has had his completion of work-up with neurosurgery.   PLAN  Laboratories as outlined below to be obtained at his PCP office through [place]  solid-phase gastric emptying study however patient and wife defer on this currently Continue MiraLAX 1-2 times daily May continue to Colace 200 mg daily May consider Amitiza or Trulance or Linzess in future Start FiberCon 1 to 2 pills daily If significant weight loss recurs then consider repeat CT abdomen/pelvis and CT chest could be considered by PCP Refill  PPI   Orders Placed This Encounter  Procedures  . CBC  . Basic Metabolic Panel (BMET)  . TSH  . Cortisol  . IgA  . Tissue transglutaminase, IgA    New Prescriptions   No medications on file   Modified Medications   Modified Medication Previous Medication   PANTOPRAZOLE (PROTONIX) 40 MG TABLET pantoprazole (PROTONIX) 40 MG tablet      Take 1 tablet (40 mg total) by mouth daily.    Take 1 tablet (40 mg total) by mouth daily.    Planned Follow Up No follow-ups on file.   Total Time in Face-to-Face and in Coordination of Care for patient including independent/personal interpretation/review of prior testing, medical history, examination, medication adjustment, communicating results with the patient directly, and documentation with the EHR is greater than 30 minutes.   Justice Britain, MD [facility] Office # [phone]

## 2020-01-21 ENCOUNTER — Other Ambulatory Visit (INDEPENDENT_AMBULATORY_CARE_PROVIDER_SITE_OTHER): Payer: Medicare Other

## 2020-01-21 ENCOUNTER — Encounter: Payer: Self-pay | Admitting: Gastroenterology

## 2020-01-21 DIAGNOSIS — R6881 Early satiety: Secondary | ICD-10-CM

## 2020-01-21 DIAGNOSIS — R634 Abnormal weight loss: Secondary | ICD-10-CM

## 2020-01-21 DIAGNOSIS — K5909 Other constipation: Secondary | ICD-10-CM | POA: Insufficient documentation

## 2020-01-21 LAB — BASIC METABOLIC PANEL
BUN: 16 mg/dL (ref 6–23)
CO2: 26 mEq/L (ref 19–32)
Calcium: 9.2 mg/dL (ref 8.4–10.5)
Chloride: 106 mEq/L (ref 96–112)
Creatinine, Ser: 0.81 mg/dL (ref 0.40–1.50)
GFR: 92.24 mL/min (ref >60.00)
Glucose, Bld: 121 mg/dL — ABNORMAL HIGH (ref 70–99)
Potassium: 4 mEq/L (ref 3.5–5.1)
Sodium: 139 mEq/L (ref 135–145)

## 2020-01-21 LAB — CBC
HCT: 38.7 % — ABNORMAL LOW (ref 39.0–52.0)
Hemoglobin: 13.1 g/dL (ref 13.0–17.0)
MCHC: 33.9 g/dL (ref 30.0–36.0)
MCV: 92 fl (ref 78.0–100.0)
Platelets: 224 10*3/uL (ref 150.0–400.0)
RBC: 4.21 Mil/uL — ABNORMAL LOW (ref 4.22–5.81)
RDW: 14.1 % (ref 11.5–15.5)
WBC: 5.2 10*3/uL (ref 4.0–10.5)

## 2020-01-21 LAB — TSH: TSH: 2.42 u[IU]/mL (ref 0.35–4.50)

## 2020-01-21 LAB — IGA: IgA: 203 mg/dL (ref 68–378)

## 2020-01-21 LAB — CORTISOL: Cortisol, Plasma: 7.5 ug/dL

## 2020-01-22 LAB — TISSUE TRANSGLUTAMINASE, IGA: (tTG) Ab, IgA: 1 U/mL

## 2020-01-26 ENCOUNTER — Other Ambulatory Visit: Payer: Medicare Other

## 2020-02-03 DIAGNOSIS — R030 Elevated blood-pressure reading, without diagnosis of hypertension: Secondary | ICD-10-CM | POA: Diagnosis not present

## 2020-02-03 DIAGNOSIS — M4316 Spondylolisthesis, lumbar region: Secondary | ICD-10-CM | POA: Diagnosis not present

## 2020-02-03 DIAGNOSIS — Z6827 Body mass index (BMI) 27.0-27.9, adult: Secondary | ICD-10-CM | POA: Diagnosis not present

## 2020-02-03 DIAGNOSIS — M47816 Spondylosis without myelopathy or radiculopathy, lumbar region: Secondary | ICD-10-CM | POA: Diagnosis not present

## 2020-02-03 DIAGNOSIS — M5136 Other intervertebral disc degeneration, lumbar region: Secondary | ICD-10-CM | POA: Diagnosis not present

## 2020-02-11 ENCOUNTER — Ambulatory Visit (INDEPENDENT_AMBULATORY_CARE_PROVIDER_SITE_OTHER): Payer: Medicare Other | Admitting: Podiatry

## 2020-02-11 ENCOUNTER — Other Ambulatory Visit: Payer: Self-pay

## 2020-02-11 ENCOUNTER — Ambulatory Visit: Payer: Medicare Other

## 2020-02-11 ENCOUNTER — Encounter: Payer: Self-pay | Admitting: Podiatry

## 2020-02-11 VITALS — Temp 97.9°F

## 2020-02-11 DIAGNOSIS — Q828 Other specified congenital malformations of skin: Secondary | ICD-10-CM | POA: Diagnosis not present

## 2020-02-11 DIAGNOSIS — M2042 Other hammer toe(s) (acquired), left foot: Secondary | ICD-10-CM

## 2020-02-11 NOTE — Progress Notes (Signed)
Subjective:  Patient ID: Adrian Davis, male    DOB: 1940/12/14,  MRN: [id number] HPI Chief Complaint  Patient presents with  . Callouses    Patient presents today for painful corn on left 5th toe x years.  he says its become worse in the past year and it really hurts when he is walking.  He has tried otc corn pads which have not helped    79 y.o. male presents with the above complaint.   ROS: Denies fever chills nausea vomiting muscle aches pains calf pain back pain chest pain shortness of breath.  Past Medical History:  Diagnosis Date  . Abnormal echocardiogram Jan 2012   grade 1 diastolic dysfunction, normal EF, mild LVH  . Basal cell carcinoma 04/08/2019   left nose inferior to nasal alar groove. EDC: 05/20/2019  . Bell palsy 10/2010   presented with facial numbness  . 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 Degenerative disk disease   . GERD (gastroesophageal reflux disease)   . Hyperlipidemia   . Hypertension    dr [name]  . Lumbar stenosis   . Nephrolithiasis   . Normal nuclear stress test 2008  . Osteoarthritis   . Skin cancer   . Vocal cord leukoplakia    Dr Erik Obey   Past Surgical History:  Procedure Laterality Date  . COLONOSCOPY  04/2019   TAx2, hemorrhoids, rpt 70yrs ([place])  . ESOPHAGOGASTRODUODENOSCOPY  04/2019   erosive gastropathy, biopsy negative for barrett's ([place])  . KNEE ARTHROSCOPY Right   . LUMBAR LAMINECTOMY/DECOMPRESSION MICRODISCECTOMY N/A 11/12/2013   Ophelia Charter, MD - complicated by urinary retention  . SHOULDER SURGERY Bilateral   . TONSILLECTOMY      Current Outpatient Medications:  .  acetaminophen (TYLENOL) 650 MG CR tablet, Take 650 mg by mouth every 8 (eight) hours as needed for pain., Disp: , Rfl:  .  amLODipine (NORVASC) 2.5 MG tablet, take 2 tablets by mouth every morning and 1 tablet IN THE AFTERNOON AS DIRECTED, Disp: 270 tablet, Rfl: 2 .  aspirin EC 81 MG tablet, Take 81 mg by mouth  daily., Disp: , Rfl:  .  benazepril (LOTENSIN) 20 MG tablet, TAKE 1 TABLET(20 MG) BY MOUTH TWICE DAILY, Disp: 60 tablet, Rfl: 9 .  Cyanocobalamin (B-12) 1000 MCG SUBL, Place 1 tablet under the tongue every other day., Disp: 30 each, Rfl:  .  diclofenac Sodium (VOLTAREN) 1 % GEL, Apply topically daily as needed., Disp: , Rfl:  .  docusate sodium (COLACE) 100 MG capsule, Take 1 capsule (100 mg total) by mouth 2 (two) times daily., Disp: 180 capsule, Rfl: 3 .  famotidine (PEPCID) 20 MG tablet, Take 1 tablet (20 mg total) by mouth at bedtime., Disp: 30 tablet, Rfl: 2 .  meloxicam (MOBIC) 7.5 MG tablet, Take 7.5 mg by mouth daily., Disp: , Rfl:  .  pantoprazole (PROTONIX) 40 MG tablet, Take 1 tablet (40 mg total) by mouth daily., Disp: 90 tablet, Rfl: 3 .  polyethylene glycol powder (GLYCOLAX/MIRALAX) 17 GM/SCOOP powder, Take 255 g by mouth 2 (two) times daily., Disp: 255 g, Rfl: 3 .  potassium chloride (KLOR-CON) 10 MEQ tablet, Take 1 tablet (10 mEq total) by mouth daily., Disp: 90 tablet, Rfl: 3 .  simvastatin (ZOCOR) 10 MG tablet, TAKE 1 TABLET(10 MG) BY MOUTH DAILY, Disp: 30 tablet, Rfl: 8 .  tamsulosin (FLOMAX) 0.4 MG CAPS capsule, Take 0.4 mg by mouth daily after supper. Pt has stopped on his own., Disp: ,  Rfl:   No Known Allergies Review of Systems Objective:   Vitals:   02/11/20 0933  Temp: 97.9 F (36.6 C)    General: Well developed, nourished, in no acute distress, alert and oriented x3   Dermatological: Skin is warm, dry and supple bilateral. Nails x 10 are well maintained; remaining integument appears unremarkable at this time. There are no open sores, no preulcerative lesions, no rash or signs of infection present.  Reactive hyperkeratotic lesion medial aspect of the fifth digit of the left foot no lesion on the lateral aspect of the fourth toe however  Vascular: Dorsalis Pedis artery and Posterior Tibial artery pedal pulses are 2/4 bilateral with immedate capillary fill time.  Pedal hair growth present. No varicosities and no lower extremity edema present bilateral.   Neruologic: Grossly intact via light touch bilateral. Vibratory intact via tuning fork bilateral. Protective threshold with Semmes Wienstein monofilament intact to all pedal sites bilateral. Patellar and Achilles deep tendon reflexes 2+ bilateral. No Babinski or clonus noted bilateral.   Musculoskeletal: No gross boney pedal deformities bilateral. No pain, crepitus, or limitation noted with foot and ankle range of motion bilateral. Muscular strength 5/5 in all groups tested bilateral.  Gait: Unassisted, Nonantalgic.    Radiographs:  Not taken  Assessment & Plan:   Assessment: Digital spur resulting in reactive hyperkeratotic lesion fifth toe left foot.  Plan: Discussed the need for surgical intervention he understands this is amenable to it debrided debrided lesion today for him and placed silicone padding.  We will discuss this once again in the fall for surgical intervention.     [name] T. [place], [place]

## 2020-02-24 ENCOUNTER — Ambulatory Visit: Payer: Medicare Other | Admitting: Gastroenterology

## 2020-05-04 ENCOUNTER — Telehealth: Payer: Self-pay | Admitting: Cardiology

## 2020-05-04 DIAGNOSIS — I10 Essential (primary) hypertension: Secondary | ICD-10-CM

## 2020-05-04 DIAGNOSIS — E7800 Pure hypercholesterolemia, unspecified: Secondary | ICD-10-CM

## 2020-05-04 NOTE — Telephone Encounter (Signed)
New message   Patient's wife states that labs need to be sent to the [facility]. Please call.

## 2020-05-04 NOTE — Telephone Encounter (Signed)
Spoke to patient's wife advised lab orders placed for husband to have fasting bmet,psa, lipid and hepatic panels.

## 2020-05-05 DIAGNOSIS — E7800 Pure hypercholesterolemia, unspecified: Secondary | ICD-10-CM | POA: Diagnosis not present

## 2020-05-05 DIAGNOSIS — I10 Essential (primary) hypertension: Secondary | ICD-10-CM | POA: Diagnosis not present

## 2020-05-06 ENCOUNTER — Telehealth: Payer: Self-pay | Admitting: Cardiology

## 2020-05-06 LAB — BASIC METABOLIC PANEL
BUN/Creatinine Ratio: 19 (ref 10–24)
BUN: 20 mg/dL (ref 8–27)
CO2: 23 mmol/L (ref 20–29)
Calcium: 9.5 mg/dL (ref 8.6–10.2)
Chloride: 105 mmol/L (ref 96–106)
Creatinine, Ser: 1.04 mg/dL (ref 0.76–1.27)
GFR calc Af Amer: 80 mL/min/{1.73_m2} (ref >59)
GFR calc non Af Amer: 69 mL/min/{1.73_m2} (ref >59)
Glucose: 96 mg/dL (ref 65–99)
Potassium: 4.1 mmol/L (ref 3.5–5.2)
Sodium: 141 mmol/L (ref 134–144)

## 2020-05-06 LAB — PSA: Prostate Specific Ag, Serum: 1.5 ng/mL (ref 0.0–4.0)

## 2020-05-06 LAB — HEPATIC FUNCTION PANEL
ALT: 10 IU/L (ref 0–44)
AST: 10 IU/L (ref 0–40)
Albumin: 4.4 g/dL (ref 3.7–4.7)
Alkaline Phosphatase: 65 IU/L (ref 48–121)
Bilirubin Total: 0.2 mg/dL (ref 0.0–1.2)
Bilirubin, Direct: 0.08 mg/dL (ref 0.00–0.40)
Total Protein: 6.4 g/dL (ref 6.0–8.5)

## 2020-05-06 LAB — LIPID PANEL
Chol/HDL Ratio: 4.1 ratio (ref 0.0–5.0)
Cholesterol, Total: 154 mg/dL (ref 100–199)
HDL: 38 mg/dL — ABNORMAL LOW (ref >39)
LDL Chol Calc (NIH): 86 mg/dL (ref 0–99)
Triglycerides: 175 mg/dL — ABNORMAL HIGH (ref 0–149)
VLDL Cholesterol Cal: 30 mg/dL (ref 5–40)

## 2020-05-06 NOTE — Telephone Encounter (Signed)
Spoke to [employer] of results and verbalized understanding.

## 2020-05-06 NOTE — Telephone Encounter (Signed)
Patient's wife returning call for lab results. 

## 2020-05-13 NOTE — Progress Notes (Signed)
Adrian Davis Date of Birth: 03-Jul-1941 Medical Record #[id number]  History of Present Illness: Adrian Davis is seen today for follow up of HTN. He has a history of HTN and HLD. No known CAD. He had a normal Adenosine Myoview back in 2008. His last echo was in January of 2012 showing grade 1 diastolic dysfunction, mild LVH and a normal EF.    On follow up today he states he is doing well. He is still working on the farm. Denies any chest pain, SOB, palpitations, dizziness. Weight has improved.   Current Outpatient Medications on File Prior to Visit  Medication Sig Dispense Refill  . acetaminophen (TYLENOL) 650 MG CR tablet Take 650 mg by mouth every 8 (eight) hours as needed for pain.    Marland Kitchen amLODipine (NORVASC) 2.5 MG tablet take 2 tablets by mouth every morning and 1 tablet IN THE AFTERNOON AS DIRECTED 270 tablet 2  . aspirin EC 81 MG tablet Take 81 mg by mouth daily.    . Cyanocobalamin (B-12) 1000 MCG SUBL Place 1 tablet under the tongue every other day. 30 each   . diclofenac Sodium (VOLTAREN) 1 % GEL Apply topically daily as needed.    . docusate sodium (COLACE) 100 MG capsule Take 1 capsule (100 mg total) by mouth 2 (two) times daily. 180 capsule 3  . famotidine (PEPCID) 20 MG tablet Take 1 tablet (20 mg total) by mouth at bedtime. 30 tablet 2  . meloxicam (MOBIC) 7.5 MG tablet Take 7.5 mg by mouth daily.    . pantoprazole (PROTONIX) 40 MG tablet Take 1 tablet (40 mg total) by mouth daily. 90 tablet 3  . polyethylene glycol powder (GLYCOLAX/MIRALAX) 17 GM/SCOOP powder Take 255 g by mouth 2 (two) times daily. 255 g 3  . potassium chloride (KLOR-CON) 10 MEQ tablet Take 1 tablet (10 mEq total) by mouth daily. 90 tablet 3   No current facility-administered medications on file prior to visit.    No Known Allergies  Past Medical History:  Diagnosis Date  . Abnormal echocardiogram Jan 2012   grade 1 diastolic dysfunction, normal EF, mild LVH  . Basal cell carcinoma 04/08/2019   left nose  inferior to nasal alar groove. EDC: 05/20/2019  . Bell palsy 10/2010   presented with facial numbness  . 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 Degenerative disk disease   . GERD (gastroesophageal reflux disease)   . Hyperlipidemia   . Hypertension    dr Cyndra Feinberg [place]  . Lumbar stenosis   . Nephrolithiasis   . Normal nuclear stress test 2008  . Osteoarthritis   . Skin cancer   . Vocal cord leukoplakia    Dr Erik Obey    Past Surgical History:  Procedure Laterality Date  . COLONOSCOPY  04/2019   TAx2, hemorrhoids, rpt 61yrs (Adrian Davis)  . ESOPHAGOGASTRODUODENOSCOPY  04/2019   erosive gastropathy, biopsy negative for barrett's (Adrian Davis)  . KNEE ARTHROSCOPY Right   . LUMBAR LAMINECTOMY/DECOMPRESSION MICRODISCECTOMY N/A 11/12/2013   Adrian Charter, MD - complicated by urinary retention  . SHOULDER SURGERY Bilateral   . TONSILLECTOMY      Social History   Tobacco Use  Smoking Status Former Smoker  . Packs/day: 1.50  . Years: 10.00  . Pack years: 15.00  . Types: Cigarettes  . Quit date: 05/15/1976  . Years since quitting: 44.0  Smokeless Tobacco Current User  . Types: Chew  Tobacco Comment   chews tobacco    Social History  Substance and Sexual Activity  Alcohol Use Yes   Comment: occ    Family History  Problem Relation Age of Onset  . Heart failure Father 26  . Hypertension Father   . Arthritis Father   . Anemia Mother        needed regular transfusions  . Prostate cancer Son   . Cancer Neg Hx   . Diabetes Neg Hx   . Colon cancer Neg Hx   . Rectal cancer Neg Hx   . Stomach cancer Neg Hx   . Inflammatory bowel disease Neg Hx   . Liver disease Neg Hx   . Pancreatic cancer Neg Hx     Review of Systems: The review of systems is per the HPI.  All other systems were reviewed and are negative.  Physical Exam: BP (!) 142/82   Pulse 65   Ht 5\' 8"  (1.727 m)   Wt 179 lb (81.2 kg)   SpO2 98%   BMI 27.22 kg/m  GENERAL:  Well  appearing WM in NAD HEENT:  PERRL, EOMI, sclera are clear. Oropharynx is clear. NECK:  No jugular venous distention, carotid upstroke brisk and symmetric, no bruits, no thyromegaly or adenopathy LUNGS:  Clear to auscultation bilaterally CHEST:  Unremarkable HEART:  RRR,  PMI not displaced or sustained,S1 and S2 within normal limits, no S3, no S4: no clicks, no rubs, no murmurs ABD:  Soft, nontender. BS +, no masses or bruits. No hepatomegaly, no splenomegaly EXT:  2 + pulses throughout, no edema, no cyanosis no clubbing SKIN:  Warm and dry.  No rashes NEURO:  Alert and oriented x 3. Cranial nerves II through XII intact. PSYCH:  Cognitively intact      Laboratory data: Lab Results  Component Value Date   WBC 5.2 01/21/2020   HGB 13.1 01/21/2020   HCT 38.7 (L) 01/21/2020   PLT 224.0 01/21/2020   GLUCOSE 96 05/05/2020   CHOL 154 05/05/2020   TRIG 175 (H) 05/05/2020   HDL 38 (L) 05/05/2020   LDLDIRECT 102.0 05/15/2011   LDLCALC 86 05/05/2020   ALT 10 05/05/2020   AST 10 05/05/2020   NA 141 05/05/2020   K 4.1 05/05/2020   CL 105 05/05/2020   CREATININE 1.04 05/05/2020   BUN 20 05/05/2020   CO2 23 05/05/2020   TSH 2.42 01/21/2020   PSA 1.50 04/01/2019   INR 1.01 11/12/2010   HGBA1C (H) 11/12/2010    5.7 (NOTE)                                                                       According to the ADA Clinical Practice Recommendations for 2011, when HbA1c is used as a screening test:   >=6.5%   Diagnostic of Diabetes Mellitus           (if abnormal result  is confirmed)  5.7-6.4%   Increased risk of developing Diabetes Mellitus  References:Diagnosis and Classification of Diabetes Mellitus,Diabetes KGMW,1027,25(DGUYQ 1):S62-S69 and Standards of Medical Care in         Diabetes - 2011,Diabetes Care,2011,34  (Suppl 1):S11-S61.   Ecg today shows NSR with nonspecific T wave abnormality. Possible LVH. I have personally reviewed and interpreted this study.  Assessment / Plan: 1. HTN  well controlled.  Continue amlodipine and lotensin. Only uses HCTZ prn for swelling.  2. Hyperlipidemia. Labs look good. Continue current therapy  3. Weight loss. Improved now.   Follow up in 6 months.

## 2020-05-14 ENCOUNTER — Encounter: Payer: Self-pay | Admitting: Cardiology

## 2020-05-14 ENCOUNTER — Other Ambulatory Visit: Payer: Self-pay

## 2020-05-14 ENCOUNTER — Telehealth: Payer: Self-pay | Admitting: Cardiology

## 2020-05-14 ENCOUNTER — Ambulatory Visit (INDEPENDENT_AMBULATORY_CARE_PROVIDER_SITE_OTHER): Payer: Medicare Other | Admitting: Cardiology

## 2020-05-14 VITALS — BP 142/82 | HR 65 | Ht 68.0 in | Wt 179.0 lb

## 2020-05-14 DIAGNOSIS — E7800 Pure hypercholesterolemia, unspecified: Secondary | ICD-10-CM

## 2020-05-14 DIAGNOSIS — I10 Essential (primary) hypertension: Secondary | ICD-10-CM

## 2020-05-14 MED ORDER — SILDENAFIL CITRATE 50 MG PO TABS
50.0000 mg | ORAL_TABLET | Freq: Every day | ORAL | 5 refills | Status: DC | PRN
Start: 2020-05-14 — End: 2020-05-17

## 2020-05-14 MED ORDER — TAMSULOSIN HCL 0.4 MG PO CAPS: 0.4000 mg | ORAL_CAPSULE | Freq: Every day | ORAL | 3 refills | Status: DC

## 2020-05-14 MED ORDER — SIMVASTATIN 10 MG PO TABS: 10.0000 mg | ORAL_TABLET | Freq: Every day | ORAL | 3 refills | Status: DC

## 2020-05-14 MED ORDER — BENAZEPRIL HCL 20 MG PO TABS: 20.0000 mg | ORAL_TABLET | Freq: Two times a day (BID) | ORAL | 3 refills | Status: DC

## 2020-05-14 NOTE — Telephone Encounter (Signed)
Pt c/o medication issue:  1. Name of Medication: sildenafil (VIAGRA) 50 MG tablet  2. How are you currently taking this medication (dosage and times per day)? n/a  3. Are you having a reaction (difficulty breathing--STAT)? no  4. What is your medication issue?  Insurance will not approve for 50mg . She has a good Rx card but the price is still $256. [facility] has a discount card and this medication in 20mg  tablets. The pharmacist told her that he could take 2 tablets if Dr. Martin[place] will write a new prescription. The medication will only cost $15 if they do it this way. She wants to know if this can be done and sent to [facility].

## 2020-05-14 NOTE — Patient Instructions (Signed)
Continue your current therapy  Use [facility] to get discount on Viagra

## 2020-05-17 MED ORDER — SILDENAFIL CITRATE 20 MG PO TABS: ORAL_TABLET | ORAL | 3 refills | Status: DC

## 2020-05-17 NOTE — Telephone Encounter (Signed)
Ok to order the 20 mg tablets. May take 2-5 tablets as needed. I would give him 50 pills.  Lenward Able Martin[place] MD, [facility]

## 2020-05-17 NOTE — Telephone Encounter (Signed)
Spoke to patient's wife Dr.Jordan prescribed Sildenafil 20 mg take 2 to 5 tablets as needed.Prescription sent to pharmacy.

## 2020-05-17 NOTE — Telephone Encounter (Signed)
Message sent to [facility] for a order.

## 2020-05-27 DIAGNOSIS — H52229 Regular astigmatism, unspecified eye: Secondary | ICD-10-CM | POA: Diagnosis not present

## 2020-06-08 ENCOUNTER — Other Ambulatory Visit: Payer: Self-pay

## 2020-06-08 DIAGNOSIS — M1712 Unilateral primary osteoarthritis, left knee: Secondary | ICD-10-CM | POA: Diagnosis not present

## 2020-06-10 ENCOUNTER — Other Ambulatory Visit: Payer: Self-pay | Admitting: Cardiology

## 2020-06-10 MED ORDER — DOCUSATE SODIUM 100 MG PO CAPS: 100.0000 mg | ORAL_CAPSULE | Freq: Two times a day (BID) | ORAL | 3 refills | Status: AC

## 2020-06-10 NOTE — Telephone Encounter (Signed)
*  STAT* If patient is at the pharmacy, call can be transferred to refill team.   1. Which medications need to be refilled? (please list name of each medication and dose if known)? docusate sodium (COLACE) 100 MG capsule  2. Which pharmacy/location (including street and city if local pharmacy) is medication to be sent to? [id number] - [place],  - [place]  3. Do they need a 30 day or 90 day supply? 90 day

## 2020-06-11 IMAGING — MR MRI ABDOMEN WITH AND WITHOUT CONTRAST
17 series · 48 of 48 positions shown · IV contrast (multihance)
Comparison: 11/04/2018 CT abdomen/pelvis.

CLINICAL DATA: Indeterminate bilateral renal lesions on recent
noncontrast CT.

EXAM:
MRI ABDOMEN WITHOUT AND WITH CONTRAST
TECHNIQUE: Multiplanar multisequence MR imaging of the abdomen was performed
both before and after the administration of intravenous contrast.
Creatinine was obtained on site at [HOSPITAL] at [HOSPITAL].
Results: Creatinine 0.7 mg/dL.
CONTRAST:  16mL MULTIHANCE GADOBENATE DIMEGLUMINE 529 MG/ML IV SOLN

[Series 5: T2 · coronal · 5.0mm · 1.56mm/px · 1 of 36 slices shown (1 of 3)]
[im 1/36]
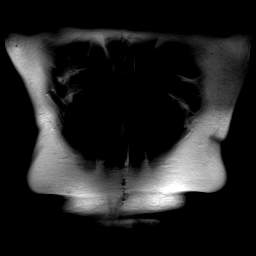

[Series 6: T1 · axial · 4.0mm · 1.19mm/px · z∈[-125,+127]mm · 5 of 128 slices shown]
[im 1/128]
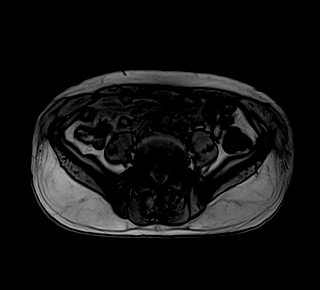
[im 32/128]
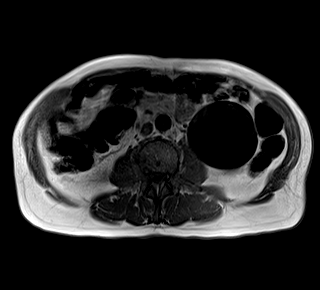
[im 64/128]
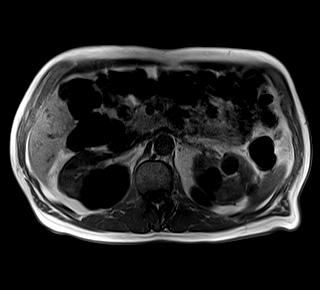
[im 96/128]
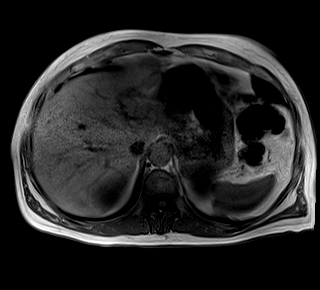
[im 128/128]
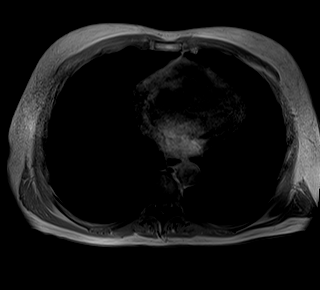

[Series 7: T2 · axial · 6.0mm · 1.22mm/px · 1 of 36 slices shown (2 of 3)]
[im 1/36]
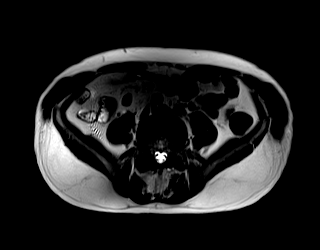

[Series 8: bSSFP · axial · 5.0mm · 1.25mm/px · z∈[-128,+130]mm · 2 of 44 slices shown]
[im 1/44]
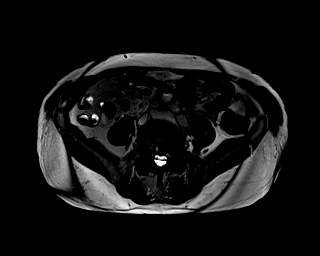
[im 44/44]
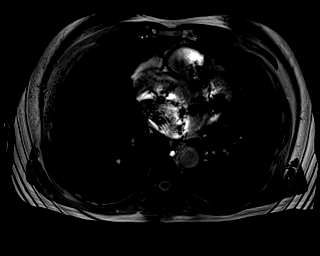

[Series 9: T2 · axial · 5.0mm · 1.48mm/px · z∈[-124,+140]mm · 2 of 45 slices shown (3 of 3)]
[im 1/45]
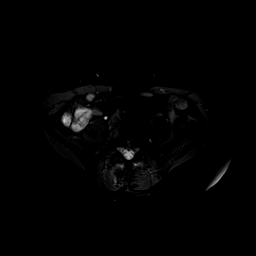
[im 45/45]
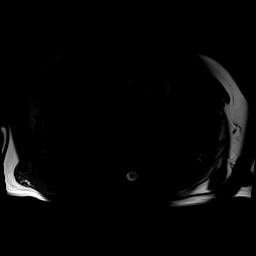

[Series 10: DWI · axial · 5.0mm · 1.42mm/px · z∈[-124,+140]mm · 5 of 135 slices shown (1 of 2)]
[im 1/135]
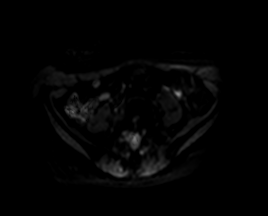
[im 34/135]
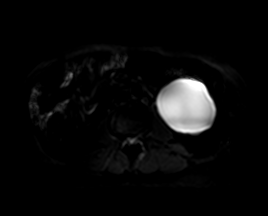
[im 68/135]
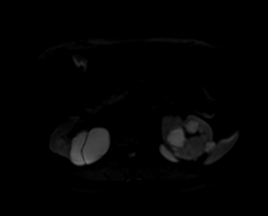
[im 101/135]
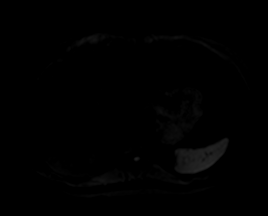
[im 135/135]
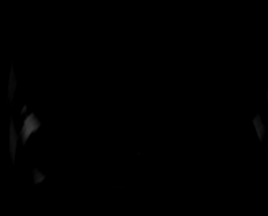

[Series 11: DWI · axial · 5.0mm · 1.42mm/px · z∈[-124,+140]mm · 2 of 45 slices shown (2 of 2)]
[im 1/45]
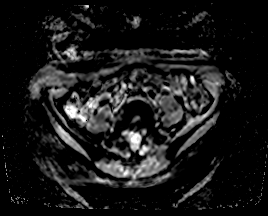
[im 45/45]
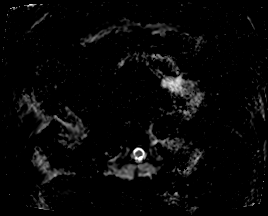

[Series 12: T1 dynamic · axial · non-contrast · 3.0mm · 1.25mm/px · z∈[-129,+132]mm · 3 of 88 slices shown]
[im 1/88]
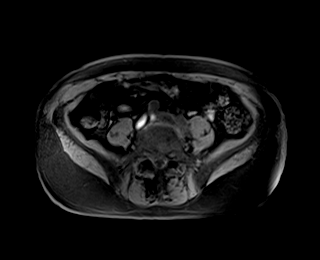
[im 44/88]
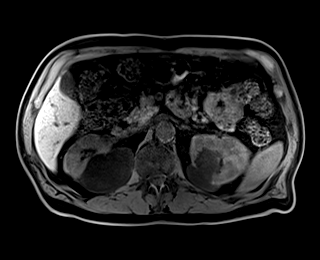
[im 88/88]
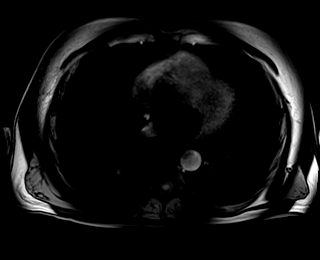

[Series 13: T1 dynamic post-contrast · axial · 3.0mm · 1.25mm/px · z∈[-129,+132]mm · 3 of 88 slices shown (1 of 9)]
[im 1/88]
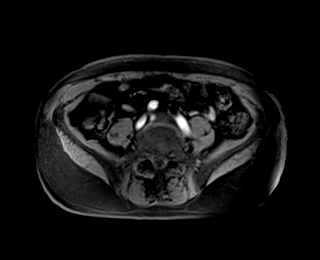
[im 44/88]
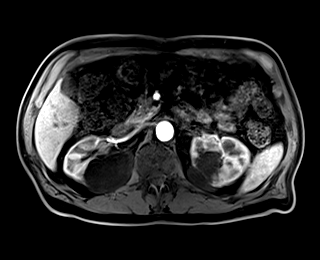
[im 88/88]
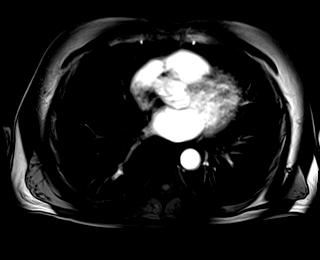

[Series 14: T1 dynamic post-contrast · axial · 3.0mm · 1.25mm/px · z∈[-129,+132]mm · 3 of 88 slices shown (2 of 9)]
[im 1/88]
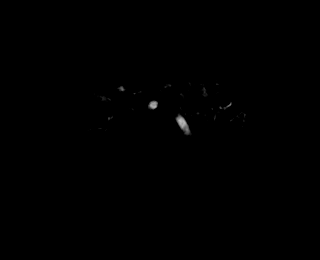
[im 44/88]
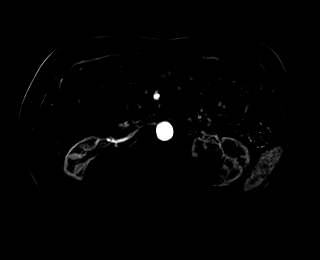
[im 88/88]
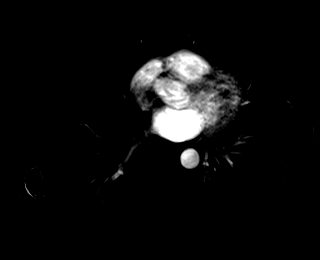

[Series 15: T1 dynamic post-contrast · axial · 3.0mm · 1.25mm/px · z∈[-129,+132]mm · 3 of 88 slices shown (3 of 9)]
[im 1/88]
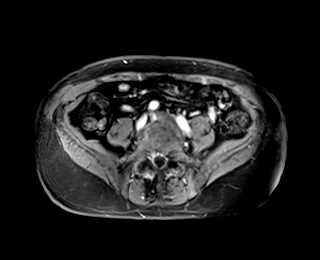
[im 44/88]
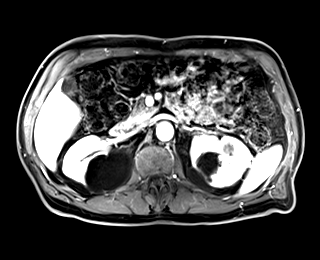
[im 88/88]
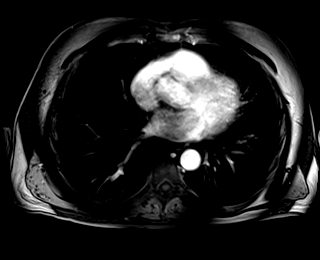

[Series 16: T1 dynamic post-contrast · axial · 3.0mm · 1.25mm/px · z∈[-129,+132]mm · 3 of 88 slices shown (4 of 9)]
[im 1/88]
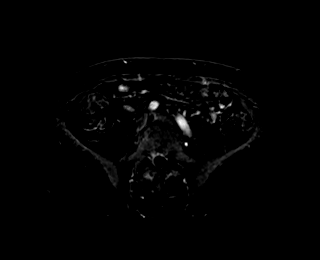
[im 44/88]
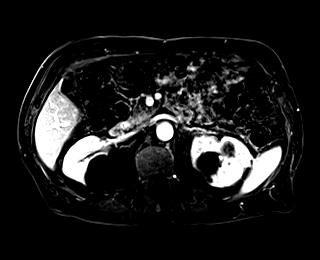
[im 88/88]
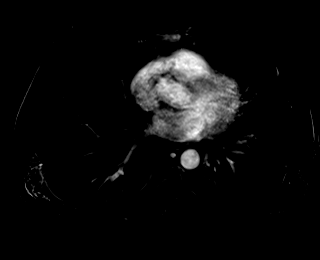

[Series 17: T1 dynamic post-contrast · axial · 3.0mm · 1.25mm/px · z∈[-129,+132]mm · 3 of 88 slices shown (5 of 9)]
[im 1/88]
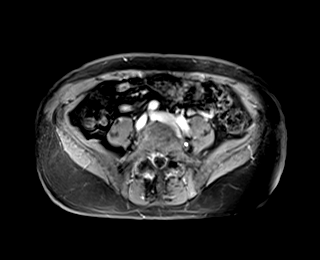
[im 44/88]
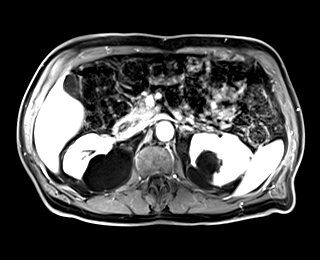
[im 88/88]
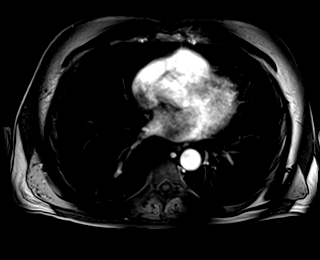

[Series 18: T1 dynamic post-contrast · axial · 3.0mm · 1.25mm/px · z∈[-129,+132]mm · 3 of 88 slices shown (6 of 9)]
[im 1/88]
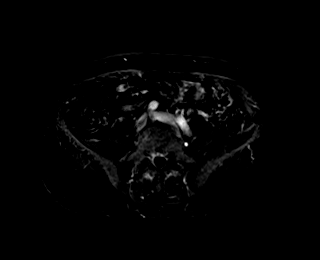
[im 44/88]
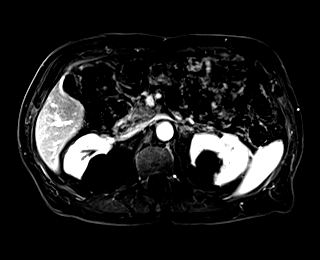
[im 88/88]
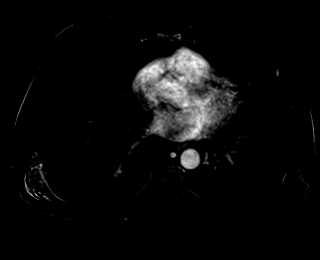

[Series 19: T1 dynamic post-contrast · coronal · 3.0mm · 1.25mm/px · 3 of 72 slices shown (7 of 9)]
[im 1/72]
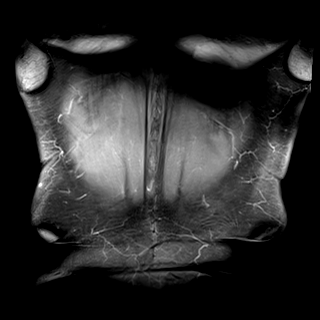
[im 36/72]
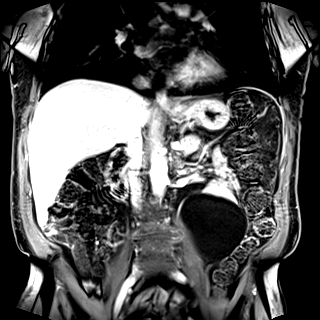
[im 72/72]
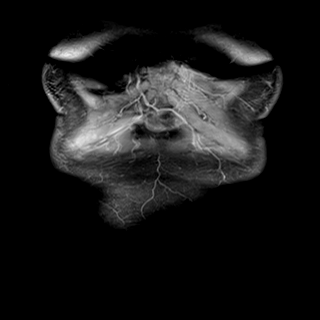

[Series 20: T1 dynamic post-contrast · axial · 3.0mm · 1.25mm/px · z∈[-129,+132]mm · 3 of 88 slices shown (8 of 9)]
[im 1/88]
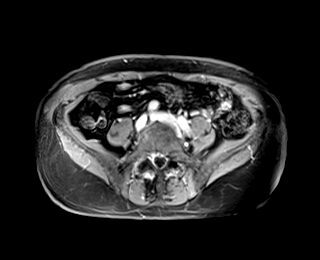
[im 44/88]
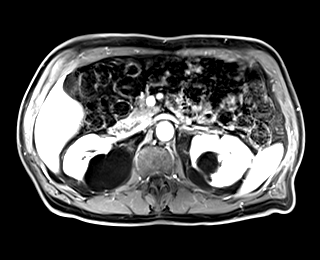
[im 88/88]
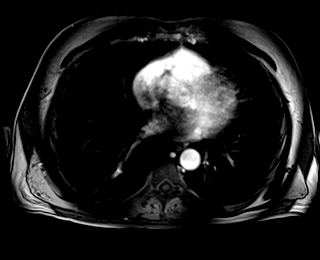

[Series 21: T1 dynamic post-contrast · axial · 3.0mm · 1.25mm/px · z∈[-129,+132]mm · 3 of 88 slices shown (9 of 9)]
[im 1/88]
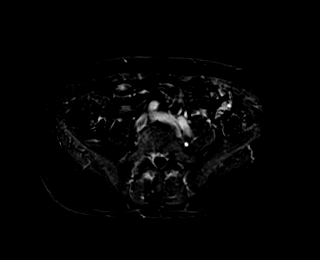
[im 44/88]
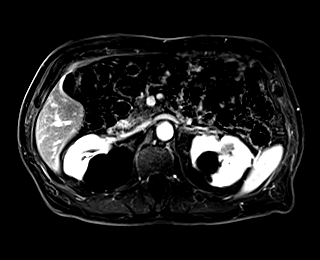
[im 88/88]
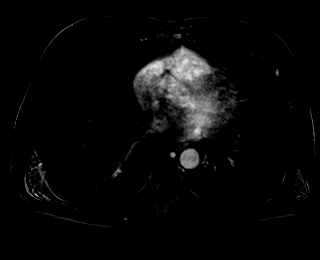

[48 of 48 positions shown; findings below may reference images not displayed]

FINDINGS: Lower chest: No acute abnormality at the lung bases.

Hepatobiliary: Normal liver size and configuration. No hepatic
steatosis. No liver mass. Normal gallbladder with no cholelithiasis.
No biliary ductal dilatation. Common bile duct diameter 2 mm. No
evidence of choledocholithiasis.

Pancreas: No pancreatic mass or duct dilation. There is pancreas
divisum, with drainage of the normal caliber main pancreatic duct
via an accessory duct of [place], with no evidence of
communication with the CBD.

Spleen: Normal size. No mass.

Adrenals/Urinary Tract: Normal adrenals. No hydronephrosis. There
are minimally complex renal cysts in the posterior upper kidneys
bilaterally measuring 7.9 x 5.2 cm on the right (series 7/image 18)
and 5.6 x 4.4 cm on the left (series 7/image 19), both demonstrating
lobulated contours and thin internal septations without solid
enhancement or wall thickening, compatible with Bosniak category 2
renal cysts. There are several subcentimeter T1 hyperintense renal
cortical lesions scattered in the left greater than right kidneys,
too small to accurately characterize, without appreciable
enhancement, most compatible with Bosniak category 2
hemorrhagic/proteinaceous renal cysts. No suspicious renal masses.
Numerous simple renal cysts are scattered in both kidneys, largest
8.3 x 8.1 cm in the lower left kidney.

Stomach/Bowel: Normal non-distended stomach. Visualized small and
large bowel is normal caliber, with no bowel wall thickening.

Vascular/Lymphatic: Atherosclerotic nonaneurysmal abdominal aorta.
Patent portal, splenic, hepatic and renal veins. No pathologically
enlarged lymph nodes in the abdomen.

Other: No abdominal ascites or focal fluid collection.

Musculoskeletal: No aggressive appearing focal osseous lesions.
IMPRESSION: 1. Bosniak category 1 and category 2 renal cysts in both kidneys. No
suspicious renal masses.
2. No acute abnormality.
3. Pancreas divisum.
4.  Aortic Atherosclerosis ([id number]).

## 2020-07-10 ENCOUNTER — Other Ambulatory Visit: Payer: Self-pay | Admitting: Family Medicine

## 2020-08-08 ENCOUNTER — Other Ambulatory Visit: Payer: Self-pay | Admitting: Family Medicine

## 2020-08-09 ENCOUNTER — Other Ambulatory Visit: Payer: Self-pay | Admitting: Family Medicine

## 2020-08-10 NOTE — Telephone Encounter (Signed)
Medication sent in yesterday.

## 2020-08-12 ENCOUNTER — Other Ambulatory Visit: Payer: Self-pay

## 2020-08-12 ENCOUNTER — Ambulatory Visit (INDEPENDENT_AMBULATORY_CARE_PROVIDER_SITE_OTHER)
Admission: RE | Admit: 2020-08-12 | Discharge: 2020-08-12 | Disposition: A | Discharge status: Home / Self Care | Payer: Medicare Other | Source: Ambulatory Visit | Attending: Family Medicine | Admitting: Family Medicine

## 2020-08-12 ENCOUNTER — Ambulatory Visit (INDEPENDENT_AMBULATORY_CARE_PROVIDER_SITE_OTHER): Payer: Medicare Other | Admitting: Family Medicine

## 2020-08-12 ENCOUNTER — Encounter: Payer: Self-pay | Admitting: Family Medicine

## 2020-08-12 VITALS — BP 142/80 | HR 74 | Temp 98.0°F | Ht 67.0 in | Wt 176.5 lb

## 2020-08-12 DIAGNOSIS — M25551 Pain in right hip: Secondary | ICD-10-CM | POA: Diagnosis not present

## 2020-08-12 DIAGNOSIS — M48061 Spinal stenosis, lumbar region without neurogenic claudication: Secondary | ICD-10-CM

## 2020-08-12 DIAGNOSIS — N3281 Overactive bladder: Secondary | ICD-10-CM | POA: Diagnosis not present

## 2020-08-12 DIAGNOSIS — M1610 Unilateral primary osteoarthritis, unspecified hip: Secondary | ICD-10-CM | POA: Diagnosis not present

## 2020-08-12 MED ORDER — MELOXICAM 15 MG PO TABS
15.0000 mg | ORAL_TABLET | Freq: Every day | ORAL | 3 refills | Status: DC
Start: 2020-08-12 — End: 2020-11-11

## 2020-08-12 MED ORDER — PREDNISONE 20 MG PO TABS: ORAL_TABLET | ORAL | 0 refills | Status: DC

## 2020-08-12 MED ORDER — MIRABEGRON ER 25 MG PO TB24
25.0000 mg | ORAL_TABLET | Freq: Every day | ORAL | 3 refills | Status: DC
Start: 2020-08-12 — End: 2020-12-06

## 2020-08-12 NOTE — Progress Notes (Signed)
Telesha Deguzman T. Nicklas Mcsweeney, MD, [facility] [place] [facility] [place], [address]  Phone: [phone]   FAX: [phone]  Adrian Davis - 79 y.o. male   MRN [id number]   Date of Birth: 1941-01-20  Date: 08/12/2020   PCP: [name], MD   Referral: [name], MD  Chief Complaint  Patient presents with   Hip Pain    Right x 3 months    This visit occurred during the SARS-CoV-2 public health emergency.  Safety protocols were in place, including screening questions prior to the visit, additional usage of staff PPE, and extensive cleaning of exam room while observing appropriate contact time as indicated for disinfecting solutions.   Subjective:   Adrian Davis is a 79 y.o. very pleasant male patient with Body mass index is 27.64 kg/m. 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  He presents today with right-sided hip pain that is both anterior as well as posterior.  This is been ongoing for several months per his report as well as his wife's.  He does not have any numbness or tingling as well he has no weakness throughout the leg or in the lower extremity at all on the right.  This is fairly focal.  The only trauma that he has had is about 6 months ago when he fell over some farming equipment, but he did not any have any kind of pain similar to this at that time.  Does have a history of a significant L4-L5 lumbar laminectomy discectomy done in 2015.  Aside from this there is no significant operative intervention, but he has had some ongoing low back pain for some time.  R hip pain:  At least a month or 2 months.   R buttocks pain.  [place] about 6 months ago.  [place] over a equipment.    2015, discectomy.  Taking some mobic.  Has not helped, mobic will help some.   Also   Pain with rotation, socks, truck.    Review of Systems is noted in  the HPI, as appropriate   Objective:   BP (!) 142/80    Pulse 74    Temp 98 F (36.7 C) (Temporal)    Ht 5\' 7"  (1.702 m)    Wt 176 lb 8 oz (80.1 kg)    SpO2 98%    BMI 27.64 kg/m    GEN: No acute distress; alert,appropriate. PULM: Breathing comfortably in no respiratory distress PSYCH: Normally interactive.   Range of motion at  the waist: Flexion: normal Extension: normal Lateral bending: normal Rotation: all normal  No echymosis or edema Rises to examination table with no difficulty Gait: non antalgic  Inspection/Deformity: N Paraspinus Tenderness: Minimal  B Ankle Dorsiflexion (L5,4): 5/5 B Great Toe Dorsiflexion (L5,4): 5/5 Heel Walk (L5): WNL Toe Walk (S1): WNL Rise/Squat (L4): WNL  SENSORY B Medial Foot (L4): WNL B Dorsum (L5): WNL B Lateral (S1): WNL Light Touch: WNL Pinprick: WNL  REFLEXES Knee (L4): 2+ Ankle (S1): 2+  B SLR, seated: neg B SLR, supine: neg B Greater Troch: NT B Log Roll: Painful  B Sciatic Notch: NT   HIP EXAM: SIDE: Right ROM: Abduction, Flexion, Internal and External range of motion: There is pain with abduction, as well as internal and external range of motion with the hip flexed to approximately 80 degrees.  Terminal motion  for internal and external range of motion is a combined approximately 20 degrees. GTB: Very mildly tender SLR: NEG Knees: No effusion FABER: Pain Piriformis: NT at direct palpation Str: flexion: 5/5 abduction: 5/5 adduction: 5/5 Strength testing non-tender  Radiology: DG Hip Unilat W OR W/O Pelvis 2-3 Views Right  Result Date: 08/13/2020 CLINICAL DATA:  Right hip pain. EXAM: DG HIP (WITH OR WITHOUT PELVIS) 2-3V RIGHT COMPARISON:  None. FINDINGS: There is no evidence of hip fracture or dislocation. There is no evidence of arthropathy or other focal bone abnormality. IMPRESSION: Negative. Electronically Signed   By: Virgina Norfolk M.D.   On: 08/13/2020 20:17    See my read below  Assessment and  Plan:     ICD-10-CM   1. Primary localized osteoarthritis of hip  M16.10   2. Acute right hip pain  M25.551 DG Hip Unilat W OR W/O Pelvis 2-3 Views Right  3. Spinal stenosis of lumbar region without neurogenic claudication  M48.061    On my independent x-ray evaluation, the patient does have radiographic evidence of [place] OA in the R hip compared to the L, and this correlates clinically  I believe his hip is exacerbation of ongoing chronic osteoarthritis of the right hip.  We reviewed his plain x-rays face-to-face.  I think it is reasonable to increase his meloxicam dosing to 15 mg daily.  This will be an increase from 7.5 mg.  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  Pulse steroids now to hopefully calm down his recent exacerbation.  Does have some overactive bladder, and he and his wife  Meds ordered this encounter  Medications   meloxicam (MOBIC) 15 MG tablet    Sig: Take 1 tablet (15 mg total) by mouth daily.    Dispense:  30 tablet    Refill:  3   predniSONE (DELTASONE) 20 MG tablet    Sig: 2 tabs po daily for 5 days, then 1 tab po daily for 5 days    Dispense:  15 tablet    Refill:  0   mirabegron ER (MYRBETRIQ) 25 MG TB24 tablet    Sig: Take 1 tablet (25 mg total) by mouth daily.    Dispense:  30 tablet    Refill:  3   Medications Discontinued During This Encounter  Medication Reason   meloxicam (MOBIC) 7.5 MG tablet Reorder   Orders Placed This Encounter  Procedures   DG Hip Unilat W OR W/O Pelvis 2-3 Views Right    Follow-up: No follow-ups on file.  Signed,  Maud Deed. Allaya Abbasi, MD   Outpatient Encounter Medications as of 08/12/2020  Medication Sig   acetaminophen (TYLENOL) 650 MG CR tablet Take 650 mg by mouth every 8 (eight) hours as needed for pain.   amLODipine (NORVASC) 2.5 MG tablet take 2 tablets by mouth every morning and 1 tablet IN THE AFTERNOON AS DIRECTED    aspirin EC 81 MG tablet Take 81 mg by mouth daily.   benazepril (LOTENSIN) 20 MG tablet Take 1 tablet (20 mg total) by mouth 2 (two) times daily.   Cyanocobalamin (B-12) 1000 MCG SUBL Place 1 tablet under the tongue every other day.   diclofenac Sodium (VOLTAREN) 1 % GEL Apply topically daily as needed.   docusate sodium (COLACE) 100 MG capsule Take 1 capsule (100 mg total) by mouth 2 (two) times daily.   famotidine (PEPCID) 20 MG tablet TAKE 1  TABLET(20 MG) BY MOUTH TWICE DAILY   meloxicam (MOBIC) 15 MG tablet Take 1 tablet (15 mg total) by mouth daily.   pantoprazole (PROTONIX) 40 MG tablet Take 1 tablet (40 mg total) by mouth daily.   polyethylene glycol powder (GLYCOLAX/MIRALAX) 17 GM/SCOOP powder Take 255 g by mouth 2 (two) times daily.   sildenafil (REVATIO) 20 MG tablet Take 2 to 5 tablets as needed   simvastatin (ZOCOR) 10 MG tablet Take 1 tablet (10 mg total) by mouth daily at 6 PM.   tamsulosin (FLOMAX) 0.4 MG CAPS capsule Take 1 capsule (0.4 mg total) by mouth daily after supper. Pt has stopped on his own.   [DISCONTINUED] meloxicam (MOBIC) 7.5 MG tablet Take 7.5 mg by mouth daily.   mirabegron ER (MYRBETRIQ) 25 MG TB24 tablet Take 1 tablet (25 mg total) by mouth daily.   potassium chloride (KLOR-CON) 10 MEQ tablet Take 1 tablet (10 mEq total) by mouth daily.   predniSONE (DELTASONE) 20 MG tablet 2 tabs po daily for 5 days, then 1 tab po daily for 5 days   No facility-administered encounter medications on file as of 08/12/2020.

## 2020-08-15 ENCOUNTER — Other Ambulatory Visit: Payer: Self-pay | Admitting: Cardiology

## 2020-08-30 DIAGNOSIS — M4316 Spondylolisthesis, lumbar region: Secondary | ICD-10-CM | POA: Diagnosis not present

## 2020-08-30 DIAGNOSIS — M48061 Spinal stenosis, lumbar region without neurogenic claudication: Secondary | ICD-10-CM | POA: Diagnosis not present

## 2020-08-30 DIAGNOSIS — R1031 Right lower quadrant pain: Secondary | ICD-10-CM | POA: Diagnosis not present

## 2020-08-30 DIAGNOSIS — M5416 Radiculopathy, lumbar region: Secondary | ICD-10-CM | POA: Diagnosis not present

## 2020-09-05 ENCOUNTER — Other Ambulatory Visit: Payer: Self-pay | Admitting: Cardiology

## 2020-09-06 NOTE — Telephone Encounter (Signed)
Rx has been sent to the pharmacy electronically. ° °

## 2020-09-09 DIAGNOSIS — M4316 Spondylolisthesis, lumbar region: Secondary | ICD-10-CM | POA: Diagnosis not present

## 2020-09-09 DIAGNOSIS — M545 Low back pain, unspecified: Secondary | ICD-10-CM | POA: Diagnosis not present

## 2020-09-09 DIAGNOSIS — Z135 Encounter for screening for eye and ear disorders: Secondary | ICD-10-CM | POA: Diagnosis not present

## 2020-09-24 DIAGNOSIS — M48061 Spinal stenosis, lumbar region without neurogenic claudication: Secondary | ICD-10-CM | POA: Diagnosis not present

## 2020-09-24 DIAGNOSIS — M25551 Pain in right hip: Secondary | ICD-10-CM | POA: Diagnosis not present

## 2020-09-24 DIAGNOSIS — M25562 Pain in left knee: Secondary | ICD-10-CM | POA: Diagnosis not present

## 2020-09-24 DIAGNOSIS — M1712 Unilateral primary osteoarthritis, left knee: Secondary | ICD-10-CM | POA: Diagnosis not present

## 2020-09-28 ENCOUNTER — Other Ambulatory Visit: Payer: Self-pay | Admitting: Gastroenterology

## 2020-09-29 ENCOUNTER — Other Ambulatory Visit: Payer: Self-pay | Admitting: Orthopedic Surgery

## 2020-09-29 DIAGNOSIS — M25551 Pain in right hip: Secondary | ICD-10-CM

## 2020-09-30 ENCOUNTER — Ambulatory Visit
Admission: RE | Admit: 2020-09-30 | Discharge: 2020-09-30 | Disposition: A | Discharge status: Home / Self Care | Payer: Medicare Other | Source: Ambulatory Visit | Attending: Orthopedic Surgery | Admitting: Orthopedic Surgery

## 2020-09-30 ENCOUNTER — Other Ambulatory Visit: Payer: Self-pay

## 2020-09-30 DIAGNOSIS — M25451 Effusion, right hip: Secondary | ICD-10-CM | POA: Diagnosis not present

## 2020-09-30 DIAGNOSIS — M25551 Pain in right hip: Secondary | ICD-10-CM

## 2020-09-30 DIAGNOSIS — M1611 Unilateral primary osteoarthritis, right hip: Secondary | ICD-10-CM | POA: Diagnosis not present

## 2020-10-01 DIAGNOSIS — M48061 Spinal stenosis, lumbar region without neurogenic claudication: Secondary | ICD-10-CM | POA: Diagnosis not present

## 2020-10-01 DIAGNOSIS — I10 Essential (primary) hypertension: Secondary | ICD-10-CM | POA: Diagnosis not present

## 2020-10-01 DIAGNOSIS — Z6827 Body mass index (BMI) 27.0-27.9, adult: Secondary | ICD-10-CM | POA: Diagnosis not present

## 2020-10-01 DIAGNOSIS — M5416 Radiculopathy, lumbar region: Secondary | ICD-10-CM | POA: Diagnosis not present

## 2020-10-01 DIAGNOSIS — M4316 Spondylolisthesis, lumbar region: Secondary | ICD-10-CM | POA: Diagnosis not present

## 2020-10-06 DIAGNOSIS — M25551 Pain in right hip: Secondary | ICD-10-CM | POA: Diagnosis not present

## 2020-10-14 DIAGNOSIS — M1611 Unilateral primary osteoarthritis, right hip: Secondary | ICD-10-CM | POA: Diagnosis not present

## 2020-10-14 DIAGNOSIS — M25551 Pain in right hip: Secondary | ICD-10-CM | POA: Diagnosis not present

## 2020-10-19 DIAGNOSIS — Z23 Encounter for immunization: Secondary | ICD-10-CM | POA: Diagnosis not present

## 2020-10-20 ENCOUNTER — Telehealth: Payer: Self-pay

## 2020-10-20 NOTE — Telephone Encounter (Signed)
OK to hold ASA for hip surgery  Irvine Glorioso MD, FACC  

## 2020-10-20 NOTE — Telephone Encounter (Signed)
Dr. Swaz[place],   [name] is a 79 yo male with history of HTN, HLD. You last saw him 05/14/20 and he was doing well. Last Adenosine myoview 2008 and echo January 2012 with gr1DD, mild LVH, normal LVEF. He has no known CAD.   Please comment on holding Aspirin prior to R hip arthroplasty and route response to 'P CV DIV PREOP'.   Thank you,  Adrian Davis

## 2020-10-20 NOTE — Telephone Encounter (Signed)
   [facility] Pre-operative Risk Assessment      Request for surgical clearance:  1. What type of surgery is being performed Right total hip arthroplasty  2. When is this surgery scheduled 11/10/20  3. What type of clearance is required Both  4. Are there any medications that need to be held prior to surgery and how long Aspirin  5. Practice name and name of physician performing surgery [facility] Dr.Frank Aluisio  6. What is the office phone number [phone]   7.   What is the office fax number [phone]  8.   Anesthesia type Choice   Neoma Laming 10/20/2020, 12:53 PM  _________________________________________________________________   (provider comments below)

## 2020-10-20 NOTE — Telephone Encounter (Signed)
   Primary Cardiologist: No primary care provider on file.  Chart reviewed as part of pre-operative protocol coverage. Patient was contacted 10/20/2020 in reference to pre-operative risk assessment for pending surgery as outlined below.  Adrian Davis was last seen on 05/14/20 by Dr. [place].  Since that day, Adrian Davis has done well.  Exercise tolerance >4 METS. No anginal symptoms. Therefore, based on ACC/AHA guidelines, the patient would be at acceptable risk for the planned procedure without further cardiovascular testing.   Per Dr. Swaziland he may hold Aspirin prior to hip surgery. Typical length of hold prior to orthopedic surgery is 5-7 days. Will defer length of hold to Dr. Lequita Halt. Adrian Davis verbalized understanding to inquire about this at his upcoming preop assessment.   The patient was advised that if he develops new symptoms prior to surgery to contact our office to arrange for a follow-up visit, and he verbalized understanding.  I will route this recommendation to the requesting party via Epic fax function and remove from pre-op pool. Please call with questions.  Alver Sorrow, NP 10/20/2020, 3:53 PM

## 2020-10-25 ENCOUNTER — Telehealth: Payer: Self-pay | Admitting: Family Medicine

## 2020-10-25 NOTE — Telephone Encounter (Signed)
Received preop request for upcoming R total hip replacement by Dr Despina Hick on 11/10/20 Pt last seen 03/2019.  plz schedule OV for preop evaluation over the next 2 wks.  He has already received cardiac clearance so no need for EKG.

## 2020-10-25 NOTE — Telephone Encounter (Signed)
Can you call and make app with pt?

## 2020-10-26 DIAGNOSIS — Z23 Encounter for immunization: Secondary | ICD-10-CM | POA: Diagnosis not present

## 2020-11-01 NOTE — H&P (Signed)
TOTAL HIP ADMISSION H&P  Patient is admitted for right total hip arthroplasty.  Subjective:  Chief Complaint: Right hip pain  HPI: Adrian Davis, 80 y.o. male, has a history of pain and functional disability in the right hip due to arthritis and patient has failed non-surgical conservative treatments for greater than 12 weeks to include NSAID's and/or analgesics, corticosteriod injections and viscosupplementation injections. Onset of symptoms was gradual, starting several years ago with gradually worsening course since that time. The patient noted no past surgery on the right hip. Patient currently rates pain in the right hip at 5 out of 10 with activity. Patient has worsening of pain with activity and weight bearing. Patient has evidence of joint space narrowing by imaging studies. This condition presents safety issues increasing the risk of falls. There is no current active infection.  Patient Active Problem List   Diagnosis Date Noted  . Chronic constipation 01/21/2020  . Briles satiety 01/21/2020  . Nephrolithiasis 11/14/2018  . Acquired complex renal cyst 11/14/2018  . Collier Bullock 10/30/2018  . Lower abdominal pain 07/24/2018  . Unintentional weight loss 07/24/2018  . Vitamin B12 deficiency 01/21/2018  . Acute sinusitis 04/13/2017  . Actinic keratosis 01/16/2017  . Medicare annual wellness visit, initial 01/03/2017  . Counseling about travel 01/03/2017  . Advanced care planning/counseling discussion 01/03/2017  . Changing skin lesion 12/25/2016  . Bilateral impacted cerumen 07/25/2016  . Vocal cord leukoplakia   . Obesity, Class I, BMI 30-34.9 02/22/2016  . Paresthesias 02/22/2016  . GERD (gastroesophageal reflux disease)   . Osteoarthritis   . 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  . 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 Hyperlipidemia   . Essential hypertension     Past Medical History:   Diagnosis Date  . Abnormal echocardiogram Jan 2012   grade 1 diastolic dysfunction, normal EF, mild LVH  . Basal cell carcinoma 04/08/2019   left nose inferior to nasal alar groove. EDC: 05/20/2019  . Bell palsy 10/2010   presented with facial numbness  . 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 Degenerative disk disease   . GERD (gastroesophageal reflux disease)   . Hyperlipidemia   . Hypertension    dr [name]  . Lumbar stenosis   . Nephrolithiasis   . Normal nuclear stress test 2008  . Osteoarthritis   . Skin cancer   . Vocal cord leukoplakia    Dr Erik Obey    Past Surgical History:  Procedure Laterality Date  . COLONOSCOPY  04/2019   TAx2, hemorrhoids, rpt 24yrs ([place])  . ESOPHAGOGASTRODUODENOSCOPY  04/2019   erosive gastropathy, biopsy negative for barrett's ([place])  . KNEE ARTHROSCOPY Right   . LUMBAR LAMINECTOMY/DECOMPRESSION MICRODISCECTOMY N/A 11/12/2013   Ophelia Charter, MD - complicated by urinary retention  . SHOULDER SURGERY Bilateral   . TONSILLECTOMY      Prior to Admission medications   Medication Sig Start Date End Date Taking? Authorizing Provider  acetaminophen (TYLENOL) 650 MG CR tablet Take 650 mg by mouth every 8 (eight) hours as needed for pain.   Yes [provider]  amLODipine (NORVASC) 2.5 MG tablet TAKE 2 TABLETS BY MOUTH EVERY MORNING AND 1 IN THE AFTERNOON AS DIRECTED Patient taking differently: Take 2.5-5 mg by mouth See admin instructions. TAKE 2 TABLETS BY MOUTH EVERY MORNING AND 1 IN THE AFTERNOON AS DIRECTED 08/16/20  Yes [place], Peter M, MD  aspirin EC 81 MG  tablet Take 81 mg by mouth daily.   Yes [provider]  benazepril (LOTENSIN) 20 MG tablet TAKE 1 TABLET(20 MG) BY MOUTH TWICE DAILY Patient taking differently: Take 20 mg by mouth 2 (two) times daily. 09/06/20  Yes [place], Peter M, MD  Cyanocobalamin (B-12) 1000 MCG SUBL Place 1 tablet under the tongue every other day. 07/26/18  Yes  Eustaquio BoydenGutierrez, Javier, MD  diclofenac Sodium (VOLTAREN) 1 % GEL Apply 2 g topically daily as needed (pain).   Yes [provider]  diphenhydrAMINE (BENADRYL) 25 MG tablet Take 25 mg by mouth at bedtime.   Yes [provider]  docusate sodium (COLACE) 100 MG capsule Take 1 capsule (100 mg total) by mouth 2 (two) times daily. 06/10/20  Yes [place], Peter M, MD  famotidine (PEPCID) 20 MG tablet TAKE 1 TABLET(20 MG) BY MOUTH TWICE DAILY Patient taking differently: Take 20 mg by mouth at bedtime. 08/09/20  Yes Eustaquio BoydenGutierrez, Javier, MD  meloxicam (MOBIC) 15 MG tablet Take 1 tablet (15 mg total) by mouth daily. 08/12/20  Yes Copland, Karleen HampshireSpencer, MD  mirabegron ER (MYRBETRIQ) 25 MG TB24 tablet Take 1 tablet (25 mg total) by mouth daily. 08/12/20  Yes Copland, Karleen HampshireSpencer, MD  pantoprazole (PROTONIX) 40 MG tablet Take 1 tablet (40 mg total) by mouth daily. 01/20/20  Yes [place], Netty StarringGabriel Jr., MD  polyethylene glycol powder (GLYCOLAX/MIRALAX) 17 GM/SCOOP powder TAKE 17 GRAMS BY MOUTH TWICE DAILY Patient taking differently: Take 17 g by mouth daily. 09/28/20  Yes [place]ouraty, Netty StarringGabriel Jr., MD  potassium chloride (KLOR-CON) 10 MEQ tablet Take 10 mEq by mouth daily.   Yes [provider]  simvastatin (ZOCOR) 10 MG tablet Take 1 tablet (10 mg total) by mouth daily at 6 PM. 05/14/20  Yes [place], Peter M, MD  tamsulosin (FLOMAX) 0.4 MG CAPS capsule Take 1 capsule (0.4 mg total) by mouth daily after supper. Pt has stopped on his own. 05/14/20  Yes [place], Peter M, MD  traMADol (ULTRAM) 50 MG tablet Take 50 mg by mouth every 12 (twelve) hours as needed for severe pain.   Yes [provider]  sildenafil (REVATIO) 20 MG tablet Take 2 to 5 tablets as needed Patient taking differently: Take 40-100 mg by mouth daily as needed (ED). Take 2 to 5 tablets as needed 05/17/20   [place], Peter M, MD    No Known Allergies  Social History   Socioeconomic History  . Marital status: Married    Spouse name: Not  on file  . Number of children: 1  . Years of education: Not on file  . Highest education level: Not on file  Occupational History  . Occupation: farmer  Tobacco Use  . Smoking status: Former Smoker    Packs/day: 1.50    Years: 10.00    Pack years: 15.00    Types: Cigarettes    Quit date: 05/15/1976    Years since quitting: 44.4  . Smokeless tobacco: Current User    Types: Chew  . Tobacco comment: chews tobacco  Vaping Use  . Vaping Use: Never used  Substance and Sexual Activity  . Alcohol use: Yes    Comment: occ  . Drug use: No  . Sexual activity: Not on file  Other Topics Concern  . Not on file  Social History Narrative   Lives with wife   Occ: farm   Edu: HS   Activity: active on farm   Diet: good water, fruits/vegetables daily   Social Determinants of Health  Financial Resource Strain: Not on file  Food Insecurity: Not on file  Transportation Needs: Not on file  Physical Activity: Not on file  Stress: Not on file  Social Connections: Not on file  Intimate Partner Violence: Not on file    Tobacco Use: High Risk  . Smoking Tobacco Use: Former Smoker  . Smokeless Tobacco Use: Current User   Social History   Substance and Sexual Activity  Alcohol Use Yes   Comment: occ    Family History  Problem Relation Age of Onset  . Heart failure Father 9  . Hypertension Father   . Arthritis Father   . Anemia Mother        needed regular transfusions  . Prostate cancer Son   . Cancer Neg Hx   . Diabetes Neg Hx   . Colon cancer Neg Hx   . Rectal cancer Neg Hx   . Stomach cancer Neg Hx   . Inflammatory bowel disease Neg Hx   . Liver disease Neg Hx   . Pancreatic cancer Neg Hx     ROS   Objective:  Physical Exam: Well nourished and well developed.  General: Alert and oriented x3, cooperative and pleasant, no acute distress.  Head: normocephalic, atraumatic, neck supple.  Eyes: EOMI.  Respiratory: breath sounds clear in all fields, no wheezing,  rales, or rhonchi. Cardiovascular: Regular rate and rhythm, no murmurs, gallops or rubs.  Abdomen: non-tender to palpation and soft, normoactive bowel sounds. Musculoskeletal:  The patient has a significantly antalgic gait pattern favoring the right side.    Right Hip Exam:  The range of motion: Flexion to 90 degrees, Internal Rotation to 0 degrees, External Rotation to 10 to 20 degrees, and abduction to 30 degrees without discomfort.  There is no tenderness over the greater trochanteric bursa.    Left Hip Exam:  The range of motion: Normal without discomfort.  There is no tenderness over the greater trochanteric bursa.    The patient's sensation and motor function are intact in their lower extremities. Their distal pulses are 2+. The bilateral calves are soft and non-tender.  Vital signs in last 24 hours: BP: ()/()  Arterial Line BP: ()/()   Imaging Review I reviewed plain x-rays from 07/2020, and he had significant joint space narrowing in the right hip.    CT scan of the right hip dated 09/30/2020 demonstrated bone-on-bone with a large effusion.   Assessment/Plan:  End stage arthritis, right hip  The patient history, physical examination, clinical judgement of the provider and imaging studies are consistent with end stage degenerative joint disease of the right hip and total hip arthroplasty is deemed medically necessary. The treatment options including medical management, injection therapy, arthroscopy and arthroplasty were discussed at length. The risks and benefits of total hip arthroplasty were presented and reviewed. The risks due to aseptic loosening, infection, stiffness, dislocation/subluxation, thromboembolic complications and other imponderables were discussed. The patient acknowledged the explanation, agreed to proceed with the plan and consent was signed. Patient is being admitted for inpatient treatment for surgery, pain control, PT, OT, prophylactic antibiotics, VTE  prophylaxis, progressive ambulation and ADLs and discharge planning.The patient is planning to be discharged home with his wife. .   Patient's anticipated LOS is less than 2 midnights, meeting these requirements: - Lives within 1 hour of care - Has a competent adult at home to recover with post-op recover - NO history of  - Chronic pain requiring opiods  - Diabetes  - Coronary Artery  Disease  - Heart failure  - Heart attack  - Stroke  - DVT/VTE  - Cardiac arrhythmia  - Respiratory Failure/COPD  - Renal failure  - Anemia  - Advanced Liver disease   Therapy Plans: HEP Disposition: Home with Wife Planned DVT Prophylaxis: Xarelto DME Needed: Angela Burke PCP: Dr. Danise Mina (Clearance received) Cardiologist: Clearance Received TXA: IV Allergies: N/A Anesthesia Concerns: N/A BMI: 26.4 Last HgbA1c: N/a  Pharmacy: [facility] in [place]  Other: Hx of inability to urinate following surgeries.  - Patient was instructed on what medications to stop prior to surgery. - Follow-up visit in 2 weeks with Dr. Wynelle Link - Begin physical therapy following surgery - Pre-operative lab work as pre-surgical testing - Prescriptions will be provided in hospital at time of discharge  [name], [facility], PA-C Orthopedic Surgery [facility] [place]

## 2020-11-02 ENCOUNTER — Ambulatory Visit (INDEPENDENT_AMBULATORY_CARE_PROVIDER_SITE_OTHER): Payer: Medicare Other | Admitting: Family Medicine

## 2020-11-02 ENCOUNTER — Encounter: Payer: Self-pay | Admitting: Family Medicine

## 2020-11-02 ENCOUNTER — Ambulatory Visit (INDEPENDENT_AMBULATORY_CARE_PROVIDER_SITE_OTHER)
Admission: RE | Admit: 2020-11-02 | Discharge: 2020-11-02 | Disposition: A | Discharge status: Home / Self Care | Payer: Medicare Other | Source: Ambulatory Visit | Attending: Family Medicine | Admitting: Family Medicine

## 2020-11-02 ENCOUNTER — Other Ambulatory Visit: Payer: Self-pay

## 2020-11-02 VITALS — BP 132/70 | HR 81 | Temp 97.8°F | Ht 67.0 in | Wt 174.3 lb

## 2020-11-02 DIAGNOSIS — N281 Cyst of kidney, acquired: Secondary | ICD-10-CM

## 2020-11-02 DIAGNOSIS — I10 Essential (primary) hypertension: Secondary | ICD-10-CM

## 2020-11-02 DIAGNOSIS — Z720 Tobacco use: Secondary | ICD-10-CM

## 2020-11-02 DIAGNOSIS — M8949 Other hypertrophic osteoarthropathy, multiple sites: Secondary | ICD-10-CM | POA: Diagnosis not present

## 2020-11-02 DIAGNOSIS — K5909 Other constipation: Secondary | ICD-10-CM | POA: Diagnosis not present

## 2020-11-02 DIAGNOSIS — Z01818 Encounter for other preprocedural examination: Secondary | ICD-10-CM

## 2020-11-02 DIAGNOSIS — R1030 Lower abdominal pain, unspecified: Secondary | ICD-10-CM

## 2020-11-02 DIAGNOSIS — Z87898 Personal history of other specified conditions: Secondary | ICD-10-CM

## 2020-11-02 DIAGNOSIS — M159 Polyosteoarthritis, unspecified: Secondary | ICD-10-CM

## 2020-11-02 DIAGNOSIS — R739 Hyperglycemia, unspecified: Secondary | ICD-10-CM | POA: Diagnosis not present

## 2020-11-02 LAB — POC URINALSYSI DIPSTICK (AUTOMATED)
Bilirubin, UA: NEGATIVE
Blood, UA: NEGATIVE
Glucose, UA: NEGATIVE
Ketones, UA: NEGATIVE
Leukocytes, UA: NEGATIVE
Nitrite, UA: NEGATIVE
Protein, UA: NEGATIVE
Spec Grav, UA: >=1.03 — AB (ref 1.010–1.025)
Urobilinogen, UA: 0.2 E.U./dL
pH, UA: 5 (ref 5.0–8.0)

## 2020-11-02 LAB — COMPREHENSIVE METABOLIC PANEL
ALT: 14 U/L (ref 0–53)
AST: 12 U/L (ref 0–37)
Albumin: 4.3 g/dL (ref 3.5–5.2)
Alkaline Phosphatase: 55 U/L (ref 39–117)
BUN: 22 mg/dL (ref 6–23)
CO2: 30 mEq/L (ref 19–32)
Calcium: 9.7 mg/dL (ref 8.4–10.5)
Chloride: 105 mEq/L (ref 96–112)
Creatinine, Ser: 0.74 mg/dL (ref 0.40–1.50)
GFR: 86.83 mL/min (ref >60.00)
Glucose, Bld: 89 mg/dL (ref 70–99)
Potassium: 4 mEq/L (ref 3.5–5.1)
Sodium: 138 mEq/L (ref 135–145)
Total Bilirubin: 0.4 mg/dL (ref 0.2–1.2)
Total Protein: 6.6 g/dL (ref 6.0–8.3)

## 2020-11-02 LAB — CBC WITH DIFFERENTIAL/PLATELET
Basophils Absolute: 0 10*3/uL (ref 0.0–0.1)
Basophils Relative: 0.6 % (ref 0.0–3.0)
Eosinophils Absolute: 0.1 10*3/uL (ref 0.0–0.7)
Eosinophils Relative: 2 % (ref 0.0–5.0)
HCT: 41.6 % (ref 39.0–52.0)
Hemoglobin: 14.1 g/dL (ref 13.0–17.0)
Lymphocytes Relative: 29.2 % (ref 12.0–46.0)
Lymphs Abs: 1.7 10*3/uL (ref 0.7–4.0)
MCHC: 33.8 g/dL (ref 30.0–36.0)
MCV: 92.7 fl (ref 78.0–100.0)
Monocytes Absolute: 0.5 10*3/uL (ref 0.1–1.0)
Monocytes Relative: 9.3 % (ref 3.0–12.0)
Neutro Abs: 3.4 10*3/uL (ref 1.4–7.7)
Neutrophils Relative %: 58.9 % (ref 43.0–77.0)
Platelets: 239 10*3/uL (ref 150.0–400.0)
RBC: 4.49 Mil/uL (ref 4.22–5.81)
RDW: 14.3 % (ref 11.5–15.5)
WBC: 5.8 10*3/uL (ref 4.0–10.5)

## 2020-11-02 LAB — PROTIME-INR
INR: 1 ratio (ref 0.8–1.0)
Prothrombin Time: 11.7 s (ref 9.6–13.1)

## 2020-11-02 LAB — HEMOGLOBIN A1C: Hgb A1c MFr Bld: 5.8 % (ref 4.6–6.5)

## 2020-11-02 MED ORDER — FAMOTIDINE 20 MG PO TABS
20.0000 mg | ORAL_TABLET | Freq: Every day | ORAL | Status: DC
Start: 2020-11-02 — End: 2021-02-08

## 2020-11-02 NOTE — Patient Instructions (Addendum)
Chest xray today.  Labs today.  Urinalysis today.  We will send all results to Dr Wynelle Link for upcoming surgery.  I hope you have a speedy recovery!   Try 1/2 tablet of benadryl to sleep.  Call us with dates of COVID shots and booster.

## 2020-11-02 NOTE — Progress Notes (Signed)
Patient ID: Adrian Davis, male    DOB: [birth year], 80 y.o.   MRN: [id number]  This visit was conducted in person.  BP 132/70 (BP Location: Left Arm, Patient Position: Sitting, Cuff Size: Normal)   Pulse 81   Temp 97.8 F (36.6 C) (Temporal)   Ht 5\' 7"  (1.702 m)   Wt 174 lb 5 oz (79.1 kg)   SpO2 97%   BMI 27.30 kg/m    CC: preop evaluation Subjective:   HPI: Adrian Davis is a 80 y.o. male presenting on 11/02/2020 for Pre-op Exam   Last seen 03/2019. Upcoming R total hip replacement by Dr Wynelle Link scheduled for 11/10/20 under anesthesia of choice. May also need knee and back surgery aftewards.   He has undergone surgeries in the past, including L knee arthroscopy, bilateral shoulder surgeries, and latest lumbar laminectomy 2015. He has tolerated general anesthesia well in the past, he has not had postoperative nausea/vomiting or difficulty awakening. He did have post operative urinary retention after back surgery needing catheterization.   Denies chest pain/tightness, dyspnea, dizziness, headache, palpitations, cough, fevers/chills.   Ongoing episodes of abdominal pain - previous unrevealing workup attributed to constipation - managed with miralax 17 gm daily and colace 100mg  bid. 1 soft stool/day. He is on benadryl 25mg  nightly and tramadol 50mg  nightly.   Poor sleep due to pain in h/o insomnia managed with benadryl 25mg  nightly.   He recently received cardiac clearance from his cardiologist for upcoming surgery (10/20/2020). He may hold aspirin prior to his hip surgery per surgeon's discretion.   Remote smoker, quit 1970s. Chews tobacco.  Known renal cyst previously followed by urology - last imaging 01/2019.  Ongoing urinary frequency/urgency despite myrbetriq.      Relevant past medical, surgical, family and social history reviewed and updated as indicated. Interim medical history since our last visit reviewed. Allergies and medications reviewed and updated. Outpatient  Medications Prior to Visit  Medication Sig Dispense Refill  . acetaminophen (TYLENOL) 650 MG CR tablet Take 650 mg by mouth every 8 (eight) hours as needed for pain.    Marland Kitchen amLODipine (NORVASC) 2.5 MG tablet TAKE 2 TABLETS BY MOUTH EVERY MORNING AND 1 IN THE AFTERNOON AS DIRECTED (Patient taking differently: Take 2.5-5 mg by mouth See admin instructions. TAKE 2 TABLETS BY MOUTH EVERY MORNING AND 1 IN THE AFTERNOON AS DIRECTED) 270 tablet 3  . aspirin EC 81 MG tablet Take 81 mg by mouth daily.    . benazepril (LOTENSIN) 20 MG tablet TAKE 1 TABLET(20 MG) BY MOUTH TWICE DAILY (Patient taking differently: Take 20 mg by mouth 2 (two) times daily.) 180 tablet 2  . Cyanocobalamin (B-12) 1000 MCG SUBL Place 1 tablet under the tongue every other day. 30 each   . diclofenac Sodium (VOLTAREN) 1 % GEL Apply 2 g topically daily as needed (pain).    Marland Kitchen diphenhydrAMINE (BENADRYL) 25 MG tablet Take 25 mg by mouth at bedtime.    . docusate sodium (COLACE) 100 MG capsule Take 1 capsule (100 mg total) by mouth 2 (two) times daily. 180 capsule 3  . meloxicam (MOBIC) 15 MG tablet Take 1 tablet (15 mg total) by mouth daily. 30 tablet 3  . mirabegron ER (MYRBETRIQ) 25 MG TB24 tablet Take 1 tablet (25 mg total) by mouth daily. 30 tablet 3  . pantoprazole (PROTONIX) 40 MG tablet Take 1 tablet (40 mg total) by mouth daily. 90 tablet 3  . polyethylene glycol powder (GLYCOLAX/MIRALAX) 17 GM/SCOOP powder TAKE  [place] (Patient taking differently: Take 17 g by mouth daily.) 510 g 2  . potassium chloride (KLOR-CON) 10 MEQ tablet Take 10 mEq by mouth daily.    . sildenafil (REVATIO) 20 MG tablet Take 2 to 5 tablets as needed (Patient taking differently: Take 40-100 mg by mouth daily as needed (ED). Take 2 to 5 tablets as needed) 50 tablet 3  . simvastatin (ZOCOR) 10 MG tablet Take 1 tablet (10 mg total) by mouth daily at 6 PM. 90 tablet 3  . tamsulosin (FLOMAX) 0.4 MG CAPS capsule Take 1 capsule (0.4 mg total) by  mouth daily after supper. Pt has stopped on his own. 90 capsule 3  . traMADol (ULTRAM) 50 MG tablet Take 50 mg by mouth every 12 (twelve) hours as needed for severe pain.    . famotidine (PEPCID) 20 MG tablet TAKE 1 TABLET(20 MG) BY MOUTH TWICE DAILY (Patient taking differently: Take 20 mg by mouth at bedtime.) 60 tablet 0   No facility-administered medications prior to visit.     Per HPI unless specifically indicated in ROS section below Review of Systems Objective:  BP 132/70 (BP Location: Left Arm, Patient Position: Sitting, Cuff Size: Normal)   Pulse 81   Temp 97.8 F (36.6 C) (Temporal)   Ht 5\' 7"  (1.702 m)   Wt 174 lb 5 oz (79.1 kg)   SpO2 97%   BMI 27.30 kg/m   Wt Readings from Last 3 Encounters:  11/02/20 174 lb 5 oz (79.1 kg)  08/12/20 176 lb 8 oz (80.1 kg)  05/14/20 179 lb (81.2 kg)      Physical Exam Vitals and nursing note reviewed.  Constitutional:      Appearance: Normal appearance. He is not ill-appearing.  Cardiovascular:     Rate and Rhythm: Normal rate and regular rhythm.     Pulses: Normal pulses.     Heart sounds: Normal heart sounds. No murmur heard.   Pulmonary:     Effort: Pulmonary effort is normal. No respiratory distress.     Breath sounds: Normal breath sounds. No wheezing, rhonchi or rales.  Abdominal:     General: Abdomen is flat. Bowel sounds are normal. There is no distension.     Palpations: Abdomen is soft. There is no mass.     Tenderness: There is no abdominal tenderness. There is no guarding or rebound.     Hernia: No hernia is present.  Musculoskeletal:     Right lower leg: No edema.     Left lower leg: No edema.  Skin:    General: Skin is warm and dry.     Findings: No rash.  Neurological:     Mental Status: He is alert.  Psychiatric:        Mood and Affect: Mood normal.        Behavior: Behavior normal.       Results for orders placed or performed in visit on 11/02/20  CBC with Differential/Platelet  Result Value Ref  Range   WBC 5.8 4.0 - 10.5 K/uL   RBC 4.49 4.22 - 5.81 Mil/uL   Hemoglobin 14.1 13.0 - 17.0 g/dL   HCT 41.6 39.0 - 52.0 %   MCV 92.7 78.0 - 100.0 fl   MCHC 33.8 30.0 - 36.0 g/dL   RDW 14.3 11.5 - 15.5 %   Platelets 239.0 150.0 - 400.0 K/uL   Neutrophils Relative % 58.9 43.0 - 77.0 %   Lymphocytes Relative 29.2 12.0 -  46.0 %   Monocytes Relative 9.3 3.0 - 12.0 %   Eosinophils Relative 2.0 0.0 - 5.0 %   Basophils Relative 0.6 0.0 - 3.0 %   Neutro Abs 3.4 1.4 - 7.7 K/uL   Lymphs Abs 1.7 0.7 - 4.0 K/uL   Monocytes Absolute 0.5 0.1 - 1.0 K/uL   Eosinophils Absolute 0.1 0.0 - 0.7 K/uL   Basophils Absolute 0.0 0.0 - 0.1 K/uL  Comprehensive metabolic panel  Result Value Ref Range   Sodium 138 135 - 145 mEq/L   Potassium 4.0 3.5 - 5.1 mEq/L   Chloride 105 96 - 112 mEq/L   CO2 30 19 - 32 mEq/L   Glucose, Bld 89 70 - 99 mg/dL   BUN 22 6 - 23 mg/dL   Creatinine, Ser 0.74 0.40 - 1.50 mg/dL   Total Bilirubin 0.4 0.2 - 1.2 mg/dL   Alkaline Phosphatase 55 39 - 117 U/L   AST 12 0 - 37 U/L   ALT 14 0 - 53 U/L   Total Protein 6.6 6.0 - 8.3 g/dL   Albumin 4.3 3.5 - 5.2 g/dL   GFR 86.83 >60.00 mL/min   Calcium 9.7 8.4 - 10.5 mg/dL  Protime-INR  Result Value Ref Range   INR 1.0 0.8 - 1.0 ratio   Prothrombin Time 11.7 9.6 - 13.1 sec  Hemoglobin A1c  Result Value Ref Range   Hgb A1c MFr Bld 5.8 4.6 - 6.5 %  POCT Urinalysis Dipstick (Automated)  Result Value Ref Range   Color, UA yellow    Clarity, UA clear    Glucose, UA Negative Negative   Bilirubin, UA negative    Ketones, UA negative    Spec Grav, UA >=1.030 (A) 1.010 - 1.025   Blood, UA negative    pH, UA 5.0 5.0 - 8.0   Protein, UA Negative Negative   Urobilinogen, UA 0.2 0.2 or 1.0 E.U./dL   Nitrite, UA negative    Leukocytes, UA Negative Negative   Lab Results  Component Value Date   VITAMINB12 917 03/06/2019    DG Chest 2 View CLINICAL DATA:  Preop evaluation  EXAM: CHEST - 2 VIEW  COMPARISON:   None.  FINDINGS: The heart size and mediastinal contours are within normal limits. Both lungs are clear. The visualized skeletal structures are unremarkable.  IMPRESSION: No active cardiopulmonary disease.  Electronically Signed   By: Miachel Roux M.D.   On: 11/02/2020 10:58   Assessment & Plan:  This visit occurred during the SARS-CoV-2 public health emergency.  Safety protocols were in place, including screening questions prior to the visit, additional usage of staff PPE, and extensive cleaning of exam room while observing appropriate contact time as indicated for disinfecting solutions.   Problem List Items Addressed This Visit    Smokeless tobacco use   Pre-op evaluation - Primary    He has already received cardiac clearance and is able to hold aspirin prior to surgery.  Will check CXR, labs and urinalysis.  Anticipate adequately low risk to proceed with planned hip replacement surgery.       Relevant Orders   DG Chest 2 View (Completed)   CBC with Differential/Platelet (Completed)   Comprehensive metabolic panel (Completed)   Protime-INR (Completed)   Hemoglobin A1c (Completed)   POCT Urinalysis Dipstick (Automated) (Completed)   Osteoarthritis   Lower abdominal pain    Ongoing intermittent flares of lower abdominal pain attributed to constipation s/p reassuring workup last year. See below.  History of urinary retention    H/o postop urinary retention after spine surgery 2015, needed catheterization.  Recommend taper down on benadryl and tramadol use to hopefully prevent recurrent issue.       Essential hypertension    Chronic, stable on current regimen - amlodipine, benazepril.       Chronic constipation    Reviewed current bowel regimen which is miralax 17gm daily to BID and colace stool softener daily. Reviewed medications that could contribute to constipation - namely benadryl and tramadol. Recommend he try to diminish use of both. Discussed option to try  linzess, he will let me know if desired.       Acquired complex renal cyst    Bilateral, benign appearing, previously followed by urology. Last imaging done 01/2019.        Other Visit Diagnoses    Hyperglycemia       Relevant Orders   Hemoglobin A1c (Completed)   Lower abdominal pain, unspecified        Relevant Orders   Protime-INR (Completed)   POCT Urinalysis Dipstick (Automated) (Completed)       Meds ordered this encounter  Medications  . famotidine (PEPCID) 20 MG tablet    Sig: Take 1 tablet (20 mg total) by mouth at bedtime.   Orders Placed This Encounter  Procedures  . DG Chest 2 View    Standing Status:   Future    Number of Occurrences:   1    Standing Expiration Date:   11/02/2021    Order Specific Question:   Reason for Exam (SYMPTOM  OR DIAGNOSIS REQUIRED)    Answer:   preop eval    Order Specific Question:   Preferred imaging location?    Answer:   [facility]  . CBC with Differential/Platelet  . Comprehensive metabolic panel  . Protime-INR  . Hemoglobin A1c  . POCT Urinalysis Dipstick (Automated)    Patient Instructions  Chest xray today.  Labs today.  Urinalysis today.  We will send all results to Dr Wynelle Link for upcoming surgery.  I hope you have a speedy recovery!   Try 1/2 tablet of benadryl to sleep.  Call us with dates of COVID shots and booster.    Follow up plan: Return if symptoms worsen or fail to improve.  [name], MD

## 2020-11-04 DIAGNOSIS — Z87898 Personal history of other specified conditions: Secondary | ICD-10-CM | POA: Insufficient documentation

## 2020-11-04 DIAGNOSIS — Z720 Tobacco use: Secondary | ICD-10-CM | POA: Insufficient documentation

## 2020-11-04 DIAGNOSIS — Z01818 Encounter for other preprocedural examination: Secondary | ICD-10-CM | POA: Insufficient documentation

## 2020-11-04 NOTE — Patient Instructions (Addendum)
DUE TO COVID-19 ONLY ONE VISITOR IS ALLOWED TO COME WITH YOU AND STAY IN THE WAITING ROOM ONLY DURING PRE OP AND PROCEDURE.   IF YOU WILL BE ADMITTED INTO THE HOSPITAL YOU ARE ALLOWED ONE SUPPORT PERSON DURING VISITATION HOURS ONLY (10AM -8PM)   . The support person may change daily. . The support person must pass our screening, gel in and out, and wear a mask at all times, including in the patient's room. . Patients must also wear a mask when staff or their support person are in the room.   COVID SWAB TESTING MUST BE COMPLETED ON:   Saturday, 11-06-20 @10 :20 AM   [address] [place], [place] [address]  (Must self quarantine after testing. Follow instructions on handout.)     Your procedure is scheduled on:  Wednesday, 11-10-20   Report to [facility] Main  Entrance   Report to admitting at 8:50 AM   Call this number if you have problems the morning of surgery [phone]   Do not eat food :After Midnight.   May have liquids until 8:15 AM day of surgery  CLEAR LIQUID DIET  Foods Allowed                                                                     Foods Excluded  Water, Black Coffee and tea, regular and decaf             liquids that you cannot  Plain Jell-O in any flavor  (No red)                                   see through such as: Fruit ices (not with fruit pulp)                                      milk, soups, orange juice              Iced Popsicles (No red)                                      All solid food                                   Apple juices Sports drinks like Gatorade (No red) Lightly seasoned clear broth or consume(fat free) Sugar, honey syrup      Complete one Ensure drink the morning of surgery at  8:15 AM the day of surgery.       1. The day of surgery:  ? Drink ONE (1) Pre-Surgery Clear Ensure or G2 by am the morning of surgery. Drink in one sitting. Do not sip.  ? This drink was given to you during your hospital  pre-op  appointment visit. ? Nothing else to drink after completing the  Pre-Surgery Clear Ensure or G2.          If you have questions, please contact your surgeon's  office.     Oral Hygiene is also important to reduce your risk of infection.                                    Remember - BRUSH YOUR TEETH THE MORNING OF SURGERY WITH YOUR REGULAR TOOTHPASTE   Do NOT smoke after Midnight   Take these medicines the morning of surgery with A SIP OF WATER:  Amlodipine, Pantoprazole                                You may not have any metal on your body including jewelry, and body piercings             Do not wear  lotions, powders, perfumes/cologne, or deodorant             Men may shave face and neck.   Do not bring valuables to the hospital. [facility].   Contacts, dentures or bridgework may not be worn into surgery.   Bring small overnight bag day of surgery.      Special Instructions: Bring a copy of your healthcare power of attorney and living will documents         the day of surgery if you haven't scanned them  in before.              Please read over the following fact sheets you were given: IF YOU HAVE QUESTIONS ABOUT YOUR PRE OP INSTRUCTIONS PLEASE CALL [phone]   [facility] - Preparing for Surgery Before surgery, you can play an important role.  Because skin is not sterile, your skin needs to be as free of germs as possible.  You can reduce the number of germs on your skin by washing with CHG (chlorahexidine gluconate) soap before surgery.  CHG is an antiseptic cleaner which kills germs and bonds with the skin to continue killing germs even after washing. Please DO NOT use if you have an allergy to CHG or antibacterial soaps.  If your skin becomes reddened/irritated stop using the CHG and inform your nurse when you arrive at Short Stay. Do not shave (including legs and underarms) for at least 48 hours prior to the first CHG shower.  You may  shave your face/neck.  Please follow these instructions carefully:  1.  Shower with CHG Soap the night before surgery and the  morning of surgery.  2.  If you choose to wash your hair, wash your hair first as usual with your normal  shampoo.  3.  After you shampoo, rinse your hair and body thoroughly to remove the shampoo.                             4.  Use CHG as you would any other liquid soap.  You can apply chg directly to the skin and wash.  Gently with a scrungie or clean washcloth.  5.  Apply the CHG Soap to your body ONLY FROM THE NECK DOWN.   Do   not use on face/ open                           Wound or open sores. Avoid contact with eyes, ears mouth and  genitals (private parts).                       Wash face,  Genitals (private parts) with your normal soap.             6.  Wash thoroughly, paying special attention to the area where your    surgery  will be performed.  7.  Thoroughly rinse your body with warm water from the neck down.  8.  DO NOT shower/wash with your normal soap after using and rinsing off the CHG Soap.                9.  Pat yourself dry with a clean towel.            10.  Wear clean pajamas.            11.  Place clean sheets on your bed the night of your first shower and do not  sleep with pets. Day of Surgery : Do not apply any lotions/deodorants the morning of surgery.  Please wear clean clothes to the hospital/surgery center.  FAILURE TO FOLLOW THESE INSTRUCTIONS MAY RESULT IN THE CANCELLATION OF YOUR SURGERY  PATIENT SIGNATURE_________________________________  NURSE SIGNATURE__________________________________  ________________________________________________________________________   Adrian Davis  An incentive spirometer is a tool that can help keep your lungs clear and active. This tool measures how well you are filling your lungs with each breath. Taking long deep breaths may help reverse or decrease the chance of developing breathing  (pulmonary) problems (especially infection) following:  A long period of time when you are unable to move or be active. BEFORE THE PROCEDURE   If the spirometer includes an indicator to show your best effort, your nurse or respiratory therapist will set it to a desired goal.  If possible, sit up straight or lean slightly forward. Try not to slouch.  Hold the incentive spirometer in an upright position. INSTRUCTIONS FOR USE  1. Sit on the edge of your bed if possible, or sit up as far as you can in bed or on a chair. 2. Hold the incentive spirometer in an upright position. 3. Breathe out normally. 4. Place the mouthpiece in your mouth and seal your lips tightly around it. 5. Breathe in slowly and as deeply as possible, raising the piston or the ball toward the top of the column. 6. Hold your breath for 3-5 seconds or for as long as possible. Allow the piston or ball to fall to the bottom of the column. 7. Remove the mouthpiece from your mouth and breathe out normally. 8. Rest for a few seconds and repeat Steps 1 through 7 at least 10 times every 1-2 hours when you are awake. Take your time and take a few normal breaths between deep breaths. 9. The spirometer may include an indicator to show your best effort. Use the indicator as a goal to work toward during each repetition. 10. After each set of 10 deep breaths, practice coughing to be sure your lungs are clear. If you have an incision (the cut made at the time of surgery), support your incision when coughing by placing a pillow or rolled up towels firmly against it. Once you are able to get out of bed, walk around indoors and cough well. You may stop using the incentive spirometer when instructed by your caregiver.  RISKS AND COMPLICATIONS  Take your time so you do not get dizzy or light-headed.  If you are in  pain, you may need to take or ask for pain medication before doing incentive spirometry. It is harder to take a deep breath if you  are having pain. AFTER USE  Rest and breathe slowly and easily.  It can be helpful to keep track of a log of your progress. Your caregiver can provide you with a simple table to help with this. If you are using the spirometer at home, follow these instructions: [place] IF:   You are having difficultly using the spirometer.  You have trouble using the spirometer as often as instructed.  Your pain medication is not giving enough relief while using the spirometer.  You develop fever of 100.5 F (38.1 C) or higher. SEEK IMMEDIATE MEDICAL CARE IF:   You cough up bloody sputum that had not been present before.  You develop fever of 102 F (38.9 C) or greater.  You develop worsening pain at or near the incision site. MAKE SURE YOU:   Understand these instructions.  Will watch your condition.  Will get help right away if you are not doing well or get worse. Document Released: 02/19/2007 Document Revised: 01/01/2012 Document Reviewed: 04/22/2007 ExitCare Patient Information 2014 ExitCare, [place].   ________________________________________________________________________  WHAT IS A BLOOD TRANSFUSION? Blood Transfusion Information  A transfusion is the replacement of blood or some of its parts. Blood is made up of multiple cells which provide different functions.  Red blood cells carry oxygen and are used for blood loss replacement.  White blood cells fight against infection.  Platelets control bleeding.  Plasma helps clot blood.  Other blood products are available for specialized needs, such as hemophilia or other clotting disorders. BEFORE THE TRANSFUSION  Who gives blood for transfusions?   Healthy volunteers who are fully evaluated to make sure their blood is safe. This is blood bank blood. Transfusion therapy is the safest it has ever been in the practice of medicine. Before blood is taken from a donor, a complete history is taken to make sure that person has  no history of diseases nor engages in risky social behavior (examples are intravenous drug use or sexual activity with multiple partners). The donor's travel history is screened to minimize risk of transmitting infections, such as malaria. The donated blood is tested for signs of infectious diseases, such as HIV and hepatitis. The blood is then tested to be sure it is compatible with you in order to minimize the chance of a transfusion reaction. If you or a relative donates blood, this is often done in anticipation of surgery and is not appropriate for emergency situations. It takes many days to process the donated blood. RISKS AND COMPLICATIONS Although transfusion therapy is very safe and saves many lives, the main dangers of transfusion include:   Getting an infectious disease.  Developing a transfusion reaction. This is an allergic reaction to something in the blood you were given. Every precaution is taken to prevent this. The decision to have a blood transfusion has been considered carefully by your caregiver before blood is given. Blood is not given unless the benefits outweigh the risks. AFTER THE TRANSFUSION  Right after receiving a blood transfusion, you will usually feel much better and more energetic. This is especially true if your red blood cells have gotten low (anemic). The transfusion raises the level of the red blood cells which carry oxygen, and this usually causes an energy increase.  The nurse administering the transfusion will monitor you carefully for complications. HOME CARE INSTRUCTIONS  No special instructions are needed after a transfusion. You may find your energy is better. Speak with your caregiver about any limitations on activity for underlying diseases you may have. SEEK MEDICAL CARE IF:   Your condition is not improving after your transfusion.  You develop redness or irritation at the intravenous (IV) site. SEEK IMMEDIATE MEDICAL CARE IF:  Any of the following  symptoms occur over the next 12 hours:  Shaking chills.  You have a temperature by mouth above 102 F (38.9 C), not controlled by medicine.  Chest, back, or muscle pain.  People around you feel you are not acting correctly or are confused.  Shortness of breath or difficulty breathing.  Dizziness and fainting.  You get a rash or develop hives.  You have a decrease in urine output.  Your urine turns a dark color or changes to pink, red, or brown. Any of the following symptoms occur over the next 10 days:  You have a temperature by mouth above 102 F (38.9 C), not controlled by medicine.  Shortness of breath.  Weakness after normal activity.  The white part of the eye turns yellow (jaundice).  You have a decrease in the amount of urine or are urinating less often.  Your urine turns a dark color or changes to pink, red, or brown. Document Released: 10/06/2000 Document Revised: 01/01/2012 Document Reviewed: 05/25/2008 [facility] Patient Information 2014 [place], [place].  _______________________________________________________________________

## 2020-11-04 NOTE — Assessment & Plan Note (Signed)
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  Recommend taper down on benadryl and tramadol use to hopefully prevent recurrent issue.

## 2020-11-04 NOTE — Progress Notes (Addendum)
COVID Vaccine Completed:  x3 Date COVID Vaccine completed:  10-2020 Booster COVID vaccine manufacturer: [employer]   PCP - [name], MD Cardiologist - [name], MD  Cardiac clearance in chart dated 10-20-20 by Laurann Montana, NP  Medical clearance on chart dated 11-03-20 from Dr. Danise Mina.  Chest x-ray - 11-02-20 in Epic EKG - 05-14-20 in Epic Stress Test - 07-18-07 in Epic ECHO - 2012? Cardiac Cath -  Pacemaker/ICD device last checked:  Sleep Study - N/A CPAP -   Fasting Blood Sugar - N/A Checks Blood Sugar _____ times a day  Blood Thinner Instructions: Aspirin Instructions:  ASA 81 mg.  Ok to hold for 5-7 days per cardiac clearance dated 10-20-20 Last Dose:  11-04-20  Anesthesia review:  Grade 1 diastolic dysfunction, mild LVH.  Patient denies shortness of breath, fever, cough and chest pain at PAT appointment.  Pt able to climb a flight of stairs slowly due to hip pain.  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   Patient verbalized understanding of instructions that were given to them at the PAT appointment. Patient was also instructed that they will need to review over the PAT instructions again at home before surgery.

## 2020-11-04 NOTE — Assessment & Plan Note (Signed)
Reviewed current bowel regimen which is miralax 17gm daily to BID and colace stool softener daily. Reviewed medications that could contribute to constipation - namely benadryl and tramadol. Recommend he try to diminish use of both. Discussed option to try linzess, he will let me know if desired.

## 2020-11-04 NOTE — Assessment & Plan Note (Signed)
Ongoing intermittent flares of lower abdominal pain attributed to constipation s/p reassuring workup last year. See below.

## 2020-11-04 NOTE — Assessment & Plan Note (Signed)
Chronic, stable on current regimen - amlodipine, benazepril.

## 2020-11-04 NOTE — Assessment & Plan Note (Addendum)
He has already received cardiac clearance and is able to hold aspirin prior to surgery.  Will check CXR, labs and urinalysis.  Anticipate adequately low risk to proceed with planned hip replacement surgery.

## 2020-11-04 NOTE — Assessment & Plan Note (Signed)
Bilateral, benign appearing, previously followed by urology. Last imaging done 01/2019.

## 2020-11-05 ENCOUNTER — Encounter (HOSPITAL_COMMUNITY): Payer: Self-pay

## 2020-11-05 ENCOUNTER — Other Ambulatory Visit: Payer: Self-pay

## 2020-11-05 ENCOUNTER — Encounter (HOSPITAL_COMMUNITY)
Admission: RE | Admit: 2020-11-05 | Discharge: 2020-11-05 | Disposition: A | Discharge status: Home / Self Care | Payer: Medicare Other | Source: Ambulatory Visit | Attending: Orthopedic Surgery | Admitting: Orthopedic Surgery

## 2020-11-05 DIAGNOSIS — Z01812 Encounter for preprocedural laboratory examination: Secondary | ICD-10-CM | POA: Insufficient documentation

## 2020-11-05 HISTORY — DX: Personal history of urinary calculi: Z87.442

## 2020-11-05 HISTORY — DX: Cyst of kidney, acquired: N28.1

## 2020-11-05 HISTORY — DX: Anemia, unspecified: D64.9

## 2020-11-05 LAB — SURGICAL PCR SCREEN
MRSA, PCR: NEGATIVE
Staphylococcus aureus: NEGATIVE

## 2020-11-05 LAB — APTT: aPTT: 34 seconds (ref 24–36)

## 2020-11-05 NOTE — Telephone Encounter (Signed)
Faxed form to [facility].

## 2020-11-06 ENCOUNTER — Other Ambulatory Visit (HOSPITAL_COMMUNITY)
Admission: RE | Admit: 2020-11-06 | Discharge: 2020-11-06 | Disposition: A | Discharge status: Home / Self Care | Payer: Medicare Other | Source: Ambulatory Visit | Attending: Orthopedic Surgery | Admitting: Orthopedic Surgery

## 2020-11-06 DIAGNOSIS — Z20822 Contact with and (suspected) exposure to covid-19: Secondary | ICD-10-CM | POA: Diagnosis not present

## 2020-11-06 DIAGNOSIS — Z01812 Encounter for preprocedural laboratory examination: Secondary | ICD-10-CM | POA: Insufficient documentation

## 2020-11-06 LAB — SARS CORONAVIRUS 2 (TAT 6-24 HRS): SARS Coronavirus 2: NEGATIVE

## 2020-11-09 NOTE — Progress Notes (Signed)
Anesthesia Chart Review   Case: [id number] Date/Time: 11/10/20 1105   Procedure: TOTAL HIP ARTHROPLASTY ANTERIOR APPROACH (Right Hip) - 19min   Anesthesia type: Choice   Pre-op diagnosis: right hip osteoarthritis   Location: [facility] ROOM 10 / [facility]   Surgeons: [name], MD      DISCUSSION:80 y.o. former smoker with h/o HTN, GERD, right hip OA scheduled for above procedure 11/10/2020 with Dr. [name].   Pt last seen by PCP 11/04/2020. Per note, "He has already received cardiac clearance and is able to hold aspirin prior to surgery.  Will check CXR, labs and urinalysis.  Anticipate adequately low risk to proceed with planned hip replacement surgery."  Per cardiology preoperative assessment 10/20/2020, "Chart reviewed as part of pre-operative protocol coverage. Patient was contacted 10/20/2020 in reference to pre-operative risk assessment for pending surgery as outlined below.  Zaccheus Edmister Bound was last seen on 05/14/20 by Dr. Martinique.  Since that day, RHYLEN SHAHEEN has done well. Exercise tolerance >4 METS. No anginal symptoms. Therefore, based on ACC/AHA guidelines, the patient would be at acceptable risk for the planned procedure without further cardiovascular testing.  Per Dr. Martinique he may hold Aspirin prior to hip surgery. Typical length of hold prior to orthopedic surgery is 5-7 days. Will defer length of hold to Dr. Wynelle Link. Mr. Molina verbalized understanding to inquire about this at his upcoming preop assessment."  Anticipate pt can proceed with planned procedure barring acute status change.   VS: BP (!) 150/78   Pulse 83   Temp 36.6 C (Oral)   Resp 16   Ht 5\' 8"  (1.727 m)   Wt 78.9 kg   SpO2 100%   BMI 26.46 kg/m   PROVIDERS: [name], MD is PCP   Martinique, Peter, MD is Cardiologist  LABS: Labs reviewed: Acceptable for surgery. (all labs ordered are listed, but only abnormal results are displayed)  Labs Reviewed  SURGICAL PCR SCREEN  APTT  TYPE AND SCREEN      IMAGES:   EKG: 05/14/2020 Rate 65 bpm  NSR Minimal voltage criteria for LVH, may be normal variant Nonspecific T wave abnormality   CV:  Past Medical History:  Diagnosis Date  . Abnormal echocardiogram Jan 2012   grade 1 diastolic dysfunction, normal EF, mild LVH  . Anemia   . Basal cell carcinoma 04/08/2019   left nose inferior to nasal alar groove. EDC: 05/20/2019  . Bell palsy 10/2010   presented with facial numbness  . 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 Degenerative disk disease   . GERD (gastroesophageal reflux disease)   . History of kidney stones   . Hyperlipidemia   . Hypertension    dr [name]  . Kidney cysts   . Lumbar stenosis   . Nephrolithiasis   . Normal nuclear stress test 2008  . Osteoarthritis   . Skin cancer   . Vocal cord leukoplakia    Dr Erik Obey    Past Surgical History:  Procedure Laterality Date  . COLONOSCOPY  04/2019   TAx2, hemorrhoids, rpt 52yrs ([place])  . ESOPHAGOGASTRODUODENOSCOPY  04/2019   erosive gastropathy, biopsy negative for barrett's ([place])  . KNEE ARTHROSCOPY Right   . LUMBAR LAMINECTOMY/DECOMPRESSION MICRODISCECTOMY N/A 11/12/2013   Ophelia Charter, MD - complicated by urinary retention  . SHOULDER SURGERY Bilateral   . TONSILLECTOMY      MEDICATIONS: . acetaminophen (TYLENOL) 650 MG CR tablet  . amLODipine (NORVASC) 2.5 MG tablet  .  aspirin EC 81 MG tablet  . benazepril (LOTENSIN) 20 MG tablet  . Cyanocobalamin (B-12) 1000 MCG SUBL  . diclofenac Sodium (VOLTAREN) 1 % GEL  . diphenhydrAMINE (BENADRYL) 25 MG tablet  . docusate sodium (COLACE) 100 MG capsule  . famotidine (PEPCID) 20 MG tablet  . meloxicam (MOBIC) 15 MG tablet  . mirabegron ER (MYRBETRIQ) 25 MG TB24 tablet  . pantoprazole (PROTONIX) 40 MG tablet  . polyethylene glycol powder (GLYCOLAX/MIRALAX) 17 GM/SCOOP powder  . potassium chloride (KLOR-CON) 10 MEQ tablet  . sildenafil (REVATIO) 20 MG tablet  .  simvastatin (ZOCOR) 10 MG tablet  . tamsulosin (FLOMAX) 0.4 MG CAPS capsule  . traMADol (ULTRAM) 50 MG tablet   No current facility-administered medications for this encounter.    Konrad Felix, PA-C [facility] Pre-Surgical Testing [phone]

## 2020-11-09 NOTE — Anesthesia Preprocedure Evaluation (Signed)
Anesthesia Evaluation  Patient identified by MRN, date of birth, ID band Patient awake    Reviewed: Allergy & Precautions, NPO status , Patient's Chart, lab work & pertinent test results  History of Anesthesia Complications Negative for: history of anesthetic complications  Airway Mallampati: II  TM Distance: >3 FB Neck ROM: Full    Dental  (+) Edentulous Lower, Edentulous Upper   Pulmonary former smoker,    Pulmonary exam normal        Cardiovascular hypertension, Pt. on medications Normal cardiovascular exam     Neuro/Psych Lumbar stenosis negative psych ROS   GI/Hepatic Neg liver ROS, GERD  Controlled and Medicated,  Endo/Other  negative endocrine ROS  Renal/GU negative Renal ROS  negative genitourinary   Musculoskeletal  (+) Arthritis , Osteoarthritis,    Abdominal   Peds  Hematology negative hematology ROS (+)   Anesthesia Other Findings Day of surgery medications reviewed with patient.  Reproductive/Obstetrics negative OB ROS                          Anesthesia Physical Anesthesia Plan  ASA: II  Anesthesia Plan: Spinal   Post-op Pain Management:    Induction:   PONV Risk Score and Plan: 2 and Treatment may vary due to age or medical condition, Ondansetron and Propofol infusion  Airway Management Planned: Natural Airway and Simple Face Mask  Additional Equipment: None  Intra-op Plan:   Post-operative Plan:   Informed Consent: I have reviewed the patients History and Physical, chart, labs and discussed the procedure including the risks, benefits and alternatives for the proposed anesthesia with the patient or authorized representative who has indicated his/her understanding and acceptance.     Dental advisory given  Plan Discussed with: CRNA  Anesthesia Plan Comments: (See PAT note 11/05/2020, Konrad Felix, PA-C)      Anesthesia Quick Evaluation

## 2020-11-10 ENCOUNTER — Observation Stay (HOSPITAL_COMMUNITY)
Admission: RE | Admit: 2020-11-10 | Discharge: 2020-11-11 | Disposition: A | Discharge status: Home / Self Care | Payer: Medicare Other | Attending: Orthopedic Surgery | Admitting: Orthopedic Surgery

## 2020-11-10 ENCOUNTER — Ambulatory Visit (HOSPITAL_COMMUNITY): Payer: Medicare Other | Admitting: Physician Assistant

## 2020-11-10 ENCOUNTER — Ambulatory Visit (HOSPITAL_COMMUNITY): Payer: Medicare Other | Admitting: Certified Registered"

## 2020-11-10 ENCOUNTER — Encounter (HOSPITAL_COMMUNITY)
Admission: RE | Disposition: A | Discharge status: Home / Self Care | Payer: Self-pay | Source: Home / Self Care | Attending: Orthopedic Surgery

## 2020-11-10 ENCOUNTER — Ambulatory Visit (HOSPITAL_COMMUNITY): Payer: Medicare Other

## 2020-11-10 ENCOUNTER — Encounter (HOSPITAL_COMMUNITY): Payer: Self-pay | Admitting: Orthopedic Surgery

## 2020-11-10 ENCOUNTER — Other Ambulatory Visit: Payer: Self-pay

## 2020-11-10 ENCOUNTER — Observation Stay (HOSPITAL_COMMUNITY): Payer: Medicare Other

## 2020-11-10 DIAGNOSIS — M1611 Unilateral primary osteoarthritis, right hip: Principal | ICD-10-CM | POA: Insufficient documentation

## 2020-11-10 DIAGNOSIS — M169 Osteoarthritis of hip, unspecified: Secondary | ICD-10-CM | POA: Diagnosis present

## 2020-11-10 DIAGNOSIS — Z96649 Presence of unspecified artificial hip joint: Secondary | ICD-10-CM

## 2020-11-10 DIAGNOSIS — Z471 Aftercare following joint replacement surgery: Secondary | ICD-10-CM | POA: Diagnosis not present

## 2020-11-10 DIAGNOSIS — F17220 Nicotine dependence, chewing tobacco, uncomplicated: Secondary | ICD-10-CM | POA: Diagnosis not present

## 2020-11-10 DIAGNOSIS — Z79899 Other long term (current) drug therapy: Secondary | ICD-10-CM | POA: Insufficient documentation

## 2020-11-10 DIAGNOSIS — D649 Anemia, unspecified: Secondary | ICD-10-CM | POA: Diagnosis not present

## 2020-11-10 DIAGNOSIS — Z7982 Long term (current) use of aspirin: Secondary | ICD-10-CM | POA: Insufficient documentation

## 2020-11-10 DIAGNOSIS — Z85828 Personal history of other malignant neoplasm of skin: Secondary | ICD-10-CM | POA: Insufficient documentation

## 2020-11-10 DIAGNOSIS — I10 Essential (primary) hypertension: Secondary | ICD-10-CM | POA: Diagnosis not present

## 2020-11-10 DIAGNOSIS — Z96641 Presence of right artificial hip joint: Secondary | ICD-10-CM | POA: Diagnosis not present

## 2020-11-10 DIAGNOSIS — E785 Hyperlipidemia, unspecified: Secondary | ICD-10-CM | POA: Diagnosis not present

## 2020-11-10 DIAGNOSIS — Z419 Encounter for procedure for purposes other than remedying health state, unspecified: Secondary | ICD-10-CM

## 2020-11-10 DIAGNOSIS — M25551 Pain in right hip: Secondary | ICD-10-CM | POA: Diagnosis present

## 2020-11-10 HISTORY — PX: TOTAL HIP ARTHROPLASTY: SHX124

## 2020-11-10 LAB — TYPE AND SCREEN
ABO/RH(D): A POS
Antibody Screen: NEGATIVE

## 2020-11-10 LAB — ABO/RH: ABO/RH(D): A POS

## 2020-11-10 SURGERY — ARTHROPLASTY, HIP, TOTAL, ANTERIOR APPROACH
Anesthesia: Spinal | Site: Hip | Laterality: Right

## 2020-11-10 MED ORDER — AMLODIPINE BESYLATE 5 MG PO TABS: 2.5000 mg | ORAL_TABLET | Freq: Every day | ORAL | Status: DC

## 2020-11-10 MED ORDER — MIRABEGRON ER 25 MG PO TB24
25.0000 mg | ORAL_TABLET | Freq: Every day | ORAL | Status: DC
  Administered 2020-11-11: 25 mg via ORAL
  Filled 2020-11-10: qty 1

## 2020-11-10 MED ORDER — POVIDONE-IODINE 10 % EX SWAB
2.0000 "application " | Freq: Once | CUTANEOUS | Status: AC
  Administered 2020-11-10: 2 via TOPICAL

## 2020-11-10 MED ORDER — METHOCARBAMOL 1000 MG/10ML IJ SOLN
500.0000 mg | Freq: Four times a day (QID) | INTRAVENOUS | Status: DC | PRN
  Filled 2020-11-10: qty 5

## 2020-11-10 MED ORDER — PROPOFOL 1000 MG/100ML IV EMUL
INTRAVENOUS | Status: AC
  Filled 2020-11-10: qty 100

## 2020-11-10 MED ORDER — TRAMADOL HCL 50 MG PO TABS: 50.0000 mg | ORAL_TABLET | Freq: Four times a day (QID) | ORAL | Status: DC | PRN

## 2020-11-10 MED ORDER — POTASSIUM CHLORIDE CRYS ER 10 MEQ PO TBCR
10.0000 meq | EXTENDED_RELEASE_TABLET | Freq: Every day | ORAL | Status: DC
  Administered 2020-11-10 – 2020-11-11 (×2): 10 meq via ORAL
  Filled 2020-11-10 (×2): qty 1

## 2020-11-10 MED ORDER — CHLORHEXIDINE GLUCONATE 0.12 % MT SOLN
15.0000 mL | Freq: Once | OROMUCOSAL | Status: AC
  Administered 2020-11-10: 10:00:00 15 mL via OROMUCOSAL

## 2020-11-10 MED ORDER — DEXAMETHASONE SODIUM PHOSPHATE 10 MG/ML IJ SOLN
8.0000 mg | Freq: Once | INTRAMUSCULAR | Status: AC
  Administered 2020-11-10: 8 mg via INTRAVENOUS

## 2020-11-10 MED ORDER — MAGNESIUM CITRATE PO SOLN: 1.0000 | Freq: Once | ORAL | Status: DC | PRN

## 2020-11-10 MED ORDER — SIMVASTATIN 20 MG PO TABS: 10.0000 mg | ORAL_TABLET | Freq: Every day | ORAL | Status: DC

## 2020-11-10 MED ORDER — TAMSULOSIN HCL 0.4 MG PO CAPS
0.4000 mg | ORAL_CAPSULE | Freq: Every day | ORAL | Status: DC
  Administered 2020-11-10: 0.4 mg via ORAL
  Filled 2020-11-10: qty 1

## 2020-11-10 MED ORDER — LACTATED RINGERS IV SOLN: INTRAVENOUS | Status: DC

## 2020-11-10 MED ORDER — ONDANSETRON HCL 4 MG PO TABS: 4.0000 mg | ORAL_TABLET | Freq: Four times a day (QID) | ORAL | Status: DC | PRN

## 2020-11-10 MED ORDER — METHOCARBAMOL 500 MG PO TABS
500.0000 mg | ORAL_TABLET | Freq: Four times a day (QID) | ORAL | Status: DC | PRN
  Administered 2020-11-10 – 2020-11-11 (×2): 500 mg via ORAL
  Filled 2020-11-10 (×2): qty 1

## 2020-11-10 MED ORDER — RIVAROXABAN 10 MG PO TABS
10.0000 mg | ORAL_TABLET | Freq: Every day | ORAL | Status: DC
  Administered 2020-11-11: 10 mg via ORAL
  Filled 2020-11-10: qty 1

## 2020-11-10 MED ORDER — FENTANYL CITRATE (PF) 100 MCG/2ML IJ SOLN
INTRAMUSCULAR | Status: DC | PRN
  Administered 2020-11-10: 25 ug via INTRAVENOUS
  Administered 2020-11-10: 50 ug via INTRAVENOUS

## 2020-11-10 MED ORDER — ACETAMINOPHEN 10 MG/ML IV SOLN
1000.0000 mg | Freq: Four times a day (QID) | INTRAVENOUS | Status: DC
  Administered 2020-11-10: 1000 mg via INTRAVENOUS
  Filled 2020-11-10: qty 100

## 2020-11-10 MED ORDER — SODIUM CHLORIDE 0.9 % IV SOLN: INTRAVENOUS | Status: DC

## 2020-11-10 MED ORDER — ONDANSETRON HCL 4 MG/2ML IJ SOLN: 4.0000 mg | Freq: Four times a day (QID) | INTRAMUSCULAR | Status: DC | PRN

## 2020-11-10 MED ORDER — DEXAMETHASONE SODIUM PHOSPHATE 10 MG/ML IJ SOLN
10.0000 mg | Freq: Once | INTRAMUSCULAR | Status: AC
  Administered 2020-11-11: 10 mg via INTRAVENOUS
  Filled 2020-11-10: qty 1

## 2020-11-10 MED ORDER — ACETAMINOPHEN 325 MG PO TABS: 325.0000 mg | ORAL_TABLET | Freq: Four times a day (QID) | ORAL | Status: DC | PRN

## 2020-11-10 MED ORDER — CEFAZOLIN SODIUM-DEXTROSE 2-4 GM/100ML-% IV SOLN
2.0000 g | Freq: Four times a day (QID) | INTRAVENOUS | Status: AC
  Administered 2020-11-10 – 2020-11-11 (×2): 2 g via INTRAVENOUS
  Filled 2020-11-10 (×2): qty 100

## 2020-11-10 MED ORDER — ONDANSETRON HCL 4 MG/2ML IJ SOLN
INTRAMUSCULAR | Status: AC
  Filled 2020-11-10: qty 2

## 2020-11-10 MED ORDER — BUPIVACAINE IN DEXTROSE 0.75-8.25 % IT SOLN
INTRATHECAL | Status: DC | PRN
  Administered 2020-11-10: 1.6 mL via INTRATHECAL

## 2020-11-10 MED ORDER — TRANEXAMIC ACID-NACL 1000-0.7 MG/100ML-% IV SOLN
1000.0000 mg | INTRAVENOUS | Status: AC
  Administered 2020-11-10: 1000 mg via INTRAVENOUS
  Filled 2020-11-10: qty 100

## 2020-11-10 MED ORDER — BUPIVACAINE-EPINEPHRINE (PF) 0.25% -1:200000 IJ SOLN
INTRAMUSCULAR | Status: AC
  Filled 2020-11-10: qty 30

## 2020-11-10 MED ORDER — BISACODYL 10 MG RE SUPP: 10.0000 mg | Freq: Every day | RECTAL | Status: DC | PRN

## 2020-11-10 MED ORDER — WATER FOR IRRIGATION, STERILE IR SOLN
Status: DC | PRN
  Administered 2020-11-10: 2000 mL

## 2020-11-10 MED ORDER — PROPOFOL 500 MG/50ML IV EMUL
INTRAVENOUS | Status: DC | PRN
  Administered 2020-11-10: 40 ug/kg/min via INTRAVENOUS

## 2020-11-10 MED ORDER — FENTANYL CITRATE (PF) 100 MCG/2ML IJ SOLN
INTRAMUSCULAR | Status: AC
  Filled 2020-11-10: qty 2

## 2020-11-10 MED ORDER — PHENOL 1.4 % MT LIQD: 1.0000 | OROMUCOSAL | Status: DC | PRN

## 2020-11-10 MED ORDER — ONDANSETRON HCL 4 MG/2ML IJ SOLN
INTRAMUSCULAR | Status: DC | PRN
  Administered 2020-11-10: 4 mg via INTRAVENOUS

## 2020-11-10 MED ORDER — PHENYLEPHRINE HCL-NACL 10-0.9 MG/250ML-% IV SOLN
INTRAVENOUS | Status: DC | PRN
  Administered 2020-11-10: 30 ug/min via INTRAVENOUS

## 2020-11-10 MED ORDER — PANTOPRAZOLE SODIUM 40 MG PO TBEC
40.0000 mg | DELAYED_RELEASE_TABLET | Freq: Every day | ORAL | Status: DC
  Administered 2020-11-11: 40 mg via ORAL
  Filled 2020-11-10: qty 1

## 2020-11-10 MED ORDER — PHENYLEPHRINE HCL (PRESSORS) 10 MG/ML IV SOLN
INTRAVENOUS | Status: AC
  Filled 2020-11-10: qty 1

## 2020-11-10 MED ORDER — POLYETHYLENE GLYCOL 3350 17 G PO PACK: 17.0000 g | PACK | Freq: Every day | ORAL | Status: DC | PRN

## 2020-11-10 MED ORDER — CEFAZOLIN SODIUM-DEXTROSE 2-4 GM/100ML-% IV SOLN
2.0000 g | INTRAVENOUS | Status: AC
  Administered 2020-11-10: 2 g via INTRAVENOUS
  Filled 2020-11-10: qty 100

## 2020-11-10 MED ORDER — DOCUSATE SODIUM 100 MG PO CAPS
100.0000 mg | ORAL_CAPSULE | Freq: Two times a day (BID) | ORAL | Status: DC
  Administered 2020-11-10 – 2020-11-11 (×2): 100 mg via ORAL
  Filled 2020-11-10 (×2): qty 1

## 2020-11-10 MED ORDER — HYDROCODONE-ACETAMINOPHEN 5-325 MG PO TABS: 1.0000 | ORAL_TABLET | ORAL | Status: DC | PRN

## 2020-11-10 MED ORDER — MORPHINE SULFATE (PF) 2 MG/ML IV SOLN: 0.5000 mg | INTRAVENOUS | Status: DC | PRN

## 2020-11-10 MED ORDER — BUPIVACAINE-EPINEPHRINE (PF) 0.25% -1:200000 IJ SOLN
INTRAMUSCULAR | Status: DC | PRN
  Administered 2020-11-10: 30 mL

## 2020-11-10 MED ORDER — METOCLOPRAMIDE HCL 5 MG PO TABS: 5.0000 mg | ORAL_TABLET | Freq: Three times a day (TID) | ORAL | Status: DC | PRN

## 2020-11-10 MED ORDER — DEXAMETHASONE SODIUM PHOSPHATE 10 MG/ML IJ SOLN
INTRAMUSCULAR | Status: AC
  Filled 2020-11-10: qty 1

## 2020-11-10 MED ORDER — MENTHOL 3 MG MT LOZG: 1.0000 | LOZENGE | OROMUCOSAL | Status: DC | PRN

## 2020-11-10 MED ORDER — ORAL CARE MOUTH RINSE: 15.0000 mL | Freq: Once | OROMUCOSAL | Status: AC

## 2020-11-10 MED ORDER — PHENYLEPHRINE 40 MCG/ML (10ML) SYRINGE FOR IV PUSH (FOR BLOOD PRESSURE SUPPORT): PREFILLED_SYRINGE | INTRAVENOUS | Status: DC | PRN

## 2020-11-10 MED ORDER — HYDROCODONE-ACETAMINOPHEN 7.5-325 MG PO TABS
1.0000 | ORAL_TABLET | ORAL | Status: DC | PRN
  Administered 2020-11-10 – 2020-11-11 (×2): 2 via ORAL
  Administered 2020-11-11: 1 via ORAL
  Filled 2020-11-10 (×2): qty 1
  Filled 2020-11-10 (×2): qty 2

## 2020-11-10 MED ORDER — AMLODIPINE BESYLATE 5 MG PO TABS
5.0000 mg | ORAL_TABLET | Freq: Every day | ORAL | Status: DC
  Administered 2020-11-11: 5 mg via ORAL
  Filled 2020-11-10: qty 1

## 2020-11-10 MED ORDER — FAMOTIDINE 20 MG PO TABS
20.0000 mg | ORAL_TABLET | Freq: Every day | ORAL | Status: DC
Start: 2020-11-10 — End: 2020-11-11
  Administered 2020-11-10: 20 mg via ORAL
  Filled 2020-11-10: qty 1

## 2020-11-10 MED ORDER — METOCLOPRAMIDE HCL 5 MG/ML IJ SOLN: 5.0000 mg | Freq: Three times a day (TID) | INTRAMUSCULAR | Status: DC | PRN

## 2020-11-10 MED ORDER — 0.9 % SODIUM CHLORIDE (POUR BTL) OPTIME
TOPICAL | Status: DC | PRN
  Administered 2020-11-10: 1000 mL

## 2020-11-10 SURGICAL SUPPLY — 45 items
BAG DECANTER FOR FLEXI CONT (MISCELLANEOUS) IMPLANT
BAG ZIPLOCK 12X15 (MISCELLANEOUS) IMPLANT
BLADE SAG 18X100X1.27 (BLADE) ×2 IMPLANT
COVER PERINEAL POST (MISCELLANEOUS) ×2 IMPLANT
COVER SURGICAL LIGHT HANDLE (MISCELLANEOUS) ×2 IMPLANT
COVER WAND RF STERILE (DRAPES) IMPLANT
CUP ACET PINNACLE SECTR 50MM (Hips) ×1 IMPLANT
DECANTER SPIKE VIAL GLASS SM (MISCELLANEOUS) ×2 IMPLANT
DRAPE STERI IOBAN 125X83 (DRAPES) ×2 IMPLANT
DRAPE U-SHAPE 47X51 STRL (DRAPES) ×4 IMPLANT
DRESSING AQUACEL AG SP 3.5X10 (GAUZE/BANDAGES/DRESSINGS) ×1 IMPLANT
DRSG AQUACEL AG ADV 3.5X10 (GAUZE/BANDAGES/DRESSINGS) ×2 IMPLANT
DRSG AQUACEL AG SP 3.5X10 (GAUZE/BANDAGES/DRESSINGS) ×2
DURAPREP 26ML APPLICATOR (WOUND CARE) ×2 IMPLANT
ELECT REM PT RETURN 15FT ADLT (MISCELLANEOUS) ×2 IMPLANT
EVACUATOR 1/8 PVC DRAIN (DRAIN) IMPLANT
GLOVE BIO SURGEON STRL SZ8 (GLOVE) ×2 IMPLANT
GLOVE BIOGEL PI IND STRL 8.5 (GLOVE) IMPLANT
GLOVE BIOGEL PI INDICATOR 8.5 (GLOVE)
GLOVE SRG 8 PF TXTR STRL LF DI (GLOVE) ×1 IMPLANT
GLOVE SURG ENC MOIS LTX SZ6 (GLOVE) IMPLANT
GLOVE SURG ENC MOIS LTX SZ7 (GLOVE) IMPLANT
GLOVE SURG UNDER POLY LF SZ6.5 (GLOVE) IMPLANT
GLOVE SURG UNDER POLY LF SZ8 (GLOVE) ×2
GOWN STRL REUS W/TWL LRG LVL3 (GOWN DISPOSABLE) ×2 IMPLANT
GOWN STRL REUS W/TWL XL LVL3 (GOWN DISPOSABLE) IMPLANT
HEAD FEM STD 32X+5 STRL (Hips) ×2 IMPLANT
HOLDER FOLEY CATH W/STRAP (MISCELLANEOUS) ×2 IMPLANT
KIT TURNOVER KIT A (KITS) ×2 IMPLANT
LINER MARATHON 32 50 (Hips) ×2 IMPLANT
MANIFOLD NEPTUNE II (INSTRUMENTS) ×2 IMPLANT
PACK ANTERIOR HIP CUSTOM (KITS) ×2 IMPLANT
PENCIL SMOKE EVACUATOR COATED (MISCELLANEOUS) ×2 IMPLANT
PINNACLE SECTOR CUP 50MM (Hips) ×2 IMPLANT
STEM FEM ACTIS HIGH SZ7 (Stem) ×2 IMPLANT
STRIP CLOSURE SKIN 1/2X4 (GAUZE/BANDAGES/DRESSINGS) ×2 IMPLANT
SUT ETHIBOND NAB CT1 #1 30IN (SUTURE) ×2 IMPLANT
SUT MNCRL AB 4-0 PS2 18 (SUTURE) ×2 IMPLANT
SUT STRATAFIX 0 PDS 27 VIOLET (SUTURE) ×2
SUT VIC AB 2-0 CT1 27 (SUTURE) ×4
SUT VIC AB 2-0 CT1 TAPERPNT 27 (SUTURE) ×2 IMPLANT
SUTURE STRATFX 0 PDS 27 VIOLET (SUTURE) ×1 IMPLANT
SYR 50ML LL SCALE MARK (SYRINGE) IMPLANT
TRAY FOLEY MTR SLVR 16FR STAT (SET/KITS/TRAYS/PACK) ×2 IMPLANT
TUBE SUCTION HIGH CAP CLEAR NV (SUCTIONS) ×2 IMPLANT

## 2020-11-10 NOTE — Care Plan (Signed)
Ortho Bundle Case Management Note  Patient Details  Name: Adrian Davis MRN: [id number] Date of Birth: Jan 19, 1941                  R THA on 11/10/20. DCP: Home with wife, Aram Beecham. 1 story home with 3 steps. DME: RW ordered through [place]. Doesn't want 3in1. PT: HEP   DME Arranged:  [facility] rolling DME Agency:  [employer]  HH Arranged:    [place] Agency:     Additional Comments: Please contact me with any questions of if this plan should need to change.  Marianne Sofia, RN,CCM [facility]  [phone] 11/10/2020, 10:28 AM

## 2020-11-10 NOTE — Anesthesia Procedure Notes (Signed)
Spinal  Patient location during procedure: OR Start time: 11/10/2020 10:50 AM End time: 11/10/2020 10:53 AM Staffing Performed: anesthesiologist  Anesthesiologist: Brennan Bailey, MD Preanesthetic Checklist Completed: patient identified, IV checked, risks and benefits discussed, surgical consent, monitors and equipment checked, pre-op evaluation and timeout performed Spinal Block Patient position: sitting Prep: DuraPrep and site prepped and draped Patient monitoring: continuous pulse ox, blood pressure and heart rate Approach: right paramedian Location: L3-4 Injection technique: single-shot Needle Needle type: Pencan  Needle gauge: 24 G Needle length: 9 cm Additional Notes Risks, benefits, and alternative discussed. Patient gave consent to procedure. Prepped and draped in sitting position. Patient sedated but responsive to voice. 2 attempts required. First in midline not successful. Second attempt right paramedian with clear CSF obtained. Positive terminal aspiration. No pain or paraesthesias with injection. Patient tolerated procedure well. Vital signs stable. Tawny Asal, MD

## 2020-11-10 NOTE — Interval H&P Note (Signed)
History and Physical Interval Note:  11/10/2020 9:41 AM  Adrian Davis  has presented today for surgery, with the diagnosis of right hip osteoarthritis.  The various methods of treatment have been discussed with the patient and family. After consideration of risks, benefits and other options for treatment, the patient has consented to  Procedure(s) with comments: Davis (Right) - 175min as a surgical intervention.  The patient's history has been reviewed, patient examined, no change in status, stable for surgery.  I have reviewed the patient's chart and labs.  Questions were answered to the patient's satisfaction.     Pilar Plate Jalon Blackwelder

## 2020-11-10 NOTE — Evaluation (Signed)
Physical Therapy Evaluation Patient Details Name: Adrian Davis MRN: [id number] DOB: May 01, 1941 Today's Date: 11/10/2020   History of Present Illness  Patient is a 80 yo male s/p R THA ant approach on 11/10/2020 with PMH significant for OA, HTN, hyperlipidemia, GERD, anemia, bell palsy, abnormal echocardiogram, BPH, DDD, vocal cord leukoplakia, and B shoulder surgery.  Clinical Impression  Pt is a 80yo male s/p Rt THA POD 0. Pt reports that he is independent with mobility at baseline. Pt required MIN guard and verbal cues for hand placement with sit to stand transfer.Pt required MIN assist progressing to MIN guard for ambulation 160'ft with verbal cues for RW management with no LOB noted. Initiated LE strengthening exercises, pt tolerated well. Pt's wife is available to assist at home and his son lives nearby and will check in periodically. Recommend home with family support. Pt will benefit from skilled PT to increase independence and safety with mobility. EOS pt's HR reached between 108-120bpm while seated, RN notified and NT will assess vitals. Acute therapy to follow up to progress toward acute goals to ensure safe discharge home.      Follow Up Recommendations Follow surgeon's recommendation for DC plan and follow-up therapies    Equipment Recommendations  Rolling walker with 5" wheels    Recommendations for Other Services       Precautions / Restrictions Precautions Precautions: Fall Restrictions Weight Bearing Restrictions: No Other Position/Activity Restrictions: WBAT      Mobility  Bed Mobility Overal bed mobility: Needs Assistance Bed Mobility: Supine to Sit     Supine to sit: Supervision     General bed mobility comments: Pt was able to use bed rails and UEs to scoot to EOB with supervision for safety and verbal cues for technique    Transfers Overall transfer level: Needs assistance Equipment used: Rolling walker (2 wheeled) Transfers: Sit to/from Stand Sit to  Stand: Min guard         General transfer comment: Pt was able to transfer sit to stand with MIN guard for safety and verbal cues for hand placement.  Ambulation/Gait Ambulation/Gait assistance: Min guard;Min assist Gait Distance (Feet): 160 Feet Assistive device: Rolling walker (2 wheeled) Gait Pattern/deviations: Step-through pattern;Decreased weight shift to right;Decreased stride length Gait velocity: decr   General Gait Details: Pt ambulated with MIN assist progressing to MIN guard for safety and verbal cues for RW management.  Stairs            Wheelchair Mobility    Modified Rankin (Stroke Patients Only)       Balance Overall balance assessment: Needs assistance Sitting-balance support: Feet supported;No upper extremity supported Sitting balance-Leahy Scale: Good     Standing balance support: Bilateral upper extremity supported Standing balance-Leahy Scale: Poor Standing balance comment: Pt required use of RW to maintain standing balance.                             Pertinent Vitals/Pain Pain Assessment: 0-10 Pain Score: 2  Pain Location: Rt hip Pain Descriptors / Indicators: Sore Pain Intervention(s): Limited activity within patient's tolerance;Monitored during session;Repositioned;Ice applied    Home Living Family/patient expects to be discharged to:: Private residence Living Arrangements: Spouse/significant other Available Help at Discharge: Family Type of Home: House Home Access: Stairs to enter Entrance Stairs-Rails: None Entrance Stairs-Number of Steps: 3 Home Layout: One level Home Equipment: Grab bars - tub/shower;Shower seat;Hand held shower head Additional Comments: wife is available to help  at home. Pt's son works from home and will stop by periodically.    Prior Function Level of Independence: Independent               Hand Dominance   Dominant Hand: Right    Extremity/Trunk Assessment   Upper Extremity  Assessment Upper Extremity Assessment: Overall WFL for tasks assessed    Lower Extremity Assessment Lower Extremity Assessment: RLE deficits/detail RLE Deficits / Details: Pt able to perform 4/5 quad set RLE Sensation: WNL RLE Coordination: WNL    Cervical / Trunk Assessment Cervical / Trunk Assessment: Normal  Communication   Communication: No difficulties  Cognition Arousal/Alertness: Awake/alert Behavior During Therapy: WFL for tasks assessed/performed Overall Cognitive Status: Within Functional Limits for tasks assessed                                        General Comments      Exercises Total Joint Exercises Ankle Circles/Pumps: AROM;Both;20 reps;Seated Quad Sets: AROM;Right;10 reps;Seated Heel Slides: AROM;10 reps;Seated;Right   Assessment/Plan    PT Assessment Patient needs continued PT services  PT Problem List Decreased strength;Decreased range of motion;Decreased activity tolerance;Decreased balance;Decreased mobility;Decreased knowledge of use of DME;Pain       PT Treatment Interventions DME instruction;Gait training;Functional mobility training;Stair training;Therapeutic activities;Therapeutic exercise;Balance training;Neuromuscular re-education;Patient/family education    PT Goals (Current goals can be found in the Care Plan section)  Acute Rehab PT Goals Patient Stated Goal: 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 PT Goal Formulation: With patient/family Time For Goal Achievement: 11/17/20 Potential to Achieve Goals: Good    Frequency 7X/week   Barriers to discharge        Co-evaluation               AM-PAC PT "6 Clicks" Mobility  Outcome Measure Help needed turning from your back to your side while in a flat bed without using bedrails?: A Little Help needed moving from lying on your back to sitting on the side of a flat bed without using bedrails?: A Little Help needed moving to and from a bed to a chair  (including a wheelchair)?: A Little Help needed standing up from a chair using your arms (e.g., wheelchair or bedside chair)?: A Little Help needed to walk in hospital room?: A Little Help needed climbing 3-5 steps with a railing? : A Little 6 Click Score: 18    End of Session Equipment Utilized During Treatment: Gait belt Activity Tolerance: Patient tolerated treatment well Patient left: in chair;with chair alarm set;with family/visitor present Nurse Communication: Mobility status PT Visit Diagnosis: Unsteadiness on feet (R26.81);Muscle weakness (generalized) (M62.81);Pain Pain - Right/Left: Right Pain - part of body: Hip    Time: 9767-3419 PT Time Calculation (min) (ACUTE ONLY): 29 min   Charges:              Elna Breslow, SPT  Acute rehab    Elna Breslow 11/10/2020, 6:38 PM

## 2020-11-10 NOTE — Anesthesia Procedure Notes (Signed)
Procedure Name: MAC Date/Time: 11/10/2020 10:48 AM Performed by: Eben Burow, CRNA Pre-anesthesia Checklist: Patient identified, Emergency Drugs available, Suction available, Patient being monitored and Timeout performed Oxygen Delivery Method: Simple face mask Placement Confirmation: positive ETCO2

## 2020-11-10 NOTE — Discharge Instructions (Addendum)
°Frank Aluisio, MD °Total Joint Specialist °[facility] °[address] °[place], [place] [address] °[phone] ° °ANTERIOR APPROACH TOTAL HIP REPLACEMENT POSTOPERATIVE DIRECTIONS ° ° ° ° °Hip Rehabilitation, Guidelines Following Surgery  °The results of a hip operation are greatly improved after range of motion and muscle strengthening exercises. Follow all safety measures which are given to protect your hip. If any of these exercises cause increased pain or swelling in your joint, decrease the amount until you are comfortable again. Then slowly increase the exercises. Call your caregiver if you have problems or questions.  ° °BLOOD CLOT PREVENTION °• Take a 10 mg Xarelto once a day for three weeks following surgery. Then resume one 81 mg aspirin once a day. °• You may resume your vitamins/supplements once you have discontinued the Xarelto. °• Do not take any NSAIDs (Advil, Aleve, Ibuprofen, Meloxicam, etc.) until you have discontinued the Xarelto.  ° °HOME CARE INSTRUCTIONS  °• Remove items at home which could result in a fall. This includes throw rugs or furniture in walking pathways.  °· ICE to the affected hip as frequently as 20-30 minutes an hour and then as needed for pain and swelling. Continue to use ice on the hip for pain and swelling from surgery. You may notice swelling that will progress down to the foot and ankle. This is normal after surgery. Elevate the leg when you are not up walking on it.   °· Continue to use the breathing machine which will help keep your temperature down.  It is common for your temperature to cycle up and down following surgery, especially at night when you are not up moving around and exerting yourself.  The breathing machine keeps your lungs expanded and your temperature down. ° °DIET °You may resume your previous home diet once your are discharged from the hospital. ° °DRESSING / WOUND CARE / SHOWERING °• You have an adhesive waterproof bandage  over the incision. Leave this in place until your first follow-up appointment. Once you remove this you will not need to place another bandage.  °• You may begin showering 3 days following surgery, but do not submerge the incision under water. ° °ACTIVITY °• For the first 3-5 days, it is important to rest and keep the operative leg elevated. You should, as a general rule, rest for 50 minutes and walk/stretch for 10 minutes per hour. After 5 days, you may slowly increase activity as tolerated.  °• Perform the exercises you were provided twice a day for about 15-20 minutes each session. Begin these 2 days following surgery. °• Walk with your walker as instructed. Use the walker until you are comfortable transitioning to a cane. Walk with the cane in the opposite hand of the operative leg. You may discontinue the cane once you are comfortable and walking steadily. °• Avoid periods of inactivity such as sitting longer than an hour when not asleep. This helps prevent blood clots.  °• Do not drive a car for 6 weeks or until released by your surgeon.  °• Do not drive while taking narcotics. ° °TED HOSE STOCKINGS °Wear the elastic stockings on both legs for three weeks following surgery during the day. You may remove them at night while sleeping. ° °WEIGHT BEARING °Weight bearing as tolerated with assist device (walker, cane, etc) as directed, use it as long as suggested by your surgeon or therapist, typically at least 4-6 weeks. ° °POSTOPERATIVE CONSTIPATION PROTOCOL °Constipation - defined medically as fewer than three   stools per week and severe constipation as less than one stool per week. ° °One of the most common issues patients have following surgery is constipation.  Even if you have a regular bowel pattern at home, your normal regimen is likely to be disrupted due to multiple reasons following surgery.  Combination of anesthesia, postoperative narcotics, change in appetite and fluid intake all can affect your  bowels.  In order to avoid complications following surgery, here are some recommendations in order to help you during your recovery period. ° °• Colace (docusate) - Pick up an over-the-counter form of Colace or another stool softener and take twice a day as long as you are requiring postoperative pain medications.  Take with a full glass of water daily.  If you experience loose stools or diarrhea, hold the colace until you stool forms back up.  If your symptoms do not get better within 1 week or if they get worse, check with your doctor. °• Dulcolax (bisacodyl) - Pick up over-the-counter and take as directed by the product packaging as needed to assist with the movement of your bowels.  Take with a full glass of water.  Use this product as needed if not relieved by Colace only.  °• MiraLax (polyethylene glycol) - Pick up over-the-counter to have on hand.  MiraLax is a solution that will increase the amount of water in your bowels to assist with bowel movements.  Take as directed and can mix with a glass of water, juice, soda, coffee, or tea.  Take if you go more than two days without a movement.Do not use MiraLax more than once per day. Call your doctor if you are still constipated or irregular after using this medication for 7 days in a row. ° °If you continue to have problems with postoperative constipation, please contact the office for further assistance and recommendations.  If you experience "the worst abdominal pain ever" or develop nausea or vomiting, please contact the office immediatly for further recommendations for treatment. ° °ITCHING ° If you experience itching with your medications, try taking only a single pain pill, or even half a pain pill at a time.  You can also use Benadryl over the counter for itching or also to help with sleep.  ° °MEDICATIONS °See your medication summary on the “After Visit Summary” that the nursing staff will review with you prior to discharge.  You may have some home  medications which will be placed on hold until you complete the course of blood thinner medication.  It is important for you to complete the blood thinner medication as prescribed by your surgeon.  Continue your approved medications as instructed at time of discharge. ° °PRECAUTIONS °If you experience chest pain or shortness of breath - call 911 immediately for transfer to the hospital emergency department.  °If you develop a fever greater that 101 F, purulent drainage from wound, increased redness or drainage from wound, foul odor from the wound/dressing, or calf pain - CONTACT YOUR SURGEON.   °                                                °FOLLOW-UP APPOINTMENTS °Make sure you keep all of your appointments after your operation with your surgeon and caregivers. You should call the office at the above phone number and make an appointment for approximately two   weeks after the date of your surgery or on the date instructed by your surgeon outlined in the "After Visit Summary". ° °RANGE OF MOTION AND STRENGTHENING EXERCISES  °These exercises are designed to help you keep full movement of your hip joint. Follow your caregiver's or physical therapist's instructions. Perform all exercises about fifteen times, three times per day or as directed. Exercise both hips, even if you have had only one joint replacement. These exercises can be done on a training (exercise) mat, on the floor, on a table or on a bed. Use whatever works the best and is most comfortable for you. Use music or television while you are exercising so that the exercises are a pleasant break in your day. This will make your life better with the exercises acting as a break in routine you can look forward to.  °• Lying on your back, slowly slide your foot toward your buttocks, raising your knee up off the floor. Then slowly slide your foot back down until your leg is straight again.  °• Lying on your back spread your legs as far apart as you can without  causing discomfort.  °• Lying on your side, raise your upper leg and foot straight up from the floor as far as is comfortable. Slowly lower the leg and repeat.  °• Lying on your back, tighten up the muscle in the front of your thigh (quadriceps muscles). You can do this by keeping your leg straight and trying to raise your heel off the floor. This helps strengthen the largest muscle supporting your knee.  °• Lying on your back, tighten up the muscles of your buttocks both with the legs straight and with the knee bent at a comfortable angle while keeping your heel on the floor.  ° °IF YOU ARE TRANSFERRED TO A SKILLED REHAB FACILITY °If the patient is transferred to a skilled rehab facility following release from the hospital, a list of the current medications will be sent to the facility for the patient to continue.  When discharged from the skilled rehab facility, please have the facility set up the patient's Home Health Physical Therapy prior to being released. Also, the skilled facility will be responsible for providing the patient with their medications at time of release from the facility to include their pain medication, the muscle relaxants, and their blood thinner medication. If the patient is still at the rehab facility at time of the two week follow up appointment, the skilled rehab facility will also need to assist the patient in arranging follow up appointment in our office and any transportation needs. ° °MAKE SURE YOU:  °• Understand these instructions.  °• Get help right away if you are not doing well or get worse.  ° ° °DENTAL ANTIBIOTICS: ° °In most cases prophylactic antibiotics for Dental procdeures after total joint surgery are not necessary. ° °Exceptions are as follows: ° °1. History of prior total joint infection ° °2. Severely immunocompromised (Organ Transplant, cancer chemotherapy, Rheumatoid biologic °meds such as Humera) ° °3. Poorly controlled diabetes (A1C &gt; 8.0, blood glucose over  200) ° °If you have one of these conditions, contact your surgeon for an antibiotic prescription, prior to your °dental procedure.  ° ° °Pick up stool softner and laxative for home use following surgery while on pain medications. °Do not submerge incision under water. °Please use good hand washing techniques while changing dressing each day. °May shower starting three days after surgery. °Please use a clean towel to pat   the incision dry following showers. °Continue to use ice for pain and swelling after surgery. °Do not use any lotions or creams on the incision until instructed by your surgeon. ° °Information on my medicine - XARELTO® (Rivaroxaban) ° °Why was Xarelto® prescribed for you? °Xarelto® was prescribed for you to reduce the risk of blood clots forming after orthopedic surgery. The medical term for these abnormal blood clots is venous thromboembolism (VTE). ° °What do you need to know about xarelto® ? °Take your Xarelto® ONCE DAILY at the same time every day. °You may take it either with or without food. ° °If you have difficulty swallowing the tablet whole, you may crush it and mix in applesauce just prior to taking your dose. ° °Take Xarelto® exactly as prescribed by your doctor and DO NOT stop taking Xarelto® without talking to the doctor who prescribed the medication.  Stopping without other VTE prevention medication to take the place of Xarelto® may increase your risk of developing a clot. ° °After discharge, you should have regular check-up appointments with your healthcare provider that is prescribing your Xarelto®.   ° °What do you do if you miss a dose? °If you miss a dose, take it as soon as you remember on the same day then continue your regularly scheduled once daily regimen the next day. Do not take two doses of Xarelto® on the same day.  ° °Important Safety Information °A possible side effect of Xarelto® is bleeding. You should call your healthcare provider right away if you experience any of  the following: °? Bleeding from an injury or your nose that does not stop. °? Unusual colored urine (red or dark brown) or unusual colored stools (red or black). °? Unusual bruising for unknown reasons. °? A serious fall or if you hit your head (even if there is no bleeding). ° °Some medicines may interact with Xarelto® and might increase your risk of bleeding while on Xarelto®. To help avoid this, consult your healthcare provider or pharmacist prior to using any new prescription or non-prescription medications, including herbals, vitamins, non-steroidal anti-inflammatory drugs (NSAIDs) and supplements. ° °This website has more information on Xarelto®: www.xarelto.com. ° ° ° ° °

## 2020-11-10 NOTE — Op Note (Signed)
OPERATIVE REPORT- TOTAL HIP ARTHROPLASTY   PREOPERATIVE DIAGNOSIS: Osteoarthritis of the Right hip.   POSTOPERATIVE DIAGNOSIS: Osteoarthritis of the Right  hip.   PROCEDURE: Right total hip arthroplasty, anterior approach.   SURGEON: [name], MD   ASSISTANT: Theresa Duty, PA-C  ANESTHESIA:  Spinal  ESTIMATED BLOOD LOSS:-400 mL    DRAINS: Hemovac x1.   COMPLICATIONS: None   CONDITION: PACU - hemodynamically stable.   BRIEF CLINICAL NOTE: Adrian Davis is a 80 y.o. male who has advanced rapidly progessive end-  stage arthritis of their Right  hip with progressively worsening pain and  dysfunction.The patient has failed nonoperative management and presents for  total hip arthroplasty.   PROCEDURE IN DETAIL: After successful administration of spinal  anesthetic, the traction boots for the [facility] bed were placed on both  feet and the patient was placed onto the [facility] bed, boots placed into the leg  holders. The Right hip was then isolated from the perineum with plastic  drapes and prepped and draped in the usual sterile fashion. ASIS and  greater trochanter were marked and a oblique incision was made, starting  at about 1 cm lateral and 2 cm distal to the ASIS and coursing towards  the anterior cortex of the femur. The skin was cut with a 10 blade  through subcutaneous tissue to the level of the fascia overlying the  tensor fascia lata muscle. The fascia was then incised in line with the  incision at the junction of the anterior third and posterior 2/3rd. The  muscle was teased off the fascia and then the interval between the TFL  and the rectus was developed. The Hohmann retractor was then placed at  the top of the femoral neck over the capsule. The vessels overlying the  capsule were cauterized and the fat on top of the capsule was removed.  A Hohmann retractor was then placed anterior underneath the rectus  femoris to give exposure to the entire anterior  capsule. A T-shaped  capsulotomy was performed. The edges were tagged and the femoral head  was identified.       Osteophytes are removed off the superior acetabulum.  The femoral neck was then cut in situ with an oscillating saw. Traction  was then applied to the left lower extremity utilizing the [facility]  traction. The femoral head was then removed. Retractors were placed  around the acetabulum and then circumferential removal of the labrum was  performed. Osteophytes were also removed. Reaming starts at 47 mm to  medialize and  Increased in 2 mm increments to 49 mm. We reamed in  approximately 40 degrees of abduction, 20 degrees anteversion. A 50 mm  pinnacle acetabular shell was then impacted in anatomic position under  fluoroscopic guidance with excellent purchase. We did not need to place  any additional dome screws. A 32 mm neutral = 4 marathon liner was then  placed into the acetabular shell.       The femoral lift was then placed along the lateral aspect of the femur  just distal to the vastus ridge. The leg was  externally rotated and capsule  was stripped off the inferior aspect of the femoral neck down to the  level of the lesser trochanter, this was done with electrocautery. The femur was lifted after this was performed. The  leg was then placed in an extended and adducted position essentially delivering the femur. We also removed the capsule superiorly and the piriformis from  the piriformis fossa to gain excellent exposure of the  proximal femur. Rongeur was used to remove some cancellous bone to get  into the lateral portion of the proximal femur for placement of the  initial starter reamer. The starter broaches was placed  the starter broach  and was shown to go down the center of the canal. Broaching  with the Actis system was then performed starting at size 0  coursing  Up to size 7. A size 7 had excellent torsional and rotational  and axial stability. The trial high offset  neck was then placed  with a 32 + 5 trial head. The hip was then reduced. We confirmed that  the stem was in the canal both on AP and lateral x-rays. It also has excellent sizing. The hip was reduced with outstanding stability through full extension and full external rotation.. AP pelvis was taken and the leg lengths were measured and found to be equal. Hip was then dislocated again and the femoral head and neck removed. The  femoral broach was removed. Size 7 Actis stem with a high offset  neck was then impacted into the femur following native anteversion. Has  excellent purchase in the canal. Excellent torsional and rotational and  axial stability. It is confirmed to be in the canal on AP and lateral  fluoroscopic views. The 32 + 5 metal head was placed and the hip  reduced with outstanding stability. Again AP pelvis was taken and it  confirmed that the leg lengths were equal. The wound was then copiously  irrigated with saline solution and the capsule reattached and repaired  with Ethibond suture. 30 ml of .25% Bupivicaine was  injected into the capsule and into the edge of the tensor fascia lata as well as subcutaneous tissue. The fascia overlying the tensor fascia lata was then closed with a running #1 V-Loc. Subcu was closed with interrupted 2-0 Vicryl and subcuticular running 4-0 Monocryl. Incision was cleaned  and dried. Steri-Strips and a bulky sterile dressing applied. Hemovac  drain was hooked to suction and then the patient was awakened and transported to  recovery in stable condition.        Please note that a surgical assistant was a medical necessity for this procedure to perform it in a safe and expeditious manner. Assistant was necessary to provide appropriate retraction of vital neurovascular structures and to prevent femoral fracture and allow for anatomic placement of the prosthesis.  [name], M.D.

## 2020-11-10 NOTE — Anesthesia Postprocedure Evaluation (Signed)
Anesthesia Post Note  Patient: Adrian Davis  Procedure(s) Performed: TOTAL HIP ARTHROPLASTY ANTERIOR APPROACH (Right Hip)     Patient location during evaluation: PACU Anesthesia Type: Spinal Level of consciousness: awake and alert and oriented Pain management: pain level controlled Vital Signs Assessment: post-procedure vital signs reviewed and stable Respiratory status: spontaneous breathing, nonlabored ventilation and respiratory function stable Cardiovascular status: blood pressure returned to baseline Postop Assessment: no apparent nausea or vomiting, spinal receding, no headache and no backache Anesthetic complications: no   No complications documented.  Last Vitals:  Vitals:   11/10/20 1415 11/10/20 1430  BP: (!) 158/71 (!) 154/76  Pulse: 62 64  Resp: 11 10  Temp:    SpO2: 95% 96%    Last Pain:  Vitals:   11/10/20 1430  TempSrc:   PainSc: 0-No pain                 Brennan Bailey

## 2020-11-10 NOTE — Transfer of Care (Signed)
Immediate Anesthesia Transfer of Care Note  Patient: Adrian Davis  Procedure(s) Performed: TOTAL HIP ARTHROPLASTY ANTERIOR APPROACH (Right Hip)  Patient Location: PACU  Anesthesia Type:Spinal  Level of Consciousness: awake, alert  and patient cooperative  Airway & Oxygen Therapy: Patient Spontanous Breathing and Patient connected to face mask oxygen  Post-op Assessment: Report given to RN and Post -op Vital signs reviewed and stable  Post vital signs: Reviewed and stable  Last Vitals:  Vitals Value Taken Time  BP 111/66 11/10/20 1228  Temp    Pulse 81 11/10/20 1230  Resp 11 11/10/20 1230  SpO2 100 % 11/10/20 1230  Vitals shown include unvalidated device data.  Last Pain:  Vitals:   11/10/20 0931  TempSrc:   PainSc: 3       Patients Stated Pain Goal: 3 (16/10/96 0454)  Complications: No complications documented.

## 2020-11-11 ENCOUNTER — Encounter (HOSPITAL_COMMUNITY): Payer: Self-pay | Admitting: Orthopedic Surgery

## 2020-11-11 DIAGNOSIS — Z79899 Other long term (current) drug therapy: Secondary | ICD-10-CM | POA: Diagnosis not present

## 2020-11-11 DIAGNOSIS — I10 Essential (primary) hypertension: Secondary | ICD-10-CM | POA: Diagnosis not present

## 2020-11-11 DIAGNOSIS — Z7982 Long term (current) use of aspirin: Secondary | ICD-10-CM | POA: Diagnosis not present

## 2020-11-11 DIAGNOSIS — F17220 Nicotine dependence, chewing tobacco, uncomplicated: Secondary | ICD-10-CM | POA: Diagnosis not present

## 2020-11-11 DIAGNOSIS — Z85828 Personal history of other malignant neoplasm of skin: Secondary | ICD-10-CM | POA: Diagnosis not present

## 2020-11-11 DIAGNOSIS — M1611 Unilateral primary osteoarthritis, right hip: Secondary | ICD-10-CM | POA: Diagnosis not present

## 2020-11-11 LAB — BASIC METABOLIC PANEL
Anion gap: 9 (ref 5–15)
BUN: 17 mg/dL (ref 8–23)
CO2: 22 mmol/L (ref 22–32)
Calcium: 8.7 mg/dL — ABNORMAL LOW (ref 8.9–10.3)
Chloride: 106 mmol/L (ref 98–111)
Creatinine, Ser: 0.77 mg/dL (ref 0.61–1.24)
GFR, Estimated: >60 mL/min (ref >60)
Glucose, Bld: 178 mg/dL — ABNORMAL HIGH (ref 70–99)
Potassium: 4 mmol/L (ref 3.5–5.1)
Sodium: 137 mmol/L (ref 135–145)

## 2020-11-11 LAB — CBC
HCT: 33.6 % — ABNORMAL LOW (ref 39.0–52.0)
Hemoglobin: 11.1 g/dL — ABNORMAL LOW (ref 13.0–17.0)
MCH: 31.5 pg (ref 26.0–34.0)
MCHC: 33 g/dL (ref 30.0–36.0)
MCV: 95.5 fL (ref 80.0–100.0)
Platelets: 214 10*3/uL (ref 150–400)
RBC: 3.52 MIL/uL — ABNORMAL LOW (ref 4.22–5.81)
RDW: 13.3 % (ref 11.5–15.5)
WBC: 14.5 10*3/uL — ABNORMAL HIGH (ref 4.0–10.5)
nRBC: 0 % (ref 0.0–0.2)

## 2020-11-11 MED ORDER — TRAMADOL HCL 50 MG PO TABS: 50.0000 mg | ORAL_TABLET | Freq: Four times a day (QID) | ORAL | 0 refills | Status: DC | PRN

## 2020-11-11 MED ORDER — RIVAROXABAN 10 MG PO TABS
10.0000 mg | ORAL_TABLET | Freq: Every day | ORAL | 0 refills | Status: DC
Start: 2020-11-11 — End: 2021-02-02

## 2020-11-11 MED ORDER — METHOCARBAMOL 500 MG PO TABS: 500.0000 mg | ORAL_TABLET | Freq: Four times a day (QID) | ORAL | 0 refills | Status: DC | PRN

## 2020-11-11 MED ORDER — HYDROCODONE-ACETAMINOPHEN 5-325 MG PO TABS: 1.0000 | ORAL_TABLET | ORAL | 0 refills | Status: DC | PRN

## 2020-11-11 NOTE — Progress Notes (Signed)
   Subjective: 1 Day Post-Op Procedure(s) (LRB): TOTAL HIP ARTHROPLASTY ANTERIOR APPROACH (Right) Patient reports pain as mild.   Patient seen in rounds with Dr. Wynelle Link. Patient is well, and has had no acute complaints or problems. States the hip is feeling very well this AM, other than some minor soreness in the lateral thigh. Did extremely well with physical therapy yesterday. No issues overnight, foley catheter removed this AM. States he is ready to go home. We will continue therapy today.   Objective: Vital signs in last 24 hours: Temp:  [97.5 F (36.4 C)-98.1 F (36.7 C)] 97.6 F (36.4 C) (01/20 0515) Pulse Rate:  [62-105] 83 (01/20 0515) Resp:  [10-18] 17 (01/20 0515) BP: (111-164)/(68-90) 140/82 (01/20 0515) SpO2:  [94 %-100 %] 97 % (01/20 0515) Weight:  [78.9 kg] 78.9 kg (01/19 0931)  Intake/Output from previous day:  Intake/Output Summary (Last 24 hours) at 11/11/2020 0701 Last data filed at 11/11/2020 0636 Gross per 24 hour  Intake 2678 ml  Output 3450 ml  Net -772 ml     Intake/Output this shift: No intake/output data recorded.  Labs: Recent Labs    11/11/20 0349  HGB 11.1*   Recent Labs    11/11/20 0349  WBC 14.5*  RBC 3.52*  HCT 33.6*  PLT 214   Recent Labs    11/11/20 0349  NA 137  K 4.0  CL 106  CO2 22  BUN 17  CREATININE 0.77  GLUCOSE 178*  CALCIUM 8.7*   No results for input(s): LABPT, INR in the last 72 hours.  Exam: General - Patient is Alert and Oriented Extremity - Neurologically intact Neurovascular intact Sensation intact distally Dorsiflexion/Plantar flexion intact Dressing - dressing C/D/I Motor Function - intact, moving foot and toes well on exam.   Past Medical History:  Diagnosis Date  . Abnormal echocardiogram Jan 2012   grade 1 diastolic dysfunction, normal EF, mild LVH  . Anemia   . Basal cell carcinoma 04/08/2019   left nose inferior to nasal alar groove. EDC: 05/20/2019  . Bell palsy 10/2010   presented with  facial numbness  . 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 Degenerative disk disease   . GERD (gastroesophageal reflux disease)   . History of kidney stones   . Hyperlipidemia   . Hypertension    dr [name]  . Kidney cysts   . Lumbar stenosis   . Nephrolithiasis   . Normal nuclear stress test 2008  . Osteoarthritis   . Skin cancer   . Vocal cord leukoplakia    Dr Erik Obey    Assessment/Plan: 1 Day Post-Op Procedure(s) (LRB): TOTAL HIP ARTHROPLASTY ANTERIOR APPROACH (Right) Principal Problem:   OA (osteoarthritis) of hip Active Problems:   Primary osteoarthritis of right hip  Estimated body mass index is 26.46 kg/m as calculated from the following:   Height as of this encounter: 5\' 8"  (1.727 m).   Weight as of this encounter: 78.9 kg. Advance diet Up with therapy D/C IV fluids  DVT Prophylaxis - Xarelto Weight bearing as tolerated. Continue therapy.  Plan is to go Home after hospital stay.  Plan for discharge after one session of PT if meeting goals with HEP. Follow-up in the office February 1st.  The PDMP database was reviewed today prior to any opioid medications being prescribed to this patient.  Theresa Duty, PA-C Orthopedic Surgery [phone] 11/11/2020, 7:01 AM

## 2020-11-11 NOTE — Progress Notes (Signed)
Physical Therapy Treatment Patient Details Name: Adrian Davis MRN: [id number] DOB: [birth year] Today's Date: 11/11/2020    History of Present Illness Patient is a 80 yo male s/p R THA ant approach on 11/10/2020 with PMH significant for OA, HTN, hyperlipidemia, GERD, anemia, bell palsy, abnormal echocardiogram, BPH, DDD, vocal cord leukoplakia, and B shoulder surgery.    PT Comments    Pt progressing toward acute PT goals with increased ambulation distance and ability to perform safe stair negotiation with MIN assist for safety and RW stability and verbal cues for sequencing. Reviewed LE HEP with no complaints of increased pain or fatigue. Pt is at safe mobility level for return home and will have assistance from his wife and son.  Acute therapy to follow up during stay.   Follow Up Recommendations  Follow surgeon's recommendation for DC plan and follow-up therapies     Equipment Recommendations  Rolling walker with 5" wheels    Recommendations for Other Services       Precautions / Restrictions Precautions Precautions: Fall Restrictions Weight Bearing Restrictions: No Other Position/Activity Restrictions: WBAT    Mobility  Bed Mobility Overal bed mobility: Needs Assistance Bed Mobility: Supine to Sit     Supine to sit: Supervision;HOB elevated     General bed mobility comments: Pt was able to use bed rails and UEs to scoot to EOB with supervision for safety.  Transfers Overall transfer level: Needs assistance Equipment used: Rolling walker (2 wheeled) Transfers: Sit to/from Stand Sit to Stand: Min guard         General transfer comment: Pt was able to transfer sit to stand with MIN guard for safety and verbal cues for hand placement. Pt steady following rise from bed.  Ambulation/Gait Ambulation/Gait assistance: Min guard Gait Distance (Feet): 180 Feet Assistive device: Rolling walker (2 wheeled) Gait Pattern/deviations: Step-through pattern;Decreased weight  shift to right;Decreased stride length Gait velocity: decr   General Gait Details: Pt ambulated with MIN guard for safety and verbal cues for RW management. No observed LOB.   Stairs Stairs: Yes Stairs assistance: Min assist Stair Management: No rails;Forwards;With walker Number of Stairs: 3 General stair comments: Pt safely negotiated stairs with MIN assist for safety and RW stability. Pt required verbal cues for sequencing. EOS reviewed safe guarding position for family on stairs and pt verbalized understanding.   Wheelchair Mobility    Modified Rankin (Stroke Patients Only)       Balance Overall balance assessment: Needs assistance Sitting-balance support: Feet supported;No upper extremity supported Sitting balance-Leahy Scale: Good     Standing balance support: Bilateral upper extremity supported Standing balance-Leahy Scale: Poor Standing balance comment: Pt required use of RW to maintain standing balance.                            Cognition Arousal/Alertness: Awake/alert Behavior During Therapy: WFL for tasks assessed/performed Overall Cognitive Status: Within Functional Limits for tasks assessed                                        Exercises Total Joint Exercises Quad Sets: AROM;Right;10 reps;Seated Short Arc Quad: AROM;Right;10 reps;Seated Heel Slides: AROM;10 reps;Seated;Right Hip ABduction/ADduction: AROM;Right;10 reps;Seated Long Arc Quad: AROM;Right;15 reps;Seated    General Comments        Pertinent Vitals/Pain Pain Assessment: Faces Faces Pain Scale: Hurts little more Pain Location: Rt  hip Pain Descriptors / Indicators: Sore Pain Intervention(s): Limited activity within patient's tolerance;Monitored during session;Repositioned;Ice applied    Home Living                      Prior Function            PT Goals (current goals can now be found in the care plan section) Acute Rehab PT Goals Patient  Stated Goal: 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 PT Goal Formulation: With patient/family Time For Goal Achievement: 11/17/20 Potential to Achieve Goals: Good Progress towards PT goals: Progressing toward goals    Frequency    7X/week      PT Plan Current plan remains appropriate    Co-evaluation              AM-PAC PT "6 Clicks" Mobility   Outcome Measure  Help needed turning from your back to your side while in a flat bed without using bedrails?: A Little Help needed moving from lying on your back to sitting on the side of a flat bed without using bedrails?: A Little Help needed moving to and from a bed to a chair (including a wheelchair)?: A Little Help needed standing up from a chair using your arms (e.g., wheelchair or bedside chair)?: A Little Help needed to walk in hospital room?: A Little Help needed climbing 3-5 steps with a railing? : A Little 6 Click Score: 18    End of Session Equipment Utilized During Treatment: Gait belt Activity Tolerance: Patient tolerated treatment well Patient left: in chair;with chair alarm set;with family/visitor present Nurse Communication: Mobility status PT Visit Diagnosis: Unsteadiness on feet (R26.81);Muscle weakness (generalized) (M62.81);Pain Pain - Right/Left: Right Pain - part of body: Hip     Time: 1740-8144 PT Time Calculation (min) (ACUTE ONLY): 29 min  Charges:                        Elna Breslow, SPT  Acute rehab    Elna Breslow 11/11/2020, 10:32 AM

## 2020-11-11 NOTE — Plan of Care (Signed)
All discharge instructions were given to the Pt and his spouse. Discussed pain management. All questions were answered.

## 2020-11-15 NOTE — Discharge Summary (Signed)
Physician Discharge Summary   Patient ID: Adrian Davis MRN: [id number] DOB/AGE: Jan 03, 1941 80 y.o.  Admit date: 11/10/2020 Discharge date: 11/11/2020  Primary Diagnosis: Osteoarthritis, right hip   Admission Diagnoses:  Past Medical History:  Diagnosis Date  . Abnormal echocardiogram Jan 2012   grade 1 diastolic dysfunction, normal EF, mild LVH  . Anemia   . Basal cell carcinoma 04/08/2019   left nose inferior to nasal alar groove. EDC: 05/20/2019  . Bell palsy 10/2010   presented with facial numbness  . 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 Degenerative disk disease   . GERD (gastroesophageal reflux disease)   . History of kidney stones   . Hyperlipidemia   . Hypertension    dr [name]  . Kidney cysts   . Lumbar stenosis   . Nephrolithiasis   . Normal nuclear stress test 2008  . Osteoarthritis   . Skin cancer   . Vocal cord leukoplakia    Dr Erik Obey   Discharge Diagnoses:   Principal Problem:   OA (osteoarthritis) of hip Active Problems:   Primary osteoarthritis of right hip  Estimated body mass index is 26.46 kg/m as calculated from the following:   Height as of this encounter: 5\' 8"  (1.727 m).   Weight as of this encounter: 78.9 kg.  Procedure:  Procedure(s) (LRB): TOTAL HIP ARTHROPLASTY ANTERIOR APPROACH (Right)   Consults: None  HPI: Adrian Davis is a 80 y.o. male who has advanced rapidly progessive end-stage arthritis of their Right  hip with progressively worsening pain and dysfunction.The patient has failed nonoperative management and presents for total hip arthroplasty.   Laboratory Data: Admission on 11/10/2020, Discharged on 11/11/2020  Component Date Value Ref Range Status  . ABO/RH(D) 11/10/2020    Final                   Value:A POS Performed at [facility], [place] [address] [place], [place] [address]   . WBC 11/11/2020 14.5* 4.0 - 10.5 K/uL Final  . RBC 11/11/2020 3.52* 4.22 - 5.81 MIL/uL Final   . Hemoglobin 11/11/2020 11.1* 13.0 - 17.0 g/dL Final  . HCT 11/11/2020 33.6* 39.0 - 52.0 % Final  . MCV 11/11/2020 95.5  80.0 - 100.0 fL Final  . MCH 11/11/2020 31.5  26.0 - 34.0 pg Final  . MCHC 11/11/2020 33.0  30.0 - 36.0 g/dL Final  . RDW 11/11/2020 13.3  11.5 - 15.5 % Final  . Platelets 11/11/2020 214  150 - 400 K/uL Final  . nRBC 11/11/2020 0.0  0.0 - 0.2 % Final   Performed at [facility], [place] [address], [place], [place] [address]  . Sodium 11/11/2020 137  135 - 145 mmol/L Final  . Potassium 11/11/2020 4.0  3.5 - 5.1 mmol/L Final  . Chloride 11/11/2020 106  98 - 111 mmol/L Final  . CO2 11/11/2020 22  22 - 32 mmol/L Final  . Glucose, Bld 11/11/2020 178* 70 - 99 mg/dL Final   Glucose reference range applies only to samples taken after fasting for at least 8 hours.  . BUN 11/11/2020 17  8 - 23 mg/dL Final  . Creatinine, Ser 11/11/2020 0.77  0.61 - 1.24 mg/dL Final  . Calcium 11/11/2020 8.7* 8.9 - 10.3 mg/dL Final  . GFR, Estimated 11/11/2020 >60  >60 mL/min Final   Comment: (NOTE) Calculated using the CKD-EPI Creatinine Equation (2021)   . Anion gap 11/11/2020 9  5 - 15 Final  Performed at [facility], [place] [address] [place], [place] [address]  Hospital Outpatient Visit on 11/06/2020  Component Date Value Ref Range Status  . SARS Coronavirus 2 11/06/2020 NEGATIVE  NEGATIVE Final   Comment: (NOTE) SARS-CoV-2 target nucleic acids are NOT DETECTED.  The SARS-CoV-2 RNA is generally detectable in upper and lower respiratory specimens during the acute phase of infection. Negative results do not preclude SARS-CoV-2 infection, do not rule out co-infections with other pathogens, and should not be used as the sole basis for treatment or other patient management decisions. Negative results must be combined with clinical observations, patient history, and epidemiological information. The expected result is Negative.  Fact Sheet for  Patients: SugarRoll.be  Fact Sheet for Healthcare Providers: https://www.woods-mathews.com/  This test is not yet approved or cleared by the [place] FDA and  has been authorized for detection and/or diagnosis of SARS-CoV-2 by FDA under an Emergency Use Authorization (EUA). This EUA will remain  in effect (meaning this test can be used) for the duration of the COVID-19 declaration under Se                          ction 564(b)(1) of the Act, 21 U.S.C. section 360bbb-3(b)(1), unless the authorization is terminated or revoked sooner.  Performed at [facility], [place] [address], [place], [place] [address]   Hospital Outpatient Visit on 11/05/2020  Component Date Value Ref Range Status  . MRSA, PCR 11/05/2020 NEGATIVE  NEGATIVE Final  . Staphylococcus aureus 11/05/2020 NEGATIVE  NEGATIVE Final   Comment: (NOTE) The Xpert SA Assay (FDA approved for NASAL specimens in patients 77 years of age and older), is one component of a comprehensive surveillance program. It is not intended to diagnose infection nor to guide or monitor treatment. Performed at [facility], [place] [address], [place], [place] [address]   . aPTT 11/05/2020 34  24 - 36 seconds Final   Performed at [facility], [place] [address], [place], [place] [address]  . ABO/RH(D) 11/05/2020 A POS   Final  . Antibody Screen 11/05/2020 NEG   Final  . Sample Expiration 11/05/2020 11/13/2020,2359   Final  . Extend sample reason 11/05/2020    Final                   Value:NO TRANSFUSIONS OR PREGNANCY IN THE PAST 3 MONTHS Performed at [place] [address] [place], [place] [address]   Office Visit on 11/02/2020  Component Date Value Ref Range Status  . WBC 11/02/2020 5.8  4.0 - 10.5 K/uL Final  . RBC 11/02/2020 4.49  4.22 - 5.81 Mil/uL Final  . Hemoglobin 11/02/2020 14.1  13.0 - 17.0 g/dL Final  . HCT 11/02/2020 41.6   39.0 - 52.0 % Final  . MCV 11/02/2020 92.7  78.0 - 100.0 fl Final  . MCHC 11/02/2020 33.8  30.0 - 36.0 g/dL Final  . RDW 11/02/2020 14.3  11.5 - 15.5 % Final  . Platelets 11/02/2020 239.0  150.0 - 400.0 K/uL Final  . Neutrophils Relative % 11/02/2020 58.9  43.0 - 77.0 % Final  . Lymphocytes Relative 11/02/2020 29.2  12.0 - 46.0 % Final  . Monocytes Relative 11/02/2020 9.3  3.0 - 12.0 % Final  . Eosinophils Relative 11/02/2020 2.0  0.0 - 5.0 % Final  . Basophils Relative 11/02/2020 0.6  0.0 - 3.0 % Final  . Neutro Abs 11/02/2020 3.4  1.4 - 7.7 K/uL Final  . Lymphs Abs 11/02/2020 1.7  0.7 - 4.0 K/uL Final  . Monocytes Absolute 11/02/2020 0.5  0.1 - 1.0 K/uL Final  . Eosinophils Absolute 11/02/2020 0.1  0.0 - 0.7 K/uL Final  . Basophils Absolute 11/02/2020 0.0  0.0 - 0.1 K/uL Final  . Sodium 11/02/2020 138  135 - 145 mEq/L Final  . Potassium 11/02/2020 4.0  3.5 - 5.1 mEq/L Final  . Chloride 11/02/2020 105  96 - 112 mEq/L Final  . CO2 11/02/2020 30  19 - 32 mEq/L Final  . Glucose, Bld 11/02/2020 89  70 - 99 mg/dL Final  . BUN 11/02/2020 22  6 - 23 mg/dL Final  . Creatinine, Ser 11/02/2020 0.74  0.40 - 1.50 mg/dL Final  . Total Bilirubin 11/02/2020 0.4  0.2 - 1.2 mg/dL Final  . Alkaline Phosphatase 11/02/2020 55  39 - 117 U/L Final  . AST 11/02/2020 12  0 - 37 U/L Final  . ALT 11/02/2020 14  0 - 53 U/L Final  . Total Protein 11/02/2020 6.6  6.0 - 8.3 g/dL Final  . Albumin 11/02/2020 4.3  3.5 - 5.2 g/dL Final  . GFR 11/02/2020 86.83  >60.00 mL/min Final   Calculated using the CKD-EPI Creatinine Equation (2021)  . Calcium 11/02/2020 9.7  8.4 - 10.5 mg/dL Final  . INR 11/02/2020 1.0  0.8 - 1.0 ratio Final  . Prothrombin Time 11/02/2020 11.7  9.6 - 13.1 sec Final  . Hgb A1c MFr Bld 11/02/2020 5.8  4.6 - 6.5 % Final   Glycemic Control Guidelines for People with Diabetes:Non Diabetic:  <6%Goal of Therapy: <7%Additional Action Suggested:  >8%   . Color, UA 11/02/2020 yellow   Final  .  Clarity, UA 11/02/2020 clear   Final  . Glucose, UA 11/02/2020 Negative  Negative Final  . Bilirubin, UA 11/02/2020 negative   Final  . Ketones, UA 11/02/2020 negative   Final  . Spec Grav, UA 11/02/2020 >=1.030* 1.010 - 1.025 Final  . Blood, UA 11/02/2020 negative   Final  . pH, UA 11/02/2020 5.0  5.0 - 8.0 Final  . Protein, UA 11/02/2020 Negative  Negative Final  . Urobilinogen, UA 11/02/2020 0.2  0.2 or 1.0 E.U./dL Final  . Nitrite, UA 11/02/2020 negative   Final  . Leukocytes, UA 11/02/2020 Negative  Negative Final     X-Rays:DG Chest 2 View  Result Date: 11/02/2020 CLINICAL DATA:  Preop evaluation EXAM: CHEST - 2 VIEW COMPARISON:  None. FINDINGS: The heart size and mediastinal contours are within normal limits. Both lungs are clear. The visualized skeletal structures are unremarkable. IMPRESSION: No active cardiopulmonary disease. Electronically Signed   By: Miachel Roux M.D.   On: 11/02/2020 10:58   DG Pelvis Portable  Result Date: 11/10/2020 CLINICAL DATA:  Right hip arthroplasty. EXAM: PORTABLE PELVIS 1-2 VIEWS COMPARISON:  11/10/2020 the same day. FINDINGS: Interval right total hip arthroplasty. Subcutaneous and joint air is present. Left hip is grossly unremarkable. IMPRESSION: Interval right total hip arthroplasty with expected postoperative findings. Electronically Signed   By: Lorin Picket M.D.   On: 11/10/2020 13:24   DG C-Arm 1-60 Min-No Report  Result Date: 11/10/2020 Fluoroscopy was utilized by the requesting physician.  No radiographic interpretation.   DG HIP OPERATIVE UNILAT W OR W/O PELVIS RIGHT  Result Date: 11/10/2020 CLINICAL DATA:  Hip replacement. EXAM: OPERATIVE RIGHT HIP (WITH PELVIS IF PERFORMED) 4 VIEWS TECHNIQUE: Fluoroscopic spot image(s) were submitted for interpretation post-operatively. COMPARISON:  None. FINDINGS:  Fluoro time: 13 seconds. Four C-arm fluoroscopic images were obtained intraoperatively and submitted for post operative interpretation.  These images demonstrate postsurgical changes of right total hip arthroplasty. No unexpected findings. Please see the performing provider's procedural report for further detail. IMPRESSION: Intraoperative fluoroscopic imaging, as detailed above. Electronically Signed   By: Margaretha Sheffield MD   On: 11/10/2020 12:10    EKG: Orders placed or performed in visit on 05/14/20  . EKG 12-Lead     Hospital Course: Adrian Davis is a 80 y.o. who was admitted to [facility]. They were brought to the operating room on 11/10/2020 and underwent Procedure(s): [place].  Patient tolerated the procedure well and was later transferred to the recovery room and then to the orthopaedic floor for postoperative care. They were given PO and IV analgesics for pain control following their surgery. They were given 24 hours of postoperative antibiotics of  Anti-infectives (From admission, onward)   Start     Dose/Rate Route Frequency Ordered Stop   11/10/20 1700  ceFAZolin (ANCEF) IVPB 2g/100 mL premix        2 g 200 mL/hr over 30 Minutes Intravenous Every 6 hours 11/10/20 1456 11/11/20 0030   11/10/20 0915  ceFAZolin (ANCEF) IVPB 2g/100 mL premix        2 g 200 mL/hr over 30 Minutes Intravenous On call to O.R. 11/10/20 0901 11/10/20 1053     and started on DVT prophylaxis in the form of Xarelto.   PT and OT were ordered for total joint protocol. Discharge planning consulted to help with postop disposition and equipment needs.  Patient had a good night on the evening of surgery. They started to get up OOB with therapy on POD #0. Pt was seen during rounds and was ready to go home pending progress with therapy. He worked with therapy on POD #1 and was meeting his goals. Pt was discharged to home later that day in stable condition.  Diet: Regular diet Activity: WBAT Follow-up: in 2 weeks Disposition: Home with HEP Discharged Condition: stable   Discharge Instructions    Call  MD / Call 911   Complete by: As directed    If you experience chest pain or shortness of breath, CALL 911 and be transported to the hospital emergency room.  If you develope a fever above 101 F, pus (white drainage) or increased drainage or redness at the wound, or calf pain, call your surgeon's office.   Change dressing   Complete by: As directed    You have an adhesive waterproof bandage over the incision. Leave this in place until your first follow-up appointment. Once you remove this you will not need to place another bandage.   Constipation Prevention   Complete by: As directed    Drink plenty of fluids.  Prune juice may be helpful.  You may use a stool softener, such as Colace (over the counter) 100 mg twice a day.  Use MiraLax (over the counter) for constipation as needed.   Diet - low sodium heart healthy   Complete by: As directed    Do not sit on low chairs, stoools or toilet seats, as it may be difficult to get up from low surfaces   Complete by: As directed    Driving restrictions   Complete by: As directed    No driving for two weeks   TED hose   Complete by: As directed    Use stockings (TED hose) for  three weeks on both leg(s).  You may remove them at night for sleeping.   Weight bearing as tolerated   Complete by: As directed      Allergies as of 11/11/2020   No Known Allergies     Medication List    STOP taking these medications   aspirin EC 81 MG tablet   diclofenac Sodium 1 % Gel Commonly known as: VOLTAREN   meloxicam 15 MG tablet Commonly known as: MOBIC     TAKE these medications   acetaminophen 650 MG CR tablet Commonly known as: TYLENOL Take 650 mg by mouth every 8 (eight) hours as needed for pain.   amLODipine 2.5 MG tablet Commonly known as: NORVASC TAKE 2 TABLETS BY MOUTH EVERY MORNING AND 1 IN THE AFTERNOON AS DIRECTED What changed:   how much to take  how to take this  when to take this   B-12 1000 MCG Subl Place 1 tablet under the  tongue every other day.   benazepril 20 MG tablet Commonly known as: LOTENSIN TAKE 1 TABLET(20 MG) BY MOUTH TWICE DAILY What changed: See the new instructions.   diphenhydrAMINE 25 MG tablet Commonly known as: BENADRYL Take 25 mg by mouth at bedtime.   docusate sodium 100 MG capsule Commonly known as: Colace Take 1 capsule (100 mg total) by mouth 2 (two) times daily.   famotidine 20 MG tablet Commonly known as: PEPCID Take 1 tablet (20 mg total) by mouth at bedtime.   HYDROcodone-acetaminophen 5-325 MG tablet Commonly known as: NORCO/VICODIN Take 1-2 tablets by mouth every 4 (four) hours as needed for severe pain.   methocarbamol 500 MG tablet Commonly known as: ROBAXIN Take 1 tablet (500 mg total) by mouth every 6 (six) hours as needed for muscle spasms.   mirabegron ER 25 MG Tb24 tablet Commonly known as: Myrbetriq Take 1 tablet (25 mg total) by mouth daily.   pantoprazole 40 MG tablet Commonly known as: PROTONIX Take 1 tablet (40 mg total) by mouth daily.   polyethylene glycol powder 17 GM/SCOOP powder Commonly known as: GLYCOLAX/MIRALAX TAKE 17 GRAMS BY MOUTH TWICE DAILY What changed: See the new instructions.   potassium chloride 10 MEQ tablet Commonly known as: KLOR-CON Take 10 mEq by mouth daily.   rivaroxaban 10 MG Tabs tablet Commonly known as: XARELTO Take 1 tablet (10 mg total) by mouth daily with breakfast for 20 days. Then resume one 81 mg Aspirin once a day.   sildenafil 20 MG tablet Commonly known as: REVATIO Take 2 to 5 tablets as needed What changed:   how much to take  how to take this  when to take this  reasons to take this   simvastatin 10 MG tablet Commonly known as: ZOCOR Take 1 tablet (10 mg total) by mouth daily at 6 PM.   tamsulosin 0.4 MG Caps capsule Commonly known as: FLOMAX Take 1 capsule (0.4 mg total) by mouth daily after supper. Pt has stopped on his own.   traMADol 50 MG tablet Commonly known as: ULTRAM Take 1-2  tablets (50-100 mg total) by mouth every 6 (six) hours as needed for moderate pain. What changed:   how much to take  when to take this  reasons to take this            Discharge Care Instructions  (From admission, onward)         Start     Ordered   11/11/20 0000  Weight bearing as tolerated  11/11/20 0722   11/11/20 0000  Change dressing       Comments: You have an adhesive waterproof bandage over the incision. Leave this in place until your first follow-up appointment. Once you remove this you will not need to place another bandage.   11/11/20 0626          Follow-up Information    [name], MD. Go on 11/23/2020.   Specialty: Orthopedic Surgery Why: You are scheduled for first post op appointment on Tuesday February 1st at 3:15pm. Contact information: [address] [place] [address] [phone]               Signed: Theresa Duty, PA-C Orthopedic Surgery 11/15/2020, 8:15 AM

## 2020-11-18 NOTE — Progress Notes (Signed)
Virtual Visit via Telephone Note   This visit type was conducted due to national recommendations for restrictions regarding the COVID-19 Pandemic (e.g. social distancing) in an effort to limit this patient's exposure and mitigate transmission in our community.  Due to his co-morbid illnesses, this patient is at least at moderate risk for complications without adequate follow up.  This format is felt to be most appropriate for this patient at this time.  The patient did not have access to video technology/had technical difficulties with video requiring transitioning to audio format only (telephone).  All issues noted in this document were discussed and addressed.  No physical exam could be performed with this format.  Please refer to the patient's chart for his  consent to telehealth for [facility].    Date:  11/18/2020   ID:  Adrian Davis, DOB 25-Apr-1941, MRN [id number] The patient was identified using 2 identifiers.  Patient Location: Home Provider Location: Home Office  PCP:  [name], MD  Cardiologist:  Milo Solana Martinique MD Electrophysiologist:  None   Evaluation Performed:  Follow-Up Visit  Chief Complaint:  HTN  History of Present Illness:    Adrian Davis is a 80 y.o. male with  history of HTN and HLD. No known CAD. He had a normal Adenosine Myoview back in 2008. His last echo was in January of 2012 showing grade 1 diastolic dysfunction, mild LVH and a normal EF.    He recently underwent right THR on 11/10/20 by [name]. He is still experiencing a fair amount of pain. He is on Xarelto for DVT prophylaxis. BP at home has been well controlled.   The patient does not have symptoms concerning for COVID-19 infection (fever, chills, cough, or new shortness of breath).    Past Medical History:  Diagnosis Date  . Abnormal echocardiogram Jan 2012   grade 1 diastolic dysfunction, normal EF, mild LVH  . Anemia   . Basal cell carcinoma 04/08/2019   left nose inferior to  nasal alar groove. EDC: 05/20/2019  . Bell palsy 10/2010   presented with facial numbness  . 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 Degenerative disk disease   . GERD (gastroesophageal reflux disease)   . History of kidney stones   . Hyperlipidemia   . Hypertension    dr Lorian Yaun [place]  . Kidney cysts   . Lumbar stenosis   . Nephrolithiasis   . Normal nuclear stress test 2008  . Osteoarthritis   . Skin cancer   . Vocal cord leukoplakia    Dr Erik Obey   Past Surgical History:  Procedure Laterality Date  . COLONOSCOPY  04/2019   TAx2, hemorrhoids, rpt 70yrs ([place])  . ESOPHAGOGASTRODUODENOSCOPY  04/2019   erosive gastropathy, biopsy negative for barrett's ([place])  . KNEE ARTHROSCOPY Right   . LUMBAR LAMINECTOMY/DECOMPRESSION MICRODISCECTOMY N/A 11/12/2013   Ophelia Charter, MD - complicated by urinary retention  . SHOULDER SURGERY Bilateral   . TONSILLECTOMY    . 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   Procedure: TOTAL HIP ARTHROPLASTY ANTERIOR APPROACH;  Surgeon: [name], MD;  Location: [facility];  Service: Orthopedics;  Laterality: Right;  153min     No outpatient medications have been marked as taking for the 11/19/20 encounter (Appointment) with Martinique, Shamarie Call M, MD.     Allergies:   Patient has no known allergies.   Social History   Tobacco Use  . Smoking status: Former Smoker    Packs/day: 1.50  Years: 10.00    Pack years: 15.00    Types: Cigarettes    Quit date: 05/15/1976    Years since quitting: 44.5  . Smokeless tobacco: Current User    Types: Chew  . Tobacco comment: chews tobacco  Vaping Use  . Vaping Use: Never used  Substance Use Topics  . Alcohol use: Yes    Comment: rare  . Drug use: No     Family Hx: The patient's family history includes Anemia in his mother; Arthritis in his father; Heart failure (age of onset: 16) in his father; Hypertension in his father; Prostate cancer in his son. There is no history of  Cancer, Diabetes, Colon cancer, Rectal cancer, Stomach cancer, Inflammatory bowel disease, Liver disease, or Pancreatic cancer.  ROS:   Please see the history of present illness.    All other systems reviewed and are negative.   Prior CV studies:   The following studies were reviewed today:  none  Labs/Other Tests and Data Reviewed:    EKG:  No ECG reviewed.  Recent Labs: 01/21/2020: TSH 2.42 11/02/2020: ALT 14 11/11/2020: BUN 17; Creatinine, Ser 0.77; Hemoglobin 11.1; Platelets 214; Potassium 4.0; Sodium 137   Recent Lipid Panel Lab Results  Component Value Date/Time   CHOL 154 05/05/2020 07:32 AM   TRIG 175 (H) 05/05/2020 07:32 AM   HDL 38 (L) 05/05/2020 07:32 AM   CHOLHDL 4.1 05/05/2020 07:32 AM   CHOLHDL 4 01/15/2018 08:49 AM   LDLCALC 86 05/05/2020 07:32 AM   LDLDIRECT 102.0 05/15/2011 08:09 AM    Wt Readings from Last 3 Encounters:  11/10/20 174 lb (78.9 kg)  11/05/20 174 lb (78.9 kg)  11/02/20 174 lb 5 oz (79.1 kg)     Risk Assessment/Calculations:      Objective:    Vital Signs:  There were no vitals taken for this visit.   VITAL SIGNS:  reviewed  ASSESSMENT & PLAN:    1. HTN well controlled.  Continue amlodipine and lotensin. Only uses HCTZ prn for swelling.  2. Hyperlipidemia. Labs look good. Continue current therapy  3. S/p THR by [name]. Has follow up with ortho next Tuesday. DVT prophylaxis per their protocol.          COVID-19 Education: The signs and symptoms of COVID-19 were discussed with the patient and how to seek care for testing (follow up with PCP or arrange E-visit).  The importance of social distancing was discussed today.  Time:   Today, I have spent 14 minutes with the patient with telehealth technology discussing the above problems.     Medication Adjustments/Labs and Tests Ordered: Current medicines are reviewed at length with the patient today.  Concerns regarding medicines are outlined above.   Tests Ordered: No  orders of the defined types were placed in this encounter.   Medication Changes: No orders of the defined types were placed in this encounter.   Follow Up:  In Person in 6 month(s)  Signed, Kema Santaella Martinique, MD  11/18/2020 2:47 PM    [place]

## 2020-11-19 ENCOUNTER — Telehealth: Payer: Self-pay

## 2020-11-19 ENCOUNTER — Telehealth (INDEPENDENT_AMBULATORY_CARE_PROVIDER_SITE_OTHER): Payer: Medicare Other | Admitting: Cardiology

## 2020-11-19 ENCOUNTER — Encounter: Payer: Self-pay | Admitting: Cardiology

## 2020-11-19 VITALS — BP 141/77 | HR 83 | Ht 68.0 in | Wt 178.0 lb

## 2020-11-19 DIAGNOSIS — E7800 Pure hypercholesterolemia, unspecified: Secondary | ICD-10-CM

## 2020-11-19 DIAGNOSIS — I10 Essential (primary) hypertension: Secondary | ICD-10-CM | POA: Diagnosis not present

## 2020-11-19 NOTE — Patient Instructions (Signed)
Medication Instructions:  Continue same medications *If you need a refill on your cardiac medications before your next appointment, please call your pharmacy*   Lab Work: None ordered   Testing/Procedures: None ordered   Follow-Up: At [facility], you and your health needs are our priority.  As part of our continuing mission to provide you with exceptional heart care, we have created designated Provider Care Teams.  These Care Teams include your primary Cardiologist (physician) and Advanced Practice Providers (APPs -  Physician Assistants and Nurse Practitioners) who all work together to provide you with the care you need, when you need it.  We recommend signing up for the patient portal called "MyChart".  Sign up information is provided on this After Visit Summary.  MyChart is used to connect with patients for Virtual Visits (Telemedicine).  Patients are able to view lab/test results, encounter notes, upcoming appointments, etc.  Non-urgent messages can be sent to your provider as well.   To learn more about what you can do with MyChart, go to [email or web address].    Your next appointment:  6 months     Call in March to schedule July appointment   The format for your next appointment: Office     Provider:  Dr.Jordan   

## 2020-11-19 NOTE — Telephone Encounter (Signed)
  Patient Consent for Virtual Visit         Adrian Davis has provided verbal consent on 11/19/2020 for a virtual visit (video or telephone).   CONSENT FOR VIRTUAL VISIT FOR:  Adrian Davis  By participating in this virtual visit I agree to the following:  I hereby voluntarily request, consent and authorize [facility] and its employed or contracted physicians, physician assistants, nurse practitioners or other licensed health care professionals (the Practitioner), to provide me with telemedicine health care services (the "Services") as deemed necessary by the treating Practitioner. I acknowledge and consent to receive the Services by the Practitioner via telemedicine. I understand that the telemedicine visit will involve communicating with the Practitioner through live audiovisual communication technology and the disclosure of certain medical information by electronic transmission. I acknowledge that I have been given the opportunity to request an in-person assessment or other available alternative prior to the telemedicine visit and am voluntarily participating in the telemedicine visit.  I understand that I have the right to withhold or withdraw my consent to the use of telemedicine in the course of my care at any time, without affecting my right to future care or treatment, and that the Practitioner or I may terminate the telemedicine visit at any time. I understand that I have the right to inspect all information obtained and/or recorded in the course of the telemedicine visit and may receive copies of available information for a reasonable fee.  I understand that some of the potential risks of receiving the Services via telemedicine include:  Marland Kitchen Delay or interruption in medical evaluation due to technological equipment failure or disruption; . Information transmitted may not be sufficient (e.g. poor resolution of images) to allow for appropriate medical decision making by the Practitioner;  and/or  . In rare instances, security protocols could fail, causing a breach of personal health information.  Furthermore, I acknowledge that it is my responsibility to provide information about my medical history, conditions and care that is complete and accurate to the best of my ability. I acknowledge that Practitioner's advice, recommendations, and/or decision may be based on factors not within their control, such as incomplete or inaccurate data provided by me or distortions of diagnostic images or specimens that may result from electronic transmissions. I understand that the practice of medicine is not an exact science and that Practitioner makes no warranties or guarantees regarding treatment outcomes. I acknowledge that a copy of this consent can be made available to me via my patient portal ([facility]), or I can request a printed copy by calling the office of [facility].    I understand that my insurance will be billed for this visit.   I have read or had this consent read to me. . I understand the contents of this consent, which adequately explains the benefits and risks of the Services being provided via telemedicine.  . I have been provided ample opportunity to ask questions regarding this consent and the Services and have had my questions answered to my satisfaction. . I give my informed consent for the services to be provided through the use of telemedicine in my medical care

## 2020-11-24 ENCOUNTER — Telehealth: Payer: Self-pay

## 2020-11-24 NOTE — Telephone Encounter (Signed)
Adrian Davis with [facility] left v/m requesting refill for famotidine 20 mg on med list 11/02/20 had listed as OTC and also note that as of 11/19/20 pt was not taking. Adrian Davis notified and voiced understanding and will talk with [name] pharmacist at [facility] nothing further needed at this time.

## 2020-12-01 ENCOUNTER — Other Ambulatory Visit: Payer: Self-pay | Admitting: Family Medicine

## 2020-12-02 NOTE — Telephone Encounter (Signed)
Meloxicam last filled:  11/02/20, #30 Myrbetriq last filled:  11/02/20, #30 Last OV:  11/02/20, pre- op eval Next OV:  none

## 2020-12-06 NOTE — Telephone Encounter (Signed)
meloxicam was stopped 10/2020 by ortho

## 2020-12-14 DIAGNOSIS — Z96641 Presence of right artificial hip joint: Secondary | ICD-10-CM | POA: Diagnosis not present

## 2020-12-14 DIAGNOSIS — Z471 Aftercare following joint replacement surgery: Secondary | ICD-10-CM | POA: Diagnosis not present

## 2021-01-03 ENCOUNTER — Telehealth: Payer: Self-pay | Admitting: Family Medicine

## 2021-01-03 ENCOUNTER — Other Ambulatory Visit: Payer: Self-pay | Admitting: *Deleted

## 2021-01-03 ENCOUNTER — Telehealth: Payer: Self-pay | Admitting: Cardiology

## 2021-01-03 MED ORDER — PANTOPRAZOLE SODIUM 40 MG PO TBEC: 40.0000 mg | DELAYED_RELEASE_TABLET | Freq: Two times a day (BID) | ORAL | 2 refills | Status: DC

## 2021-01-03 NOTE — Telephone Encounter (Signed)
  LAST APPOINTMENT DATE: 12/01/2020   NEXT APPOINTMENT DATE:@Visit  date not found  MEDICATION: Myrbetriq (requesting 3 month supply) States needs new prescription due to not being transferred to new pharmacy  PHARMACY: [facility] [phone]  Let patient know to contact pharmacy at the end of the day to make sure medication is ready.  Please notify patient to allow 48-72 hours to process  Encourage patient to contact the pharmacy for refills or they can request refills through [facility]:   LAST REFILL:  QTY:  REFILL DATE:    OTHER COMMENTS:    Okay for refill?  Please advise

## 2021-01-03 NOTE — Telephone Encounter (Signed)
°*  STAT* If patient is at the pharmacy, call can be transferred to refill team.   1. Which medications need to be refilled? (please list name of each medication and dose if known) pantoprazole (PROTONIX) 40 MG tablet  2. Which pharmacy/location (including street and city if local pharmacy) is medication to be sent to? [place] [facility]: [address] [place], [place] [address]  3. Do they need a 30 day or 90 day supply? [facility]

## 2021-01-04 MED ORDER — MIRABEGRON ER 25 MG PO TB24: ORAL_TABLET | ORAL | 2 refills | Status: DC

## 2021-01-04 NOTE — Telephone Encounter (Signed)
E-scribed refill.  Pt aware.

## 2021-02-01 DIAGNOSIS — M1712 Unilateral primary osteoarthritis, left knee: Secondary | ICD-10-CM | POA: Diagnosis not present

## 2021-02-01 DIAGNOSIS — Z4789 Encounter for other orthopedic aftercare: Secondary | ICD-10-CM | POA: Diagnosis not present

## 2021-02-02 ENCOUNTER — Other Ambulatory Visit: Payer: Self-pay

## 2021-02-02 ENCOUNTER — Encounter: Payer: Self-pay | Admitting: Family Medicine

## 2021-02-02 ENCOUNTER — Ambulatory Visit (INDEPENDENT_AMBULATORY_CARE_PROVIDER_SITE_OTHER): Payer: Medicare Other | Admitting: Family Medicine

## 2021-02-02 VITALS — BP 138/78 | HR 93 | Temp 98.7°F | Ht 68.0 in | Wt 179.5 lb

## 2021-02-02 DIAGNOSIS — B351 Tinea unguium: Secondary | ICD-10-CM

## 2021-02-02 DIAGNOSIS — L84 Corns and callosities: Secondary | ICD-10-CM

## 2021-02-02 DIAGNOSIS — M79674 Pain in right toe(s): Secondary | ICD-10-CM

## 2021-02-02 MED ORDER — CICLOPIROX 8 % EX SOLN: Freq: Every day | CUTANEOUS | 3 refills | Status: DC

## 2021-02-02 NOTE — Progress Notes (Signed)
Jasiah Elsen T. Treyshawn Muldrew, MD, [place] at [facility] [place], [address]  Phone: [phone]  FAX: [phone]  Adrian Davis - 80 y.o. male  MRN [id number]  Date of Birth: 04-Oct-1941  Date: 02/02/2021  PCP: [name], MD  Referral: [name], MD  Chief Complaint  Patient presents with  . Gout    Right Big Toe    This visit occurred during the SARS-CoV-2 public health emergency.  Safety protocols were in place, including screening questions prior to the visit, additional usage of staff PPE, and extensive cleaning of exam room while observing appropriate contact time as indicated for disinfecting solutions.   Subjective:   Adrian Davis is a 80 y.o. very pleasant male patient with Body mass index is 27.29 kg/m. who presents with the following:  He is here for evaluation of right-sided great toe pain.  This is at the distal aspect of the toe and medially.  He has had thickened toenails, and he has been unable to cut them for the last 3 months.  He did go get get a pedicure about 1 week ago.  He has not had any trauma or injury.  He does relate that he has a history of gout, but he has having no redness, warmth, and no joint pain.   He tells me that he has been having some pain in this region for about 2 years, but it has been escalating more of late.  Review of Systems is noted in the HPI, as appropriate  Objective:   BP 138/78   Pulse 93   Temp 98.7 F (37.1 C) (Temporal)   Ht 5\' 8"  (1.727 m)   Wt 179 lb 8 oz (81.4 kg)   SpO2 96%   BMI 27.29 kg/m   GEN: No acute distress; alert,appropriate. PULM: Breathing comfortably in no respiratory distress PSYCH: Normally interactive.   The entirety of the foot and ankle bony anatomy and all joints are nontender to palpation.  The entirety of the hindfoot, ankle, tibia-fibula, midfoot, forefoot and all phalanges and  MTP joints are entirely nontender.  There is no redness or warmth in any of the foot and ankle anatomy bilaterally.  Primary source of tenderness is at the distal first digit with some hardened skin at the end of the toe and more medially.  He is pinpoint tender in this region.  There is no redness, warmth, or evidence of pus or paronychia in this region.  He does have very thickened toenails.  Laboratory and Imaging Data:  Assessment and Plan:     ICD-10-CM   1. Onychomycosis  B35.1   2. Great toe pain, right  M79.674   3. Callus  L84    I reassured him that this is not any sign of infection or gout.  Longstanding callus formation with onychomycosis.  I think at first he needs to get the callus/corn softer and worn down, and then using some Penlac with probably be a good idea.  He and his wife agree with me that avoiding Lamisil would be a better idea.  Patient Instructions  I think that we need to get that really hard piece of skin to soften at the end of the toe.  Get:  Urea Cream 70% with salicyclic acid. This costs 15 dollars on [email or web address]  Use on the thickened are of your toe twice a day for one week.  Then  gently use a pumice stone to work on the corn-like area.    You can do the same thing for any callus or hard spot on the foot - they can really hurt.  Gently put traction on the outside of your toenail, too.    Meds ordered this encounter  Medications  . ciclopirox (PENLAC) 8 % solution    Sig: Apply topically at bedtime. Apply over nail and surrounding skin. Apply daily over previous coat. After seven (7) days, may remove with alcohol and continue cycle.    Dispense:  6.6 mL    Refill:  3   Medications Discontinued During This Encounter  Medication Reason  . HYDROcodone-acetaminophen (NORCO/VICODIN) 5-325 MG tablet Completed Course  . methocarbamol (ROBAXIN) 500 MG tablet Completed Course  . rivaroxaban (XARELTO) 10 MG TABS tablet Completed Course  . traMADol  (ULTRAM) 50 MG tablet Completed Course   No orders of the defined types were placed in this encounter.   Follow-up: No follow-ups on file.  Signed,  Maud Deed. Neriah Brott, MD   Outpatient Encounter Medications as of 02/02/2021  Medication Sig  . acetaminophen (TYLENOL) 650 MG CR tablet Take 650 mg by mouth every 8 (eight) hours as needed for pain.  Marland Kitchen amLODipine (NORVASC) 2.5 MG tablet TAKE 2 TABLETS BY MOUTH EVERY MORNING AND 1 IN THE AFTERNOON AS DIRECTED  . benazepril (LOTENSIN) 20 MG tablet TAKE 1 TABLET(20 MG) BY MOUTH TWICE DAILY  . ciclopirox (PENLAC) 8 % solution Apply topically at bedtime. Apply over nail and surrounding skin. Apply daily over previous coat. After seven (7) days, may remove with alcohol and continue cycle.  . Cyanocobalamin (B-12) 1000 MCG SUBL Place 1 tablet under the tongue every other day.  . diphenhydrAMINE (BENADRYL) 25 MG tablet Take 25 mg by mouth at bedtime.  . docusate sodium (COLACE) 100 MG capsule Take 1 capsule (100 mg total) by mouth 2 (two) times daily.  . famotidine (PEPCID) 20 MG tablet Take 1 tablet (20 mg total) by mouth at bedtime.  . mirabegron ER (MYRBETRIQ) 25 MG TB24 tablet TAKE 1 TABLET(25 MG) BY MOUTH DAILY  . pantoprazole (PROTONIX) 40 MG tablet Take 1 tablet (40 mg total) by mouth 2 (two) times daily.  . polyethylene glycol powder (GLYCOLAX/MIRALAX) 17 GM/SCOOP powder TAKE 17 GRAMS BY MOUTH TWICE DAILY  . potassium chloride (KLOR-CON) 10 MEQ tablet Take 10 mEq by mouth daily.  . sildenafil (REVATIO) 20 MG tablet Take 2 to 5 tablets as needed  . simvastatin (ZOCOR) 10 MG tablet Take 1 tablet (10 mg total) by mouth daily at 6 PM.  . tamsulosin (FLOMAX) 0.4 MG CAPS capsule Take 1 capsule (0.4 mg total) by mouth daily after supper. Pt has stopped on his own.  . [DISCONTINUED] HYDROcodone-acetaminophen (NORCO/VICODIN) 5-325 MG tablet Take 1-2 tablets by mouth every 4 (four) hours as needed for severe pain.  . [DISCONTINUED] methocarbamol  (ROBAXIN) 500 MG tablet Take 1 tablet (500 mg total) by mouth every 6 (six) hours as needed for muscle spasms.  . [DISCONTINUED] traMADol (ULTRAM) 50 MG tablet Take 1-2 tablets (50-100 mg total) by mouth every 6 (six) hours as needed for moderate pain.  . [DISCONTINUED] rivaroxaban (XARELTO) 10 MG TABS tablet Take 1 tablet (10 mg total) by mouth daily with breakfast for 20 days. Then resume one 81 mg Aspirin once a day.   No facility-administered encounter medications on file as of 02/02/2021.

## 2021-02-02 NOTE — Patient Instructions (Signed)
I think that we need to get that really hard piece of skin to soften at the end of the toe.  Get:  Urea Cream 90% with salicyclic acid. This costs 15 dollars on [email or web address]  Use on the thickened are of your toe twice a day for one week.  Then gently use a pumice stone to work on the corn-like area.    You can do the same thing for any callus or hard spot on the foot - they can really hurt.  Gently put traction on the outside of your toenail, too.

## 2021-02-06 ENCOUNTER — Telehealth: Payer: Self-pay | Admitting: Family Medicine

## 2021-02-08 NOTE — Telephone Encounter (Signed)
E-scribed refill.  Plz schedule wellness, lab and cpe visits.  

## 2021-02-09 NOTE — Telephone Encounter (Signed)
Patient refused [place]. I just noted to do paperwork. Patient is scheduled for cpe and labs. EM

## 2021-02-09 NOTE — Telephone Encounter (Signed)
Noted  

## 2021-02-14 ENCOUNTER — Other Ambulatory Visit: Payer: Self-pay | Admitting: Cardiology

## 2021-02-23 ENCOUNTER — Telehealth: Payer: Self-pay | Admitting: Cardiology

## 2021-02-23 NOTE — Telephone Encounter (Signed)
Returned call to pt wife she states that pt had hip surgery in Jan and his Bilt LE are swelling. Wife states that they were so swollen yesterday that they hurt. No c/o CP or SOB. She also states that she still has some HCTZ left over and would like to know if it would be OK to take the HCTZ to help with the swelling? Please advise. Notified that PJ and Malachy Mood are out of the office today and is OK to wait until their return. She will take him to the ER if CP or SOB develops.

## 2021-02-23 NOTE — Telephone Encounter (Signed)
    Pt c/o swelling: STAT is pt has developed SOB within 24 hours  1) How much weight have you gained and in what time span?   2) If swelling, where is the swelling located? Both ankles and feet  3) Are you currently taking a fluid pill? Not as of the moment  4) Are you currently SOB? No  5) Do you have a log of your daily weights (if so, list)? none  6) Have you gained 3 pounds in a day or 5 pounds in a week?not sure  7) Have you traveled recently? No   Pt's wife would like to speak with Malachy Mood, she said she can wait for her tomorrow to call her back, she said pt been having swelling in his feet and ankles, she said per pt pain is getting worst. Pt stopped taking fluid pill after his recent surgery, and wondering if pt can retake it, they are afraid that if he start taking it it will lower his BP

## 2021-02-24 ENCOUNTER — Other Ambulatory Visit: Payer: Self-pay

## 2021-02-24 DIAGNOSIS — I10 Essential (primary) hypertension: Secondary | ICD-10-CM

## 2021-02-24 DIAGNOSIS — E7800 Pure hypercholesterolemia, unspecified: Secondary | ICD-10-CM

## 2021-02-24 MED ORDER — HYDROCHLOROTHIAZIDE 25 MG PO TABS: ORAL_TABLET | ORAL | 1 refills | Status: DC

## 2021-02-24 NOTE — Telephone Encounter (Signed)
Spoke to patient's wife Dr.Jordan's advice given.She stated HCTZ prescription is out of date.Advised I will send in a new prescription.Stated his B/P has been elevated 148/77,150/76.He will monitor B/P.

## 2021-02-24 NOTE — Telephone Encounter (Signed)
It is OK for him to use HCTZ daily until swelling resolved. Just keep an eye on his BP  Lan Mcneill [place] MD, [facility]

## 2021-02-24 NOTE — Progress Notes (Signed)
Spoke to patient's wife Dr.Jordan's advice given.Stated husband's B/P has been elevated today 148/77 last night 150/76.He will take HCTZ 25 mg daily until swelling resolves.He will monitor B/P.Prescription sent to pharmacy.

## 2021-03-02 ENCOUNTER — Telehealth: Payer: Self-pay | Admitting: Family Medicine

## 2021-03-02 NOTE — Telephone Encounter (Signed)
Patients wife is not wanting him to do labwork for his physical because the patient is scheduled to do labwork with cardiologist in July. I spoke with Dr Darnell Level and he agreed not to do it even thought they may order different things. I will be cancelling labwork for this reason. EM

## 2021-03-02 NOTE — Telephone Encounter (Signed)
Noted  

## 2021-03-14 ENCOUNTER — Other Ambulatory Visit: Payer: Self-pay

## 2021-03-14 ENCOUNTER — Ambulatory Visit (INDEPENDENT_AMBULATORY_CARE_PROVIDER_SITE_OTHER): Payer: Medicare Other | Admitting: Family Medicine

## 2021-03-14 ENCOUNTER — Encounter: Payer: Self-pay | Admitting: Family Medicine

## 2021-03-14 VITALS — BP 120/72 | HR 86 | Temp 97.7°F | Ht 68.0 in | Wt 177.6 lb

## 2021-03-14 DIAGNOSIS — K5909 Other constipation: Secondary | ICD-10-CM

## 2021-03-14 DIAGNOSIS — E538 Deficiency of other specified B group vitamins: Secondary | ICD-10-CM | POA: Diagnosis not present

## 2021-03-14 DIAGNOSIS — Z96641 Presence of right artificial hip joint: Secondary | ICD-10-CM

## 2021-03-14 DIAGNOSIS — D500 Iron deficiency anemia secondary to blood loss (chronic): Secondary | ICD-10-CM | POA: Diagnosis not present

## 2021-03-14 DIAGNOSIS — R5383 Other fatigue: Secondary | ICD-10-CM | POA: Diagnosis not present

## 2021-03-14 LAB — COMPREHENSIVE METABOLIC PANEL
ALT: 9 U/L (ref 0–53)
AST: 10 U/L (ref 0–37)
Albumin: 4.2 g/dL (ref 3.5–5.2)
Alkaline Phosphatase: 63 U/L (ref 39–117)
BUN: 22 mg/dL (ref 6–23)
CO2: 30 mEq/L (ref 19–32)
Calcium: 9.5 mg/dL (ref 8.4–10.5)
Chloride: 103 mEq/L (ref 96–112)
Creatinine, Ser: 0.96 mg/dL (ref 0.40–1.50)
GFR: 75.55 mL/min (ref >60.00)
Glucose, Bld: 115 mg/dL — ABNORMAL HIGH (ref 70–99)
Potassium: 3.9 mEq/L (ref 3.5–5.1)
Sodium: 140 mEq/L (ref 135–145)
Total Bilirubin: 0.3 mg/dL (ref 0.2–1.2)
Total Protein: 6.4 g/dL (ref 6.0–8.3)

## 2021-03-14 LAB — FERRITIN: Ferritin: 32.2 ng/mL (ref 22.0–322.0)

## 2021-03-14 LAB — CBC WITH DIFFERENTIAL/PLATELET
Basophils Absolute: 0 10*3/uL (ref 0.0–0.1)
Basophils Relative: 0.3 % (ref 0.0–3.0)
Eosinophils Absolute: 0 10*3/uL (ref 0.0–0.7)
Eosinophils Relative: 0.5 % (ref 0.0–5.0)
HCT: 41.3 % (ref 39.0–52.0)
Hemoglobin: 13.8 g/dL (ref 13.0–17.0)
Lymphocytes Relative: 21.9 % (ref 12.0–46.0)
Lymphs Abs: 1.7 10*3/uL (ref 0.7–4.0)
MCHC: 33.3 g/dL (ref 30.0–36.0)
MCV: 87.6 fl (ref 78.0–100.0)
Monocytes Absolute: 0.6 10*3/uL (ref 0.1–1.0)
Monocytes Relative: 8.2 % (ref 3.0–12.0)
Neutro Abs: 5.2 10*3/uL (ref 1.4–7.7)
Neutrophils Relative %: 69.1 % (ref 43.0–77.0)
Platelets: 234 10*3/uL (ref 150.0–400.0)
RBC: 4.71 Mil/uL (ref 4.22–5.81)
RDW: 15.1 % (ref 11.5–15.5)
WBC: 7.6 10*3/uL (ref 4.0–10.5)

## 2021-03-14 LAB — IBC PANEL
Iron: 50 ug/dL (ref 42–165)
Saturation Ratios: 14.9 % — ABNORMAL LOW (ref 20.0–50.0)
Transferrin: 239 mg/dL (ref 212.0–360.0)

## 2021-03-14 LAB — VITAMIN B12: Vitamin B-12: 619 pg/mL (ref 211–911)

## 2021-03-14 LAB — TSH: TSH: 1.86 u[IU]/mL (ref 0.35–4.50)

## 2021-03-14 NOTE — Patient Instructions (Addendum)
Labs today I suspect possible ongoing anemia.  Continue boost supplement.  Look at iron rich diet handout.

## 2021-03-14 NOTE — Progress Notes (Signed)
Patient ID: Adrian Davis, male    DOB: [birth year], 80 y.o.   MRN: [id number]  This visit was conducted in person.  BP 120/72   Pulse 86   Temp 97.7 F (36.5 C) (Temporal)   Ht 5\' 8"  (1.727 m)   Wt 177 lb 9 oz (80.5 kg)   SpO2 97%   BMI 27.00 kg/m    CC: fatigue  Subjective:   HPI: Adrian Davis is a 80 y.o. male presenting on 03/14/2021 for Fatigue (C/o feeling tired.  Started about 4 wks.  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  Had a slow bleed afterwards.  Wants to discuss potassium. Pt accompanied by wife, Adrian Davis- temp 97.7.)   6 wk h/o fatigue, actually started after R hip replacement 10/2020 ([facility]). He had hematoma after surgery. 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 He started boost supplements last week with improvement in symptoms.   Denies dyspnea, dizziness with standing, chest pain, abd pain, nausea/vomiting.   Ongoing alternating diarrhea and constipation - manages with colace and miralax.  Continues vitamin B12 1043mcg QOD.  Not taking iron supplement. Not great with iron in diet - avoids red meats due to dental issues.   Recently started on hctz 25mg  daily by cards for pedal edema - also takes his regular potassium 29mEq daily. Wonders if potassium is low.      Relevant past medical, surgical, family and social history reviewed and updated as indicated. Interim medical history since our last visit reviewed. Allergies and medications reviewed and updated. Outpatient Medications Prior to Visit  Medication Sig Dispense Refill  . acetaminophen (TYLENOL) 650 MG CR tablet Take 650 mg by mouth every 8 (eight) hours as needed for pain.    Marland Kitchen amLODipine (NORVASC) 2.5 MG tablet TAKE 2 TABLETS BY MOUTH EVERY MORNING AND 1 IN THE AFTERNOON AS DIRECTED 270 tablet 3  . benazepril (LOTENSIN) 20 MG tablet TAKE 1 TABLET(20 MG) BY MOUTH TWICE DAILY 180 tablet 2  . ciclopirox (PENLAC) 8 % solution Apply topically at bedtime. Apply over  nail and surrounding skin. Apply daily over previous coat. After seven (7) days, may remove with alcohol and continue cycle. 6.6 mL 3  . Cyanocobalamin (B-12) 1000 MCG SUBL Place 1 tablet under the tongue every other day. 30 each   . diphenhydrAMINE (BENADRYL) 25 MG tablet Take 25 mg by mouth at bedtime.    . docusate sodium (COLACE) 100 MG capsule Take 1 capsule (100 mg total) by mouth 2 (two) times daily. 180 capsule 3  . famotidine (PEPCID) 20 MG tablet TAKE 1 TABLET(20 MG) BY MOUTH TWICE DAILY 180 tablet 0  . hydrochlorothiazide (HYDRODIURIL) 25 MG tablet Take 25 mg daily if needed for swelling 90 tablet 1  . mirabegron ER (MYRBETRIQ) 25 MG TB24 tablet TAKE 1 TABLET(25 MG) BY MOUTH DAILY 30 tablet 2  . pantoprazole (PROTONIX) 40 MG tablet Take 1 tablet (40 mg total) by mouth 2 (two) times daily. 180 tablet 2  . polyethylene glycol powder (GLYCOLAX/MIRALAX) 17 GM/SCOOP powder TAKE 17 GRAMS BY MOUTH TWICE DAILY 510 g 2  . potassium chloride (KLOR-CON) 10 MEQ tablet Take 10 mEq by mouth daily.    . sildenafil (REVATIO) 20 MG tablet Take 2 to 5 tablets as needed 50 tablet 3  . simvastatin (ZOCOR) 10 MG tablet TAKE 1 TABLET BY MOUTH ONCE A DAY AT SIXIN THE EVENING 90 tablet 3  . tamsulosin (FLOMAX) 0.4 MG CAPS capsule  TAKE 1 CAPSULE BY MOUTH ONCE DAILY AFTERSUPPER 90 capsule 3   No facility-administered medications prior to visit.     Per HPI unless specifically indicated in ROS section below Review of Systems Objective:  BP 120/72   Pulse 86   Temp 97.7 F (36.5 C) (Temporal)   Ht 5\' 8"  (1.727 m)   Wt 177 lb 9 oz (80.5 kg)   SpO2 97%   BMI 27.00 kg/m   Wt Readings from Last 3 Encounters:  03/14/21 177 lb 9 oz (80.5 kg)  02/02/21 179 lb 8 oz (81.4 kg)  11/19/20 178 lb (80.7 kg)      Physical Exam Vitals and nursing note reviewed.  Constitutional:      Appearance: Normal appearance. He is not ill-appearing.  HENT:     Head: Normocephalic and atraumatic.  Eyes:     Extraocular  Movements: Extraocular movements intact.     Conjunctiva/sclera: Conjunctivae normal.     Pupils: Pupils are equal, round, and reactive to light.     Comments: No conjunctival pallor  Cardiovascular:     Rate and Rhythm: Normal rate and regular rhythm.     Pulses: Normal pulses.     Heart sounds: Normal heart sounds. No murmur heard.   Pulmonary:     Effort: Pulmonary effort is normal. No respiratory distress.     Breath sounds: Normal breath sounds. No wheezing, rhonchi or rales.  Musculoskeletal:     Cervical back: Normal range of motion and neck supple.     Right lower leg: No edema.     Left lower leg: No edema.  Lymphadenopathy:     Cervical: No cervical adenopathy.  Skin:    General: Skin is warm and dry.     Coloration: Skin is not pale.     Findings: No rash.  Neurological:     Mental Status: He is alert.  Psychiatric:        Mood and Affect: Mood normal.        Behavior: Behavior normal.       Results for orders placed or performed in visit on 03/14/21  Comprehensive metabolic panel  Result Value Ref Range   Sodium 140 135 - 145 mEq/L   Potassium 3.9 3.5 - 5.1 mEq/L   Chloride 103 96 - 112 mEq/L   CO2 30 19 - 32 mEq/L   Glucose, Bld 115 (H) 70 - 99 mg/dL   BUN 22 6 - 23 mg/dL   Creatinine, Ser 0.96 0.40 - 1.50 mg/dL   Total Bilirubin 0.3 0.2 - 1.2 mg/dL   Alkaline Phosphatase 63 39 - 117 U/L   AST 10 0 - 37 U/L   ALT 9 0 - 53 U/L   Total Protein 6.4 6.0 - 8.3 g/dL   Albumin 4.2 3.5 - 5.2 g/dL   GFR 75.55 >60.00 mL/min   Calcium 9.5 8.4 - 10.5 mg/dL  TSH  Result Value Ref Range   TSH 1.86 0.35 - 4.50 uIU/mL  CBC with Differential/Platelet  Result Value Ref Range   WBC 7.6 4.0 - 10.5 K/uL   RBC 4.71 4.22 - 5.81 Mil/uL   Hemoglobin 13.8 13.0 - 17.0 g/dL   HCT 41.3 39.0 - 52.0 %   MCV 87.6 78.0 - 100.0 fl   MCHC 33.3 30.0 - 36.0 g/dL   RDW 15.1 11.5 - 15.5 %   Platelets 234.0 150.0 - 400.0 K/uL   Neutrophils Relative % 69.1 43.0 - 77.0 %    Lymphocytes  Relative 21.9 12.0 - 46.0 %   Monocytes Relative 8.2 3.0 - 12.0 %   Eosinophils Relative 0.5 0.0 - 5.0 %   Basophils Relative 0.3 0.0 - 3.0 %   Neutro Abs 5.2 1.4 - 7.7 K/uL   Lymphs Abs 1.7 0.7 - 4.0 K/uL   Monocytes Absolute 0.6 0.1 - 1.0 K/uL   Eosinophils Absolute 0.0 0.0 - 0.7 K/uL   Basophils Absolute 0.0 0.0 - 0.1 K/uL  Vitamin B12  Result Value Ref Range   Vitamin B-12 619 211 - 911 pg/mL  Ferritin  Result Value Ref Range   Ferritin 32.2 22.0 - 322.0 ng/mL  IBC panel  Result Value Ref Range   Iron 50 42 - 165 ug/dL   Transferrin 239.0 212.0 - 360.0 mg/dL   Saturation Ratios 14.9 (L) 20.0 - 50.0 %   Assessment & Plan:  This visit occurred during the SARS-CoV-2 public health emergency.  Safety protocols were in place, including screening questions prior to the visit, additional usage of staff PPE, and extensive cleaning of exam room while observing appropriate contact time as indicated for disinfecting solutions.   Problem List Items Addressed This Visit    Vitamin B12 deficiency    Continues 106mcg QOD - update B12 levels.      Relevant Orders   Vitamin B12 (Completed)   Chronic constipation    Continues miralax and colace regularly.  Consider CIC medication like linzess or amitiza.      Blood loss anemia    H/o this after surgery - update CBC as well as iron levels.      Relevant Orders   CBC with Differential/Platelet (Completed)   Ferritin (Completed)   IBC panel (Completed)   Fatigue - Primary    Fatigue since hip replacement surgery 10/2020, acutely worse over the past 6 wks. Check labs for reversible causes of fatigue. Suspect component of iron deficiency. Caution with oral replacement given he is prone to constipation.       Relevant Orders   Comprehensive metabolic panel (Completed)   TSH (Completed)   CBC with Differential/Platelet (Completed)   Vitamin B12 (Completed)   Ferritin (Completed)   IBC panel (Completed)   Status post right  hip replacement    Overall recovered well.           No orders of the defined types were placed in this encounter.  Orders Placed This Encounter  Procedures  . Comprehensive metabolic panel  . TSH  . CBC with Differential/Platelet  . Vitamin B12  . Ferritin  . IBC panel    Patient Instructions  Labs today I suspect possible ongoing anemia.  Continue boost supplement.  Look at iron rich diet handout.    Follow up plan: Return if symptoms worsen or fail to improve.  [name], MD

## 2021-03-16 DIAGNOSIS — Z96641 Presence of right artificial hip joint: Secondary | ICD-10-CM | POA: Insufficient documentation

## 2021-03-16 NOTE — Assessment & Plan Note (Signed)
Continues 1052mcg QOD - update B12 levels.

## 2021-03-16 NOTE — Assessment & Plan Note (Signed)
Overall recovered well.

## 2021-03-16 NOTE — Assessment & Plan Note (Addendum)
Continues miralax and colace regularly.  Consider CIC medication like linzess or amitiza.

## 2021-03-16 NOTE — Assessment & Plan Note (Signed)
Fatigue since hip replacement surgery 10/2020, acutely worse over the past 6 wks. Check labs for reversible causes of fatigue. Suspect component of iron deficiency. Caution with oral replacement given he is prone to constipation.

## 2021-03-16 NOTE — Assessment & Plan Note (Signed)
H/o this after surgery - update CBC as well as iron levels.

## 2021-03-17 ENCOUNTER — Telehealth: Payer: Self-pay

## 2021-03-17 NOTE — Telephone Encounter (Signed)
Patient's wife called asking if patient needs to keep his CPE appointment on 04/19/21 since patient came and saw Dr Darnell Level on 03/14/21. I did advise that it was a visit for different issues but will check.

## 2021-03-17 NOTE — Progress Notes (Signed)
Patient's wife, Mr Kiger, called to check on lab results. Advised that results were mailed to the patient and also advised over the phone.  They will make the decision on the boost and iron supplements but not sure as of today.

## 2021-03-17 NOTE — Telephone Encounter (Signed)
Do recommend he keeps CPE appt.

## 2021-03-18 NOTE — Telephone Encounter (Signed)
Spoke with pt relaying Dr. G's message. Pt verbalizes understanding.  

## 2021-03-28 NOTE — Progress Notes (Signed)
Adrian Davis Date of Birth: 1941/08/21 Medical Record #[id number]  History of Present Illness: Adrian Davis is seen today for follow up of HTN. He has a history of HTN and HLD. No known CAD. He had a normal Adenosine Myoview back in 2008. His last echo was in January of 2012 showing grade 1 diastolic dysfunction, mild LVH and a normal EF.    He  underwent right THR on 11/10/20 by [name]. He noted persistent fatigue post surgery. Recent labs by primary care were OK except iron borderline.  BP at home has been well controlled.   On follow up today he states he is doing well. 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 Denies any chest pain, SOB, palpitations, dizziness. Weight has improved. He does have some dependent edema at the end of the day but goes down at night. Takes HCTZ as needed.   Current Outpatient Medications on File Prior to Visit  Medication Sig Dispense Refill   acetaminophen (TYLENOL) 650 MG CR tablet Take 650 mg by mouth every 8 (eight) hours as needed for pain.     amLODipine (NORVASC) 2.5 MG tablet TAKE 2 TABLETS BY MOUTH EVERY MORNING AND 1 IN THE AFTERNOON AS DIRECTED 270 tablet 3   benazepril (LOTENSIN) 20 MG tablet TAKE 1 TABLET(20 MG) BY MOUTH TWICE DAILY 180 tablet 2   ciclopirox (PENLAC) 8 % solution Apply topically at bedtime. Apply over nail and surrounding skin. Apply daily over previous coat. After seven (7) days, may remove with alcohol and continue cycle. 6.6 mL 3   Cyanocobalamin (B-12) 1000 MCG SUBL Place 1 tablet under the tongue every other day. 30 each    diphenhydrAMINE (BENADRYL) 25 MG tablet Take 25 mg by mouth at bedtime.     docusate sodium (COLACE) 100 MG capsule Take 1 capsule (100 mg total) by mouth 2 (two) times daily. 180 capsule 3   famotidine (PEPCID) 20 MG tablet TAKE 1 TABLET(20 MG) BY MOUTH TWICE DAILY 180 tablet 0   hydrochlorothiazide (HYDRODIURIL) 25 MG tablet Take 25 mg daily if needed for swelling 90 tablet 1   MYRBETRIQ 25 MG  TB24 tablet TAKE 1 TABLET BY MOUTH ONCE A DAY 30 tablet 0   pantoprazole (PROTONIX) 40 MG tablet Take 1 tablet (40 mg total) by mouth 2 (two) times daily. 180 tablet 2   polyethylene glycol powder (GLYCOLAX/MIRALAX) 17 GM/SCOOP powder TAKE 17 GRAMS BY MOUTH TWICE DAILY 510 g 2   potassium chloride (KLOR-CON) 10 MEQ tablet Take 10 mEq by mouth daily.     potassium chloride (KLOR-CON) 10 MEQ tablet TAKE 1 TABLET BY MOUTH ONCE A DAY 90 tablet 3   sildenafil (REVATIO) 20 MG tablet Take 2 to 5 tablets as needed 50 tablet 3   simvastatin (ZOCOR) 10 MG tablet TAKE 1 TABLET BY MOUTH ONCE A DAY AT SIXIN THE EVENING 90 tablet 3   tamsulosin (FLOMAX) 0.4 MG CAPS capsule TAKE 1 CAPSULE BY MOUTH ONCE DAILY AFTERSUPPER 90 capsule 3   No current facility-administered medications on file prior to visit.    No Known Allergies  Past Medical History:  Diagnosis Date   Abnormal echocardiogram Jan 2012   grade 1 diastolic dysfunction, normal EF, mild LVH   Anemia    Basal cell carcinoma 04/08/2019   left nose inferior to nasal alar groove. EDC: 05/20/2019   Bell palsy 10/2010   presented with facial numbness   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  Degenerative disk disease    GERD (gastroesophageal reflux disease)    History of kidney stones    Hyperlipidemia    Hypertension    dr Layal Javid [place]   Kidney cysts    Lumbar stenosis    Nephrolithiasis    Normal nuclear stress test 2008   Osteoarthritis    Skin cancer    Vocal cord leukoplakia    Dr Erik Obey    Past Surgical History:  Procedure Laterality Date   COLONOSCOPY  04/2019   TAx2, hemorrhoids, rpt 78yrs ([place])   ESOPHAGOGASTRODUODENOSCOPY  04/2019   erosive gastropathy, biopsy negative for barrett's ([place])   KNEE ARTHROSCOPY Right    LUMBAR LAMINECTOMY/DECOMPRESSION MICRODISCECTOMY N/A 11/12/2013   Ophelia Charter, MD - complicated by urinary retention   SHOULDER SURGERY Bilateral    TONSILLECTOMY      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   Procedure: TOTAL HIP ARTHROPLASTY ANTERIOR APPROACH;  Surgeon: [name], MD;  Location: [facility];  Service: Orthopedics;  Laterality: Right;  135min    Social History   Tobacco Use  Smoking Status Former   Packs/day: 1.50   Years: 10.00   Pack years: 15.00   Types: Cigarettes   Quit date: 05/15/1976   Years since quitting: 44.9  Smokeless Tobacco Current   Types: Chew  Tobacco Comments   chews tobacco    Social History   Substance and Sexual Activity  Alcohol Use Yes   Comment: rare    Family History  Problem Relation Age of Onset   Heart failure Father 37   Hypertension Father    Arthritis Father    Anemia Mother        needed regular transfusions   Prostate cancer Son    Cancer Neg Hx    Diabetes Neg Hx    Colon cancer Neg Hx    Rectal cancer Neg Hx    Stomach cancer Neg Hx    Inflammatory bowel disease Neg Hx    Liver disease Neg Hx    Pancreatic cancer Neg Hx     Review of Systems: The review of systems is per the HPI.  All other systems were reviewed and are negative.  Physical Exam: BP (!) 142/70 (BP Location: Left Arm, Patient Position: Sitting, Cuff Size: Normal)   Pulse 69   Ht 5' 8.5" (1.74 m)   Wt 177 lb (80.3 kg)   BMI 26.52 kg/m  GENERAL:  Well appearing WM in NAD HEENT:  PERRL, EOMI, sclera are clear. Oropharynx is clear. NECK:  No jugular venous distention, carotid upstroke brisk and symmetric, no bruits, no thyromegaly or adenopathy LUNGS:  Clear to auscultation bilaterally CHEST:  Unremarkable HEART:  RRR,  PMI not displaced or sustained,S1 and S2 within normal limits, no S3, no S4: no clicks, no rubs, no murmurs ABD:  Soft, nontender. BS +, no masses or bruits. No hepatomegaly, no splenomegaly EXT:  2 + pulses throughout, no edema, no cyanosis no clubbing SKIN:  Warm and dry.  No rashes NEURO:  Alert and oriented x 3. Cranial nerves II through XII intact. PSYCH:  Cognitively  intact      Laboratory data: Lab Results  Component Value Date   WBC 7.6 03/14/2021   HGB 13.8 03/14/2021   HCT 41.3 03/14/2021   PLT 234.0 03/14/2021   GLUCOSE 99 04/07/2021   CHOL 143 04/07/2021   TRIG 195 (H) 04/07/2021   HDL 37 (L) 04/07/2021   LDLDIRECT 102.0 05/15/2011   LDLCALC 73 04/07/2021  ALT 9 04/07/2021   AST 9 04/07/2021   NA 139 04/07/2021   K 3.8 04/07/2021   CL 104 04/07/2021   CREATININE 1.03 04/07/2021   BUN 25 04/07/2021   CO2 22 04/07/2021   TSH 1.86 03/14/2021   PSA 1.50 04/01/2019   INR 1.0 11/02/2020   HGBA1C 5.8 11/02/2020   Ecg today shows NSR with nonspecific ST-T wave abnormality. Possible LVH. I have personally reviewed and interpreted this study.  Assessment / Plan: 1. HTN well controlled.  Continue amlodipine and lotensin. Only uses HCTZ prn for swelling.  2. Hyperlipidemia. Labs look good. Continue current therapy    Follow up in 6 months.

## 2021-04-01 ENCOUNTER — Other Ambulatory Visit: Payer: Self-pay | Admitting: Cardiology

## 2021-04-01 ENCOUNTER — Other Ambulatory Visit: Payer: Self-pay | Admitting: Family Medicine

## 2021-04-07 ENCOUNTER — Other Ambulatory Visit: Payer: Self-pay | Admitting: Cardiology

## 2021-04-07 DIAGNOSIS — E7800 Pure hypercholesterolemia, unspecified: Secondary | ICD-10-CM | POA: Diagnosis not present

## 2021-04-07 DIAGNOSIS — I10 Essential (primary) hypertension: Secondary | ICD-10-CM | POA: Diagnosis not present

## 2021-04-07 LAB — BASIC METABOLIC PANEL
BUN/Creatinine Ratio: 24 (ref 10–24)
BUN: 25 mg/dL (ref 8–27)
CO2: 22 mmol/L (ref 20–29)
Calcium: 9.8 mg/dL (ref 8.6–10.2)
Chloride: 104 mmol/L (ref 96–106)
Creatinine, Ser: 1.03 mg/dL (ref 0.76–1.27)
Glucose: 99 mg/dL (ref 65–99)
Potassium: 3.8 mmol/L (ref 3.5–5.2)
Sodium: 139 mmol/L (ref 134–144)
eGFR: 74 mL/min/{1.73_m2} (ref >59)

## 2021-04-07 LAB — LIPID PANEL
Chol/HDL Ratio: 3.9 ratio (ref 0.0–5.0)
Cholesterol, Total: 143 mg/dL (ref 100–199)
HDL: 37 mg/dL — ABNORMAL LOW (ref >39)
LDL Chol Calc (NIH): 73 mg/dL (ref 0–99)
Triglycerides: 195 mg/dL — ABNORMAL HIGH (ref 0–149)
VLDL Cholesterol Cal: 33 mg/dL (ref 5–40)

## 2021-04-07 LAB — HEPATIC FUNCTION PANEL
ALT: 9 IU/L (ref 0–44)
AST: 9 IU/L (ref 0–40)
Albumin: 4.1 g/dL (ref 3.7–4.7)
Alkaline Phosphatase: 71 IU/L (ref 44–121)
Bilirubin Total: 0.3 mg/dL (ref 0.0–1.2)
Bilirubin, Direct: <0.1 mg/dL (ref 0.00–0.40)
Total Protein: 6.4 g/dL (ref 6.0–8.5)

## 2021-04-11 ENCOUNTER — Ambulatory Visit (INDEPENDENT_AMBULATORY_CARE_PROVIDER_SITE_OTHER): Payer: Medicare Other | Admitting: Cardiology

## 2021-04-11 ENCOUNTER — Encounter: Payer: Self-pay | Admitting: Cardiology

## 2021-04-11 ENCOUNTER — Other Ambulatory Visit: Payer: Self-pay

## 2021-04-11 VITALS — BP 142/70 | HR 69 | Ht 68.5 in | Wt 177.0 lb

## 2021-04-11 DIAGNOSIS — I10 Essential (primary) hypertension: Secondary | ICD-10-CM | POA: Diagnosis not present

## 2021-04-11 DIAGNOSIS — E7800 Pure hypercholesterolemia, unspecified: Secondary | ICD-10-CM | POA: Diagnosis not present

## 2021-04-12 ENCOUNTER — Other Ambulatory Visit: Payer: Medicare Other

## 2021-04-19 ENCOUNTER — Encounter: Payer: Medicare Other | Admitting: Family Medicine

## 2021-04-27 ENCOUNTER — Other Ambulatory Visit: Payer: Self-pay | Admitting: Family Medicine

## 2021-05-02 ENCOUNTER — Other Ambulatory Visit: Payer: Self-pay | Admitting: Cardiology

## 2021-05-04 ENCOUNTER — Ambulatory Visit: Payer: Medicare Other | Admitting: Physician Assistant

## 2021-05-04 DIAGNOSIS — M25562 Pain in left knee: Secondary | ICD-10-CM | POA: Diagnosis not present

## 2021-05-11 ENCOUNTER — Encounter: Payer: Medicare Other | Admitting: Family Medicine

## 2021-05-18 ENCOUNTER — Ambulatory Visit: Payer: Medicare Other | Admitting: Cardiology

## 2021-06-13 ENCOUNTER — Telehealth: Payer: Self-pay | Admitting: Family Medicine

## 2021-06-13 NOTE — Chronic Care Management (AMB) (Signed)
  Chronic Care Management   Note  06/13/2021 Name: Adrian Davis MRN: [id number] DOB: 1941/08/05  Adrian Davis is a 80 y.o. year old male who is a primary care patient of [name], MD. I reached out to Adrian Davis by phone today in response to a referral sent by Mr. Jerilynn Birkenhead PCP, [name], MD.   Mr. Wolter was given information about Chronic Care Management services today including:  CCM service includes personalized support from designated clinical staff supervised by his physician, including individualized plan of care and coordination with other care providers 24/7 contact phone numbers for assistance for urgent and routine care needs. Service will only be billed when office clinical staff spend 20 minutes or more in a month to coordinate care. Only one practitioner may furnish and bill the service in a calendar month. The patient may stop CCM services at any time (effective at the end of the month) by phone call to the office staff.   Patient agreed to services and verbal consent obtained.   Follow up plan:    Tatjana Secretary/administrator

## 2021-07-25 ENCOUNTER — Other Ambulatory Visit: Payer: Self-pay | Admitting: Family Medicine

## 2021-07-26 ENCOUNTER — Other Ambulatory Visit: Payer: Self-pay | Admitting: Cardiology

## 2021-07-29 ENCOUNTER — Telehealth: Payer: Self-pay

## 2021-07-29 NOTE — Chronic Care Management (AMB) (Signed)
Chronic Care Management Pharmacy Assistant   Name: Adrian Davis  MRN: [id number] DOB: 1941/10/06  Adrian Davis is an 80 y.o. year old male who presents for his initial CCM visit with the clinical pharmacist.  Reason for Encounter: Initial Questions  Conditions to be addressed/Monitored:  HTN,HLD  Recent office visits:  03/14/21-PCP-Patient presented for fatigue ongoing 4 weeks.Labs ordered(overall ok - kidneys, liver, thyroid, vitamin B12 levels and blood counts normal) Iron levels only slightly low. Continue boost supplement. Look at iron rich diet handout. We could also consider adding oral iron supplement MWF, watching for worsening constipation. If desires to try this, may pick up OTC ferrous sulfate 325mg  (65 FE).  02/02/21-Family Medicine- Patient presented for toe pain.Get:  Urea Cream 73% with salicyclic acid.Use on toe 2 times daily for 1 week,use pumice stone on corn.ciclopirox (PENLAC) 8 % solution  Recent consult visits:  04/11/21-Cardiology-Patient presented for follow up hypertension and HLD.No medication changes follow up 6 months 02/01/21-Orthopedic surgery- no data found  Hospital visits:  None in previous 6 months  Medications: Outpatient Encounter Medications as of 07/29/2021  Medication Sig Note   amLODipine (NORVASC) 2.5 MG tablet TAKE 2 TABLETS BY MOUTH EVERY MORNING AND 1 TABLET IN THE AFTERNOON AS DIRECTED    acetaminophen (TYLENOL) 650 MG CR tablet Take 650 mg by mouth every 8 (eight) hours as needed for pain.    benazepril (LOTENSIN) 20 MG tablet TAKE 1 TABLET(20 MG) BY MOUTH TWICE DAILY    ciclopirox (PENLAC) 8 % solution Apply topically at bedtime. Apply over nail and surrounding skin. Apply daily over previous coat. After seven (7) days, may remove with alcohol and continue cycle.    Cyanocobalamin (B-12) 1000 MCG SUBL Place 1 tablet under the tongue every other day.    diphenhydrAMINE (BENADRYL) 25 MG tablet Take 25 mg by mouth at bedtime.    docusate  sodium (COLACE) 100 MG capsule Take 1 capsule (100 mg total) by mouth 2 (two) times daily.    famotidine (PEPCID) 20 MG tablet TAKE 1 TABLET(20 MG) BY MOUTH TWICE DAILY    hydrochlorothiazide (HYDRODIURIL) 25 MG tablet TAKE 1 TABLET BY MOUTH ONCE A DAY AS NEEDED FOR SWELLING    MYRBETRIQ 25 MG TB24 tablet TAKE 1 TABLET BY MOUTH ONCE A DAY    pantoprazole (PROTONIX) 40 MG tablet Take 1 tablet (40 mg total) by mouth 2 (two) times daily.    polyethylene glycol powder (GLYCOLAX/MIRALAX) 17 GM/SCOOP powder TAKE 17 GRAMS BY MOUTH TWICE DAILY    potassium chloride (KLOR-CON) 10 MEQ tablet Take 10 mEq by mouth daily.    potassium chloride (KLOR-CON) 10 MEQ tablet TAKE 1 TABLET BY MOUTH ONCE A DAY    sildenafil (REVATIO) 20 MG tablet Take 2 to 5 tablets as needed 10/29/2020: ON HOLD    simvastatin (ZOCOR) 10 MG tablet TAKE 1 TABLET BY MOUTH ONCE A DAY AT SIXIN THE EVENING    tamsulosin (FLOMAX) 0.4 MG CAPS capsule TAKE 1 CAPSULE BY MOUTH ONCE DAILY AFTERSUPPER    No facility-administered encounter medications on file as of 07/29/2021.     Lab Results  Component Value Date/Time   HGBA1C 5.8 11/02/2020 09:57 AM   HGBA1C (H) 11/12/2010 08:28 PM    5.7 (NOTE)  According to the ADA Clinical Practice Recommendations for 2011, when HbA1c is used as a screening test:   >=6.5%   Diagnostic of Diabetes Mellitus           (if abnormal result  is confirmed)  5.7-6.4%   Increased risk of developing Diabetes Mellitus  References:Diagnosis and Classification of Diabetes Mellitus,Diabetes UQJF,3545,62(BWLSL 1):S62-S69 and Standards of Medical Care in         Diabetes - 2011,Diabetes HTDS,2876,81  (Suppl 1):S11-S61.     BP Readings from Last 3 Encounters:  04/11/21 (!) 142/70  03/14/21 120/72  02/02/21 138/78    Patient contacted to review initial questions prior to visit with [facility].  Have you seen any other providers since  your last visit with PCP? Yes  04/11/21-Cardiology follow up,no medication changes.  Any changes in your medications or health? Yes The patient reports he is having leg cramps and some swelling of the lower extremities from the knees down. The patient reports he has a follow up scheduled with his orthopedic surgeon to discuss since he has knee issues and hip issues.  Any side effects from any medications? No  Do you have an symptoms or problems not managed by your medications? No  Any concerns about your health right now? Yes the patient is concerned about his leg cramps and swelling   Has your provider asked that you check blood pressure, blood sugar, or follow special diet at home? Yes He checks his BP daily  146/79,127/76  Do you get any type of exercise on a regular basis? Yes He has a farm and works every day   Can you think of a goal you would like to reach for your health? No  Do you have any problems getting your medications? No Adrian Davis is good to the patient.   Is there anything that you would like to discuss during the appointment? No   Adrian Davis was reminded to have all medications, supplements and any blood glucose and blood pressure readings available for review with [name], Pharm. D, at his telephone visit on 08/05/21 at 9:00am.    Star Rating Drugs:  Medication:   Last Fill: Day Supply Simvastatin 10mg   07/26/21 90 Benazepril HCL 20 mg 07/26/21 90  Care Gaps: Annual wellness visit in last year? No  2018 Most Recent BP reading: 142/70  69-P   04/11/21  [name], [place]  notified  [name], [place] Assistant [phone]  Total time spent for month CPA: 40 min.

## 2021-08-04 DIAGNOSIS — M25562 Pain in left knee: Secondary | ICD-10-CM | POA: Diagnosis not present

## 2021-08-04 DIAGNOSIS — M1712 Unilateral primary osteoarthritis, left knee: Secondary | ICD-10-CM | POA: Diagnosis not present

## 2021-08-05 ENCOUNTER — Other Ambulatory Visit: Payer: Self-pay

## 2021-08-05 ENCOUNTER — Ambulatory Visit (INDEPENDENT_AMBULATORY_CARE_PROVIDER_SITE_OTHER): Payer: Medicare Other | Admitting: Pharmacist

## 2021-08-05 DIAGNOSIS — K219 Gastro-esophageal reflux disease without esophagitis: Secondary | ICD-10-CM

## 2021-08-05 DIAGNOSIS — M159 Polyosteoarthritis, unspecified: Secondary | ICD-10-CM | POA: Diagnosis not present

## 2021-08-05 DIAGNOSIS — N401 Enlarged prostate with lower urinary tract symptoms: Secondary | ICD-10-CM | POA: Diagnosis not present

## 2021-08-05 DIAGNOSIS — N3281 Overactive bladder: Secondary | ICD-10-CM

## 2021-08-05 DIAGNOSIS — I10 Essential (primary) hypertension: Secondary | ICD-10-CM | POA: Diagnosis not present

## 2021-08-05 DIAGNOSIS — E785 Hyperlipidemia, unspecified: Secondary | ICD-10-CM

## 2021-08-05 DIAGNOSIS — R3915 Urgency of urination: Secondary | ICD-10-CM

## 2021-08-05 DIAGNOSIS — K5909 Other constipation: Secondary | ICD-10-CM

## 2021-08-05 NOTE — Progress Notes (Signed)
Chronic Care Management Pharmacy Note  08/05/2021 Name:  Adrian Davis MRN:  [id number] DOB:  1941-10-22  Summary: -Pt reports swelling improved after reducing amlodipine to 2.5 mg BID and taking HCTZ 25 mg every day -Pt is taking Benadryl daily for allergic rhinitis, which may contributing to constipation  -Pt is taking OTC Aleve 2 tablets daily; it is the only thing that helps with his joint/back pain; counseled on chronic NSAID risks -Pt is not sure if Myrbetriq is helping urinary frequency  Recommendations/Changes made from today's visit: -Advised to stop Benadryl and take Claritin instead -Advised to use lowest effective dose dose of Aleve -Advised trial off of Myrbetriq to assess efficacy   Subjective: Adrian Davis is an 80 y.o. year old male who is a primary patient of [name], MD.  The CCM team was consulted for assistance with disease management and care coordination needs.    Engaged with patient by telephone for initial visit in response to provider referral for pharmacy case management and/or care coordination services.   Consent to Services:  The patient was given the following information about Chronic Care Management services today, agreed to services, and gave verbal consent: 1. CCM service includes personalized support from designated clinical staff supervised by the primary care provider, including individualized plan of care and coordination with other care providers 2. 24/7 contact phone numbers for assistance for urgent and routine care needs. 3. Service will only be billed when office clinical staff spend 20 minutes or more in a month to coordinate care. 4. Only one practitioner may furnish and bill the service in a calendar month. 5.The patient may stop CCM services at any time (effective at the end of the month) by phone call to the office staff. 6. The patient will be responsible for cost sharing (co-pay) of up to 20% of the service fee (after annual  deductible is met). Patient agreed to services and consent obtained.  Patient Care Team: [name], MD as PCP - General (Family Medicine) [name], [facility] as Pharmacist (Pharmacist)  Recent office visits: 03/14/21-PCP-Patient presented for fatigue ongoing 4 weeks.Labs ordered(overall ok - kidneys, liver, thyroid, vitamin B12 levels and blood counts normal) Iron levels only slightly low. Continue boost supplement. Look at iron rich diet handout. We could also consider adding oral iron supplement MWF, watching for worsening constipation. If desires to try this, may pick up OTC ferrous sulfate 337m (65 FE).   02/02/21 Dr Copland-Family Medicine- Patient presented for toe pain.Get:  Urea Cream 441%with salicyclic acid.Use on toe 2 times daily for 1 week,use pumice stone on corn.ciclopirox (PENLAC) 8 % solution  Recent consult visits: 04/11/21-Cardiology-Patient presented for follow up hypertension and HLD.No medication changes follow up 6 months  02/01/21-Orthopedic surgery- no data found  Hospital visits: None in previous 6 months   Objective:  Lab Results  Component Value Date   CREATININE 1.03 04/07/2021   BUN 25 04/07/2021   GFR 75.55 03/14/2021   GFRNONAA >60 11/11/2020   GFRAA 80 05/05/2020   NA 139 04/07/2021   K 3.8 04/07/2021   CALCIUM 9.8 04/07/2021   CO2 22 04/07/2021   GLUCOSE 99 04/07/2021    Lab Results  Component Value Date/Time   HGBA1C 5.8 11/02/2020 09:57 AM   HGBA1C (H) 11/12/2010 08:28 PM    5.7 (NOTE)  According to the ADA Clinical Practice Recommendations for 2011, when HbA1c is used as a screening test:   >=6.5%   Diagnostic of Diabetes Mellitus           (if abnormal result  is confirmed)  5.7-6.4%   Increased risk of developing Diabetes Mellitus  References:Diagnosis and Classification of Diabetes Mellitus,Diabetes YIFO,2774,12(INOMV 1):S62-S69 and Standards of Medical  Care in         Diabetes - 2011,Diabetes [facility],9470,96  (Suppl 1):S11-S61.   GFR 75.55 03/14/2021 02:36 PM   GFR 86.83 11/02/2020 09:57 AM    Last diabetic Eye exam: No results found for: HMDIABEYEEXA  Last diabetic Foot exam: No results found for: HMDIABFOOTEX   Lab Results  Component Value Date   CHOL 143 04/07/2021   HDL 37 (L) 04/07/2021   LDLCALC 73 04/07/2021   LDLDIRECT 102.0 05/15/2011   TRIG 195 (H) 04/07/2021   CHOLHDL 3.9 04/07/2021    Hepatic Function Latest Ref Rng & Units 04/07/2021 03/14/2021 11/02/2020  Total Protein 6.0 - 8.5 g/dL 6.4 6.4 6.6  Albumin 3.7 - 4.7 g/dL 4.1 4.2 4.3  AST 0 - 40 IU/L _0 ALT 0 - 44 IU/L _1 Alk Phosphatase 44 - 121 IU/L 71 63 55  Total Bilirubin 0.0 - 1.2 mg/dL 0.3 0.3 0.4  Bilirubin, Direct 0.00 - 0.40 mg/dL <0.10 - -    Lab Results  Component Value Date/Time   TSH 1.86 03/14/2021 02:36 PM   TSH 2.42 01/21/2020 11:28 AM   FREET4 0.81 04/01/2019 10:24 AM    CBC Latest Ref Rng & Units 03/14/2021 11/11/2020 11/02/2020  WBC 4.0 - 10.5 K/uL 7.6 14.5(H) 5.8  Hemoglobin 13.0 - 17.0 g/dL 13.8 11.1(L) 14.1  Hematocrit 39.0 - 52.0 % 41.3 33.6(L) 41.6  Platelets 150.0 - 400.0 K/uL 234.0 214 239.0    No results found for: VD25OH  Clinical ASCVD: No  The 10-year ASCVD risk score (Arnett DK, et al., 2019) is: 41.1%   Values used to calculate the score:     Age: 80 years     Sex: Male     Is Non-Hispanic African American: No     Diabetic: No     Tobacco smoker: No     Systolic Blood Pressure: 283 mmHg     Is BP treated: Yes     HDL Cholesterol: 37 mg/dL     Total Cholesterol: 143 mg/dL    Depression screen PHQ 2/9 01/03/2017  Decreased Interest 0  Down, Depressed, Hopeless 0  PHQ - 2 Score 0     Social History   Tobacco Use  Smoking Status Former   Packs/day: 1.50   Years: 10.00   Pack years: 15.00   Types: Cigarettes   Quit date: 05/15/1976   Years since quitting: 45.2  Smokeless Tobacco Current   Types: Chew   Tobacco Comments   chews tobacco   BP Readings from Last 3 Encounters:  04/11/21 (!) 142/70  03/14/21 120/72  02/02/21 138/78   Pulse Readings from Last 3 Encounters:  04/11/21 69  03/14/21 86  02/02/21 93   Wt Readings from Last 3 Encounters:  04/11/21 177 lb (80.3 kg)  03/14/21 177 lb 9 oz (80.5 kg)  02/02/21 179 lb 8 oz (81.4 kg)   BMI Readings from Last 3 Encounters:  04/11/21 26.52 kg/m  03/14/21 27.00 kg/m  02/02/21 27.29 kg/m    Assessment/Interventions: Review of patient past medical history, allergies, medications, health status, including review  of consultants reports, laboratory and other test data, was performed as part of comprehensive evaluation and provision of chronic care management services.   SDOH:  (Social Determinants of Health) assessments and interventions performed: Yes  SDOH Screenings   Alcohol Screen: Not on file  Depression (PHQ2-9): Not on file  Financial Resource Strain: Not on file  Food Insecurity: Not on file  Housing: Not on file  Physical Activity: Not on file  Social Connections: Not on file  Stress: Not on file  Tobacco Use: High Risk   Smoking Tobacco Use: Former   Smokeless Tobacco Use: Current  Transportation Needs: Not on file    [place]  No Known Allergies  Medications Reviewed Today     Reviewed by [name], [facility] (Pharmacist) on 08/05/21 at [place] List Status: <None>   Medication Order Taking? Sig Documenting Provider Last Dose Status Informant  acetaminophen (TYLENOL) 650 MG CR tablet [id number] Yes Take 650 mg by mouth every 8 (eight) hours as needed for pain. [provider] Taking Active Spouse/Significant Other  amLODipine (NORVASC) 2.5 MG tablet [id number] Yes TAKE 2 TABLETS BY MOUTH EVERY MORNING AND 1 TABLET IN THE AFTERNOON AS DIRECTED  Patient taking differently: Take 2.5 mg by mouth 2 (two) times daily. TAKE 2 TABLETS BY MOUTH EVERY MORNING AND 1 TABLET IN THE AFTERNOON AS  DIRECTED   Martinique, Peter M, MD Taking Active   benazepril (LOTENSIN) 20 MG tablet [id number] Yes TAKE 1 TABLET(20 MG) BY MOUTH TWICE DAILY Martinique, Peter M, MD Taking Active   Cyanocobalamin (B-12) 1000 MCG SUBL [id number] Yes Place 1 tablet under the tongue every other day. [name], MD Taking Active   diphenhydrAMINE (BENADRYL) 25 MG tablet [id number] Yes Take 25 mg by mouth at bedtime. [provider] Taking Active   docusate sodium (COLACE) 100 MG capsule [id number] Yes Take 1 capsule (100 mg total) by mouth 2 (two) times daily. Martinique, Peter M, MD Taking Active Spouse/Significant Other  famotidine (PEPCID) 20 MG tablet [id number] Yes TAKE 1 TABLET(20 MG) BY MOUTH TWICE DAILY [name], MD Taking Active   hydrochlorothiazide (HYDRODIURIL) 25 MG tablet [id number] Yes TAKE 1 TABLET BY MOUTH ONCE A DAY AS NEEDED FOR SWELLING [place]ique, Peter M, MD Taking Active   loratadine (CLARITIN) 10 MG tablet [id number] Yes Take 10 mg by mouth daily as needed for allergies. [provider] Taking Active   MYRBETRIQ 25 MG TB24 tablet [id number] Yes TAKE 1 TABLET BY MOUTH ONCE A DAY [name], MD Taking Active   naproxen sodium (ALEVE) 220 MG tablet [id number] Yes Take 220 mg by mouth. [provider] Taking Active   pantoprazole (PROTONIX) 40 MG tablet [id number] Yes Take 1 tablet (40 mg total) by mouth 2 (two) times daily. [place], Peter M, MD Taking Active   polyethylene glycol powder [facility]) 17 GM/SCOOP powder [id number] Yes TAKE 17 GRAMS BY MOUTH TWICE DAILY Mansouraty, Telford Nab., MD Taking Active   potassium chloride (KLOR-CON) 10 MEQ tablet [id number] Yes TAKE 1 TABLET BY MOUTH ONCE A DAY [place]ique, Peter M, MD Taking Active   sildenafil (REVATIO) 20 MG tablet [id number]  Take 2 to 5 tablets as needed [place]ique, Peter M, MD  Active            Med Note Wilmon Pali, MELISSA R   Fri Oct 29, 2020  2:59 PM) ON HOLD   simvastatin (ZOCOR) 10 MG tablet [id number] Yes TAKE 1  TABLET BY MOUTH ONCE A DAY AT [place]  EVENING [place]ique, Peter M, MD Taking Active   tamsulosin [facility]) 0.4 MG CAPS capsule [id number] Yes TAKE 1 CAPSULE BY MOUTH ONCE DAILY AFTERSUPPER [place], Peter M, MD Taking Active             Patient Active Problem List   Diagnosis Date Noted   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   Blood loss anemia 03/14/2021   Fatigue 03/14/2021   Primary osteoarthritis of right hip 11/10/2020   Pre-op evaluation 11/04/2020   Smokeless tobacco use 11/04/2020   History of urinary retention 11/04/2020   Chronic constipation 01/21/2020   Nephrolithiasis 11/14/2018   Acquired complex renal cyst 11/14/2018   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   Lower abdominal pain 07/24/2018   Unintentional weight loss 07/24/2018   Vitamin B12 deficiency 01/21/2018   Acute sinusitis 04/13/2017   Actinic keratosis 01/16/2017   Medicare annual wellness visit, initial 01/03/2017   Counseling about travel 01/03/2017   Advanced care planning/counseling discussion 01/03/2017   Changing skin lesion 12/25/2016   Bilateral impacted cerumen 07/25/2016   Vocal cord leukoplakia    Overweight (BMI 25.0-29.9) 02/22/2016   Paresthesias 02/22/2016   GERD (gastroesophageal reflux disease)    OA (osteoarthritis) of hip    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   Hyperlipidemia    Essential hypertension     Immunization History  Administered Date(s) Administered   Hepatitis A 10/03/2011, 04/10/2012   Influenza, High Dose Seasonal PF 08/27/2017, 08/21/2019, 10/26/2020   Influenza,inj,Quad PF,6+ Mos 07/24/2018   Influenza-Unspecified 08/23/2016   Moderna Sars-Covid-2 Vaccination 11/05/2019, 12/03/2019, 10/19/2020   Pneumococcal Conjugate-13 01/03/2017   Pneumococcal Polysaccharide-23 10/11/2011   Tdap 10/11/2011   Typhoid Inactivated 05/18/2017   Yellow Fever  05/18/2017   Zoster, Live 10/24/2011    Conditions to be addressed/monitored:  Hypertension, Hyperlipidemia, GERD, Osteoarthritis, Overactive Bladder, and BPH  Care Plan : [place]  Updates made by [name], [place] since 08/05/2021 12:00 AM     Problem: Hypertension, Hyperlipidemia, GERD, Osteoarthritis, Overactive Bladder, and BPH   Priority: High     Long-Range Goal: Disease management   Start Date: 08/05/2021  Expected End Date: 08/05/2022  This Visit's Progress: On track  Priority: High  Note:   Current Barriers:  Unable to independently monitor therapeutic efficacy  Pharmacist Clinical Goal(s):  Patient will achieve adherence to monitoring guidelines and medication adherence to achieve therapeutic efficacy through collaboration with PharmD and provider.   Interventions: 1:1 collaboration with [name], MD regarding development and update of comprehensive plan of care as evidenced by provider attestation and co-signature Inter-disciplinary care team collaboration (see longitudinal plan of care) Comprehensive medication review performed; medication list updated in electronic medical record  Hypertension (BP goal <140/90) -Controlled - Pt reports BP at home generally < 140/90; he reports he reduced amlodipine to 2 tab daily and is taking HCTZ every day due to swelling in legs; right now swelling is still there but not particularly bothersome -Current treatment: Amlodipine 2.5 mg BID Benazepril 20 mg BID HCTZ 25 mg daily -Denies hypotensive/hypertensive symptoms -Educated on BP goals and benefits of medications for prevention of heart attack, stroke and kidney damage; Importance of home blood pressure monitoring; -Counseled that amlodipine can cause swelling; if swelling becomes more bothersome he can notify PCP or cardiologist to adjust medications -Counseled to monitor BP at home daily -Recommend to continue current  medication  Hyperlipidemia: (LDL goal <  100) -Controlled - LDL is at goal; pt endorses compliance with statin and denies issues -Current treatment: Simvastatin 10 mg daily -Educated on Cholesterol goals; Benefits of statin for ASCVD risk reduction; -Recommended to continue current medication  GERD (Goal: manage symptoms) -Controlled - Patient is satisfied with current regimen and denies issues -Current treatment  Pantoprazole 40 mg BID Famotidine 20 mg BID -Recommended to continue current medication  Chronic constipation (Goal: manage symptoms) -Not ideally controlled -pt reports constipation symptoms regularly -Current treatment  Docusate 100 mg BID Miralax BID -Counseled on importance of hydration, dietary fiber -Counseled that daily Benadryl may be contributing due to anticholinergic effects -Advised to stop daily Benadryl, use Claritin instead  BPH / OAB (Goal: manage symptoms) -Not ideally controlled - pt reports urinary issues are exacerbated by HCTZ; he is not sure if Myrbetriq is helping -Follows with urology (Dr Jeffie Pollock) -Current treatment  Tamsulosin 0.4 mg daily Myrbetriq 25 mg daily -Medications previously tried: trospium (SE)  -Counseled on benefits of tamsulosin for long term control of BPH -Recommend trial off Myrbetriq to see if there is a change in urinary symptoms (note BP may improve off of Myrbetriq)  Osteoarthritis (Goal: manage pain) -Not ideally controlled - pt reports issues with knees, he is planning for a knee replacement next year; he reports alternating Aleve and Tylenol for pain control -follows with ortho ([name]); receiving injections -Current treatment  Tylenol 650 mg - 2 daily PM Aleve OTC - 2 daily AM -Medications previously tried: meloxicam  -Counseled on NSAID risks - GI bleeding, hypertension, kidney damage; pt endorses aleve is the only thing that helps, Tylenol does not help much; he reports his cardiologist is aware he is taking Aleve  and is ok with it; he is taking a daily PPI which offers some protection against GI bleeding -Advised to use lowest effective dose of Aleve  Health Maintenance -Vaccine gaps: Shingrix, covid booster, Flu -Counseled on missing vaccines; pt is planning to get Covid booster and flu vaccine soon -Current therapy:  Vitamin B12 1000 mcg QOD Klor-Con 10 mEq daily Benadryl 25 mg HS -Counseled on side effects of Benadryl including constipation; advised he stop Benadryl and use Claritin instead  Patient Goals/Self-Care Activities Patient will:  - take medications as prescribed focus on medication adherence by pill box check blood pressure daily -Trial off of Myrbetriq to see if it is helping -Stop Benadryl and take Claritin instead -Use lowest effective dose of Aleve      Medication Assistance: None required.  Patient affirms current coverage meets needs.  Compliance/Adherence/Medication fill history: Care Gaps: Hep C screening  Star-Rating Drugs: Benazepril - LF 07/26/21 x 90 ds Simvastatin - LF 07/26/21 x 90 ds  Patient's preferred pharmacy is:  [facility], [place] [address] [place] [address] Phone: [phone] Fax: [phone]  Uses pill box? Yes Pt endorses 100% compliance  We discussed: Current pharmacy is preferred with insurance plan and patient is satisfied with pharmacy services; [place] offers med sync, pill packaging and delivery; encouraged pt to take advantage of these offerings  Patient decided to: Continue current medication management strategy  Care Plan and Follow Up Patient Decision:  Patient agrees to Care Plan and Follow-up.  Plan: Telephone follow up appointment with care management team member scheduled for:  1 year  [name], PharmD, [place], CPP Clinical Pharmacist [facility] [phone]

## 2021-08-05 NOTE — Patient Instructions (Signed)
Visit Information  Phone number for Pharmacist: [phone]  Thank you for meeting with me to discuss your medications! I look forward to working with you to achieve your health care goals. Below is a summary of what we talked about during the visit:   Goals Addressed             This Visit's Progress    Manage My Medicine       Timeframe:  Long-Range Goal Priority:  Medium Start Date:    08/05/21                         Expected End Date: 08/05/22                      Follow Up Date April 2023   - call for medicine refill 2 or 3 days before it runs out - call if I am sick and can't take my medicine - keep a list of all the medicines I take; vitamins and herbals too - use a pillbox to sort medicine  -Trial off of Myrbetriq to see if it is helping -Stop Benadryl and take Claritin instead -Use lowest effective dose of Aleve   Why is this important?   These steps will help you keep on track with your medicines.   Notes:         Care Plan : [name]  Updates made by Adrian Davis, RPH since 08/05/2021 12:00 AM     Problem: Hypertension, Hyperlipidemia, GERD, Osteoarthritis, Overactive Bladder, and BPH   Priority: High     Long-Range Goal: Disease management   Start Date: 08/05/2021  Expected End Date: 08/05/2022  This Visit's Progress: On track  Priority: High  Note:   Current Barriers:  Unable to independently monitor therapeutic efficacy  Pharmacist Clinical Goal(s):  Patient will achieve adherence to monitoring guidelines and medication adherence to achieve therapeutic efficacy through collaboration with PharmD and provider.   Interventions: 1:1 collaboration with Adrian Bush, MD regarding development and update of comprehensive plan of care as evidenced by provider attestation and co-signature Inter-disciplinary care team collaboration (see longitudinal plan of care) Comprehensive medication review performed; medication list  updated in electronic medical record  Hypertension (BP goal <140/90) -Controlled - Pt reports BP at home generally < 140/90; he reports he reduced amlodipine to 2 tab daily and is taking HCTZ every day due to swelling in legs; right now swelling is still there but not particularly bothersome -Current treatment: Amlodipine 2.5 mg BID Benazepril 20 mg BID HCTZ 25 mg daily -Denies hypotensive/hypertensive symptoms -Educated on BP goals and benefits of medications for prevention of heart attack, stroke and kidney damage; Importance of home blood pressure monitoring; -Counseled that amlodipine can cause swelling; if swelling becomes more bothersome he can notify PCP or cardiologist to adjust medications -Counseled to monitor BP at home daily -Recommend to continue current medication  Hyperlipidemia: (LDL goal < 100) -Controlled - LDL is at goal; pt endorses compliance with statin and denies issues -Current treatment: Simvastatin 10 mg daily -Educated on Cholesterol goals; Benefits of statin for ASCVD risk reduction; -Recommended to continue current medication  GERD (Goal: manage symptoms) -Controlled - Patient is satisfied with current regimen and denies issues -Current treatment  Pantoprazole 40 mg BID Famotidine 20 mg BID -Recommended to continue current medication  Chronic constipation (Goal: manage symptoms) -Not ideally controlled -pt reports constipation symptoms regularly -Current treatment  Docusate 100 mg  BID Miralax BID -Counseled on importance of hydration, dietary fiber -Counseled that daily Benadryl may be contributing due to anticholinergic effects -Advised to stop daily Benadryl, use Claritin instead  BPH / OAB (Goal: manage symptoms) -Not ideally controlled - pt reports urinary issues are exacerbated by HCTZ; he is not sure if Myrbetriq is helping -Follows with urology (Dr Adrian Davis) -Current treatment  Tamsulosin 0.4 mg daily Myrbetriq 25 mg daily -Medications  previously tried: trospium (SE)  -Counseled on benefits of tamsulosin for long term control of BPH -Recommend trial off Myrbetriq to see if there is a change in urinary symptoms (note BP may improve off of Myrbetriq)  Osteoarthritis (Goal: manage pain) -Not ideally controlled - pt reports issues with knees, he is planning for a knee replacement next year; he reports alternating Aleve and Tylenol for pain control -follows with ortho (Dr Adrian Davis); receiving injections -Current treatment  Tylenol 650 mg - 2 daily PM Aleve OTC - 2 daily AM -Medications previously tried: meloxicam  -Counseled on NSAID risks - GI bleeding, hypertension, kidney damage; pt endorses aleve is the only thing that helps, Tylenol does not help much; he reports his cardiologist is aware he is taking Aleve and is ok with it; he is taking a daily PPI which offers some protection against GI bleeding -Advised to use lowest effective dose of Aleve  Health Maintenance -Vaccine gaps: Shingrix, covid booster, Flu -Counseled on missing vaccines; pt is planning to get Covid booster and flu vaccine soon -Current therapy:  Vitamin B12 1000 mcg QOD Klor-Con 10 mEq daily Benadryl 25 mg HS -Counseled on side effects of Benadryl including constipation; advised he stop Benadryl and use Claritin instead  Patient Goals/Self-Care Activities Patient will:  - take medications as prescribed focus on medication adherence by pill box check blood pressure daily -Trial off of Myrbetriq to see if it is helping -Stop Benadryl and take Claritin instead -Use lowest effective dose of Aleve      Adrian Davis was given information about Chronic Care Management services today including:  CCM service includes personalized support from designated clinical staff supervised by his physician, including individualized plan of care and coordination with other care providers 24/7 contact phone numbers for assistance for urgent and routine care  needs. Standard insurance, coinsurance, copays and deductibles apply for chronic care management only during months in which we provide at least 20 minutes of these services. Most insurances cover these services at 100%, however patients may be responsible for any copay, coinsurance and/or deductible if applicable. This service may help you avoid the need for more expensive face-to-face services. Only one practitioner may furnish and bill the service in a calendar month. The patient may stop CCM services at any time (effective at the end of the month) by phone call to the office staff.  Patient agreed to services and verbal consent obtained.   The patient verbalized understanding of instructions, educational materials, and care plan provided today and declined offer to receive copy of patient instructions, educational materials, and care plan.  Telephone follow up appointment with pharmacy team member scheduled for: 1 year  [name], PharmD, [place], CPP Clinical Pharmacist [place] [facility] at [facility] [phone]

## 2021-08-09 ENCOUNTER — Other Ambulatory Visit: Payer: Self-pay | Admitting: Family Medicine

## 2021-08-09 NOTE — Telephone Encounter (Signed)
Last office visit 03/14/2021 for Fatigue.  Meloxicam is not on current medication list. Refill states:            The original prescription was discontinued on 11/11/2020 by Derl Barrow, PA for the following reason: Stop Taking at Discharge    No future appointments with PCP.   Refill?

## 2021-08-10 ENCOUNTER — Telehealth: Payer: Self-pay

## 2021-08-10 NOTE — Telephone Encounter (Signed)
Received pre-op eval form from [employer] [facility] [place].  Pt scheduled on 11/21/20 for left TKA by Dr. [name].  Plz schedule OV for pre-op exam.  [Form is in basket on Lisa's desk.]

## 2021-08-10 NOTE — Telephone Encounter (Signed)
   [facility] Pre-operative Risk Assessment    Patient Name: Adrian Davis  DOB: 1941/02/12 MRN: [id number]  HEARTCARE STAFF:  - IMPORTANT!!!!!! Under Visit Info/Reason for Call, type in Other and utilize the format Clearance MM/DD/YY or Clearance TBD. Do not use dashes or single digits. - Please review there is not already an duplicate clearance open for this procedure. - If request is for dental extraction, please clarify the # of teeth to be extracted. - If the patient is currently at the dentist's office, call Pre-Op Callback Staff (MA/nurse) to input urgent request.  - If the patient is not currently in the dentist office, please route to the Pre-Op pool.  Request for surgical clearance:  What type of surgery is being performed? Left Total Knee Arthroplasty  When is this surgery scheduled? 11/21/2021   What type of clearance is required (medical clearance vs. Pharmacy clearance to hold med vs. Both)? Medical   Are there any medications that need to be held prior to surgery and how long? None   Practice name and name of physician performing surgery? [facility]   What is the office phone number? [phone]   7.   What is the office fax number? [phone]  8.   Anesthesia type (None, local, MAC, general) ? Choice   [name] L Meredyth Hornung 08/10/2021, 10:36 AM  _________________________________________________________________   (provider comments below)

## 2021-08-10 NOTE — Telephone Encounter (Signed)
Patient unavailable 08/10/2021 at 1415.  Patient needs call back.

## 2021-08-10 NOTE — Telephone Encounter (Signed)
   [facility] Pre-operative Risk Assessment    Patient Name: Adrian Davis  DOB: Feb 04, 1941 MRN: [id number]  HEARTCARE STAFF:  - IMPORTANT!!!!!! Under Visit Info/Reason for Call, type in Other and utilize the format Clearance MM/DD/YY or Clearance TBD. Do not use dashes or single digits. - Please review there is not already an duplicate clearance open for this procedure. - If request is for dental extraction, please clarify the # of teeth to be extracted. - If the patient is currently at the dentist's office, call Pre-Op Callback Staff (MA/nurse) to input urgent request.  - If the patient is not currently in the dentist office, please route to the Pre-Op pool.  Request for surgical clearance:  What type of surgery is being performed? Left total knee arthoplasty  When is this surgery scheduled? 11/21/21  What type of clearance is required (medical clearance vs. Pharmacy clearance to hold med vs. Both)? Both   Are there any medications that need to be held prior to surgery and how long? ASA, asking for instructions   Practice name and name of physician performing surgery? [facility], Dr [name]   What is the office phone number? [phone]   7.   What is the office fax number? [phone]  8.   Anesthesia type (None, local, MAC, general) ? Choice   Jacqulynn Cadet 08/10/2021, 1:53 PM  _________________________________________________________________   (provider comments below)

## 2021-08-11 NOTE — Telephone Encounter (Signed)
Called patient and got him in on 11/16 @ 9

## 2021-08-11 NOTE — Telephone Encounter (Signed)
Noted  

## 2021-08-11 NOTE — Telephone Encounter (Signed)
   Primary Cardiologist: [name], MD  Chart reviewed as part of pre-operative protocol coverage. Given past medical history and time since last visit, based on ACC/AHA guidelines, Adrian Davis would be at acceptable risk for the planned procedure without further cardiovascular testing.   His aspirin may be held for 5 to 7 days prior to his surgery.  Please resume as soon as hemostasis is achieved  I will route this recommendation to the requesting party via Epic fax function and remove from pre-op pool.  Please call with questions.  Jossie Ng. Yasuko Lapage NP-C    08/11/2021, 7:15 AM [address] Office [phone] Fax [phone]

## 2021-08-12 ENCOUNTER — Telehealth: Payer: Self-pay | Admitting: Cardiology

## 2021-08-12 DIAGNOSIS — E7800 Pure hypercholesterolemia, unspecified: Secondary | ICD-10-CM

## 2021-08-12 DIAGNOSIS — I10 Essential (primary) hypertension: Secondary | ICD-10-CM

## 2021-08-12 NOTE — Telephone Encounter (Signed)
Returned call to patient's wife left message on personal voice mail to call back. 

## 2021-08-12 NOTE — Telephone Encounter (Signed)
Pts wife is wanting to speak with nurse Malachy Mood in regards to recall appts as well as possible pre op for the pts upcoming procedure in January... please advise

## 2021-08-15 NOTE — Telephone Encounter (Signed)
Patient's wife returned your call

## 2021-08-15 NOTE — Telephone Encounter (Signed)
Spoke to patient's wife she stated husband needs appointment with Dr.Jordan before the end of year.Stated he is scheduled to have a knee replacement 11/21/21.Appointment scheduled with Dr.Jordan 11/30 at 10:30 am.He will have fasting lab bmet,lipid and hepatic panels 1 week before appointment.

## 2021-09-03 DIAGNOSIS — Z23 Encounter for immunization: Secondary | ICD-10-CM | POA: Diagnosis not present

## 2021-09-07 ENCOUNTER — Ambulatory Visit: Payer: Medicare Other | Admitting: Family Medicine

## 2021-09-13 DIAGNOSIS — E7800 Pure hypercholesterolemia, unspecified: Secondary | ICD-10-CM | POA: Diagnosis not present

## 2021-09-13 DIAGNOSIS — I10 Essential (primary) hypertension: Secondary | ICD-10-CM | POA: Diagnosis not present

## 2021-09-14 LAB — HEPATIC FUNCTION PANEL
ALT: 9 IU/L (ref 0–44)
AST: 11 IU/L (ref 0–40)
Albumin: 4.5 g/dL (ref 3.7–4.7)
Alkaline Phosphatase: 58 IU/L (ref 44–121)
Bilirubin Total: 0.3 mg/dL (ref 0.0–1.2)
Bilirubin, Direct: <0.1 mg/dL (ref 0.00–0.40)
Total Protein: 6.6 g/dL (ref 6.0–8.5)

## 2021-09-14 LAB — LIPID PANEL
Chol/HDL Ratio: 3.7 ratio (ref 0.0–5.0)
Cholesterol, Total: 149 mg/dL (ref 100–199)
HDL: 40 mg/dL (ref >39)
LDL Chol Calc (NIH): 84 mg/dL (ref 0–99)
Triglycerides: 144 mg/dL (ref 0–149)
VLDL Cholesterol Cal: 25 mg/dL (ref 5–40)

## 2021-09-14 LAB — BASIC METABOLIC PANEL
BUN/Creatinine Ratio: 19 (ref 10–24)
BUN: 19 mg/dL (ref 8–27)
CO2: 26 mmol/L (ref 20–29)
Calcium: 9.5 mg/dL (ref 8.6–10.2)
Chloride: 103 mmol/L (ref 96–106)
Creatinine, Ser: 1 mg/dL (ref 0.76–1.27)
Glucose: 88 mg/dL (ref 70–99)
Potassium: 4 mmol/L (ref 3.5–5.2)
Sodium: 144 mmol/L (ref 134–144)
eGFR: 77 mL/min/{1.73_m2} (ref >59)

## 2021-09-14 NOTE — Progress Notes (Signed)
Adrian Davis Date of Birth: 29-Dec-1940 Medical Record #[id number]  History of Present Illness: Adrian Davis is seen today for pre op clearance for left TKR. He has a history of HTN and HLD. No known CAD. He had a normal Adenosine Myoview back in 2008. His last echo was in January of 2012 showing grade 1 diastolic dysfunction, mild LVH and a normal EF.    He  underwent right THR on 11/10/20 by [name]. He is now scheduled for left TKR. 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 He has spinal stenosis. Has seen Dr Arnoldo Morale and surgery recommended for this as well. Patient doesn't want a plate placed because he was told he wouldn't be able to tie his shoes.   He is doing well from a cardiac standpoint.  He is still working on the farm but much more limited by his knee pain and back pain. Denies any chest pain, SOB, palpitations, dizziness. He does have some dependent edema at the end of the day but goes down at night. Takes HCTZ as needed.   Current Outpatient Medications on File Prior to Visit  Medication Sig Dispense Refill   acetaminophen (TYLENOL) 650 MG CR tablet Take 650 mg by mouth every 8 (eight) hours as needed for pain.     amLODipine (NORVASC) 2.5 MG tablet TAKE 2 TABLETS BY MOUTH EVERY MORNING AND 1 TABLET IN THE AFTERNOON AS DIRECTED (Patient taking differently: Take 2.5 mg by mouth 2 (two) times daily. TAKE 2 TABLETS BY MOUTH EVERY MORNING AND 1 TABLET IN THE AFTERNOON AS DIRECTED) 270 tablet 3   benazepril (LOTENSIN) 20 MG tablet TAKE 1 TABLET(20 MG) BY MOUTH TWICE DAILY 180 tablet 2   Cyanocobalamin (B-12) 1000 MCG SUBL Place 1 tablet under the tongue every other day. 30 each    docusate sodium (COLACE) 100 MG capsule Take 1 capsule (100 mg total) by mouth 2 (two) times daily. 180 capsule 3   famotidine (PEPCID) 20 MG tablet TAKE 1 TABLET(20 MG) BY MOUTH TWICE DAILY 180 tablet 0   hydrochlorothiazide (HYDRODIURIL) 25 MG tablet TAKE 1 TABLET BY MOUTH ONCE A DAY AS NEEDED FOR SWELLING  (Patient taking differently: Take 25 mg by mouth daily. TAKE 1 TABLET BY MOUTH ONCE A DAY AS NEEDED FOR SWELLING) 90 tablet 1   loratadine (CLARITIN) 10 MG tablet Take 10 mg by mouth daily as needed for allergies.     MYRBETRIQ 25 MG TB24 tablet TAKE 1 TABLET BY MOUTH ONCE A DAY 90 tablet 0   naproxen sodium (ALEVE) 220 MG tablet Take 220 mg by mouth.     pantoprazole (PROTONIX) 40 MG tablet Take 1 tablet (40 mg total) by mouth 2 (two) times daily. 180 tablet 2   polyethylene glycol powder (GLYCOLAX/MIRALAX) 17 GM/SCOOP powder TAKE 17 GRAMS BY MOUTH TWICE DAILY 510 g 2   potassium chloride (KLOR-CON) 10 MEQ tablet TAKE 1 TABLET BY MOUTH ONCE A DAY 90 tablet 3   sildenafil (REVATIO) 20 MG tablet Take 2 to 5 tablets as needed 50 tablet 3   simvastatin (ZOCOR) 10 MG tablet TAKE 1 TABLET BY MOUTH ONCE A DAY AT SIXIN THE EVENING 90 tablet 3   tamsulosin (FLOMAX) 0.4 MG CAPS capsule TAKE 1 CAPSULE BY MOUTH ONCE DAILY AFTERSUPPER 90 capsule 3   No current facility-administered medications on file prior to visit.    No Known Allergies  Past Medical History:  Diagnosis Date   Abnormal echocardiogram Jan 2012  grade 1 diastolic dysfunction, normal EF, mild LVH   Anemia    Basal cell carcinoma 04/08/2019   left nose inferior to nasal alar groove. EDC: 05/20/2019   Bell palsy 10/2010   presented with facial numbness   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    Degenerative disk disease    GERD (gastroesophageal reflux disease)    History of kidney stones    Hyperlipidemia    Hypertension    dr Admire Bunnell Martinique   Kidney cysts    Lumbar stenosis    Nephrolithiasis    Normal nuclear stress test 2008   Osteoarthritis    Skin cancer    Vocal cord leukoplakia    Dr Erik Obey    Past Surgical History:  Procedure Laterality Date   COLONOSCOPY  04/2019   TAx2, hemorrhoids, rpt 84yrs ([place])   ESOPHAGOGASTRODUODENOSCOPY  04/2019   erosive gastropathy, biopsy negative for  barrett's ([place])   KNEE ARTHROSCOPY Right    LUMBAR LAMINECTOMY/DECOMPRESSION MICRODISCECTOMY N/A 11/12/2013   Ophelia Charter, MD - complicated by urinary retention   SHOULDER SURGERY Bilateral    TONSILLECTOMY     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   Procedure: TOTAL HIP ARTHROPLASTY ANTERIOR APPROACH;  Surgeon: [name], MD;  Location: [facility];  Service: Orthopedics;  Laterality: Right;  163min    Social History   Tobacco Use  Smoking Status Former   Packs/day: 1.50   Years: 10.00   Pack years: 15.00   Types: Cigarettes   Quit date: 05/15/1976   Years since quitting: 45.3  Smokeless Tobacco Current   Types: Chew  Tobacco Comments   chews tobacco    Social History   Substance and Sexual Activity  Alcohol Use Yes   Comment: rare    Family History  Problem Relation Age of Onset   Heart failure Father 32   Hypertension Father    Arthritis Father    Anemia Mother        needed regular transfusions   Prostate cancer Son    Cancer Neg Hx    Diabetes Neg Hx    Colon cancer Neg Hx    Rectal cancer Neg Hx    Stomach cancer Neg Hx    Inflammatory bowel disease Neg Hx    Liver disease Neg Hx    Pancreatic cancer Neg Hx     Review of Systems: The review of systems is per the HPI.  All other systems were reviewed and are negative.  Physical Exam: BP 118/62   Pulse 74   Ht 5\' 8"  (1.727 m)   Wt 179 lb 12.8 oz (81.6 kg)   SpO2 92%   BMI 27.34 kg/m  GENERAL:  Well appearing WM in NAD HEENT:  PERRL, EOMI, sclera are clear. Oropharynx is clear. NECK:  No jugular venous distention, carotid upstroke brisk and symmetric, no bruits, no thyromegaly or adenopathy LUNGS:  Clear to auscultation bilaterally CHEST:  Unremarkable HEART:  RRR,  PMI not displaced or sustained,S1 and S2 within normal limits, no S3, no S4: no clicks, no rubs, no murmurs ABD:  Soft, nontender. BS +, no masses or bruits. No hepatomegaly, no splenomegaly EXT:  2 + pulses throughout,  no edema, no cyanosis no clubbing SKIN:  Warm and dry.  No rashes NEURO:  Alert and oriented x 3. Cranial nerves II through XII intact. PSYCH:  Cognitively intact      Laboratory data: Lab Results  Component Value Date   WBC 7.6 03/14/2021  HGB 13.8 03/14/2021   HCT 41.3 03/14/2021   PLT 234.0 03/14/2021   GLUCOSE 88 09/13/2021   CHOL 149 09/13/2021   TRIG 144 09/13/2021   HDL 40 09/13/2021   LDLDIRECT 102.0 05/15/2011   LDLCALC 84 09/13/2021   ALT 9 09/13/2021   AST 11 09/13/2021   NA 144 09/13/2021   K 4.0 09/13/2021   CL 103 09/13/2021   CREATININE 1.00 09/13/2021   BUN 19 09/13/2021   CO2 26 09/13/2021   TSH 1.86 03/14/2021   PSA 1.50 04/01/2019   INR 1.0 11/02/2020   HGBA1C 5.8 11/02/2020   Ecg today shows NSR with nonspecific ST-T wave abnormality. Rate 74. I have personally reviewed and interpreted this study.  Assessment / Plan: 1. HTN well controlled.  Continue amlodipine and lotensin. Only uses HCTZ prn for swelling.  2. Hyperlipidemia. Labs look good. Continue current therapy  3. Pre op clearance. Patient is at low risk for knee or spine surgery. He feels the knee needs to be done first due to his pain but I am concerned he has neuropathic involvement from his spine. This will need to be worked out between [name] and Dr Arnoldo Morale.     Follow up in 6 months.

## 2021-09-21 ENCOUNTER — Other Ambulatory Visit: Payer: Self-pay

## 2021-09-21 ENCOUNTER — Ambulatory Visit: Payer: Medicare Other | Admitting: Cardiology

## 2021-09-21 ENCOUNTER — Encounter: Payer: Self-pay | Admitting: Cardiology

## 2021-09-21 ENCOUNTER — Ambulatory Visit (INDEPENDENT_AMBULATORY_CARE_PROVIDER_SITE_OTHER): Payer: Medicare Other | Admitting: Cardiology

## 2021-09-21 VITALS — BP 118/62 | HR 74 | Ht 68.0 in | Wt 179.8 lb

## 2021-09-21 DIAGNOSIS — E7800 Pure hypercholesterolemia, unspecified: Secondary | ICD-10-CM

## 2021-09-21 DIAGNOSIS — Z01818 Encounter for other preprocedural examination: Secondary | ICD-10-CM

## 2021-09-21 DIAGNOSIS — I10 Essential (primary) hypertension: Secondary | ICD-10-CM

## 2021-09-21 DIAGNOSIS — Z01810 Encounter for preprocedural cardiovascular examination: Secondary | ICD-10-CM | POA: Diagnosis not present

## 2021-09-21 MED ORDER — AMLODIPINE BESYLATE 2.5 MG PO TABS: 2.5000 mg | ORAL_TABLET | Freq: Two times a day (BID) | ORAL | 3 refills | Status: DC

## 2021-09-21 NOTE — Addendum Note (Signed)
Addended by: Martinique, Tameia Rafferty M on: 09/21/2021 10:58 AM   Modules accepted: Orders

## 2021-09-26 DIAGNOSIS — Z23 Encounter for immunization: Secondary | ICD-10-CM | POA: Diagnosis not present

## 2021-09-30 ENCOUNTER — Telehealth: Payer: Self-pay | Admitting: Family Medicine

## 2021-09-30 MED ORDER — FAMOTIDINE 20 MG PO TABS: ORAL_TABLET | ORAL | 0 refills | Status: DC

## 2021-09-30 NOTE — Telephone Encounter (Signed)
E-scribed refill 

## 2021-09-30 NOTE — Telephone Encounter (Signed)
  Encourage patient to contact the pharmacy for refills or they can request refills through [place]:  Please schedule appointment if longer than 1 year  NEXT APPOINTMENT DATE:11/11/2021  MEDICATION:famotidine (PEPCID) 20 MG tablet  Is the patient out of medication? yes  PHARMACY:[facility] [place]  Let patient know to contact pharmacy at the end of the day to make sure medication is ready.  Please notify patient to allow 48-72 hours to process  CLINICAL FILLS OUT ALL BELOW:   LAST REFILL:  QTY:  REFILL DATE:    OTHER COMMENTS:    Okay for refill?  Please advise

## 2021-10-04 DIAGNOSIS — M5136 Other intervertebral disc degeneration, lumbar region: Secondary | ICD-10-CM | POA: Diagnosis not present

## 2021-10-04 DIAGNOSIS — M47816 Spondylosis without myelopathy or radiculopathy, lumbar region: Secondary | ICD-10-CM | POA: Diagnosis not present

## 2021-10-04 DIAGNOSIS — Z6826 Body mass index (BMI) 26.0-26.9, adult: Secondary | ICD-10-CM | POA: Diagnosis not present

## 2021-10-04 DIAGNOSIS — I10 Essential (primary) hypertension: Secondary | ICD-10-CM | POA: Diagnosis not present

## 2021-10-04 DIAGNOSIS — M5416 Radiculopathy, lumbar region: Secondary | ICD-10-CM | POA: Diagnosis not present

## 2021-10-11 ENCOUNTER — Ambulatory Visit: Payer: Medicare Other | Admitting: Family Medicine

## 2021-10-11 DIAGNOSIS — M1712 Unilateral primary osteoarthritis, left knee: Secondary | ICD-10-CM | POA: Diagnosis not present

## 2021-10-11 DIAGNOSIS — M25662 Stiffness of left knee, not elsewhere classified: Secondary | ICD-10-CM | POA: Diagnosis not present

## 2021-10-11 DIAGNOSIS — M25562 Pain in left knee: Secondary | ICD-10-CM | POA: Diagnosis not present

## 2021-10-11 DIAGNOSIS — M179 Osteoarthritis of knee, unspecified: Secondary | ICD-10-CM | POA: Insufficient documentation

## 2021-10-26 ENCOUNTER — Encounter: Payer: Self-pay | Admitting: Podiatry

## 2021-10-26 ENCOUNTER — Other Ambulatory Visit: Payer: Self-pay

## 2021-10-26 ENCOUNTER — Other Ambulatory Visit: Payer: Self-pay | Admitting: Cardiology

## 2021-10-26 ENCOUNTER — Ambulatory Visit (INDEPENDENT_AMBULATORY_CARE_PROVIDER_SITE_OTHER): Payer: Medicare Other | Admitting: Podiatry

## 2021-10-26 ENCOUNTER — Telehealth: Payer: Self-pay

## 2021-10-26 DIAGNOSIS — D2372 Other benign neoplasm of skin of left lower limb, including hip: Secondary | ICD-10-CM

## 2021-10-26 NOTE — Progress Notes (Signed)
He presents today chief complaint of a painful corn to the fifth digit of the left foot.  Objective: Pulses remain palpable.  No open lesions or wounds.  He has adductovarus rotated fifth digit resulting in medial benign skin lesion to the fifth digit left.  Assessment: Benign skin lesion medial aspect of the fifth digit left foot with hammertoe deformity.  Plan: Debridement of the lesion today and placed padding follow-up with him on an as-needed basis.  We once again discussed surgery.  He will be having knee replacement left knee in the next few weeks.

## 2021-10-26 NOTE — Progress Notes (Signed)
Chronic Care Management Pharmacy Assistant   Name: Adrian Davis  MRN: [id number] DOB: Jul 31, 1941  Reason for Encounter: CCM (Hypertension Disease State)   Recent office visits:  None since last CCM contact  Recent consult visits:  10/26/2021 - [name], [place] - Patient presented for toe pain. Procedure: Debridement of lesion; placed padding.  10/11/2021 - [name] - Physical Therapist - Patient presented for stiffness of left knee, pain of left knee joint and osteoarthritis of left knee joint.  10/04/2021 - [name], NP - [facility] - Patient presented for body mass index 26.0-26.9, Lumbar facet arthropathy, hypertension, DDD - Lumbar and Radiculopathy.  09/21/2021 - [name], MD - Cardiology - Patient presented for hypertension and pre-op exam. Labs: EKG. Start: amLODipine (NORVASC) 2.5 MG tablet - Take 1 tablet (2.5 mg total) by mouth 2 (two) times daily. Stop due to patient preference: MYRBETRIQ 25 MG TB24 tablet.   Hospital visits:  None in previous 6 months  Medications: Outpatient Encounter Medications as of 10/26/2021  Medication Sig Note   acetaminophen (TYLENOL) 650 MG CR tablet Take 650 mg by mouth every 8 (eight) hours as needed for pain.    amLODipine (NORVASC) 2.5 MG tablet Take 1 tablet (2.5 mg total) by mouth 2 (two) times daily.    benazepril (LOTENSIN) 20 MG tablet TAKE 1 TABLET BY MOUTH TWICE A DAY    Cyanocobalamin (B-12) 1000 MCG SUBL Place 1 tablet under the tongue every other day.    docusate sodium (COLACE) 100 MG capsule Take 1 capsule (100 mg total) by mouth 2 (two) times daily.    famotidine (PEPCID) 20 MG tablet TAKE 1 TABLET(20 MG) BY MOUTH TWICE DAILY    hydrochlorothiazide (HYDRODIURIL) 25 MG tablet TAKE 1 TABLET BY MOUTH ONCE A DAY AS NEEDED FOR SWELLING (Patient taking differently: Take 25 mg by mouth daily. TAKE 1 TABLET BY MOUTH ONCE A DAY AS NEEDED FOR SWELLING)    loratadine (CLARITIN) 10 MG tablet  Take 10 mg by mouth daily as needed for allergies.    meloxicam (MOBIC) 7.5 MG tablet Take 7.5 mg by mouth daily.    MYRBETRIQ 25 MG TB24 tablet Take 25 mg by mouth daily.    pantoprazole (PROTONIX) 40 MG tablet Take 1 tablet (40 mg total) by mouth 2 (two) times daily.    polyethylene glycol powder (GLYCOLAX/MIRALAX) 17 GM/SCOOP powder TAKE 17 GRAMS BY MOUTH TWICE DAILY    potassium chloride (KLOR-CON) 10 MEQ tablet TAKE 1 TABLET BY MOUTH ONCE A DAY    sildenafil (REVATIO) 20 MG tablet Take 2 to 5 tablets as needed 10/29/2020: ON HOLD    simvastatin (ZOCOR) 10 MG tablet TAKE 1 TABLET BY MOUTH ONCE A DAY AT SIXIN THE EVENING    tamsulosin (FLOMAX) 0.4 MG CAPS capsule TAKE 1 CAPSULE BY MOUTH ONCE DAILY AFTERSUPPER    No facility-administered encounter medications on file as of 10/26/2021.     Recent Office Vitals: BP Readings from Last 3 Encounters:  09/21/21 118/62  04/11/21 (!) 142/70  03/14/21 120/72   Pulse Readings from Last 3 Encounters:  09/21/21 74  04/11/21 69  03/14/21 86    Wt Readings from Last 3 Encounters:  09/21/21 179 lb 12.8 oz (81.6 kg)  04/11/21 177 lb (80.3 kg)  03/14/21 177 lb 9 oz (80.5 kg)     Kidney Function Lab Results  Component Value Date/Time   CREATININE 1.00 09/13/2021 07:53 AM   CREATININE 1.03 04/07/2021  07:20 AM   CREATININE 0.85 01/27/2016 11:36 AM   CREATININE 0.88 06/02/2015 10:26 AM   GFR 75.55 03/14/2021 02:36 PM   GFRNONAA >60 11/11/2020 03:49 AM   GFRAA 80 05/05/2020 07:31 AM    BMP Latest Ref Rng & Units 09/13/2021 04/07/2021 03/14/2021  Glucose 70 - 99 mg/dL 88 99 115(H)  BUN 8 - 27 mg/dL 19 25 22   Creatinine 0.76 - 1.27 mg/dL 1.00 1.03 0.96  BUN/Creat Ratio 10 - 24 19 24  -  Sodium 134 - 144 mmol/L 144 139 140  Potassium 3.5 - 5.2 mmol/L 4.0 3.8 3.9  Chloride 96 - 106 mmol/L 103 104 103  CO2 20 - 29 mmol/L 26 22 30   Calcium 8.6 - 10.2 mg/dL 9.5 9.8 9.5   Contacted patient on 10/26/2021 to discuss hypertension disease  state  Current antihypertensive regimen:  Amlodipine 2.5 mg BID Benazepril 20 mg BID HCTZ 25 mg daily  Patient verbally confirms he is taking the above medications as directed. Yes  How often are you checking your Blood Pressure? Patient takes his blood pressure daily, but did not have a log. I asked patient to record his readings starting tomorrow and I would call him back on 11/04/2021 for his readings. Patient understood and agreed.   Wrist or arm cuff: Arm Caffeine intake: Patient drinks coffee about 2 cups a day Salt intake: Patient watches his salt intake  OTC medications including pseudoephedrine or NSAIDs? Tylenol - 1 time a day for hip pain. Patient was not near his medication to advise me on strength.  Any readings above 180/120? No  What recent interventions/DTPs have been made by any provider to improve Blood Pressure control since last CPP Visit:  No recent interventions.   Any recent hospitalizations or ED visits since last visit with CPP? No  What diet changes have been made to improve Blood Pressure Control?  Patient watches what he eats.   What exercise is being done to improve your Blood Pressure Control?  Patient walks and does knee replacement exercises 3-4 times a week. Patient rides a stationary bike as well.   Adherence Review: Is the patient currently on ACE/ARB medication? No Does the patient have >5 day gap between last estimated fill dates? No  Star Rating Drugs:  Medication:  Last Fill: Day Supply Benazepril 20 mg 10/26/2021 90  Simvastatin 10 mg 10/26/2021 90 Verified fill dates with [place] Gaps: Annual wellness visit in last year? No Most Recent BP reading: 118/62 on 09/21/2021  PCP appointment on 11/11/2021  [name], CPP notified  Marijean Niemann, [place] [phone]  Time Spent: 2 Minutes

## 2021-10-27 DIAGNOSIS — H6123 Impacted cerumen, bilateral: Secondary | ICD-10-CM | POA: Diagnosis not present

## 2021-11-03 DIAGNOSIS — M1712 Unilateral primary osteoarthritis, left knee: Secondary | ICD-10-CM | POA: Diagnosis not present

## 2021-11-04 NOTE — Progress Notes (Addendum)
Called patient for his updated blood pressure log.  Date  Blood Pressure  01/13  137/74 01/12  138/74 01/11  144/79 01/10  136/75 01/09  136/73 01/08  139/75 01/07  146/76 01/06  ---------- 01/05  141/75  I also spoke with patient's wife. She stated patient had a pre-op visit with Cr. Aluisio who will be performing patient's knee surgery on 11/21/2021. Dr. Anne Fu office stated patient needs to be cleared for surgery with Dr. Danise Mina and Dr. Anne Fu office needs written permission to perform the surgery. Patient has an appointment with Dr. Danise Mina on 11/11/2021, but they are concerned with going to a different location. Patient's wife states she doesn't know how to get there and it would be really inconvenient. Last time she was seen at the [place] location a friend had to take her. They would like to know if they must be seen in the office for the pre-op clearance or if there is something else they can do. She would like someone from Dr. Danise Mina to call her.   [name], CPP notified  Marijean Niemann, [place] Pharmacy Assistant [phone]   PharmD Addendum: -Pt's wife has already spoken with office staff, surgical clearance visit must be in person.  [name], [facility] 11/08/21 4:20 PM

## 2021-11-07 ENCOUNTER — Telehealth: Payer: Self-pay | Admitting: Family Medicine

## 2021-11-07 NOTE — Telephone Encounter (Signed)
Pt wife Casalino called wants to know if pt appointment that is schedule for 1/20 /23 can be done as a  phone visit .  Would like a call back Please advise [phone]

## 2021-11-07 NOTE — Telephone Encounter (Signed)
Spoke with Adrian Davis informing him he will need to come into the office for his visit due to testing that is needed for the surgical clearance.  Adrian Davis verbalizes understanding and will come in.

## 2021-11-09 ENCOUNTER — Ambulatory Visit: Payer: Medicare Other | Admitting: Dermatology

## 2021-11-11 ENCOUNTER — Ambulatory Visit (INDEPENDENT_AMBULATORY_CARE_PROVIDER_SITE_OTHER)
Admission: RE | Admit: 2021-11-11 | Discharge: 2021-11-11 | Disposition: A | Discharge status: Home / Self Care | Payer: Medicare Other | Source: Ambulatory Visit | Attending: Family Medicine | Admitting: Family Medicine

## 2021-11-11 ENCOUNTER — Ambulatory Visit (INDEPENDENT_AMBULATORY_CARE_PROVIDER_SITE_OTHER): Payer: Medicare Other | Admitting: Family Medicine

## 2021-11-11 ENCOUNTER — Other Ambulatory Visit: Payer: Self-pay

## 2021-11-11 ENCOUNTER — Encounter: Payer: Self-pay | Admitting: Family Medicine

## 2021-11-11 VITALS — BP 132/68 | HR 78 | Temp 97.7°F | Ht 68.0 in | Wt 178.1 lb

## 2021-11-11 DIAGNOSIS — M1712 Unilateral primary osteoarthritis, left knee: Secondary | ICD-10-CM

## 2021-11-11 DIAGNOSIS — Z01818 Encounter for other preprocedural examination: Secondary | ICD-10-CM

## 2021-11-11 DIAGNOSIS — I10 Essential (primary) hypertension: Secondary | ICD-10-CM

## 2021-11-11 DIAGNOSIS — R739 Hyperglycemia, unspecified: Secondary | ICD-10-CM | POA: Diagnosis not present

## 2021-11-11 DIAGNOSIS — R21 Rash and other nonspecific skin eruption: Secondary | ICD-10-CM | POA: Insufficient documentation

## 2021-11-11 DIAGNOSIS — Z87898 Personal history of other specified conditions: Secondary | ICD-10-CM

## 2021-11-11 DIAGNOSIS — M48062 Spinal stenosis, lumbar region with neurogenic claudication: Secondary | ICD-10-CM | POA: Diagnosis not present

## 2021-11-11 DIAGNOSIS — K5909 Other constipation: Secondary | ICD-10-CM

## 2021-11-11 LAB — POCT GLYCOSYLATED HEMOGLOBIN (HGB A1C): Hemoglobin A1C: 5.6 % (ref 4.0–5.6)

## 2021-11-11 LAB — POC URINALSYSI DIPSTICK (AUTOMATED)
Bilirubin, UA: NEGATIVE
Blood, UA: NEGATIVE
Glucose, UA: NEGATIVE
Ketones, UA: NEGATIVE
Leukocytes, UA: NEGATIVE
Nitrite, UA: NEGATIVE
Protein, UA: NEGATIVE
Spec Grav, UA: 1.015 (ref 1.010–1.025)
Urobilinogen, UA: 0.2 E.U./dL
pH, UA: 5.5 (ref 5.0–8.0)

## 2021-11-11 NOTE — Progress Notes (Addendum)
Patient ID: Adrian Davis, male    DOB: September 24, 1941, 81 y.o.   MRN: [id number]  This visit was conducted in person.  BP 132/68    Pulse 78    Temp 97.7 F (36.5 C) (Temporal)    Ht 5\' 8"  (1.727 m)    Wt 178 lb 1 oz (80.8 kg)    SpO2 97%    BMI 27.07 kg/m    CC: preop evaluation Subjective:   HPI: Adrian Davis is a 81 y.o. male presenting on 11/11/2021 for Pre-op Exam (Scheduled for Lt [facility] on 11/21/21 by Dr. [name].  Pt accompanied by wife, Aram Beecham. )   Rakwon F Wilmarth  has a past medical history of Abnormal echocardiogram (Jan 2012), Anemia, Basal cell carcinoma (04/08/2019), Bell palsy (10/2010), 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, Degenerative disk disease, GERD (gastroesophageal reflux disease), History of kidney stones, Hyperlipidemia, Hypertension, Kidney cysts, Lumbar stenosis, Nephrolithiasis, Normal nuclear stress test (2008), Osteoarthritis, Skin cancer, and Vocal cord leukoplakia.  Planned upcoming L knee replacement by Dr Wynelle Link scheduled for 11/21/2021.    Patient has tolerated anesthesia well in the past.  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  Denies trouble with post-op nausea/vomiting, or trouble awakening after surgery. He did have post operative urinary retention after back surgery needing urinary catheterization but did well after hip replacement.   Denies current chest pain, dyspnea, palpitations, leg swelling, HA, dizziness.  No fevers/chills, coughing, abd pain, diarrhea.   He has already received cardiac clearance (09/21/2021 - Dr Martinique) - he may hold aspirin prior to his knee surgery per surgeon's discretion.   Endorsing bilateral foot pain and R foot drop for months (known spinal stenosis) - has seen neurosurgery and surgical intervention was recommended however he declined. Has seen podiatry for corn to left 4th digit s/p treatment. Was also recommended surgical intervention for hammer toe to that area.   New  rash for a month to underarms and into L forearm - improving since he's been using ketoconazole cream     Relevant past medical, surgical, family and social history reviewed and updated as indicated. Interim medical history since our last visit reviewed. Allergies and medications reviewed and updated. Outpatient Medications Prior to Visit  Medication Sig Dispense Refill   acetaminophen (TYLENOL) 650 MG CR tablet Take 1,300 mg by mouth every 8 (eight) hours as needed for pain.     amLODipine (NORVASC) 2.5 MG tablet Take 1 tablet (2.5 mg total) by mouth 2 (two) times daily. 180 tablet 3   aspirin EC 81 MG tablet Take 81 mg by mouth daily. Swallow whole.     benazepril (LOTENSIN) 20 MG tablet TAKE 1 TABLET BY MOUTH TWICE A DAY 180 tablet 2   Cyanocobalamin (B-12) 1000 MCG SUBL Place 1 tablet under the tongue every other day. 30 each    diclofenac Sodium (VOLTAREN) 1 % GEL Apply 1 application topically 2 (two) times daily as needed (pain).     docusate sodium (COLACE) 100 MG capsule Take 1 capsule (100 mg total) by mouth 2 (two) times daily. (Patient taking differently: Take 100 mg by mouth 2 (two) times daily as needed for moderate constipation.) 180 capsule 3   famotidine (PEPCID) 20 MG tablet TAKE 1 TABLET(20 MG) BY MOUTH TWICE DAILY (Patient taking differently: Take 20 mg by mouth at bedtime.) 180 tablet 0   hydrochlorothiazide (HYDRODIURIL) 25 MG tablet TAKE 1 TABLET BY MOUTH ONCE A DAY AS NEEDED FOR SWELLING (Patient  taking differently: Take 25 mg by mouth daily. TAKE 1 TABLET BY MOUTH ONCE A DAY AS NEEDED FOR SWELLING) 90 tablet 1   ketoconazole (NIZORAL) 2 % cream Apply 1 application topically daily as needed for irritation.     loratadine (CLARITIN) 10 MG tablet Take 10 mg by mouth daily.     naproxen sodium (ALEVE) 220 MG tablet Take 440 mg by mouth every morning.     pantoprazole (PROTONIX) 40 MG tablet Take 1 tablet (40 mg total) by mouth 2 (two) times daily. (Patient taking differently:  Take 40 mg by mouth every morning.) 180 tablet 2   polyethylene glycol powder (GLYCOLAX/MIRALAX) 17 GM/SCOOP powder TAKE 17 GRAMS BY MOUTH TWICE DAILY (Patient taking differently: Take 17 g by mouth at bedtime.) 510 g 2   potassium chloride (KLOR-CON) 10 MEQ tablet TAKE 1 TABLET BY MOUTH ONCE A DAY 90 tablet 3   sildenafil (REVATIO) 20 MG tablet Take 2 to 5 tablets as needed 50 tablet 3   simvastatin (ZOCOR) 10 MG tablet TAKE 1 TABLET BY MOUTH ONCE A DAY AT SIXIN THE EVENING 90 tablet 3   tamsulosin (FLOMAX) 0.4 MG CAPS capsule TAKE 1 CAPSULE BY MOUTH ONCE DAILY AFTERSUPPER 90 capsule 3   No facility-administered medications prior to visit.     Per HPI unless specifically indicated in ROS section below Review of Systems  Objective:  BP 132/68    Pulse 78    Temp 97.7 F (36.5 C) (Temporal)    Ht 5\' 8"  (1.727 m)    Wt 178 lb 1 oz (80.8 kg)    SpO2 97%    BMI 27.07 kg/m   Wt Readings from Last 3 Encounters:  11/11/21 178 lb 1 oz (80.8 kg)  09/21/21 179 lb 12.8 oz (81.6 kg)  04/11/21 177 lb (80.3 kg)      Physical Exam Vitals and nursing note reviewed.  Constitutional:      Appearance: Normal appearance. He is not ill-appearing.  Cardiovascular:     Rate and Rhythm: Normal rate and regular rhythm.     Pulses: Normal pulses.     Heart sounds: Normal heart sounds. No murmur heard. Pulmonary:     Effort: Pulmonary effort is normal. No respiratory distress.     Breath sounds: Normal breath sounds. No wheezing, rhonchi or rales.  Musculoskeletal:        General: Normal range of motion.     Right lower leg: No edema.     Left lower leg: No edema.  Skin:    General: Skin is warm and dry.     Findings: Rash present.     Comments: Macular rash under arms bilaterally, few macules L inner arm, overall postinflammatory changes, no clear scaling  Neurological:     Mental Status: He is alert.  Psychiatric:        Mood and Affect: Mood normal.        Behavior: Behavior normal.       Results for orders placed or performed in visit on 11/11/21  POCT Urinalysis Dipstick (Automated)  Result Value Ref Range   Color, UA light yellow    Clarity, UA clear    Glucose, UA Negative Negative   Bilirubin, UA negative    Ketones, UA negative    Spec Grav, UA 1.015 1.010 - 1.025   Blood, UA negative    pH, UA 5.5 5.0 - 8.0   Protein, UA Negative Negative   Urobilinogen, UA 0.2 0.2 or 1.0  E.U./dL   Nitrite, UA negative    Leukocytes, UA Negative Negative  POCT glycosylated hemoglobin (Hb A1C)  Result Value Ref Range   Hemoglobin A1C 5.6 4.0 - 5.6 %   HbA1c POC (<> result, manual entry)     HbA1c, POC (prediabetic range)     HbA1c, POC (controlled diabetic range)     DG Chest 2 View CLINICAL DATA:  Preoperative evaluation  EXAM: CHEST - 2 VIEW  COMPARISON:  11/02/2020  FINDINGS: Cardiac silhouette and mediastinal contours are unchanged and within normal limits with calcification again seen within the aortic arch and mildly tortuous descending thoracic aorta.  The lungs are clear. Moderate hyperinflation. No pleural effusion or pneumothorax.  Mild-to-moderate multilevel degenerative disc changes of the thoracic spine.  IMPRESSION: No active cardiopulmonary disease.  Electronically Signed   By: Yvonne Kendall M.D.   On: 11/11/2021 17:32  Assessment & Plan:  This visit occurred during the SARS-CoV-2 public health emergency.  Safety protocols were in place, including screening questions prior to the visit, additional usage of staff PPE, and extensive cleaning of exam room while observing appropriate contact time as indicated for disinfecting solutions.   Problem List Items Addressed This Visit     Essential hypertension    Chronic, stable on current regimen.       Lumbar stenosis with neurogenic claudication    Now with concerning foot drop - has seen neurosurgery but he's declined recommended surgical intervention.       Chronic constipation    He  continues miralax once daily and colace twice daily.  Reviewed importance of regular bowel regimen to prevent constipation, especially in setting of upcoming surgery.       Pre-op evaluation - Primary    RCRI = 0 He has already received cardiac clearance.  Will update CXR, POC A1c and urinalysis today. He has preop testing scheduled for Monday at [facility], with planned labwork (CBC, CMP, INR). Will not draw blood work today in anticipation of upcoming labs.  Assuming stable readings, anticipate adequately low risk to proceed with planned surgical intervention.       Relevant Orders   POCT Urinalysis Dipstick (Automated) (Completed)   DG Chest 2 View (Completed)   History of urinary retention    H/o this after spine surgery 2015.  At that time was regularly taking benadryl.  Did significantly better after latest hip replacement 2022 without urinary issues.       Osteoarthritis of left knee   Skin rash    Anticipate improving tinea on topical ketoconazole.       Other Visit Diagnoses     Hyperglycemia       Relevant Orders   POCT glycosylated hemoglobin (Hb A1C) (Completed)        No orders of the defined types were placed in this encounter.  Orders Placed This Encounter  Procedures   DG Chest 2 View    Standing Status:   Future    Number of Occurrences:   1    Standing Expiration Date:   11/11/2022    Order Specific Question:   Reason for Exam (SYMPTOM  OR DIAGNOSIS REQUIRED)    Answer:   preop eval    Order Specific Question:   Preferred imaging location?    Answer:   [facility]   POCT Urinalysis Dipstick (Automated)   POCT glycosylated hemoglobin (Hb A1C)     Patient Instructions  A1c and urinalysis today Chest xray today We will forward  results to Dr Aluisio's office.  Good to see you today I hope you have a speedy recovery!   Follow up plan: Return if symptoms worsen or fail to improve.  [name], MD

## 2021-11-11 NOTE — Assessment & Plan Note (Signed)
He continues miralax once daily and colace twice daily.  Reviewed importance of regular bowel regimen to prevent constipation, especially in setting of upcoming surgery.

## 2021-11-11 NOTE — Patient Instructions (Addendum)
A1c and urinalysis today Chest xray today We will forward results to Dr Aluisio's office.  Good to see you today I hope you have a speedy recovery!

## 2021-11-11 NOTE — Progress Notes (Addendum)
COVID swab appointment: 11/17/21 @ 0930  COVID Vaccine Completed: yes x3 Date COVID Vaccine completed: 11/05/19, 12/03/19 Has received booster: 10/19/20 COVID vaccine manufacturer: Moderna    Date of COVID positive in last 90 days: no  PCP - [name], MD Cardiologist - [name], MD   Cardiac clearance by Coletta Memos 08/11/21 in [place] clearance 11/11/21 by [name] in [place].  Chest x-ray - 11/11/21 Epic  EKG - 09/21/21 Epic Stress Test - 10+ years per pt ECHO - 2008 Cardiac Cath - n/a Pacemaker/ICD device last checked: n/a Spinal Cord Stimulator: n/a  Sleep Study - n/a CPAP -   Fasting Blood Sugar - n/a Checks Blood Sugar _____ times a day  Blood Thinner Instructions: Aspirin Instructions: AS 81, hold 3 days Last Dose:   Activity level: Can go up a flight of stairs and perform activities of daily living without stopping and without symptoms of chest pain or shortness of breath.    Anesthesia review: CAD, anemia, HTN  Patient denies shortness of breath, fever, cough and chest pain at PAT appointment   Patient verbalized understanding of instructions that were given to them at the PAT appointment. Patient was also instructed that they will need to review over the PAT instructions again at home before surgery.

## 2021-11-11 NOTE — Assessment & Plan Note (Signed)
Anticipate improving tinea on topical ketoconazole.

## 2021-11-11 NOTE — Assessment & Plan Note (Addendum)
RCRI = 0 He has already received cardiac clearance.  Will update CXR, POC A1c and urinalysis today. He has preop testing scheduled for Monday at [facility], with planned labwork (CBC, CMP, INR). Will not draw blood work today in anticipation of upcoming labs.  Assuming stable readings, anticipate adequately low risk to proceed with planned surgical intervention.

## 2021-11-11 NOTE — Assessment & Plan Note (Signed)
Chronic, stable on current regimen.  

## 2021-11-11 NOTE — Patient Instructions (Addendum)
DUE TO COVID-19 ONLY ONE VISITOR IS ALLOWED TO COME WITH YOU AND STAY IN THE WAITING ROOM ONLY DURING PRE OP AND PROCEDURE.   **NO VISITORS ARE ALLOWED IN THE SHORT STAY AREA OR RECOVERY ROOM!!**  IF YOU WILL BE ADMITTED INTO THE HOSPITAL YOU ARE ALLOWED ONLY TWO SUPPORT PEOPLE DURING VISITATION HOURS ONLY (7 AM -8PM)   The support person(s) must pass our screening, gel in and out, and wear a mask at all times, including in the patients room. Patients must also wear a mask when staff or their support person are in the room. Visitors GUEST BADGE MUST BE WORN VISIBLY  One adult visitor may remain with you overnight and MUST be in the room by 8 P.M.  No visitors under the age of 57. Any visitor under the age of 19 must be accompanied by an adult.    COVID SWAB TESTING MUST BE COMPLETED ON:  11/17/21 @ 9:30 AM   Site: [facility] [place] Enter: Main Entrance have a seat in the waiting area to the right of main entrance (DO NOT [place]!!!!!) Dial: [phone] to alert staff you have arrived  You are not required to quarantine, however you are required to wear a well-fitted mask when you are out and around people not in your household.  Hand Hygiene often Do NOT share personal items Notify your provider if you are in close contact with someone who has COVID or you develop fever 100.4 or greater, new onset of sneezing, cough, sore throat, shortness of breath or body aches.       Your procedure is scheduled on: 11/21/21   Report to [facility] Main Entrance    Report to admitting at 8:45 AM   Call this number if you have problems the morning of surgery [phone]   Do not eat food :After Midnight.   May have liquids until 8:30 AM day of surgery  CLEAR LIQUID DIET  Foods Allowed                                                                     Foods Excluded  Water, Black Coffee and tea (no milk or creamer)           liquids  that you cannot  Plain Jell-O in any flavor  (No red)                                    see through such as: Fruit ices (not with fruit pulp)                                            milk, soups, orange juice              Iced Popsicles (No red)  All solid food                                   Apple juices Sports drinks like Gatorade (No red) Lightly seasoned clear broth or consume(fat free) Sugar    The day of surgery:  Drink ONE (1) Pre-Surgery Clear Ensure at 8:30 AM the morning of surgery. Drink in one sitting. Do not sip.  This drink was given to you during your hospital  pre-op appointment visit. Nothing else to drink after completing the  Pre-Surgery Clear Ensure or G2.          If you have questions, please contact your surgeons office.     Oral Hygiene is also important to reduce your risk of infection.                                    Remember - BRUSH YOUR TEETH THE MORNING OF SURGERY WITH YOUR REGULAR TOOTHPASTE   Stop all vitamins and supplements 5 days before surgery.   Take these medicines the morning of surgery with A SIP OF WATER: Tylenol, Amlodipine, Claritin, Protonix                              You may not have any metal on your body including jewelry, and body piercing             Do not wear lotions, powders, cologne, or deodorant              Men may shave face and neck.   Do not bring valuables to the hospital. [place].   Contacts, dentures or bridgework may not be worn into surgery.   Bring small overnight bag day of surgery.   Special Instructions: Bring a copy of your healthcare power of attorney and living will documents         the day of surgery if you haven't scanned them before.              Please read over the following fact sheets you were given: IF YOU HAVE QUESTIONS ABOUT YOUR PRE-OP INSTRUCTIONS PLEASE CALL [facility] - Preparing for Surgery Before surgery, you can play an important role.  Because skin is not sterile, your skin needs to be as free of germs as possible.  You can reduce the number of germs on your skin by washing with CHG (chlorahexidine gluconate) soap before surgery.  CHG is an antiseptic cleaner which kills germs and bonds with the skin to continue killing germs even after washing. Please DO NOT use if you have an allergy to CHG or antibacterial soaps.  If your skin becomes reddened/irritated stop using the CHG and inform your nurse when you arrive at Short Stay. Do not shave (including legs and underarms) for at least 48 hours prior to the first CHG shower.  You may shave your face/neck.  Please follow these instructions carefully:  1.  Shower with CHG Soap the night before surgery and the  morning of surgery.  2.  If you choose to wash your hair, wash your hair first as usual with your normal  shampoo.  3.  After you shampoo, rinse your hair and body thoroughly to remove the shampoo.                             4.  Use CHG as you would any other liquid soap.  You can apply chg directly to the skin and wash.  Gently with a scrungie or clean washcloth.  5.  Apply the CHG Soap to your body ONLY FROM THE NECK DOWN.   Do   not use on face/ open                           Wound or open sores. Avoid contact with eyes, ears mouth and   genitals (private parts).                       Wash face,  Genitals (private parts) with your normal soap.             6.  Wash thoroughly, paying special attention to the area where your    surgery  will be performed.  7.  Thoroughly rinse your body with warm water from the neck down.  8.  DO NOT shower/wash with your normal soap after using and rinsing off the CHG Soap.                9.  Pat yourself dry with a clean towel.            10.  Wear clean pajamas.            11.  Place clean sheets on your bed the night of your first shower and do not  sleep  with pets. Day of Surgery : Do not apply any lotions/deodorants the morning of surgery.  Please wear clean clothes to the hospital/surgery center.  FAILURE TO FOLLOW THESE INSTRUCTIONS MAY RESULT IN THE CANCELLATION OF YOUR SURGERY  PATIENT SIGNATURE_________________________________  NURSE SIGNATURE__________________________________  ________________________________________________________________________   Adam Phenix  An incentive spirometer is a tool that can help keep your lungs clear and active. This tool measures how well you are filling your lungs with each breath. Taking long deep breaths may help reverse or decrease the chance of developing breathing (pulmonary) problems (especially infection) following: A long period of time when you are unable to move or be active. BEFORE THE PROCEDURE  If the spirometer includes an indicator to show your best effort, your nurse or respiratory therapist will set it to a desired goal. If possible, sit up straight or lean slightly forward. Try not to slouch. Hold the incentive spirometer in an upright position. INSTRUCTIONS FOR USE  Sit on the edge of your bed if possible, or sit up as far as you can in bed or on a chair. Hold the incentive spirometer in an upright position. Breathe out normally. Place the mouthpiece in your mouth and seal your lips tightly around it. Breathe in slowly and as deeply as possible, raising the piston or the ball toward the top of the column. Hold your breath for 3-5 seconds or for as long as possible. Allow the piston or ball to fall to the bottom of the column. Remove the mouthpiece from your mouth and breathe out normally. Rest for a few seconds and repeat Steps 1 through 7 at least 10 times every 1-2 hours when you are awake. Take your time and take  a few normal breaths between deep breaths. The spirometer may include an indicator to show your best effort. Use the indicator as a goal to work toward  during each repetition. After each set of 10 deep breaths, practice coughing to be sure your lungs are clear. If you have an incision (the cut made at the time of surgery), support your incision when coughing by placing a pillow or rolled up towels firmly against it. Once you are able to get out of bed, walk around indoors and cough well. You may stop using the incentive spirometer when instructed by your caregiver.  RISKS AND COMPLICATIONS Take your time so you do not get dizzy or light-headed. If you are in pain, you may need to take or ask for pain medication before doing incentive spirometry. It is harder to take a deep breath if you are having pain. AFTER USE Rest and breathe slowly and easily. It can be helpful to keep track of a log of your progress. Your caregiver can provide you with a simple table to help with this. If you are using the spirometer at home, follow these instructions: [place] IF:  You are having difficultly using the spirometer. You have trouble using the spirometer as often as instructed. Your pain medication is not giving enough relief while using the spirometer. You develop fever of 100.5 F (38.1 C) or higher. SEEK IMMEDIATE MEDICAL CARE IF:  You cough up bloody sputum that had not been present before. You develop fever of 102 F (38.9 C) or greater. You develop worsening pain at or near the incision site. MAKE SURE YOU:  Understand these instructions. Will watch your condition. Will get help right away if you are not doing well or get worse. Document Released: 02/19/2007 Document Revised: 01/01/2012 Document Reviewed: 04/22/2007 Exit[employer] Patient Information 2014 Exit[employer], [place].   ________________________________________________________________________  WHAT IS A BLOOD TRANSFUSION? Blood Transfusion Information  A transfusion is the replacement of blood or some of its parts. Blood is made up of multiple cells which provide different  functions. Red blood cells carry oxygen and are used for blood loss replacement. White blood cells fight against infection. Platelets control bleeding. Plasma helps clot blood. Other blood products are available for specialized needs, such as hemophilia or other clotting disorders. BEFORE THE TRANSFUSION  Who gives blood for transfusions?  Healthy volunteers who are fully evaluated to make sure their blood is safe. This is blood bank blood. Transfusion therapy is the safest it has ever been in the practice of medicine. Before blood is taken from a donor, a complete history is taken to make sure that person has no history of diseases nor engages in risky social behavior (examples are intravenous drug use or sexual activity with multiple partners). The donor's travel history is screened to minimize risk of transmitting infections, such as malaria. The donated blood is tested for signs of infectious diseases, such as HIV and hepatitis. The blood is then tested to be sure it is compatible with you in order to minimize the chance of a transfusion reaction. If you or a relative donates blood, this is often done in anticipation of surgery and is not appropriate for emergency situations. It takes many days to process the donated blood. RISKS AND COMPLICATIONS Although transfusion therapy is very safe and saves many lives, the main dangers of transfusion include:  Getting an infectious disease. Developing a transfusion reaction. This is an allergic reaction to something in the blood you were given. Every  precaution is taken to prevent this. The decision to have a blood transfusion has been considered carefully by your caregiver before blood is given. Blood is not given unless the benefits outweigh the risks. AFTER THE TRANSFUSION Right after receiving a blood transfusion, you will usually feel much better and more energetic. This is especially true if your red blood cells have gotten low (anemic). The  transfusion raises the level of the red blood cells which carry oxygen, and this usually causes an energy increase. The nurse administering the transfusion will monitor you carefully for complications. HOME CARE INSTRUCTIONS  No special instructions are needed after a transfusion. You may find your energy is better. Speak with your caregiver about any limitations on activity for underlying diseases you may have. SEEK MEDICAL CARE IF:  Your condition is not improving after your transfusion. You develop redness or irritation at the intravenous (IV) site. SEEK IMMEDIATE MEDICAL CARE IF:  Any of the following symptoms occur over the next 12 hours: Shaking chills. You have a temperature by mouth above 102 F (38.9 C), not controlled by medicine. Chest, back, or muscle pain. People around you feel you are not acting correctly or are confused. Shortness of breath or difficulty breathing. Dizziness and fainting. You get a rash or develop hives. You have a decrease in urine output. Your urine turns a dark color or changes to pink, red, or brown. Any of the following symptoms occur over the next 10 days: You have a temperature by mouth above 102 F (38.9 C), not controlled by medicine. Shortness of breath. Weakness after normal activity. The white part of the eye turns yellow (jaundice). You have a decrease in the amount of urine or are urinating less often. Your urine turns a dark color or changes to pink, red, or brown. Document Released: 10/06/2000 Document Revised: 01/01/2012 Document Reviewed: 05/25/2008 [facility] Patient Information 2014 [place], [place].  _______________________________________________________________________

## 2021-11-11 NOTE — Assessment & Plan Note (Addendum)
Now with concerning foot drop - has seen neurosurgery but he's declined recommended surgical intervention.

## 2021-11-11 NOTE — Assessment & Plan Note (Signed)
H/o this after spine surgery 2015.  At that time was regularly taking benadryl.  Did significantly better after latest hip replacement 2022 without urinary issues.

## 2021-11-14 ENCOUNTER — Encounter (HOSPITAL_COMMUNITY): Payer: Self-pay

## 2021-11-14 ENCOUNTER — Other Ambulatory Visit: Payer: Self-pay

## 2021-11-14 ENCOUNTER — Encounter (HOSPITAL_COMMUNITY)
Admission: RE | Admit: 2021-11-14 | Discharge: 2021-11-14 | Disposition: A | Discharge status: Home / Self Care | Payer: Medicare Other | Source: Ambulatory Visit | Attending: Orthopedic Surgery | Admitting: Orthopedic Surgery

## 2021-11-14 ENCOUNTER — Other Ambulatory Visit: Payer: Self-pay | Admitting: Gastroenterology

## 2021-11-14 VITALS — BP 170/79 | HR 65 | Temp 97.9°F | Resp 18 | Ht 67.5 in | Wt 173.6 lb

## 2021-11-14 DIAGNOSIS — I10 Essential (primary) hypertension: Secondary | ICD-10-CM | POA: Insufficient documentation

## 2021-11-14 DIAGNOSIS — K219 Gastro-esophageal reflux disease without esophagitis: Secondary | ICD-10-CM | POA: Diagnosis not present

## 2021-11-14 DIAGNOSIS — Z01818 Encounter for other preprocedural examination: Secondary | ICD-10-CM

## 2021-11-14 DIAGNOSIS — Z01812 Encounter for preprocedural laboratory examination: Secondary | ICD-10-CM | POA: Diagnosis not present

## 2021-11-14 DIAGNOSIS — M1712 Unilateral primary osteoarthritis, left knee: Secondary | ICD-10-CM | POA: Diagnosis not present

## 2021-11-14 DIAGNOSIS — Z87891 Personal history of nicotine dependence: Secondary | ICD-10-CM | POA: Insufficient documentation

## 2021-11-14 LAB — COMPREHENSIVE METABOLIC PANEL
ALT: 19 U/L (ref 0–44)
AST: 15 U/L (ref 15–41)
Albumin: 4.5 g/dL (ref 3.5–5.0)
Alkaline Phosphatase: 50 U/L (ref 38–126)
Anion gap: 6 (ref 5–15)
BUN: 33 mg/dL — ABNORMAL HIGH (ref 8–23)
CO2: 28 mmol/L (ref 22–32)
Calcium: 9.4 mg/dL (ref 8.9–10.3)
Chloride: 106 mmol/L (ref 98–111)
Creatinine, Ser: 0.88 mg/dL (ref 0.61–1.24)
GFR, Estimated: >60 mL/min (ref >60)
Glucose, Bld: 95 mg/dL (ref 70–99)
Potassium: 4.2 mmol/L (ref 3.5–5.1)
Sodium: 140 mmol/L (ref 135–145)
Total Bilirubin: 0.7 mg/dL (ref 0.3–1.2)
Total Protein: 7.5 g/dL (ref 6.5–8.1)

## 2021-11-14 LAB — CBC
HCT: 46.8 % (ref 39.0–52.0)
Hemoglobin: 15.1 g/dL (ref 13.0–17.0)
MCH: 30.4 pg (ref 26.0–34.0)
MCHC: 32.3 g/dL (ref 30.0–36.0)
MCV: 94.4 fL (ref 80.0–100.0)
Platelets: 240 10*3/uL (ref 150–400)
RBC: 4.96 MIL/uL (ref 4.22–5.81)
RDW: 13.5 % (ref 11.5–15.5)
WBC: 7.7 10*3/uL (ref 4.0–10.5)
nRBC: 0 % (ref 0.0–0.2)

## 2021-11-14 LAB — SURGICAL PCR SCREEN
MRSA, PCR: NEGATIVE
Staphylococcus aureus: NEGATIVE

## 2021-11-14 LAB — PROTIME-INR
INR: 1 (ref 0.8–1.2)
Prothrombin Time: 13 seconds (ref 11.4–15.2)

## 2021-11-15 NOTE — Progress Notes (Signed)
Anesthesia Chart Review   Case: [id number] Date/Time: 11/21/21 1119   Procedure: TOTAL KNEE ARTHROPLASTY (Left: Knee)   Anesthesia type: Choice   Pre-op diagnosis: left knee osteoarthritis   Location: [facility] ROOM 09 / [facility]   Surgeons: [name], MD       DISCUSSION:80 y.o. former smoker with h/o HTN, GERD, BPH, left knee OA scheduled for above procedure 11/21/2021 with Dr. [name].   Pt seen by cardiology 09/21/2021 for preoperative evaluation. Per OV note, "Patient is at low risk for knee or spine surgery. He feels the knee needs to be done first due to his pain but I am concerned he has neuropathic involvement from his spine. This will need to be worked out between [name] and Dr Arnoldo Morale."  Anticipate pt can proceed with planned procedure barring acute status change.   VS: BP (!) 170/79    Pulse 65    Temp 36.6 C (Oral)    Resp 18    Ht 5' 7.5" (1.715 m)    Wt 78.7 kg    SpO2 100%    BMI 26.79 kg/m   PROVIDERS: [name], MD is PCP   [place], Peter, MD is Cardiologist  LABS: Labs reviewed: Acceptable for surgery. (all labs ordered are listed, but only abnormal results are displayed)  Labs Reviewed  COMPREHENSIVE METABOLIC PANEL - Abnormal; Notable for the following components:      Result Value   BUN 33 (*)    All other components within normal limits  SURGICAL PCR SCREEN  CBC  PROTIME-INR  TYPE AND SCREEN     IMAGES:   EKG: 09/21/2021 Rate 74 bpm  NSR Nonspecific ST and T wave abnormality   CV:  Past Medical History:  Diagnosis Date   Abnormal echocardiogram Jan 2012   grade 1 diastolic dysfunction, normal EF, mild LVH   Anemia    Basal cell carcinoma 04/08/2019   left nose inferior to nasal alar groove. EDC: 05/20/2019   Bell palsy 10/2010   presented with facial numbness   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    Degenerative disk disease    GERD (gastroesophageal reflux disease)    History of kidney stones     Hyperlipidemia    Hypertension    dr [name]   Kidney cysts    Lumbar stenosis    Nephrolithiasis    Normal nuclear stress test 2008   Osteoarthritis    Skin cancer    Vocal cord leukoplakia    Dr Erik Obey    Past Surgical History:  Procedure Laterality Date   COLONOSCOPY  04/2019   TAx2, hemorrhoids, rpt 54yrs (Adrian Davis)   ESOPHAGOGASTRODUODENOSCOPY  04/2019   erosive gastropathy, biopsy negative for barrett's (Adrian Davis)   KNEE ARTHROSCOPY Right    LUMBAR LAMINECTOMY/DECOMPRESSION MICRODISCECTOMY N/A 11/12/2013   Adrian Charter, MD - complicated by urinary retention   MULTIPLE TOOTH EXTRACTIONS     SHOULDER SURGERY Bilateral    TONSILLECTOMY     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   Procedure: TOTAL HIP ARTHROPLASTY ANTERIOR APPROACH;  Surgeon: [name], MD;  Location: [facility];  Service: Orthopedics;  Laterality: Right;  140min    MEDICATIONS:  acetaminophen (TYLENOL) 650 MG CR tablet   amLODipine (NORVASC) 2.5 MG tablet   aspirin EC 81 MG tablet   benazepril (LOTENSIN) 20 MG tablet   Cyanocobalamin (B-12) 1000 MCG SUBL   diclofenac Sodium (VOLTAREN) 1 % GEL   docusate sodium (COLACE) 100 MG capsule  famotidine (PEPCID) 20 MG tablet   hydrochlorothiazide (HYDRODIURIL) 25 MG tablet   ketoconazole (NIZORAL) 2 % cream   loratadine (CLARITIN) 10 MG tablet   naproxen sodium (ALEVE) 220 MG tablet   pantoprazole (PROTONIX) 40 MG tablet   polyethylene glycol powder (GLYCOLAX/MIRALAX) 17 GM/SCOOP powder   potassium chloride (KLOR-CON) 10 MEQ tablet   sildenafil (REVATIO) 20 MG tablet   simvastatin (ZOCOR) 10 MG tablet   tamsulosin (FLOMAX) 0.4 MG CAPS capsule   No current facility-administered medications for this encounter.    Konrad Felix Ward, PA-C [facility] Pre-Surgical Testing [phone]

## 2021-11-16 NOTE — H&P (Signed)
TOTAL KNEE ADMISSION H&P  Patient is being admitted for left total knee arthroplasty.  Subjective:  Chief Complaint: Left knee pain.  HPI: Adrian Davis, 81 y.o. male has a history of pain and functional disability in the left knee due to arthritis and has failed non-surgical conservative treatments for greater than 12 weeks to include NSAID's and/or analgesics, flexibility and strengthening excercises, and activity modification. Onset of symptoms was gradual, starting  several  years ago with gradually worsening course since that time. The patient noted no past surgery on the left knee.  Patient currently rates pain in the left knee at 7 out of 10 with activity. Patient has worsening of pain with activity and weight bearing, pain that interferes with activities of daily living, pain with passive range of motion, and joint swelling. Patient has evidence of periarticular osteophytes, joint subluxation, and joint space narrowing by imaging studies. There is no active infection.  Patient Active Problem List   Diagnosis Date Noted   Skin rash 11/11/2021   Osteoarthritis of left knee 10/11/2021   DDD (degenerative disc disease), lumbar 10/04/2021   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   Fatigue 03/14/2021   Primary osteoarthritis of right hip 11/10/2020   Pre-op evaluation 11/04/2020   Smokeless tobacco use 11/04/2020   History of urinary retention 11/04/2020   Chronic constipation 01/21/2020   Nephrolithiasis 11/14/2018   Acquired complex renal cyst 11/14/2018   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   Lower abdominal pain 07/24/2018   Unintentional weight loss 07/24/2018   Vitamin B12 deficiency 01/21/2018   Acute sinusitis 04/13/2017   Actinic keratosis 01/16/2017   Medicare annual wellness visit, initial 01/03/2017   Counseling about travel 01/03/2017   Advanced care planning/counseling discussion 01/03/2017   Changing skin lesion 12/25/2016   Bilateral impacted cerumen 07/25/2016   Vocal  cord leukoplakia    Overweight (BMI 25.0-29.9) 02/22/2016   Paresthesias 02/22/2016   GERD (gastroesophageal reflux disease)    OA (osteoarthritis) of hip    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 EDC: 05/20/2019   Bell palsy 10/2010   presented with facial numbness   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    Degenerative disk disease    GERD (gastroesophageal reflux disease)    History of kidney stones    Hyperlipidemia    Hypertension    dr [name]   Kidney cysts    Lumbar stenosis    Nephrolithiasis    Normal nuclear stress test 2008   Osteoarthritis    Skin cancer    Vocal cord leukoplakia    Dr Erik Obey    Past Surgical History:  Procedure Laterality Date   COLONOSCOPY  04/2019   TAx2, hemorrhoids, rpt 26yrs (Mansouraty)   ESOPHAGOGASTRODUODENOSCOPY  04/2019   erosive gastropathy, biopsy negative for barrett's (Mansouraty)   KNEE ARTHROSCOPY Right    LUMBAR LAMINECTOMY/DECOMPRESSION MICRODISCECTOMY N/A 11/12/2013   Ophelia Charter, MD - complicated by urinary retention   MULTIPLE TOOTH EXTRACTIONS     SHOULDER SURGERY Bilateral    TONSILLECTOMY     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  Procedure: TOTAL HIP ARTHROPLASTY ANTERIOR APPROACH;  Surgeon: [name], MD;  Location: [facility];  Service: Orthopedics;  Laterality: Right;  157min    Prior to Admission medications   Medication Sig Start Date End Date Taking? Authorizing Provider  acetaminophen (TYLENOL) 650 MG CR tablet Take 1,300 mg by mouth every 8 (eight) hours as needed for pain.   Yes [provider]  amLODipine (NORVASC) 2.5 MG tablet Take 1 tablet (2.5 mg total) by mouth 2 (two) times daily. 09/21/21 09/16/22 Yes [place], Peter M, MD  aspirin EC 81 MG tablet Take 81 mg by mouth daily. Swallow whole.   Yes [provider]  benazepril (LOTENSIN) 20 MG tablet TAKE 1 TABLET BY MOUTH TWICE A DAY 10/26/21  Yes [place], Peter M, MD  Cyanocobalamin (B-12) 1000 MCG SUBL Place 1 tablet under the tongue every other day. 07/26/18  Yes [name], MD  diclofenac Sodium (VOLTAREN) 1 % GEL Apply 1 application topically 2 (two) times daily as needed (pain).   Yes [provider]  docusate sodium (COLACE) 100 MG capsule Take 1 capsule (100 mg total) by mouth 2 (two) times daily. Patient taking differently: Take 100 mg by mouth 2 (two) times daily as needed for moderate constipation. 06/10/20  Yes [place], Peter M, MD  famotidine (PEPCID) 20 MG tablet TAKE 1 TABLET(20 MG) BY MOUTH TWICE DAILY Patient taking differently: Take 20 mg by mouth at bedtime. 09/30/21  Yes [name], MD  hydrochlorothiazide (HYDRODIURIL) 25 MG tablet TAKE 1 TABLET BY MOUTH ONCE A DAY AS NEEDED FOR SWELLING Patient taking differently: Take 25 mg by mouth daily. TAKE 1 TABLET BY MOUTH ONCE A DAY AS NEEDED FOR SWELLING 07/27/21  Yes [place], Peter M, MD  ketoconazole (NIZORAL) 2 % cream Apply 1 application topically daily as needed for irritation.   Yes [provider]  loratadine (CLARITIN) 10 MG tablet Take 10 mg by mouth daily.   Yes [provider]  naproxen sodium (ALEVE) 220 MG tablet Take 440 mg by mouth every morning.   Yes [provider]  pantoprazole (PROTONIX) 40 MG tablet Take 1 tablet (40 mg total) by mouth 2 (two) times daily. Patient taking differently: Take 40 mg by mouth every morning. 01/03/21  Yes [place], Peter M, MD  polyethylene glycol powder (GLYCOLAX/MIRALAX) 17 GM/SCOOP powder TAKE 17 GRAMS BY MOUTH TWICE DAILY Patient taking differently: Take 17 g  by mouth at bedtime. 09/28/20  Yes [place]our[place], Telford Nab., MD  potassium chloride (KLOR-CON) 10 MEQ tablet TAKE 1 TABLET BY MOUTH ONCE A DAY 04/01/21  Yes [place], Peter M, MD  simvastatin (ZOCOR) 10 MG tablet TAKE 1 TABLET BY MOUTH ONCE A DAY AT [facility] THE EVENING 02/14/21  Yes [place], Peter M, MD  tamsulosin (FLOMAX) 0.4 MG CAPS capsule TAKE 1 CAPSULE BY MOUTH ONCE DAILY AFTERSUPPER 02/14/21  Yes [place], Peter M, MD  sildenafil (REVATIO) 20 MG tablet Take 2 to 5 tablets as needed 05/17/20   [place], Peter M, MD    No Known Allergies  Social History   Socioeconomic History   Marital status: Married    Spouse name: Not on file   Number of children: 1   Years of education: Not on file   Highest education level: Not on file  Occupational History   Occupation: farmer  Tobacco Use   Smoking status: Former    Packs/day: 1.50    Years: 10.00    Pack years: 15.00    Types: Cigarettes  Quit date: 05/15/1976    Years since quitting: 45.5   Smokeless tobacco: Current    Types: Chew   Tobacco comments:    chews tobacco  Vaping Use   Vaping Use: Never used  Substance and Sexual Activity   Alcohol use: Yes    Comment: rare   Drug use: No   Sexual activity: Not on file  Other Topics Concern   Not on file  Social History Narrative   Lives with wife   Occ: farm   Edu: HS   Activity: active on farm   Diet: good water, fruits/vegetables daily   Social Determinants of Health   Financial Resource Strain: Not on file  Food Insecurity: Not on file  Transportation Needs: Not on file  Physical Activity: Not on file  Stress: Not on file  Social Connections: Not on file  Intimate Partner Violence: Not on file    Tobacco Use: High Risk   Smoking Tobacco Use: Former   Smokeless Tobacco Use: Current   Passive Exposure: Not on file   Social History   Substance and Sexual Activity  Alcohol Use Yes   Comment: rare    Family History  Problem Relation Age of Onset   Heart failure  Father 3   Hypertension Father    Arthritis Father    Anemia Mother        needed regular transfusions   Prostate cancer Son    Cancer Neg Hx    Diabetes Neg Hx    Colon cancer Neg Hx    Rectal cancer Neg Hx    Stomach cancer Neg Hx    Inflammatory bowel disease Neg Hx    Liver disease Neg Hx    Pancreatic cancer Neg Hx     ROS: Constitutional: no fever, no chills, no night sweats, no significant weight loss Cardiovascular: no chest pain, no palpitations Respiratory: no cough, no shortness of breath, No COPD Gastrointestinal: no vomiting, no nausea Musculoskeletal: no swelling in Joints, Joint Pain Neurologic: no numbness, no tingling, no difficulty with balance   Objective:  Physical Exam: Well nourished and well developed.  General: Alert and oriented x3, cooperative and pleasant, no acute distress.  Head: normocephalic, atraumatic, neck supple.  Eyes: EOMI.  Respiratory: breath sounds clear in all fields, no wheezing, rales, or rhonchi. Cardiovascular: Regular rate and rhythm, no murmurs, gallops or rubs.  Abdomen: non-tender to palpation and soft, normoactive bowel sounds. Musculoskeletal:   Right Hip Exam:   The range of motion: Flexion to 120 degrees, Internal Rotation to 30 degrees, External Rotation to 40 degrees, and abduction to 40 degrees without discomfort.   There is tenderness over the greater trochanteric bursa.   He has weakness with the ankle dorsiflexors. He is only 4/5 with dorsiflexion of the foot and ankle. EHL is only 4/5.     Left Knee Exam:   Trace effusion present. No swelling present. Varus deformity.   The range of motion is: 5 to 125 degrees.   Moderate crepitus on range of motion of the knee.   Positive medial greater than lateral joint line tenderness.   The knee is stable.   Calves soft and nontender. Motor function intact in LE. Strength 5/5 LE bilaterally. Neuro: Distal pulses 2+. Sensation to light touch intact in LE.  Vital  signs in last 24 hours:    Imaging Review Radiographs- AP and lateral of the bilateral knees dated earlier this year demonstrate tricompartmental bone-on-bone changes in the left knee with  tibial subluxation.    Assessment/Plan:  End stage arthritis, left knee   The patient history, physical examination, clinical judgment of the provider and imaging studies are consistent with end stage degenerative joint disease of the left knee and total knee arthroplasty is deemed medically necessary. The treatment options including medical management, injection therapy arthroscopy and arthroplasty were discussed at length. The risks and benefits of total knee arthroplasty were presented and reviewed. The risks due to aseptic loosening, infection, stiffness, patella tracking problems, thromboembolic complications and other imponderables were discussed. The patient acknowledged the explanation, agreed to proceed with the plan and consent was signed. Patient is being admitted for inpatient treatment for surgery, pain control, PT, OT, prophylactic antibiotics, VTE prophylaxis, progressive ambulation and ADLs and discharge planning. The patient is planning to be discharged  home .   Patient's anticipated LOS is less than 2 midnights, meeting these requirements: - Younger than 55 - Lives within 1 hour of care - Has a competent adult at home to recover with post-op recover - NO history of  - Chronic pain requiring opiods  - Diabetes  - Coronary Artery Disease  - Heart failure  - Heart attack  - Stroke  - DVT/VTE  - Cardiac arrhythmia  - Respiratory Failure/COPD  - Renal failure  - Anemia  - Advanced Liver disease    Therapy Plans: [facility] Disposition: Home with Wife Planned DVT Prophylaxis: Xarelto DME Needed: None PCP: Dr. Danise Mina (patient contacting for clearance) Cardiology: Clearance received TXA: IV Allergies: None Anesthesia Concerns: None BMI: 27.1 Last HgbA1c:  N/A  Pharmacy: [facility] [address]  - Patient was instructed on what medications to stop prior to surgery. - Follow-up visit in 2 weeks with Dr. Wynelle Link - Begin physical therapy following surgery - Pre-operative lab work as pre-surgical testing - Prescriptions will be provided in hospital at time of discharge  [name], [facility], PA-C Orthopedic Surgery [facility] [place]

## 2021-11-17 ENCOUNTER — Other Ambulatory Visit: Payer: Self-pay

## 2021-11-17 ENCOUNTER — Encounter (HOSPITAL_COMMUNITY)
Admission: RE | Admit: 2021-11-17 | Discharge: 2021-11-17 | Disposition: A | Discharge status: Home / Self Care | Payer: Medicare Other | Source: Ambulatory Visit | Attending: Orthopedic Surgery | Admitting: Orthopedic Surgery

## 2021-11-17 DIAGNOSIS — Z20822 Contact with and (suspected) exposure to covid-19: Secondary | ICD-10-CM | POA: Diagnosis not present

## 2021-11-17 DIAGNOSIS — Z01812 Encounter for preprocedural laboratory examination: Secondary | ICD-10-CM | POA: Insufficient documentation

## 2021-11-17 LAB — SARS CORONAVIRUS 2 (TAT 6-24 HRS): SARS Coronavirus 2: NEGATIVE

## 2021-11-18 ENCOUNTER — Other Ambulatory Visit: Payer: Self-pay | Admitting: Cardiology

## 2021-11-18 NOTE — Telephone Encounter (Signed)
Called an spoke with the patient to verify how he's taking his medication. I explained to him that the medication is prescribed AS NEEDED and he gave a verbal understanding but he did advise me that he takes his medication daily and everything has been doing fine.So I did update the instructions on his medication bottle "Order as reported" but I did NOT change Dr. Doug Sou instructions for the medication.

## 2021-11-21 ENCOUNTER — Encounter (HOSPITAL_COMMUNITY): Payer: Self-pay | Admitting: Orthopedic Surgery

## 2021-11-21 ENCOUNTER — Observation Stay (HOSPITAL_COMMUNITY)
Admission: RE | Admit: 2021-11-21 | Discharge: 2021-11-22 | Disposition: A | Discharge status: Home / Self Care | Payer: Medicare Other | Attending: Orthopedic Surgery | Admitting: Orthopedic Surgery

## 2021-11-21 ENCOUNTER — Ambulatory Visit (HOSPITAL_COMMUNITY): Payer: Medicare Other | Admitting: Physician Assistant

## 2021-11-21 ENCOUNTER — Ambulatory Visit (HOSPITAL_COMMUNITY): Payer: Medicare Other | Admitting: Anesthesiology

## 2021-11-21 ENCOUNTER — Other Ambulatory Visit: Payer: Self-pay

## 2021-11-21 ENCOUNTER — Encounter (HOSPITAL_COMMUNITY)
Admission: RE | Disposition: A | Discharge status: Home / Self Care | Payer: Self-pay | Source: Home / Self Care | Attending: Orthopedic Surgery

## 2021-11-21 DIAGNOSIS — M1712 Unilateral primary osteoarthritis, left knee: Secondary | ICD-10-CM | POA: Diagnosis not present

## 2021-11-21 DIAGNOSIS — Z87891 Personal history of nicotine dependence: Secondary | ICD-10-CM | POA: Insufficient documentation

## 2021-11-21 DIAGNOSIS — G8918 Other acute postprocedural pain: Secondary | ICD-10-CM | POA: Diagnosis not present

## 2021-11-21 DIAGNOSIS — I10 Essential (primary) hypertension: Secondary | ICD-10-CM | POA: Diagnosis not present

## 2021-11-21 DIAGNOSIS — M179 Osteoarthritis of knee, unspecified: Secondary | ICD-10-CM | POA: Diagnosis present

## 2021-11-21 DIAGNOSIS — Z79899 Other long term (current) drug therapy: Secondary | ICD-10-CM | POA: Insufficient documentation

## 2021-11-21 HISTORY — PX: TOTAL KNEE ARTHROPLASTY: SHX125

## 2021-11-21 LAB — TYPE AND SCREEN
ABO/RH(D): A POS
Antibody Screen: NEGATIVE

## 2021-11-21 SURGERY — ARTHROPLASTY, KNEE, TOTAL
Anesthesia: Spinal | Site: Knee | Laterality: Left

## 2021-11-21 MED ORDER — DOCUSATE SODIUM 100 MG PO CAPS
100.0000 mg | ORAL_CAPSULE | Freq: Two times a day (BID) | ORAL | Status: DC
  Administered 2021-11-21 – 2021-11-22 (×2): 100 mg via ORAL
  Filled 2021-11-21 (×2): qty 1

## 2021-11-21 MED ORDER — PHENYLEPHRINE 40 MCG/ML (10ML) SYRINGE FOR IV PUSH (FOR BLOOD PRESSURE SUPPORT)
PREFILLED_SYRINGE | INTRAVENOUS | Status: DC | PRN
  Administered 2021-11-21 (×2): 120 ug via INTRAVENOUS

## 2021-11-21 MED ORDER — FLEET ENEMA 7-19 GM/118ML RE ENEM: 1.0000 | ENEMA | Freq: Once | RECTAL | Status: DC | PRN

## 2021-11-21 MED ORDER — BUPIVACAINE-EPINEPHRINE (PF) 0.5% -1:200000 IJ SOLN
INTRAMUSCULAR | Status: DC | PRN
  Administered 2021-11-21: 20 mL via PERINEURAL

## 2021-11-21 MED ORDER — PROPOFOL 500 MG/50ML IV EMUL
INTRAVENOUS | Status: AC
  Filled 2021-11-21: qty 50

## 2021-11-21 MED ORDER — SODIUM CHLORIDE (PF) 0.9 % IJ SOLN
INTRAMUSCULAR | Status: AC
  Filled 2021-11-21: qty 10

## 2021-11-21 MED ORDER — PHENOL 1.4 % MT LIQD: 1.0000 | OROMUCOSAL | Status: DC | PRN

## 2021-11-21 MED ORDER — MENTHOL 3 MG MT LOZG: 1.0000 | LOZENGE | OROMUCOSAL | Status: DC | PRN

## 2021-11-21 MED ORDER — SODIUM CHLORIDE 0.9 % IR SOLN
Status: DC | PRN
  Administered 2021-11-21: 1000 mL

## 2021-11-21 MED ORDER — CHLORHEXIDINE GLUCONATE 0.12 % MT SOLN
15.0000 mL | Freq: Once | OROMUCOSAL | Status: AC
  Administered 2021-11-21: 15 mL via OROMUCOSAL

## 2021-11-21 MED ORDER — METHOCARBAMOL 1000 MG/10ML IJ SOLN
500.0000 mg | Freq: Four times a day (QID) | INTRAVENOUS | Status: DC | PRN
  Filled 2021-11-21: qty 5

## 2021-11-21 MED ORDER — POLYETHYLENE GLYCOL 3350 17 G PO PACK: 17.0000 g | PACK | Freq: Every day | ORAL | Status: DC | PRN

## 2021-11-21 MED ORDER — OXYCODONE HCL 5 MG PO TABS
5.0000 mg | ORAL_TABLET | ORAL | Status: DC | PRN
  Administered 2021-11-21 – 2021-11-22 (×3): 5 mg via ORAL
  Filled 2021-11-21 (×3): qty 1

## 2021-11-21 MED ORDER — LACTATED RINGERS IV SOLN: INTRAVENOUS | Status: DC

## 2021-11-21 MED ORDER — POVIDONE-IODINE 10 % EX SWAB
2.0000 "application " | Freq: Once | CUTANEOUS | Status: AC
  Administered 2021-11-21: 2 via TOPICAL

## 2021-11-21 MED ORDER — DIPHENHYDRAMINE HCL 12.5 MG/5ML PO ELIX: 12.5000 mg | ORAL_SOLUTION | ORAL | Status: DC | PRN

## 2021-11-21 MED ORDER — TAMSULOSIN HCL 0.4 MG PO CAPS
0.4000 mg | ORAL_CAPSULE | Freq: Every day | ORAL | Status: DC
  Administered 2021-11-21: 0.4 mg via ORAL
  Filled 2021-11-21: qty 1

## 2021-11-21 MED ORDER — DEXAMETHASONE SODIUM PHOSPHATE 10 MG/ML IJ SOLN
8.0000 mg | Freq: Once | INTRAMUSCULAR | Status: AC
  Administered 2021-11-21: 8 mg via INTRAVENOUS

## 2021-11-21 MED ORDER — FENTANYL CITRATE PF 50 MCG/ML IJ SOSY
50.0000 ug | PREFILLED_SYRINGE | INTRAMUSCULAR | Status: AC
  Administered 2021-11-21: 50 ug via INTRAVENOUS
  Filled 2021-11-21: qty 2

## 2021-11-21 MED ORDER — SODIUM CHLORIDE 0.9 % IV SOLN
INTRAVENOUS | Status: DC | PRN
  Administered 2021-11-21: 80 mL

## 2021-11-21 MED ORDER — DEXAMETHASONE SODIUM PHOSPHATE 10 MG/ML IJ SOLN
10.0000 mg | Freq: Once | INTRAMUSCULAR | Status: AC
  Administered 2021-11-22: 10 mg via INTRAVENOUS
  Filled 2021-11-21: qty 1

## 2021-11-21 MED ORDER — SIMVASTATIN 20 MG PO TABS: 10.0000 mg | ORAL_TABLET | Freq: Every day | ORAL | Status: DC

## 2021-11-21 MED ORDER — POTASSIUM CHLORIDE CRYS ER 10 MEQ PO TBCR
10.0000 meq | EXTENDED_RELEASE_TABLET | Freq: Every day | ORAL | Status: DC
  Administered 2021-11-21 – 2021-11-22 (×2): 10 meq via ORAL
  Filled 2021-11-21 (×2): qty 1

## 2021-11-21 MED ORDER — AMLODIPINE BESYLATE 5 MG PO TABS
2.5000 mg | ORAL_TABLET | Freq: Two times a day (BID) | ORAL | Status: DC
  Administered 2021-11-22: 2.5 mg via ORAL
  Filled 2021-11-21: qty 1

## 2021-11-21 MED ORDER — SODIUM CHLORIDE 0.9 % IV SOLN: INTRAVENOUS | Status: DC

## 2021-11-21 MED ORDER — METOCLOPRAMIDE HCL 5 MG/ML IJ SOLN: 5.0000 mg | Freq: Three times a day (TID) | INTRAMUSCULAR | Status: DC | PRN

## 2021-11-21 MED ORDER — BISACODYL 10 MG RE SUPP: 10.0000 mg | Freq: Every day | RECTAL | Status: DC | PRN

## 2021-11-21 MED ORDER — DEXAMETHASONE SODIUM PHOSPHATE 10 MG/ML IJ SOLN
INTRAMUSCULAR | Status: AC
  Filled 2021-11-21: qty 1

## 2021-11-21 MED ORDER — BUPIVACAINE IN DEXTROSE 0.75-8.25 % IT SOLN
INTRATHECAL | Status: DC | PRN
  Administered 2021-11-21: 1.6 mL via INTRATHECAL

## 2021-11-21 MED ORDER — ONDANSETRON HCL 4 MG/2ML IJ SOLN
INTRAMUSCULAR | Status: AC
  Filled 2021-11-21: qty 2

## 2021-11-21 MED ORDER — TRAMADOL HCL 50 MG PO TABS
50.0000 mg | ORAL_TABLET | Freq: Four times a day (QID) | ORAL | Status: DC | PRN
  Administered 2021-11-21: 50 mg via ORAL
  Administered 2021-11-22: 100 mg via ORAL
  Filled 2021-11-21: qty 1
  Filled 2021-11-21: qty 2

## 2021-11-21 MED ORDER — FENTANYL CITRATE PF 50 MCG/ML IJ SOSY: 25.0000 ug | PREFILLED_SYRINGE | INTRAMUSCULAR | Status: DC | PRN

## 2021-11-21 MED ORDER — ONDANSETRON HCL 4 MG/2ML IJ SOLN: 4.0000 mg | Freq: Four times a day (QID) | INTRAMUSCULAR | Status: DC | PRN

## 2021-11-21 MED ORDER — MIDAZOLAM HCL 2 MG/2ML IJ SOLN
1.0000 mg | INTRAMUSCULAR | Status: DC
  Filled 2021-11-21: qty 2

## 2021-11-21 MED ORDER — CEFAZOLIN SODIUM-DEXTROSE 2-4 GM/100ML-% IV SOLN
2.0000 g | INTRAVENOUS | Status: AC
  Administered 2021-11-21: 2 g via INTRAVENOUS
  Filled 2021-11-21: qty 100

## 2021-11-21 MED ORDER — PROPOFOL 500 MG/50ML IV EMUL
INTRAVENOUS | Status: DC | PRN
  Administered 2021-11-21: 75 ug/kg/min via INTRAVENOUS

## 2021-11-21 MED ORDER — ACETAMINOPHEN 500 MG PO TABS
1000.0000 mg | ORAL_TABLET | Freq: Four times a day (QID) | ORAL | Status: AC
  Administered 2021-11-21 – 2021-11-22 (×4): 1000 mg via ORAL
  Filled 2021-11-21 (×4): qty 2

## 2021-11-21 MED ORDER — BUPIVACAINE LIPOSOME 1.3 % IJ SUSP
INTRAMUSCULAR | Status: AC
  Filled 2021-11-21: qty 20

## 2021-11-21 MED ORDER — FAMOTIDINE 20 MG PO TABS
20.0000 mg | ORAL_TABLET | Freq: Every day | ORAL | Status: DC
  Administered 2021-11-21: 20 mg via ORAL
  Filled 2021-11-21: qty 1

## 2021-11-21 MED ORDER — ONDANSETRON HCL 4 MG PO TABS: 4.0000 mg | ORAL_TABLET | Freq: Four times a day (QID) | ORAL | Status: DC | PRN

## 2021-11-21 MED ORDER — ACETAMINOPHEN 10 MG/ML IV SOLN
1000.0000 mg | Freq: Four times a day (QID) | INTRAVENOUS | Status: DC
  Administered 2021-11-21: 1000 mg via INTRAVENOUS
  Filled 2021-11-21: qty 100

## 2021-11-21 MED ORDER — PHENYLEPHRINE 40 MCG/ML (10ML) SYRINGE FOR IV PUSH (FOR BLOOD PRESSURE SUPPORT)
PREFILLED_SYRINGE | INTRAVENOUS | Status: AC
  Filled 2021-11-21: qty 10

## 2021-11-21 MED ORDER — METHOCARBAMOL 500 MG PO TABS
500.0000 mg | ORAL_TABLET | Freq: Four times a day (QID) | ORAL | Status: DC | PRN
  Administered 2021-11-21 – 2021-11-22 (×3): 500 mg via ORAL
  Filled 2021-11-21 (×3): qty 1

## 2021-11-21 MED ORDER — TRANEXAMIC ACID-NACL 1000-0.7 MG/100ML-% IV SOLN
1000.0000 mg | INTRAVENOUS | Status: AC
  Administered 2021-11-21: 1000 mg via INTRAVENOUS
  Filled 2021-11-21: qty 100

## 2021-11-21 MED ORDER — CHLORHEXIDINE GLUCONATE CLOTH 2 % EX PADS: 6.0000 | MEDICATED_PAD | Freq: Every day | CUTANEOUS | Status: DC

## 2021-11-21 MED ORDER — METOCLOPRAMIDE HCL 5 MG PO TABS: 5.0000 mg | ORAL_TABLET | Freq: Three times a day (TID) | ORAL | Status: DC | PRN

## 2021-11-21 MED ORDER — RIVAROXABAN 10 MG PO TABS
10.0000 mg | ORAL_TABLET | Freq: Every day | ORAL | Status: DC
  Administered 2021-11-22: 10 mg via ORAL
  Filled 2021-11-21: qty 1

## 2021-11-21 MED ORDER — HYDROCHLOROTHIAZIDE 25 MG PO TABS
25.0000 mg | ORAL_TABLET | Freq: Every day | ORAL | Status: DC
  Administered 2021-11-22: 25 mg via ORAL
  Filled 2021-11-21: qty 1

## 2021-11-21 MED ORDER — MORPHINE SULFATE (PF) 2 MG/ML IV SOLN: 0.5000 mg | INTRAVENOUS | Status: DC | PRN

## 2021-11-21 MED ORDER — ORAL CARE MOUTH RINSE: 15.0000 mL | Freq: Once | OROMUCOSAL | Status: AC

## 2021-11-21 MED ORDER — BUPIVACAINE LIPOSOME 1.3 % IJ SUSP: 20.0000 mL | Freq: Once | INTRAMUSCULAR | Status: DC

## 2021-11-21 MED ORDER — CEFAZOLIN SODIUM-DEXTROSE 2-4 GM/100ML-% IV SOLN
2.0000 g | Freq: Four times a day (QID) | INTRAVENOUS | Status: AC
  Administered 2021-11-21 (×2): 2 g via INTRAVENOUS
  Filled 2021-11-21 (×2): qty 100

## 2021-11-21 MED ORDER — PANTOPRAZOLE SODIUM 40 MG PO TBEC
40.0000 mg | DELAYED_RELEASE_TABLET | Freq: Every day | ORAL | Status: DC
Start: 2021-11-22 — End: 2021-11-22
  Administered 2021-11-22: 40 mg via ORAL
  Filled 2021-11-21: qty 1

## 2021-11-21 MED ORDER — STERILE WATER FOR IRRIGATION IR SOLN
Status: DC | PRN
  Administered 2021-11-21: 2000 mL

## 2021-11-21 MED ORDER — PROPOFOL 10 MG/ML IV BOLUS
INTRAVENOUS | Status: DC | PRN
  Administered 2021-11-21: 20 mg via INTRAVENOUS
  Administered 2021-11-21: 30 mg via INTRAVENOUS

## 2021-11-21 MED ORDER — ONDANSETRON HCL 4 MG/2ML IJ SOLN
INTRAMUSCULAR | Status: DC | PRN
  Administered 2021-11-21: 4 mg via INTRAVENOUS

## 2021-11-21 SURGICAL SUPPLY — 54 items
ATTUNE MED DOME PAT 38 KNEE (Knees) ×1 IMPLANT
ATTUNE PS FEM LT SZ 8 CEM KNEE (Femur) ×1 IMPLANT
ATTUNE PSRP INSR SZ8 8 KNEE (Insert) ×1 IMPLANT
BAG COUNTER SPONGE SURGICOUNT (BAG) IMPLANT
BAG ZIPLOCK 12X15 (MISCELLANEOUS) ×2 IMPLANT
BASE TIBIAL ROT PLAT SZ 7 KNEE (Knees) IMPLANT
BLADE SAG 18X100X1.27 (BLADE) ×2 IMPLANT
BLADE SAW SGTL 11.0X1.19X90.0M (BLADE) ×2 IMPLANT
BNDG ELASTIC 6X5.8 VLCR STR LF (GAUZE/BANDAGES/DRESSINGS) ×2 IMPLANT
BOWL SMART MIX CTS (DISPOSABLE) ×2 IMPLANT
CEMENT HV SMART SET (Cement) ×4 IMPLANT
COVER SURGICAL LIGHT HANDLE (MISCELLANEOUS) ×2 IMPLANT
CUFF TOURN SGL QUICK 34 (TOURNIQUET CUFF) ×2
CUFF TRNQT CYL 34X4.125X (TOURNIQUET CUFF) ×1 IMPLANT
DECANTER SPIKE VIAL GLASS SM (MISCELLANEOUS) ×2 IMPLANT
DRAPE INCISE IOBAN 66X45 STRL (DRAPES) ×2 IMPLANT
DRAPE U-SHAPE 47X51 STRL (DRAPES) ×2 IMPLANT
DRESSING AQUACEL AG SP 3.5X10 (GAUZE/BANDAGES/DRESSINGS) IMPLANT
DRSG AQUACEL AG ADV 3.5X10 (GAUZE/BANDAGES/DRESSINGS) ×2 IMPLANT
DRSG AQUACEL AG SP 3.5X10 (GAUZE/BANDAGES/DRESSINGS) ×2
DURAPREP 26ML APPLICATOR (WOUND CARE) ×2 IMPLANT
ELECT REM PT RETURN 15FT ADLT (MISCELLANEOUS) ×2 IMPLANT
GLOVE SRG 8 PF TXTR STRL LF DI (GLOVE) ×1 IMPLANT
GLOVE SURG ENC MOIS LTX SZ6.5 (GLOVE) ×2 IMPLANT
GLOVE SURG ENC MOIS LTX SZ8 (GLOVE) ×4 IMPLANT
GLOVE SURG UNDER POLY LF SZ7 (GLOVE) ×2 IMPLANT
GLOVE SURG UNDER POLY LF SZ8 (GLOVE) ×2
GLOVE SURG UNDER POLY LF SZ8.5 (GLOVE) ×2 IMPLANT
GOWN STRL REUS W/TWL LRG LVL3 (GOWN DISPOSABLE) ×4 IMPLANT
GOWN STRL REUS W/TWL XL LVL3 (GOWN DISPOSABLE) ×2 IMPLANT
HANDPIECE INTERPULSE COAX TIP (DISPOSABLE) ×2
HOLDER FOLEY CATH W/STRAP (MISCELLANEOUS) IMPLANT
IMMOBILIZER KNEE 20 (SOFTGOODS) ×2
IMMOBILIZER KNEE 20 THIGH 36 (SOFTGOODS) ×1 IMPLANT
KIT TURNOVER KIT A (KITS) IMPLANT
MANIFOLD NEPTUNE II (INSTRUMENTS) ×2 IMPLANT
NS IRRIG 1000ML POUR BTL (IV SOLUTION) ×2 IMPLANT
PACK TOTAL KNEE CUSTOM (KITS) ×2 IMPLANT
PADDING CAST COTTON 6X4 STRL (CAST SUPPLIES) ×3 IMPLANT
PROTECTOR NERVE ULNAR (MISCELLANEOUS) ×2 IMPLANT
SET HNDPC FAN SPRY TIP SCT (DISPOSABLE) ×1 IMPLANT
SPONGE T-LAP 18X18 ~~LOC~~+RFID (SPONGE) ×6 IMPLANT
SPONGE T-LAP 4X18 ~~LOC~~+RFID (SPONGE) IMPLANT
STRIP CLOSURE SKIN 1/2X4 (GAUZE/BANDAGES/DRESSINGS) ×4 IMPLANT
SUT MNCRL AB 4-0 PS2 18 (SUTURE) ×2 IMPLANT
SUT STRATAFIX 0 PDS 27 VIOLET (SUTURE) ×2
SUT VIC AB 2-0 CT1 27 (SUTURE) ×6
SUT VIC AB 2-0 CT1 TAPERPNT 27 (SUTURE) ×3 IMPLANT
SUTURE STRATFX 0 PDS 27 VIOLET (SUTURE) ×1 IMPLANT
TIBIAL BASE ROT PLAT SZ 7 KNEE (Knees) ×2 IMPLANT
TRAY FOLEY MTR SLVR 16FR STAT (SET/KITS/TRAYS/PACK) ×2 IMPLANT
TUBE SUCTION HIGH CAP CLEAR NV (SUCTIONS) ×2 IMPLANT
WATER STERILE IRR 1000ML POUR (IV SOLUTION) ×4 IMPLANT
WRAP KNEE MAXI GEL POST OP (GAUZE/BANDAGES/DRESSINGS) ×2 IMPLANT

## 2021-11-21 NOTE — Plan of Care (Signed)

## 2021-11-21 NOTE — Evaluation (Signed)
Physical Therapy Evaluation Patient Details Name: Adrian Davis MRN: [id number] DOB: 07-27-1941 Today's Date: 11/21/2021  History of Present Illness  81 yo male s/p L TKA. PMH: R DA THA 2022, HTN, OA, GERD BPH, DDD, vocal cord leukoplakia, bil shoulder sugery.  Clinical Impression  Pt is s/p TKA resulting in the deficits listed below (see PT Problem List).  Pt feeling well, pain controlled. Pt amb ~ 52' with RW and min-guard assist.  Anticipate steady progress.   Pt will benefit from skilled PT to increase their independence and safety with mobility to allow discharge to the venue listed below.         Recommendations for follow up therapy are one component of a multi-disciplinary discharge planning process, led by the attending physician.  Recommendations may be updated based on patient status, additional functional criteria and insurance authorization.  Follow Up Recommendations Follow physician's recommendations for discharge plan and follow up therapies    Assistance Recommended at Discharge    Patient can return home with the following  Help with stairs or ramp for entrance;Assist for transportation    Equipment Recommendations BSC/3in1 (asking for 3in1)  Recommendations for Other Services       Functional Status Assessment Patient has had a recent decline in their functional status and demonstrates the ability to make significant improvements in function in a reasonable and predictable amount of time.     Precautions / Restrictions Precautions Precautions: Fall;Knee Required Braces or Orthoses: Knee Immobilizer - Right Knee Immobilizer - Right: Discontinue once straight leg raise with < 10 degree lag Restrictions Weight Bearing Restrictions: No LLE Weight Bearing: Weight bearing as tolerated      Mobility  Bed Mobility Overal bed mobility: Needs Assistance Bed Mobility: Supine to Sit     Supine to sit: Min guard     General bed mobility comments: for safety     Transfers Overall transfer level: Needs assistance Equipment used: Rolling walker (2 wheels) Transfers: Sit to/from Stand Sit to Stand: Min assist           General transfer comment: cues for hand placement and LLE position    Ambulation/Gait Ambulation/Gait assistance: Min assist, Min guard Gait Distance (Feet): 80 Feet Assistive device: Rolling walker (2 wheels) Gait Pattern/deviations: Step-to pattern, Step-through pattern, Decreased stance time - left       General Gait Details: cues for sequence and RW position, min to min/guard for balance and safety  Stairs            Wheelchair Mobility    Modified Rankin (Stroke Patients Only)       Balance                                             Pertinent Vitals/Pain Pain Assessment Pain Assessment: 0-10 Pain Score: 5  Pain Location: left knee Pain Descriptors / Indicators: Aching, Grimacing, Sore Pain Intervention(s): Repositioned, Premedicated before session, Monitored during session, Limited activity within patient's tolerance, Ice applied    Home Living Family/patient expects to be discharged to:: Private residence Living Arrangements: Spouse/significant other Available Help at Discharge: Family Type of Home: House Home Access: Stairs to enter Entrance Stairs-Rails: None Entrance Stairs-Number of Steps: 3   Home Layout: One level Home Equipment: Conservation officer, nature (2 wheels)      Prior Function Prior Level of Function : Independent/Modified Independent  Hand Dominance        Extremity/Trunk Assessment   Upper Extremity Assessment Upper Extremity Assessment: Overall WFL for tasks assessed    Lower Extremity Assessment Lower Extremity Assessment: LLE deficits/detail LLE Deficits / Details: ankle WFL, knee extension and hip flexion 3/5       Communication   Communication: No difficulties  Cognition Arousal/Alertness: Awake/alert Behavior  During Therapy: WFL for tasks assessed/performed Overall Cognitive Status: Within Functional Limits for tasks assessed                                          General Comments      Exercises Total Joint Exercises Ankle Circles/Pumps: AROM, 10 reps, Both Quad Sets: AROM, Both, 5 reps Heel Slides: AROM, Left (3 reps)   Assessment/Plan    PT Assessment Patient needs continued PT services  PT Problem List Decreased strength;Decreased mobility;Decreased range of motion;Decreased activity tolerance;Pain;Decreased knowledge of use of DME       PT Treatment Interventions DME instruction;Therapeutic exercise;Gait training;Functional mobility training;Therapeutic activities;Patient/family education;Stair training    PT Goals (Current goals can be found in the Care Plan section)  Acute Rehab PT Goals Patient Stated Goal: home soon PT Goal Formulation: With patient Time For Goal Achievement: 11/28/21 Potential to Achieve Goals: Good    Frequency 7X/week     Co-evaluation               AM-PAC PT "6 Clicks" Mobility  Outcome Measure Help needed turning from your back to your side while in a flat bed without using bedrails?: A Little Help needed moving from lying on your back to sitting on the side of a flat bed without using bedrails?: A Little Help needed moving to and from a bed to a chair (including a wheelchair)?: A Little Help needed standing up from a chair using your arms (e.g., wheelchair or bedside chair)?: A Little Help needed to walk in hospital room?: A Little Help needed climbing 3-5 steps with a railing? : A Little 6 Click Score: 18    End of Session Equipment Utilized During Treatment: Gait belt Activity Tolerance: Patient tolerated treatment well;Other (comment) Patient left: in chair;with call bell/phone within reach;with chair alarm set   PT Visit Diagnosis: Other abnormalities of gait and mobility (R26.89);Difficulty in walking, not  elsewhere classified (R26.2)    Time: 0737-1062 PT Time Calculation (min) (ACUTE ONLY): 29 min   Charges:   PT Evaluation $PT Eval Low Complexity: 1 Low PT Treatments $Gait Training: 8-22 mins        Baxter Flattery, PT  Acute Rehab Dept ([place]) [phone] Pager [phone]  11/21/2021   [facility] 11/21/2021, 3:44 PM

## 2021-11-21 NOTE — Anesthesia Procedure Notes (Signed)
Spinal  Patient location during procedure: OR Start time: 11/21/2021 9:11 AM End time: 11/21/2021 9:18 AM Reason for block: surgical anesthesia Staffing Performed: resident/CRNA  Resident/CRNA: Lollie Sails, CRNA Preanesthetic Checklist Completed: patient identified, IV checked, site marked, risks and benefits discussed, surgical consent, monitors and equipment checked, pre-op evaluation and timeout performed Spinal Block Patient position: sitting Prep: DuraPrep and site prepped and draped Patient monitoring: heart rate, continuous pulse ox, blood pressure and cardiac monitor Approach: midline Location: L2-3 Injection technique: single-shot Needle Needle type: Pencan  Needle gauge: 24 G Needle length: 10 cm Assessment Events: CSF return Additional Notes Expiration date on kit noted and within range.  Sterile prep and drape.    Local with Lidocaine 1%.  Good CSF flow noted with no heme or c/o paresthesia.   Patient tolerated well.

## 2021-11-21 NOTE — Interval H&P Note (Signed)
History and Physical Interval Note:  11/21/2021 6:55 AM  Adrian Davis  has presented today for surgery, with the diagnosis of left knee osteoarthritis.  The various methods of treatment have been discussed with the patient and family. After consideration of risks, benefits and other options for treatment, the patient has consented to  Procedure(s): TOTAL KNEE ARTHROPLASTY (Left) as a surgical intervention.  The patient's history has been reviewed, patient examined, no change in status, stable for surgery.  I have reviewed the patient's chart and labs.  Questions were answered to the patient's satisfaction.     Pilar Plate Arvon Schreiner

## 2021-11-21 NOTE — Progress Notes (Signed)
Orthopedic Tech Progress Note Patient Details:  ABRHAM MASLOWSKI 22-Jun-1941 [id number]  CPM Left Knee CPM Left Knee: On Left Knee Flexion (Degrees): 40 Left Knee Extension (Degrees): 10 Additional Comments: left  Post Interventions Patient Tolerated: Well Instructions Provided: Care of device  [place] Pink 11/21/2021, 10:37 AM

## 2021-11-21 NOTE — Anesthesia Procedure Notes (Signed)
Anesthesia Regional Block: Adductor canal block   Pre-Anesthetic Checklist: , timeout performed,  Correct Patient, Correct Site, Correct Laterality,  Correct Procedure, Correct Position, site marked,  Risks and benefits discussed,  Pre-op evaluation,  At surgeon's request and post-op pain management  Laterality: Left  Prep: Maximum Sterile Barrier Precautions used, chloraprep       Needles:  Injection technique: Single-shot  Needle Type: Echogenic Stimulator Needle     Needle Length: 9cm  Needle Gauge: 21     Additional Needles:   Procedures:,,,, ultrasound used (permanent image in chart),,    Narrative:  Start time: 11/21/2021 8:18 AM End time: 11/21/2021 8:28 AM Injection made incrementally with aspirations every 5 mL.  Performed by: Personally  Anesthesiologist: Roderic Palau, MD  Additional Notes:

## 2021-11-21 NOTE — Transfer of Care (Signed)
Immediate Anesthesia Transfer of Care Note  Patient: Linsey Hirota Mcjunkins  Procedure(s) Performed: TOTAL KNEE ARTHROPLASTY (Left: Knee)  Patient Location: PACU  Anesthesia Type:Spinal and MAC combined with regional for post-op pain  Level of Consciousness: awake, alert  and oriented  Airway & Oxygen Therapy: Patient Spontanous Breathing and Patient connected to face mask oxygen  Post-op Assessment: Report given to RN and Post -op Vital signs reviewed and stable  Post vital signs: Reviewed and stable  Last Vitals:  Vitals Value Taken Time  BP 111/66 11/21/21 1039  Temp    Pulse 81 11/21/21 1041  Resp 7 11/21/21 1041  SpO2 100 % 11/21/21 1041  Vitals shown include unvalidated device data.  Last Pain:  Vitals:   11/21/21 0825  TempSrc:   PainSc: 3       Patients Stated Pain Goal: 3 (20/60/15 6153)  Complications: No notable events documented.

## 2021-11-21 NOTE — Op Note (Signed)
OPERATIVE REPORT-TOTAL KNEE ARTHROPLASTY   Pre-operative diagnosis- Osteoarthritis  Left knee(s)  Post-operative diagnosis- Osteoarthritis Left knee(s)  Procedure-  Left  Total Knee Arthroplasty  Surgeon- Dione Plover. Deroy Noah, MD  Assistant- Theresa Duty, PA-C   Anesthesia-   Adductor canal block and spinal  EBL-25 mL   Drains None  Tourniquet time-  Total Tourniquet Time Documented: Thigh (Left) - 34 minutes Total: Thigh (Left) - 34 minutes     Complications- None  Condition-PACU - hemodynamically stable.   Brief Clinical Note   Adrian Davis is a 81 y.o. year old male with end stage OA of his left knee with progressively worsening pain and dysfunction. He has constant pain, with activity and at rest and significant functional deficits with difficulties even with ADLs. He has had extensive non-op management including analgesics, injections of cortisone and viscosupplements, and home exercise program, but remains in significant pain with significant dysfunction. Radiographs show bone on bone arthritis medial and patellofemoral. He presents now for left Total Knee Arthroplasty.     Procedure in detail---   The patient is brought into the operating room and positioned supine on the operating table. After successful administration of  Adductor canal block and spinal,   a tourniquet is placed high on the  Left thigh(s) and the lower extremity is prepped and draped in the usual sterile fashion. Time out is performed by the operating team and then the  Left lower extremity is wrapped in Esmarch, knee flexed and the tourniquet inflated to 300 mmHg.       A midline incision is made with a ten blade through the subcutaneous tissue to the level of the extensor mechanism. A fresh blade is used to make a medial parapatellar arthrotomy. Soft tissue over the proximal medial tibia is subperiosteally elevated to the joint line with a knife and into the semimembranosus bursa with a Cobb  elevator. Soft tissue over the proximal lateral tibia is elevated with attention being paid to avoiding the patellar tendon on the tibial tubercle. The patella is everted, knee flexed 90 degrees and the ACL and PCL are removed. Findings are bone on bone medial and patellofemoral with large global osteophytes.        The drill is used to create a starting hole in the distal femur and the canal is thoroughly irrigated with sterile saline to remove the fatty contents. The 5 degree Left  valgus alignment guide is placed into the femoral canal and the distal femoral cutting block is pinned to remove 9 mm off the distal femur. Resection is made with an oscillating saw.      The tibia is subluxed forward and the menisci are removed. The extramedullary alignment guide is placed referencing proximally at the medial aspect of the tibial tubercle and distally along the second metatarsal axis and tibial crest. The block is pinned to remove 57mm off the more deficient medial  side. Resection is made with an oscillating saw. Size 7is the most appropriate size for the tibia and the proximal tibia is prepared with the modular drill and keel punch for that size.      The femoral sizing guide is placed and size 8 is most appropriate. Rotation is marked off the epicondylar axis and confirmed by creating a rectangular flexion gap at 90 degrees. The size 8 cutting block is pinned in this rotation and the anterior, posterior and chamfer cuts are made with the oscillating saw. The intercondylar block is then placed and that cut  is made.      Trial size 7 tibial component, trial size 8 posterior stabilized femur and a 8  mm posterior stabilized rotating platform insert trial is placed. Full extension is achieved with excellent varus/valgus and anterior/posterior balance throughout full range of motion. The patella is everted and thickness measured to be 24  mm. Free hand resection is taken to 14 mm, a 38 template is placed, lug holes  are drilled, trial patella is placed, and it tracks normally. Osteophytes are removed off the posterior femur with the trial in place. All trials are removed and the cut bone surfaces prepared with pulsatile lavage. Cement is mixed and once ready for implantation, the size 7 tibial implant, size  8 posterior stabilized femoral component, and the size 38 patella are cemented in place and the patella is held with the clamp. The trial insert is placed and the knee held in full extension. The Exparel (20 ml mixed with 60 ml saline) is injected into the extensor mechanism, posterior capsule, medial and lateral gutters and subcutaneous tissues.  All extruded cement is removed and once the cement is hard the permanent 8 mm posterior stabilized rotating platform insert is placed into the tibial tray.      The wound is copiously irrigated with saline solution and the extensor mechanism closed with # 0 Stratofix suture. The tourniquet is released for a total tourniquet time of 34  minutes. Flexion against gravity is 140 degrees and the patella tracks normally. Subcutaneous tissue is closed with 2.0 vicryl and subcuticular with running 4.0 Monocryl. The incision is cleaned and dried and steri-strips and a bulky sterile dressing are applied. The limb is placed into a knee immobilizer and the patient is awakened and transported to recovery in stable condition.      Please note that a surgical assistant was a medical necessity for this procedure in order to perform it in a safe and expeditious manner. Surgical assistant was necessary to retract the ligaments and vital neurovascular structures to prevent injury to them and also necessary for proper positioning of the limb to allow for anatomic placement of the prosthesis.   Dione Plover Adrian Tangen, MD    11/21/2021, 10:19 AM

## 2021-11-21 NOTE — Anesthesia Preprocedure Evaluation (Addendum)
Anesthesia Evaluation  Patient identified by MRN, date of birth, ID band Patient awake    Reviewed: Allergy & Precautions, H&P , NPO status , Patient's Chart, lab work & pertinent test results  Airway Mallampati: III  TM Distance: >3 FB Neck ROM: Full    Dental no notable dental hx. (+) Teeth Intact, Dental Advisory Given   Pulmonary neg pulmonary ROS, former smoker,    Pulmonary exam normal breath sounds clear to auscultation       Cardiovascular hypertension, Pt. on medications  Rhythm:Regular Rate:Normal     Neuro/Psych negative neurological ROS  negative psych ROS   GI/Hepatic Neg liver ROS, GERD  Medicated,  Endo/Other  negative endocrine ROS  Renal/GU negative Renal ROS  negative genitourinary   Musculoskeletal  (+) Arthritis , Osteoarthritis,    Abdominal   Peds  Hematology  (+) Blood dyscrasia, anemia ,   Anesthesia Other Findings   Reproductive/Obstetrics negative OB ROS                            Anesthesia Physical Anesthesia Plan  ASA: 2  Anesthesia Plan: Spinal   Post-op Pain Management: Regional block and Ofirmev IV (intra-op)   Induction: Intravenous  PONV Risk Score and Plan: 2 and Propofol infusion and Ondansetron  Airway Management Planned: Natural Airway and Simple Face Mask  Additional Equipment:   Intra-op Plan:   Post-operative Plan:   Informed Consent: I have reviewed the patients History and Physical, chart, labs and discussed the procedure including the risks, benefits and alternatives for the proposed anesthesia with the patient or authorized representative who has indicated his/her understanding and acceptance.     Dental advisory given  Plan Discussed with: CRNA  Anesthesia Plan Comments:         Anesthesia Quick Evaluation

## 2021-11-21 NOTE — Discharge Instructions (Addendum)
Adrian Arabian, MD Total Joint Specialist [facility] [place] [address], [address] [place], [place] [address] [phone]  TOTAL KNEE REPLACEMENT POSTOPERATIVE DIRECTIONS    Knee Rehabilitation, Guidelines Following Surgery  Results after knee surgery are often greatly improved when you follow the exercise, range of motion and muscle strengthening exercises prescribed by your doctor. Safety measures are also important to protect the knee from further injury. If any of these exercises cause you to have increased pain or swelling in your knee joint, decrease the amount until you are comfortable again and slowly increase them. If you have problems or questions, call your caregiver or physical therapist for advice.   BLOOD CLOT PREVENTION Take a 10 mg Xarelto once a day for three weeks following surgery. Then resume one 81 mg aspirin once a day. You may resume your vitamins/supplements once you have discontinued the Xarelto. Do not take any NSAIDs (Advil, Aleve, Ibuprofen, Meloxicam, etc.) until you have discontinued the Xarelto.   Information on my medicine - XARELTO (Rivaroxaban)  Why was Xarelto prescribed for you? Xarelto was prescribed for you to reduce the risk of blood clots forming after orthopedic surgery. The medical term for these abnormal blood clots is venous thromboembolism (VTE).  What do you need to know about xarelto ? Take your Xarelto ONCE DAILY at the same time every day. You may take it either with or without food.  If you have difficulty swallowing the tablet whole, you may crush it and mix in applesauce just prior to taking your dose.  Take Xarelto exactly as prescribed by your doctor and DO NOT stop taking Xarelto without talking to the doctor who prescribed the medication.  Stopping without other VTE prevention medication to take the place of Xarelto may increase your risk of developing a clot.  After discharge, you should have regular  check-up appointments with your healthcare provider that is prescribing your Xarelto.    What do you do if you miss a dose? If you miss a dose, take it as soon as you remember on the same day then continue your regularly scheduled once daily regimen the next day. Do not take two doses of Xarelto on the same day.   Important Safety Information A possible side effect of Xarelto is bleeding. You should call your healthcare provider right away if you experience any of the following: Bleeding from an injury or your nose that does not stop. Unusual colored urine (red or dark brown) or unusual colored stools (red or black). Unusual bruising for unknown reasons. A serious fall or if you hit your head (even if there is no bleeding).  Some medicines may interact with Xarelto and might increase your risk of bleeding while on Xarelto. To help avoid this, consult your healthcare provider or pharmacist prior to using any new prescription or non-prescription medications, including herbals, vitamins, non-steroidal anti-inflammatory drugs (NSAIDs) and supplements.  This website has more information on Xarelto: [email or web address]://guerra-benson.com/.    HOME CARE INSTRUCTIONS  Remove items at home which could result in a fall. This includes throw rugs or furniture in walking pathways.  ICE to the affected knee as much as tolerated. Icing helps control swelling. If the swelling is well controlled you will be more comfortable and rehab easier. Continue to use ice on the knee for pain and swelling from surgery. You may notice swelling that will progress down to the foot and ankle. This is normal after surgery. Elevate the leg when you are not up walking  on it.    Continue to use the breathing machine which will help keep your temperature down. It is common for your temperature to cycle up and down following surgery, especially at night when you are not up moving around and exerting yourself. The breathing machine keeps your lungs  expanded and your temperature down. Do not place pillow under the operative knee, focus on keeping the knee straight while resting  DIET You may resume your previous home diet once you are discharged from the hospital.  DRESSING / WOUND CARE / SHOWERING Keep your bulky bandage on for 2 days. On the third post-operative day you may remove the Ace bandage and gauze. There is a waterproof adhesive bandage on your skin which will stay in place until your first follow-up appointment. Once you remove this you will not need to place another bandage You may begin showering 3 days following surgery, but do not submerge the incision under water.  ACTIVITY For the first 5 days, the key is rest and control of pain and swelling Do your home exercises twice a day starting on post-operative day 3. On the days you go to physical therapy, just do the home exercises once that day. You should rest, ice and elevate the leg for 50 minutes out of every hour. Get up and walk/stretch for 10 minutes per hour. After 5 days you can increase your activity slowly as tolerated. Walk with your walker as instructed. Use the walker until you are comfortable transitioning to a cane. Walk with the cane in the opposite hand of the operative leg. You may discontinue the cane once you are comfortable and walking steadily. Avoid periods of inactivity such as sitting longer than an hour when not asleep. This helps prevent blood clots.  You may discontinue the knee immobilizer once you are able to perform a straight leg raise while lying down. You may resume a sexual relationship in one month or when given the OK by your doctor.  You may return to work once you are cleared by your doctor.  Do not drive a car for 6 weeks or until released by your surgeon.  Do not drive while taking narcotics.  TED HOSE STOCKINGS Wear the elastic stockings on both legs for three weeks following surgery during the day. You may remove them at night for  sleeping.  WEIGHT BEARING Weight bearing as tolerated with assist device (walker, cane, etc) as directed, use it as long as suggested by your surgeon or therapist, typically at least 4-6 weeks.  POSTOPERATIVE CONSTIPATION PROTOCOL Constipation - defined medically as fewer than three stools per week and severe constipation as less than one stool per week.  One of the most common issues patients have following surgery is constipation.  Even if you have a regular bowel pattern at home, your normal regimen is likely to be disrupted due to multiple reasons following surgery.  Combination of anesthesia, postoperative narcotics, change in appetite and fluid intake all can affect your bowels.  In order to avoid complications following surgery, here are some recommendations in order to help you during your recovery period.  Colace (docusate) - Pick up an over-the-counter form of Colace or another stool softener and take twice a day as long as you are requiring postoperative pain medications.  Take with a full glass of water daily.  If you experience loose stools or diarrhea, hold the colace until you stool forms back up. If your symptoms do not get better within 1 week  or if they get worse, check with your doctor. Dulcolax (bisacodyl) - Pick up over-the-counter and take as directed by the product packaging as needed to assist with the movement of your bowels.  Take with a full glass of water.  Use this product as needed if not relieved by Colace only.  MiraLax (polyethylene glycol) - Pick up over-the-counter to have on hand. MiraLax is a solution that will increase the amount of water in your bowels to assist with bowel movements.  Take as directed and can mix with a glass of water, juice, soda, coffee, or tea. Take if you go more than two days without a movement. Do not use MiraLax more than once per day. Call your doctor if you are still constipated or irregular after using this medication for 7 days in a  row.  If you continue to have problems with postoperative constipation, please contact the office for further assistance and recommendations.  If you experience "the worst abdominal pain ever" or develop nausea or vomiting, please contact the office immediatly for further recommendations for treatment.  ITCHING If you experience itching with your medications, try taking only a single pain pill, or even half a pain pill at a time.  You can also use Benadryl over the counter for itching or also to help with sleep.   MEDICATIONS See your medication summary on the After Visit Summary that the nursing staff will review with you prior to discharge.  You may have some home medications which will be placed on hold until you complete the course of blood thinner medication.  It is important for you to complete the blood thinner medication as prescribed by your surgeon.  Continue your approved medications as instructed at time of discharge.  PRECAUTIONS If you experience chest pain or shortness of breath - call 911 immediately for transfer to the hospital emergency department.  If you develop a fever greater that 101 F, purulent drainage from wound, increased redness or drainage from wound, foul odor from the wound/dressing, or calf pain - CONTACT YOUR SURGEON.                                                   FOLLOW-UP APPOINTMENTS Make sure you keep all of your appointments after your operation with your surgeon and caregivers. You should call the office at the above phone number and make an appointment for approximately two weeks after the date of your surgery or on the date instructed by your surgeon outlined in the "After Visit Summary".  RANGE OF MOTION AND STRENGTHENING EXERCISES  Rehabilitation of the knee is important following a knee injury or an operation. After just a few days of immobilization, the muscles of the thigh which control the knee become weakened and shrink (atrophy). Knee exercises  are designed to build up the tone and strength of the thigh muscles and to improve knee motion. Often times heat used for twenty to thirty minutes before working out will loosen up your tissues and help with improving the range of motion but do not use heat for the first two weeks following surgery. These exercises can be done on a training (exercise) mat, on the floor, on a table or on a bed. Use what ever works the best and is most comfortable for you Knee exercises include:  Leg Lifts - While  your knee is still immobilized in a splint or cast, you can do straight leg raises. Lift the leg to 60 degrees, hold for 3 sec, and slowly lower the leg. Repeat 10-20 times 2-3 times daily. Perform this exercise against resistance later as your knee gets better.  Quad and Hamstring Sets - Tighten up the muscle on the front of the thigh (Quad) and hold for 5-10 sec. Repeat this 10-20 times hourly. Hamstring sets are done by pushing the foot backward against an object and holding for 5-10 sec. Repeat as with quad sets.  Leg Slides: Lying on your back, slowly slide your foot toward your buttocks, bending your knee up off the floor (only go as far as is comfortable). Then slowly slide your foot back down until your leg is flat on the floor again. Angel Wings: Lying on your back spread your legs to the side as far apart as you can without causing discomfort.  A rehabilitation program following serious knee injuries can speed recovery and prevent re-injury in the future due to weakened muscles. Contact your doctor or a physical therapist for more information on knee rehabilitation.   POST-OPERATIVE OPIOID TAPER INSTRUCTIONS: It is important to wean off of your opioid medication as soon as possible. If you do not need pain medication after your surgery it is ok to stop day one. Opioids include: Codeine, Hydrocodone(Norco, Vicodin), Oxycodone(Percocet, oxycontin) and hydromorphone amongst others.  Long term and even short  term use of opiods can cause: Increased pain response Dependence Constipation Depression Respiratory depression And more.  Withdrawal symptoms can include Flu like symptoms Nausea, vomiting And more Techniques to manage these symptoms Hydrate well Eat regular healthy meals Stay active Use relaxation techniques(deep breathing, meditating, yoga) Do Not substitute Alcohol to help with tapering If you have been on opioids for less than two weeks and do not have pain than it is ok to stop all together.  Plan to wean off of opioids This plan should start within one week post op of your joint replacement. Maintain the same interval or time between taking each dose and first decrease the dose.  Cut the total daily intake of opioids by one tablet each day Next start to increase the time between doses. The last dose that should be eliminated is the evening dose.   IF YOU ARE TRANSFERRED TO A SKILLED REHAB FACILITY If the patient is transferred to a skilled rehab facility following release from the hospital, a list of the current medications will be sent to the facility for the patient to continue.  When discharged from the skilled rehab facility, please have the facility set up the patient's [place] prior to being released. Also, the skilled facility will be responsible for providing the patient with their medications at time of release from the facility to include their pain medication, the muscle relaxants, and their blood thinner medication. If the patient is still at the rehab facility at time of the two week follow up appointment, the skilled rehab facility will also need to assist the patient in arranging follow up appointment in our office and any transportation needs.  MAKE SURE YOU:  Understand these instructions.  Get help right away if you are not doing well or get worse.   DENTAL ANTIBIOTICS:  In most cases prophylactic antibiotics for Dental procdeures after  total joint surgery are not necessary.  Exceptions are as follows:  1. History of prior total joint infection  2. Severely immunocompromised (Organ  Transplant, cancer chemotherapy, Rheumatoid biologic meds such as [place])  3. Poorly controlled diabetes (A1C &gt; 8.0, blood glucose over 200)  If you have one of these conditions, contact your surgeon for an antibiotic prescription, prior to your dental procedure.    Pick up stool softner and laxative for home use following surgery while on pain medications. Do not submerge incision under water. Please use good hand washing techniques while changing dressing each day. May shower starting three days after surgery. Please use a clean towel to pat the incision dry following showers. Continue to use ice for pain and swelling after surgery. Do not use any lotions or creams on the incision until instructed by your surgeon.

## 2021-11-21 NOTE — Care Plan (Signed)
Ortho Bundle Case Management Note  Patient Details  Name: VERNARD GRAM MRN: [id number] Date of Birth: Aug 03, 1941  L TKA on 11-21-21 DCP:  Home with wife DME:  No needs, has a RW PT:  OP PT at [facility] on 11-24-21.                   DME Arranged:  N/A DME Agency:  NA  HH Arranged:  NA HH Agency:  NA  Additional Comments: Please contact me with any questions of if this plan should need to change.  Marianne Sofia, RN,CCM [facility]  [phone] 11/21/2021, 8:38 AM

## 2021-11-21 NOTE — Anesthesia Procedure Notes (Signed)
Procedure Name: MAC Date/Time: 11/21/2021 9:09 AM Performed by: Lollie Sails, CRNA Pre-anesthesia Checklist: Patient identified, Emergency Drugs available, Suction available, Patient being monitored and Timeout performed Oxygen Delivery Method: Simple face mask Placement Confirmation: positive ETCO2

## 2021-11-21 NOTE — Anesthesia Postprocedure Evaluation (Signed)
Anesthesia Post Note  Patient: Adrian Davis  Procedure(s) Performed: TOTAL KNEE ARTHROPLASTY (Left: Knee)     Patient location during evaluation: PACU Anesthesia Type: Spinal and Regional Level of consciousness: oriented and awake and alert Pain management: pain level controlled Vital Signs Assessment: post-procedure vital signs reviewed and stable Respiratory status: spontaneous breathing, respiratory function stable and patient connected to nasal cannula oxygen Cardiovascular status: blood pressure returned to baseline and stable Postop Assessment: no headache, no backache, no apparent nausea or vomiting, spinal receding and patient able to bend at knees Anesthetic complications: no   No notable events documented.  Last Vitals:  Vitals:   11/21/21 1145 11/21/21 1202  BP: 140/62 (!) 141/75  Pulse: 65 64  Resp: 15 15  Temp: 36.6 C 36.6 C  SpO2: 100% 99%    Last Pain:  Vitals:   11/21/21 1202  TempSrc: Oral  PainSc: 0-No pain                 Demitrious Mccannon,W. EDMOND

## 2021-11-21 NOTE — Progress Notes (Signed)
Assisted Dr. Edmond Fitzgerald with left, ultrasound guided, adductor canal block. Side rails up, monitors on throughout procedure. See vital signs in flow sheet. Tolerated Procedure well. 

## 2021-11-22 ENCOUNTER — Encounter (HOSPITAL_COMMUNITY): Payer: Self-pay | Admitting: Orthopedic Surgery

## 2021-11-22 DIAGNOSIS — M1712 Unilateral primary osteoarthritis, left knee: Secondary | ICD-10-CM | POA: Diagnosis not present

## 2021-11-22 DIAGNOSIS — Z79899 Other long term (current) drug therapy: Secondary | ICD-10-CM | POA: Diagnosis not present

## 2021-11-22 DIAGNOSIS — I10 Essential (primary) hypertension: Secondary | ICD-10-CM | POA: Diagnosis not present

## 2021-11-22 DIAGNOSIS — Z87891 Personal history of nicotine dependence: Secondary | ICD-10-CM | POA: Diagnosis not present

## 2021-11-22 LAB — BASIC METABOLIC PANEL
Anion gap: 7 (ref 5–15)
BUN: 16 mg/dL (ref 8–23)
CO2: 25 mmol/L (ref 22–32)
Calcium: 8.8 mg/dL — ABNORMAL LOW (ref 8.9–10.3)
Chloride: 105 mmol/L (ref 98–111)
Creatinine, Ser: 0.78 mg/dL (ref 0.61–1.24)
GFR, Estimated: >60 mL/min (ref >60)
Glucose, Bld: 133 mg/dL — ABNORMAL HIGH (ref 70–99)
Potassium: 4.4 mmol/L (ref 3.5–5.1)
Sodium: 137 mmol/L (ref 135–145)

## 2021-11-22 LAB — CBC
HCT: 34.4 % — ABNORMAL LOW (ref 39.0–52.0)
Hemoglobin: 11 g/dL — ABNORMAL LOW (ref 13.0–17.0)
MCH: 30.6 pg (ref 26.0–34.0)
MCHC: 32 g/dL (ref 30.0–36.0)
MCV: 95.8 fL (ref 80.0–100.0)
Platelets: 193 10*3/uL (ref 150–400)
RBC: 3.59 MIL/uL — ABNORMAL LOW (ref 4.22–5.81)
RDW: 13.5 % (ref 11.5–15.5)
WBC: 13.7 10*3/uL — ABNORMAL HIGH (ref 4.0–10.5)
nRBC: 0 % (ref 0.0–0.2)

## 2021-11-22 LAB — URIC ACID: Uric Acid, Serum: 6.8 mg/dL (ref 3.7–8.6)

## 2021-11-22 MED ORDER — RIVAROXABAN 10 MG PO TABS: 10.0000 mg | ORAL_TABLET | Freq: Every day | ORAL | 0 refills | Status: AC

## 2021-11-22 MED ORDER — OXYCODONE HCL 5 MG PO TABS: 5.0000 mg | ORAL_TABLET | Freq: Four times a day (QID) | ORAL | 0 refills | Status: DC | PRN

## 2021-11-22 MED ORDER — TRAMADOL HCL 50 MG PO TABS: 50.0000 mg | ORAL_TABLET | Freq: Four times a day (QID) | ORAL | 0 refills | Status: DC | PRN

## 2021-11-22 MED ORDER — METHOCARBAMOL 500 MG PO TABS: 500.0000 mg | ORAL_TABLET | Freq: Four times a day (QID) | ORAL | 0 refills | Status: DC | PRN

## 2021-11-22 NOTE — Progress Notes (Signed)
Subjective: 1 Day Post-Op Procedure(s) (LRB): TOTAL KNEE ARTHROPLASTY (Left) Patient reports pain as mild.   Patient seen in rounds by Dr. Wynelle Link. Patient is well, and has had no acute complaints or problems No issues overnight. Denies chest pain, SOB, or calf pain. Foley catheter removed this AM. Checked a uric acid level due to wife's concern of gout (right great toe pain), which came back negative. We will continue therapy today, ambulated 14' yesterday.   Objective: Vital signs in last 24 hours: Temp:  [97.5 F (36.4 C)-98.4 F (36.9 C)] 97.6 F (36.4 C) (01/31 0537) Pulse Rate:  [54-97] 66 (01/31 0537) Resp:  [10-18] 18 (01/31 0537) BP: (101-150)/(62-83) 137/78 (01/31 0537) SpO2:  [96 %-100 %] 96 % (01/31 0537) Weight:  [78.7 kg] 78.7 kg (01/30 0735)  Intake/Output from previous day:  Intake/Output Summary (Last 24 hours) at 11/22/2021 0724 Last data filed at 11/22/2021 0500 Gross per 24 hour  Intake 3795.48 ml  Output 1860 ml  Net 1935.48 ml     Intake/Output this shift: No intake/output data recorded.  Labs: Recent Labs    11/22/21 0317  HGB 11.0*   Recent Labs    11/22/21 0317  WBC 13.7*  RBC 3.59*  HCT 34.4*  PLT 193   Recent Labs    11/22/21 0317  NA 137  K 4.4  CL 105  CO2 25  BUN 16  CREATININE 0.78  GLUCOSE 133*  CALCIUM 8.8*   No results for input(s): LABPT, INR in the last 72 hours.  Exam: General - Patient is Alert and Oriented Extremity - Neurologically intact Neurovascular intact Sensation intact distally Dorsiflexion/Plantar flexion intact Dressing - dressing C/D/I Motor Function - intact, moving foot and toes well on exam.   Past Medical History:  Diagnosis Date   Abnormal echocardiogram Jan 2012   grade 1 diastolic dysfunction, normal EF, mild LVH   Anemia    Basal cell carcinoma 04/08/2019   left nose inferior to nasal alar groove. EDC: 05/20/2019   Bell palsy 10/2010   presented with facial numbness   Benign  prostatic hyperplasia (BPH) with urinary urgency    Degenerative disk disease    GERD (gastroesophageal reflux disease)    History of kidney stones    Hyperlipidemia    Hypertension    dr [name]   Kidney cysts    Lumbar stenosis    Nephrolithiasis    Normal nuclear stress test 2008   Osteoarthritis    Skin cancer    Vocal cord leukoplakia    Dr Erik Obey    Assessment/Plan: 1 Day Post-Op Procedure(s) (LRB): TOTAL KNEE ARTHROPLASTY (Left) Principal Problem:   OA (osteoarthritis) of knee Active Problems:   Primary osteoarthritis of left knee  Estimated body mass index is 26.79 kg/m as calculated from the following:   Height as of this encounter: 5' 7.5" (1.715 m).   Weight as of this encounter: 78.7 kg. Advance diet Up with therapy D/C IV fluids   Patient's anticipated LOS is less than 2 midnights, meeting these requirements: - Lives within 1 hour of care - Has a competent adult at home to recover with post-op recover - NO history of  - Chronic pain requiring opioids  - Diabetes  - Coronary Artery Disease  - Heart failure  - Heart attack  - Stroke  - DVT/VTE  - Cardiac arrhythmia  - Respiratory Failure/COPD  - Renal failure  - Anemia  - Advanced Liver disease  DVT Prophylaxis - Xarelto Weight  bearing as tolerated. Continue therapy.  Plan is to go Home after hospital stay. Plan for discharge later today if progresses with therapy and meeting goals. Scheduled for OPP[facility] at [facility]). Follow-up in the office in 2 weeks.  The PDMP database was reviewed today prior to any opioid medications being prescribed to this patient.  Theresa Duty, PA-C Orthopedic Surgery [phone] 11/22/2021, 7:24 AM

## 2021-11-22 NOTE — Plan of Care (Signed)

## 2021-11-22 NOTE — Progress Notes (Signed)
Physical Therapy Treatment Patient Details Name: Adrian Davis MRN: [id number] DOB: 05-19-1941 Today's Date: 11/22/2021   History of Present Illness 81 yo male s/p L TKA. PMH: R DA THA 2022, HTN, OA, GERD BPH, DDD, vocal cord leukoplakia, bil shoulder sugery.    PT Comments    Pt is progressing well. Anxious to d/c home today. Will see again to review stairs with pt and wife and pt should be ready to d/c later this afternoon. Pt wife with many questions regarding pt's R foot issues, cane for home etc. Reviewed possible pes cavus deformity pt may have R foot, defer further assessment to OPPT   Recommendations for follow up therapy are one component of a multi-disciplinary discharge planning process, led by the attending physician.  Recommendations may be updated based on patient status, additional functional criteria and insurance authorization.  Follow Up Recommendations  Follow physician's recommendations for discharge plan and follow up therapies     Assistance Recommended at Discharge Intermittent Supervision/Assistance  Patient can return home with the following Help with stairs or ramp for entrance;Assist for transportation   Equipment Recommendations  BSC/3in1 (requesting 3in1)    Recommendations for Other Services       Precautions / Restrictions Precautions Precautions: Fall;Knee Precaution Comments: IND SLRs Required Braces or Orthoses: Knee Immobilizer - Right Knee Immobilizer - Right: Discontinue once straight leg raise with < 10 degree lag Restrictions Weight Bearing Restrictions: No LLE Weight Bearing: Weight bearing as tolerated     Mobility  Bed Mobility   Bed Mobility: Supine to Sit     Supine to sit: Supervision     General bed mobility comments: for safety    Transfers Overall transfer level: Needs assistance Equipment used: Rolling walker (2 wheels) Transfers: Sit to/from Stand Sit to Stand: Min assist           General transfer comment:  cues for hand placement and LLE position    Ambulation/Gait Ambulation/Gait assistance: Min guard, Supervision Gait Distance (Feet): 110 Feet Assistive device: Rolling walker (2 wheels) Gait Pattern/deviations: Step-to pattern, Step-through pattern, Decreased stance time - left       General Gait Details: cues for sequence and RW position,  min/guard to supervision for balance and safety   Stairs             Wheelchair Mobility    Modified Rankin (Stroke Patients Only)       Balance                                            Cognition Arousal/Alertness: Awake/alert Behavior During Therapy: WFL for tasks assessed/performed Overall Cognitive Status: Within Functional Limits for tasks assessed                                          Exercises Total Joint Exercises Ankle Circles/Pumps: AROM, 10 reps, Both Quad Sets: AROM, Both, 10 reps Heel Slides: AROM, Left, AAROM, 10 reps Straight Leg Raises: AROM, Left, 10 reps Goniometric ROM: grossly 8 to 75 degrees left knee flexion    General Comments        Pertinent Vitals/Pain Pain Assessment Pain Assessment: 0-10 Pain Score: 4  Pain Location: left knee Pain Descriptors / Indicators: Aching, Grimacing, Sore Pain Intervention(s): Limited activity within patient's  tolerance, Monitored during session, Premedicated before session, Repositioned, Ice applied    Home Living                          Prior Function            PT Goals (current goals can now be found in the care plan section) Acute Rehab PT Goals Patient Stated Goal: home soon PT Goal Formulation: With patient Time For Goal Achievement: 11/28/21 Potential to Achieve Goals: Good Progress towards PT goals: Progressing toward goals    Frequency    7X/week      PT Plan Current plan remains appropriate    Co-evaluation              AM-PAC PT "6 Clicks" Mobility   Outcome Measure  Help  needed turning from your back to your side while in a flat bed without using bedrails?: A Little Help needed moving from lying on your back to sitting on the side of a flat bed without using bedrails?: A Little Help needed moving to and from a bed to a chair (including a wheelchair)?: A Little Help needed standing up from a chair using your arms (e.g., wheelchair or bedside chair)?: A Little Help needed to walk in hospital room?: A Little Help needed climbing 3-5 steps with a railing? : A Little 6 Click Score: 18    End of Session Equipment Utilized During Treatment: Gait belt Activity Tolerance: Patient tolerated treatment well;No increased pain Patient left: in chair;with call bell/phone within reach;with chair alarm set;with family/visitor present Nurse Communication: Mobility status PT Visit Diagnosis: Other abnormalities of gait and mobility (R26.89);Difficulty in walking, not elsewhere classified (R26.2)     Time: 0940-1007 PT Time Calculation (min) (ACUTE ONLY): 27 min  Charges:  $Gait Training: 8-22 mins $Therapeutic Exercise: 8-22 mins                     Baxter Flattery, PT  Acute Rehab Dept ([place]) [phone] Pager [phone]  11/22/2021    [facility] 11/22/2021, 10:38 AM

## 2021-11-22 NOTE — Progress Notes (Signed)
11/22/21 1300  PT Visit Information  Last PT Received On 11/22/21  Pt progressing, meeting goals. Reviewed KI use/precautions, sleeping positions, etc. Wife present for session. Pt is ready for d/c with family assist prn.  Assistance Needed +1  History of Present Illness 81 yo male s/p L TKA. PMH: R DA THA 2022, HTN, OA, GERD BPH, DDD, vocal cord leukoplakia, bil shoulder sugery.  Subjective Data  Patient Stated Goal home soon  Precautions  Precautions Fall;Knee  Precaution Comments IND SLRs  Required Braces or Orthoses Knee Immobilizer - Right  Knee Immobilizer - Right Discontinue once straight leg raise with < 10 degree lag  Restrictions  LLE Weight Bearing WBAT  Pain Assessment  Pain Assessment 0-10  Pain Score 3  Pain Location left knee  Pain Descriptors / Indicators Aching;Grimacing;Sore  Pain Intervention(s) Limited activity within patient's tolerance;Monitored during session;Premedicated before session;Repositioned  Cognition  Arousal/Alertness Awake/alert  Behavior During Therapy WFL for tasks assessed/performed  Overall Cognitive Status Within Functional Limits for tasks assessed  Bed Mobility  General bed mobility comments in recliner  Transfers  Overall transfer level Needs assistance  Equipment used Rolling walker (2 wheels)  Transfers Sit to/from Stand  Sit to Stand Min guard;Supervision  General transfer comment cues for hand placement and LLE position  Ambulation/Gait  Ambulation/Gait assistance Supervision  Gait Distance (Feet) 120 Feet  Assistive device Rolling walker (2 wheels)  Gait Pattern/deviations Step-to pattern;Step-through pattern;Decreased stance time - left  General Gait Details cues for sequence and RW position,  supervision for balance and safety  Stairs Yes  Stairs assistance Min guard;Min assist  Stair Management No rails;Forwards;With walker  Number of Stairs 3  General stair comments cues for sequence and RW position  PT - End of  Session  Equipment Utilized During Treatment Gait belt  Activity Tolerance Patient tolerated treatment well;No increased pain  Patient left in chair;with call bell/phone within reach;with family/visitor present  Nurse Communication Mobility status   PT - Assessment/Plan  PT Plan Current plan remains appropriate  PT Visit Diagnosis Other abnormalities of gait and mobility (R26.89);Difficulty in walking, not elsewhere classified (R26.2)  PT Frequency (ACUTE ONLY) 7X/week  Follow Up Recommendations Follow physician's recommendations for discharge plan and follow up therapies  Assistance recommended at discharge Intermittent Supervision/Assistance  Patient can return home with the following Help with stairs or ramp for entrance;Assist for transportation  PT equipment BSC/3in1 (requesting 3in1)  AM-PAC PT "6 Clicks" Mobility Outcome Measure (Version 2)  Help needed turning from your back to your side while in a flat bed without using bedrails? 3  Help needed moving from lying on your back to sitting on the side of a flat bed without using bedrails? 3  Help needed moving to and from a bed to a chair (including a wheelchair)? 3  Help needed standing up from a chair using your arms (e.g., wheelchair or bedside chair)? 3  Help needed to walk in hospital room? 3  Help needed climbing 3-5 steps with a railing?  3  6 Click Score 18  Consider Recommendation of Discharge To: Home with [facility]  PT Goal Progression  Progress towards PT goals Progressing toward goals  Acute Rehab PT Goals  PT Goal Formulation With patient  Time For Goal Achievement 11/28/21  Potential to Achieve Goals Good  PT Time Calculation  PT Start Time (ACUTE ONLY) 1212  PT Stop Time (ACUTE ONLY) 1240  PT Time Calculation (min) (ACUTE ONLY) 28 min  PT General Charges  $$  ACUTE PT VISIT 1 Visit  PT Treatments  $Gait Training 23-37 mins

## 2021-11-22 NOTE — TOC Transition Note (Signed)
Transition of Care [facility]) - CM/SW Discharge Note   Patient Details  Name: Adrian Davis MRN: [id number] Date of Birth: 03/01/1941  [facility]) CM/SW Contact:  Lennart Pall, LCSW Phone Number: 11/22/2021, 12:54 PM   Clinical Narrative:    Met with pt and confirming he has all needed DME at home.  Plan for OPPT at [facility]).  No further needs.   Final next level of care: OP Rehab Barriers to Discharge: No Barriers Identified   Patient Goals and CMS Choice Patient states their goals for this hospitalization and ongoing recovery are:: return home      Discharge Placement                       Discharge Plan and Services                DME Arranged: N/A DME Agency: NA       HH Arranged: NA HH Agency: NA        Social Determinants of Health (SDOH) Interventions     Readmission Risk Interventions No flowsheet data found.

## 2021-11-22 NOTE — Progress Notes (Signed)
Provided discharge education/instructions, all questions and concerns addressed. Pt not ina cute distress, discharged home with belongings accompanied by wife. 

## 2021-11-23 NOTE — Discharge Summary (Signed)
Patient ID: Adrian Davis MRN: [id number] DOB/AGE: 1941/06/06 81 y.o.  Admit date: 11/21/2021 Discharge date: 11/22/2021  Admission Diagnoses:  Principal Problem:   OA (osteoarthritis) of knee Active Problems:   Primary osteoarthritis of left knee   Discharge Diagnoses:  Same  Past Medical History:  Diagnosis Date   Abnormal echocardiogram Jan 2012   grade 1 diastolic dysfunction, normal EF, mild LVH   Anemia    Basal cell carcinoma 04/08/2019   left nose inferior to nasal alar groove. EDC: 05/20/2019   Bell palsy 10/2010   presented with facial numbness   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    Degenerative disk disease    GERD (gastroesophageal reflux disease)    History of kidney stones    Hyperlipidemia    Hypertension    dr [name]   Kidney cysts    Lumbar stenosis    Nephrolithiasis    Normal nuclear stress test 2008   Osteoarthritis    Skin cancer    Vocal cord leukoplakia    Dr Erik Obey    Surgeries: Procedure(s): TOTAL KNEE ARTHROPLASTY on 11/21/2021   Consultants:   Discharged Condition: Improved  Hospital Course: Adrian Davis is an 81 y.o. male who was admitted 11/21/2021 for operative treatment ofOA (osteoarthritis) of knee. Patient has severe unremitting pain that affects sleep, daily activities, and work/hobbies. After pre-op clearance the patient was taken to the operating room on 11/21/2021 and underwent  Procedure(s): TOTAL KNEE ARTHROPLASTY.    Patient was given perioperative antibiotics:  Anti-infectives (From admission, onward)    Start     Dose/Rate Route Frequency Ordered Stop   11/21/21 1530  ceFAZolin (ANCEF) IVPB 2g/100 mL premix        2 g 200 mL/hr over 30 Minutes Intravenous Every 6 hours 11/21/21 1155 11/21/21 2222   11/21/21 0730  ceFAZolin (ANCEF) IVPB 2g/100 mL premix        2 g 200 mL/hr over 30 Minutes Intravenous On call to O.R. 11/21/21 8338 11/21/21 0935        Patient was given sequential  compression devices, Miah ambulation, and chemoprophylaxis to prevent DVT.  Patient benefited maximally from hospital stay and there were no complications.    Recent vital signs: Patient Vitals for the past 24 hrs:  BP Temp Temp src Pulse Resp SpO2  11/22/21 1304 138/67 98 F (36.7 C) Oral 79 15 97 %     Recent laboratory studies:  Recent Labs    11/22/21 0317  WBC 13.7*  HGB 11.0*  HCT 34.4*  PLT 193  NA 137  K 4.4  CL 105  CO2 25  BUN 16  CREATININE 0.78  GLUCOSE 133*  CALCIUM 8.8*     Discharge Medications:   Allergies as of 11/22/2021   No Known Allergies      Medication List     STOP taking these medications    aspirin EC 81 MG tablet   B-12 1000 MCG Subl   diclofenac Sodium 1 % Gel Commonly known as: VOLTAREN   naproxen sodium 220 MG tablet Commonly known as: ALEVE       TAKE these medications    acetaminophen 650 MG CR tablet Commonly known as: TYLENOL Take 1,300 mg by mouth every 8 (eight) hours as needed for pain. Notes to patient: Last dose given 01/31 09:22am   amLODipine 2.5 MG tablet Commonly known as: NORVASC Take 1 tablet (2.5 mg total) by mouth 2 (two) times daily.  benazepril 20 MG tablet Commonly known as: LOTENSIN TAKE 1 TABLET BY MOUTH TWICE A DAY Notes to patient: Resume home regimen   docusate sodium 100 MG capsule Commonly known as: Colace Take 1 capsule (100 mg total) by mouth 2 (two) times daily. What changed:  when to take this reasons to take this   famotidine 20 MG tablet Commonly known as: PEPCID TAKE 1 TABLET(20 MG) BY MOUTH TWICE DAILY What changed:  how much to take how to take this when to take this additional instructions   hydrochlorothiazide 25 MG tablet Commonly known as: HYDRODIURIL Take 1 tablet (25 mg total) by mouth daily. TAKE 1 TABLET BY MOUTH ONCE A DAY AS NEEDED FOR SWELLING   ketoconazole 2 % cream Commonly known as: NIZORAL Apply 1 application topically daily as needed for  irritation. Notes to patient: Resume home regimen   loratadine 10 MG tablet Commonly known as: CLARITIN Take 10 mg by mouth daily. Notes to patient: Resume home regimen   methocarbamol 500 MG tablet Commonly known as: ROBAXIN Take 1 tablet (500 mg total) by mouth every 6 (six) hours as needed for muscle spasms. Notes to patient: Last dose given 01/31 09:24am   oxyCODONE 5 MG immediate release tablet Commonly known as: Oxy IR/ROXICODONE Take 1-2 tablets (5-10 mg total) by mouth every 6 (six) hours as needed for severe pain. Notes to patient: Last dose given 01/31 09:25am   pantoprazole 40 MG tablet Commonly known as: PROTONIX Take 1 tablet (40 mg total) by mouth 2 (two) times daily. What changed: when to take this   polyethylene glycol powder 17 GM/SCOOP powder Commonly known as: GLYCOLAX/MIRALAX TAKE 17 GRAMS BY MOUTH TWICE DAILY What changed: See the new instructions. Notes to patient: Resume home regimen   potassium chloride 10 MEQ tablet Commonly known as: KLOR-CON TAKE 1 TABLET BY MOUTH ONCE A DAY   rivaroxaban 10 MG Tabs tablet Commonly known as: XARELTO Take 1 tablet (10 mg total) by mouth daily with breakfast for 20 days. Then resume one 81 mg aspirin once a day.   sildenafil 20 MG tablet Commonly known as: REVATIO Take 2 to 5 tablets as needed Notes to patient: Resume home regimen   simvastatin 10 MG tablet Commonly known as: ZOCOR TAKE 1 TABLET BY MOUTH ONCE A DAY AT SIXIN THE EVENING Notes to patient: Resume home regimen   tamsulosin 0.4 MG Caps capsule Commonly known as: FLOMAX TAKE 1 CAPSULE BY MOUTH ONCE DAILY AFTERSUPPER   traMADol 50 MG tablet Commonly known as: ULTRAM Take 1-2 tablets (50-100 mg total) by mouth every 6 (six) hours as needed for moderate pain. Notes to patient: Last dose given 01/30 03:39pm               Discharge Care Instructions  (From admission, onward)           Start     Ordered   11/22/21 0000  Weight  bearing as tolerated        11/22/21 0728   11/22/21 0000  Change dressing       Comments: You may remove the bulky bandage (ACE wrap and gauze) two days after surgery. You will have an adhesive waterproof bandage underneath. Leave this in place until your first follow-up appointment.   11/22/21 0728            Diagnostic Studies: DG Chest 2 View  Result Date: 11/11/2021 CLINICAL DATA:  Preoperative evaluation EXAM: CHEST - 2 VIEW COMPARISON:  11/02/2020 FINDINGS:  Cardiac silhouette and mediastinal contours are unchanged and within normal limits with calcification again seen within the aortic arch and mildly tortuous descending thoracic aorta. The lungs are clear. Moderate hyperinflation. No pleural effusion or pneumothorax. Mild-to-moderate multilevel degenerative disc changes of the thoracic spine. IMPRESSION: No active cardiopulmonary disease. Electronically Signed   By: Yvonne Kendall M.D.   On: 11/11/2021 17:32    Disposition: Discharge disposition: 01-Home or Self Care       Discharge Instructions     Call MD / Call 911   Complete by: As directed    If you experience chest pain or shortness of breath, CALL 911 and be transported to the hospital emergency room.  If you develope a fever above 101 F, pus (white drainage) or increased drainage or redness at the wound, or calf pain, call your surgeon's office.   Change dressing   Complete by: As directed    You may remove the bulky bandage (ACE wrap and gauze) two days after surgery. You will have an adhesive waterproof bandage underneath. Leave this in place until your first follow-up appointment.   Constipation Prevention   Complete by: As directed    Drink plenty of fluids.  Prune juice may be helpful.  You may use a stool softener, such as Colace (over the counter) 100 mg twice a day.  Use MiraLax (over the counter) for constipation as needed.   Diet - low sodium heart healthy   Complete by: As directed    Do not put a pillow  under the knee. Place it under the heel.   Complete by: As directed    Driving restrictions   Complete by: As directed    No driving for two weeks   Post-operative opioid taper instructions:   Complete by: As directed    POST-OPERATIVE OPIOID TAPER INSTRUCTIONS: It is important to wean off of your opioid medication as soon as possible. If you do not need pain medication after your surgery it is ok to stop day one. Opioids include: Codeine, Hydrocodone(Norco, Vicodin), Oxycodone(Percocet, oxycontin) and hydromorphone amongst others.  Long term and even short term use of opiods can cause: Increased pain response Dependence Constipation Depression Respiratory depression And more.  Withdrawal symptoms can include Flu like symptoms Nausea, vomiting And more Techniques to manage these symptoms Hydrate well Eat regular healthy meals Stay active Use relaxation techniques(deep breathing, meditating, yoga) Do Not substitute Alcohol to help with tapering If you have been on opioids for less than two weeks and do not have pain than it is ok to stop all together.  Plan to wean off of opioids This plan should start within one week post op of your joint replacement. Maintain the same interval or time between taking each dose and first decrease the dose.  Cut the total daily intake of opioids by one tablet each day Next start to increase the time between doses. The last dose that should be eliminated is the evening dose.      TED hose   Complete by: As directed    Use stockings (TED hose) for three weeks on both leg(s).  You may remove them at night for sleeping.   Weight bearing as tolerated   Complete by: As directed         Follow-up Information     [name], MD. Go on 12/06/2021.   Specialty: Orthopedic Surgery Why: You are scheduled for a follow up appointment on 12-06-21 at 3:00 pm. Contact information: [address]  [address] [place]  [address] [phone]                  Signed: Theresa Duty 11/23/2021, 9:55 AM

## 2021-11-24 DIAGNOSIS — M25562 Pain in left knee: Secondary | ICD-10-CM | POA: Diagnosis not present

## 2021-11-24 DIAGNOSIS — M25662 Stiffness of left knee, not elsewhere classified: Secondary | ICD-10-CM | POA: Diagnosis not present

## 2021-11-29 DIAGNOSIS — M25562 Pain in left knee: Secondary | ICD-10-CM | POA: Diagnosis not present

## 2021-11-29 DIAGNOSIS — M25662 Stiffness of left knee, not elsewhere classified: Secondary | ICD-10-CM | POA: Diagnosis not present

## 2021-12-01 DIAGNOSIS — M25562 Pain in left knee: Secondary | ICD-10-CM | POA: Diagnosis not present

## 2021-12-01 DIAGNOSIS — M25662 Stiffness of left knee, not elsewhere classified: Secondary | ICD-10-CM | POA: Diagnosis not present

## 2021-12-08 DIAGNOSIS — M25562 Pain in left knee: Secondary | ICD-10-CM | POA: Diagnosis not present

## 2021-12-08 DIAGNOSIS — M25662 Stiffness of left knee, not elsewhere classified: Secondary | ICD-10-CM | POA: Diagnosis not present

## 2021-12-09 DIAGNOSIS — M25662 Stiffness of left knee, not elsewhere classified: Secondary | ICD-10-CM | POA: Diagnosis not present

## 2021-12-09 DIAGNOSIS — M25562 Pain in left knee: Secondary | ICD-10-CM | POA: Diagnosis not present

## 2021-12-12 DIAGNOSIS — M25562 Pain in left knee: Secondary | ICD-10-CM | POA: Diagnosis not present

## 2021-12-12 DIAGNOSIS — M25662 Stiffness of left knee, not elsewhere classified: Secondary | ICD-10-CM | POA: Diagnosis not present

## 2021-12-14 DIAGNOSIS — M25662 Stiffness of left knee, not elsewhere classified: Secondary | ICD-10-CM | POA: Diagnosis not present

## 2021-12-14 DIAGNOSIS — M25562 Pain in left knee: Secondary | ICD-10-CM | POA: Diagnosis not present

## 2021-12-20 DIAGNOSIS — M25562 Pain in left knee: Secondary | ICD-10-CM | POA: Diagnosis not present

## 2021-12-20 DIAGNOSIS — M25662 Stiffness of left knee, not elsewhere classified: Secondary | ICD-10-CM | POA: Diagnosis not present

## 2021-12-22 DIAGNOSIS — M25662 Stiffness of left knee, not elsewhere classified: Secondary | ICD-10-CM | POA: Diagnosis not present

## 2021-12-22 DIAGNOSIS — M25562 Pain in left knee: Secondary | ICD-10-CM | POA: Diagnosis not present

## 2021-12-27 DIAGNOSIS — M25562 Pain in left knee: Secondary | ICD-10-CM | POA: Diagnosis not present

## 2021-12-27 DIAGNOSIS — M25662 Stiffness of left knee, not elsewhere classified: Secondary | ICD-10-CM | POA: Diagnosis not present

## 2021-12-28 DIAGNOSIS — M25551 Pain in right hip: Secondary | ICD-10-CM | POA: Diagnosis not present

## 2021-12-28 DIAGNOSIS — Z4789 Encounter for other orthopedic aftercare: Secondary | ICD-10-CM | POA: Diagnosis not present

## 2021-12-30 DIAGNOSIS — M25662 Stiffness of left knee, not elsewhere classified: Secondary | ICD-10-CM | POA: Diagnosis not present

## 2021-12-30 DIAGNOSIS — M25562 Pain in left knee: Secondary | ICD-10-CM | POA: Diagnosis not present

## 2022-01-02 DIAGNOSIS — M25662 Stiffness of left knee, not elsewhere classified: Secondary | ICD-10-CM | POA: Diagnosis not present

## 2022-01-02 DIAGNOSIS — M25562 Pain in left knee: Secondary | ICD-10-CM | POA: Diagnosis not present

## 2022-01-06 ENCOUNTER — Other Ambulatory Visit (HOSPITAL_COMMUNITY): Payer: Self-pay | Admitting: Orthopedic Surgery

## 2022-01-06 ENCOUNTER — Ambulatory Visit (HOSPITAL_COMMUNITY)
Admission: RE | Admit: 2022-01-06 | Discharge: 2022-01-06 | Disposition: A | Discharge status: Home / Self Care | Payer: Medicare Other | Source: Ambulatory Visit | Attending: Orthopedic Surgery | Admitting: Orthopedic Surgery

## 2022-01-06 ENCOUNTER — Other Ambulatory Visit: Payer: Self-pay

## 2022-01-06 DIAGNOSIS — R52 Pain, unspecified: Secondary | ICD-10-CM | POA: Insufficient documentation

## 2022-01-24 ENCOUNTER — Telehealth: Payer: Self-pay

## 2022-01-24 NOTE — Progress Notes (Signed)
? ? ?Chronic Care Management ?Pharmacy Assistant  ? ?Name: Adrian Davis  MRN: [id number] DOB: 10-23-1941 ? ?Reason for Encounter: CCM (General Adherence) ?  ?Recent office visits:  ?11/11/21 [name], MD Pre-op Exam: Chest xray ordered.  ? ?Recent consult visits:  ?11/03/21 [name], PA (Ortho): Osteoarthritis of left knee joint. No other info.  ?10/27/21 [name], PA (ENT): Bilateral Impacted Cerumen Procedure: Cerumen Removal ? ?After Care Visits: ?[name] (Orthopaedic Surgery) No information.  ?02/14, 03/08, 03/21 ? ?Physical Therapy Visits: ?11/24/21 -01/06/22 13 visits ? ?Hospital visits:  ?11/21/21 [facility] ?Left Total Knee Arthroplasty ?Start: Methocarbamol 500 mg ?Start: Oxycodone HCI 5-10 mg ?Start: Rivaroxaban 10 mg ?Start: Tramadol HCI 50-100 mg ?Stop: Asprin EC 81 mg ?Stop: B-12 1000 mcg ?Stop: Diclofenac Sodium 1% Gel ?Stop: Naproxen Sodium 220 mg tablet ? ?Medications: ?Outpatient Encounter Medications as of 01/24/2022  ?Medication Sig Note  ? acetaminophen (TYLENOL) 650 MG CR tablet Take 1,300 mg by mouth every 8 (eight) hours as needed for pain.   ? amLODipine (NORVASC) 2.5 MG tablet Take 1 tablet (2.5 mg total) by mouth 2 (two) times daily.   ? benazepril (LOTENSIN) 20 MG tablet TAKE 1 TABLET BY MOUTH TWICE A DAY   ? docusate sodium (COLACE) 100 MG capsule Take 1 capsule (100 mg total) by mouth 2 (two) times daily. (Patient taking differently: Take 100 mg by mouth 2 (two) times daily as needed for moderate constipation.) 11/04/2021: Pt does take 1 every night at bedtime   ? famotidine (PEPCID) 20 MG tablet TAKE 1 TABLET(20 MG) BY MOUTH TWICE DAILY (Patient taking differently: Take 20 mg by mouth at bedtime.)   ? hydrochlorothiazide (HYDRODIURIL) 25 MG tablet Take 1 tablet (25 mg total) by mouth daily. TAKE 1 TABLET BY MOUTH ONCE A DAY AS NEEDED FOR SWELLING   ? ketoconazole (NIZORAL) 2 % cream Apply 1 application topically daily as needed for irritation. 11/04/2021: Wife's  Rx  ? loratadine (CLARITIN) 10 MG tablet Take 10 mg by mouth daily.   ? methocarbamol (ROBAXIN) 500 MG tablet Take 1 tablet (500 mg total) by mouth every 6 (six) hours as needed for muscle spasms.   ? oxyCODONE (OXY IR/ROXICODONE) 5 MG immediate release tablet Take 1-2 tablets (5-10 mg total) by mouth every 6 (six) hours as needed for severe pain.   ? pantoprazole (PROTONIX) 40 MG tablet Take 1 tablet (40 mg total) by mouth 2 (two) times daily. (Patient taking differently: Take 40 mg by mouth every morning.)   ? polyethylene glycol powder (GLYCOLAX/MIRALAX) 17 GM/SCOOP powder TAKE 17 GRAMS BY MOUTH TWICE DAILY (Patient taking differently: Take 17 g by mouth at bedtime.)   ? potassium chloride (KLOR-CON) 10 MEQ tablet TAKE 1 TABLET BY MOUTH ONCE A DAY   ? sildenafil (REVATIO) 20 MG tablet Take 2 to 5 tablets as needed 10/29/2020: ON HOLD   ? simvastatin (ZOCOR) 10 MG tablet TAKE 1 TABLET BY MOUTH ONCE A DAY AT [place] THE EVENING   ? tamsulosin (FLOMAX) 0.4 MG CAPS capsule TAKE 1 CAPSULE BY MOUTH ONCE DAILY AFTERSUPPER   ? traMADol (ULTRAM) 50 MG tablet Take 1-2 tablets (50-100 mg total) by mouth every 6 (six) hours as needed for moderate pain.   ? ?No facility-administered encounter medications on file as of 01/24/2022.  ? ?Contacted Tranell F Redner on 01/24/2022 for general disease state and medication adherence call.  ? ?Patient is not more than 5 days past due for refill on the following medications per  chart history: ? ?[facility] Medications: ?Medication Name/mg Last Fill Days Supply ?Benazepril 20 mg  10/26/2021 90 ?Simvastatin 10 mg  10/26/2021 90 ? ?What concerns do you have about your medications? Patient stated he does not have any at [facility].  ? ?The patient denies side effects with their medications.  ? ?How often do you forget or accidentally miss a dose? Never ? ?Do you use a pillbox? Yes ? ?Are you having any problems getting your medications from your pharmacy? No ? ?Has the cost of your medications been a  concern? No ? ?Since last visit with CPP, no interventions have been made.  ? ?The patient has not had an ED visit since last contact.  ? ?The patient denies problems with their health. Patient reported everything with his knee replacement is going well and he is not having any issues.  ? ?Patient denies concerns or questions for [name], PharmD at this time.  ? ?Care Gaps: ?Annual wellness visit in last year? No ?Most Recent BP reading: 138/67 on 11/22/2021 ? ?Upcoming appointments: ?No appointments scheduled within the next 30 days. ? ?[name], CPP notified ? ?Marijean Niemann, RMA ?Clinical Pharmacy Assistant ?[phone] ? ? ?

## 2022-02-20 ENCOUNTER — Other Ambulatory Visit: Payer: Self-pay | Admitting: Cardiology

## 2022-02-23 DIAGNOSIS — M5416 Radiculopathy, lumbar region: Secondary | ICD-10-CM | POA: Diagnosis not present

## 2022-02-23 DIAGNOSIS — M4316 Spondylolisthesis, lumbar region: Secondary | ICD-10-CM | POA: Diagnosis not present

## 2022-02-23 DIAGNOSIS — M5441 Lumbago with sciatica, right side: Secondary | ICD-10-CM | POA: Diagnosis not present

## 2022-02-23 DIAGNOSIS — G8929 Other chronic pain: Secondary | ICD-10-CM | POA: Diagnosis not present

## 2022-02-28 DIAGNOSIS — Z135 Encounter for screening for eye and ear disorders: Secondary | ICD-10-CM | POA: Diagnosis not present

## 2022-02-28 DIAGNOSIS — M5441 Lumbago with sciatica, right side: Secondary | ICD-10-CM | POA: Diagnosis not present

## 2022-02-28 DIAGNOSIS — M48061 Spinal stenosis, lumbar region without neurogenic claudication: Secondary | ICD-10-CM | POA: Diagnosis not present

## 2022-02-28 DIAGNOSIS — M47816 Spondylosis without myelopathy or radiculopathy, lumbar region: Secondary | ICD-10-CM | POA: Diagnosis not present

## 2022-02-28 DIAGNOSIS — M5127 Other intervertebral disc displacement, lumbosacral region: Secondary | ICD-10-CM | POA: Diagnosis not present

## 2022-03-01 ENCOUNTER — Ambulatory Visit (INDEPENDENT_AMBULATORY_CARE_PROVIDER_SITE_OTHER): Payer: Medicare Other | Admitting: Family Medicine

## 2022-03-01 ENCOUNTER — Encounter: Payer: Self-pay | Admitting: Family Medicine

## 2022-03-01 VITALS — BP 140/78 | HR 74 | Temp 97.5°F | Ht 67.5 in | Wt 177.5 lb

## 2022-03-01 DIAGNOSIS — S40261A Insect bite (nonvenomous) of right shoulder, initial encounter: Secondary | ICD-10-CM | POA: Diagnosis not present

## 2022-03-01 DIAGNOSIS — W57XXXA Bitten or stung by nonvenomous insect and other nonvenomous arthropods, initial encounter: Secondary | ICD-10-CM | POA: Diagnosis not present

## 2022-03-01 DIAGNOSIS — S80261A Insect bite (nonvenomous), right knee, initial encounter: Secondary | ICD-10-CM | POA: Diagnosis not present

## 2022-03-01 MED ORDER — DOXYCYCLINE HYCLATE 100 MG PO TABS: 100.0000 mg | ORAL_TABLET | Freq: Two times a day (BID) | ORAL | 0 refills | Status: DC

## 2022-03-01 NOTE — Assessment & Plan Note (Signed)
R flank tick bite healing well - rec cortizone-10 OTC.  ?R popliteal tick jaw removed. Attached prolonged period of time. Will provide with WASP doxycycline 10d course with instructions when to fill.  ?Pt/wife agree with plan.  ?

## 2022-03-01 NOTE — Patient Instructions (Signed)
Tick jaw removed from right knee site.  ?Doxycycline prescription printed out today - fill if any tick borne illness symptoms develop over the next week.  ? ?Tick Bite Information, Adult ?Ticks are insects that draw blood for food. Most ticks live in shrubs and grassy and wooded areas. They climb onto people and animals that brush against the leaves and grasses that they rest on. Then they bite, attaching themselves to the skin. Most ticks are harmless, but some ticks may carry germs that can spread to a person through a bite and cause a disease. To reduce your risk of getting a disease from a tick bite, make sure you: ?Take steps to prevent tick bites. ?Check for ticks after being outdoors where ticks live. ?Watch for symptoms of disease if a tick attached to you or if you suspect a tick bite. ?How can I prevent tick bites? ?Take these steps to help prevent tick bites when you go outdoors in an area where ticks live: ?Use insect repellent ?Use insect repellent that has DEET (20% or higher), picaridin, or IR3535 in it. Follow the instructions on the label. Use these products on: ?Bare skin. ?The top of your boots. ?Your pant legs. ?Your sleeve cuffs. ?For insect repellent that contains permethrin, follow the instructions on the label. Use these products on: ?Clothing. ?Boots. ?Outdoor gear. ?Tents. ?When you are outside ?Wear protective clothing. Long sleeves and long pants offer the best protection from ticks. ?Wear light-colored clothing so you can see ticks more easily. ?Tuck your pant legs into your socks. ?If you go walking on a trail, stay in the middle of the trail so your skin, hair, and clothing do not touch the bushes. ?Avoid walking through areas with long grass. ?Check for ticks on your clothing, hair, and skin often while you are outside, and check again before you go inside. Make sure to check the scalp, neck, armpits, waist, groin, and joint areas. These are the spots where ticks attach themselves  most often. ?When you go indoors ?Check your clothing for ticks. Tumble dry clothes in a dryer on high heat for at least 10 minutes. If clothes are damp, additional time may be needed. If clothes require washing, use hot water. ?Examine gear and pets. ?Shower soon after being outdoors. ?Check your body for ticks. Conduct a full body check using a mirror. ?What is the proper way to remove a tick? ?If you find a tick on your body, remove it as soon as possible. Removing a tick sooner can prevent germs from passing to your body. Do not remove the tick with your bare fingers. To remove a tick that is crawling on your skin but has not bitten, use either of these methods: ?Go outdoors and brush the tick off. ?Remove the tick with tape or a lint roller. ?To remove a tick that is attached to your skin: ?Wash your hands. If you have latex gloves, put them on. ?Use fine-tipped tweezers, curved forceps, or a tick-removal tool to gently grasp the tick as close to your skin and the tick's head as possible. ?Gently pull with a steady, upward, even pressure until the tick lets go. ?When removing the tick: ?Take care to keep the tick's head attached to its body. ?Do not twist or jerk the tick. This can make the tick's head or mouth parts break off and remain in the skin. ?Do not squeeze or crush the tick's body. This could force disease-carrying fluids from the tick into your body. ?Do  not try to remove a tick with heat, alcohol, petroleum jelly, or fingernail polish. Using these methods can cause the tick to salivate and regurgitate into your bloodstream, increasing your risk of getting a disease. ?What should I do after removing a tick? ?Dispose of the tick. Do not crush a tick with your fingers. ?Clean the bite area and your hands with soap and water, rubbing alcohol, or an iodine scrub. ?If an antiseptic cream or ointment is available, apply a small amount to the bite site. ?Wash and disinfect any instruments that you used to  remove the tick. ?How should I dispose of a tick? ?To dispose of a live tick, use one of these methods: ?Place it in rubbing alcohol. ?Place it in a sealed bag or container. ?Wrap it tightly in tape. ?Flush it down the toilet. ?Contact a health care provider if: ?You have symptoms of a disease after a tick bite. Symptoms of a tick-borne disease can occur from moments after the tick bites to 30 days after a tick is removed. Symptoms include: ?Fever or chills. ?Any of these signs in the bite area: ?A red rash that makes a circle (bull's-eye rash) in the bite area. ?Redness and swelling. ?Headache. ?Muscle, joint, or bone pain. ?Abnormal tiredness. ?Numbness in your legs or difficulty walking or moving your legs. ?Tender, swollen lymph glands. ?A part of a tick breaks off and gets stuck in your skin. ?Get help right away if: ?You are not able to remove a tick. ?You experience muscle weakness or paralysis. ?Your symptoms get worse or you experience new symptoms. ?You find an engorged tick on your skin and you are in an area where disease from ticks is a high risk. ?Summary ?Ticks may carry germs that can spread to a person through a bite and cause a disease. ?Wear protective clothing and use insect repellent to prevent tick bites. Follow the instructions on the label. ?If you find a tick on your body, remove it as soon as possible. If the tick is attached, do not try to remove with heat, alcohol, petroleum jelly, or fingernail polish. ?Remove the attached tick using fine-tipped tweezers, curved forceps, or a tick-removal tool. Gently pull with steady, upward, even pressure until the tick lets go. Do not twist or jerk the tick. Do not squeeze or crush the tick's body. ?If you have symptoms of a disease after being bitten by a tick, contact a health care provider. ?This information is not intended to replace advice given to you by your health care provider. Make sure you discuss any questions you have with your health  care provider. ?Document Revised: 10/06/2019 Document Reviewed: 10/06/2019 ?Elsevier Patient Education ? [place]. ? ?

## 2022-03-01 NOTE — Progress Notes (Signed)
? ? Patient ID: Adrian Davis, male    DOB: 1941-08-08, 81 y.o.   MRN: [id number] ? ?This visit was conducted in person. ? ?BP 140/78   Pulse 74   Temp (!) 97.5 ?F (36.4 ?C) (Temporal)   Ht 5' 7.5" (1.715 m)   Wt 177 lb 8 oz (80.5 kg)   SpO2 97%   BMI 27.39 kg/m?   ? ?CC: check tick bites ?Subjective:  ? ?HPI: ?Adrian Davis is a 81 y.o. male presenting on 03/01/2022 for Insect Bite (C/o 2 tick bites, located posterior R knee and R shoulder.  Noticed 02/26/22.  Tick removed from shoulder but one located behind knee may have left head. Pt accompanied by wife, Adrian Davis. ) ? ? ?2 tiny tick bites noted 02/26/2022. Posterior to R knee and R shoulder. R knee one may not have been fully removed. Unsure how long attached but shoulder one was full of blood.  ? ?Denies fever, chills, abd pain, nausea, new rash, joint pains, or headache.  ?   ? ?Relevant past medical, surgical, family and social history reviewed and updated as indicated. Interim medical history since our last visit reviewed. ?Allergies and medications reviewed and updated. ?Outpatient Medications Prior to Visit  ?Medication Sig Dispense Refill  ? acetaminophen (TYLENOL) 650 MG CR tablet Take 1,300 mg by mouth every 8 (eight) hours as needed for pain.    ? amLODipine (NORVASC) 2.5 MG tablet Take 1 tablet (2.5 mg total) by mouth 2 (two) times daily. (Patient taking differently: Take 2.5 mg by mouth 2 (two) times daily. Takes 2 tablets in morning and 1 in evening.) 180 tablet 3  ? benazepril (LOTENSIN) 20 MG tablet TAKE 1 TABLET BY MOUTH TWICE A DAY 180 tablet 2  ? docusate sodium (COLACE) 100 MG capsule Take 1 capsule (100 mg total) by mouth 2 (two) times daily. (Patient taking differently: Take 100 mg by mouth 2 (two) times daily as needed for moderate constipation.) 180 capsule 3  ? famotidine (PEPCID) 20 MG tablet TAKE 1 TABLET(20 MG) BY MOUTH TWICE DAILY (Patient taking differently: Take 20 mg by mouth at bedtime.) 180 tablet 0  ? ketoconazole (NIZORAL) 2 %  cream Apply 1 application topically daily as needed for irritation.    ? loratadine (CLARITIN) 10 MG tablet Take 10 mg by mouth daily.    ? pantoprazole (PROTONIX) 40 MG tablet Take 1 tablet (40 mg total) by mouth 2 (two) times daily. (Patient taking differently: Take 40 mg by mouth every morning.) 180 tablet 2  ? polyethylene glycol powder (GLYCOLAX/MIRALAX) 17 GM/SCOOP powder TAKE 17 GRAMS BY MOUTH TWICE DAILY (Patient taking differently: Take 17 g by mouth at bedtime.) 510 g 2  ? potassium chloride (KLOR-CON) 10 MEQ tablet TAKE 1 TABLET BY MOUTH ONCE A DAY 90 tablet 3  ? simvastatin (ZOCOR) 10 MG tablet TAKE 1 TABLET BY MOUTH ONCE A DAY AT [facility] THE EVENING 90 tablet 3  ? tamsulosin (FLOMAX) 0.4 MG CAPS capsule TAKE 1 CAPSULE BY MOUTH ONCE DAILY AFTERSUPPER 90 capsule 3  ? traMADol (ULTRAM) 50 MG tablet Take 1-2 tablets (50-100 mg total) by mouth every 6 (six) hours as needed for moderate pain. 40 tablet 0  ? hydrochlorothiazide (HYDRODIURIL) 25 MG tablet Take 1 tablet (25 mg total) by mouth daily. TAKE 1 TABLET BY MOUTH ONCE A DAY AS NEEDED FOR SWELLING (Patient not taking: Reported on 03/01/2022) 90 tablet 1  ? sildenafil (REVATIO) 20 MG tablet Take 2 to 5 tablets  as needed (Patient not taking: Reported on 03/01/2022) 50 tablet 3  ? methocarbamol (ROBAXIN) 500 MG tablet Take 1 tablet (500 mg total) by mouth every 6 (six) hours as needed for muscle spasms. 40 tablet 0  ? oxyCODONE (OXY IR/ROXICODONE) 5 MG immediate release tablet Take 1-2 tablets (5-10 mg total) by mouth every 6 (six) hours as needed for severe pain. 42 tablet 0  ? ?No facility-administered medications prior to visit.  ?  ? ?Per HPI unless specifically indicated in ROS section below ?Review of Systems ? ?Objective:  ?BP 140/78   Pulse 74   Temp (!) 97.5 ?F (36.4 ?C) (Temporal)   Ht 5' 7.5" (1.715 m)   Wt 177 lb 8 oz (80.5 kg)   SpO2 97%   BMI 27.39 kg/m?   ?Wt Readings from Last 3 Encounters:  ?03/01/22 177 lb 8 oz (80.5 kg)  ?11/21/21 173  lb 9.6 oz (78.7 kg)  ?11/14/21 173 lb 9.6 oz (78.7 kg)  ?  ?  ?Physical Exam ?Vitals and nursing note reviewed.  ?Constitutional:   ?   Appearance: Normal appearance. He is not ill-appearing.  ?Skin: ?   General: Skin is warm and dry.  ?   Findings: No erythema or rash.  ?   Comments:  ?Tick bite site to right flank with mild swelling surrounding site without residual tick part ?Tick bite site to right popliteal area with piece of tick jaw still attached   ?Neurological:  ?   Mental Status: He is alert.  ?Psychiatric:     ?   Mood and Affect: Mood normal.     ?   Behavior: Behavior normal.  ? ?Tick removal procedure:  ?Area cleaned with alcohol pad, tick jaw removed with 25g needle, wound dressed with antibiotic ointment and bandaid, patient tolerated well.  ? ?   ? ?Assessment & Plan:  ? ?Problem List Items Addressed This Visit   ? ? Tick bites - Primary  ?  R flank tick bite healing well - rec cortizone-10 OTC.  ?R popliteal tick jaw removed. Attached prolonged period of time. Will provide with WASP doxycycline 10d course with instructions when to fill.  ?Pt/wife agree with plan.  ? ?  ?  ?  ? ?Meds ordered this encounter  ?Medications  ? doxycycline (VIBRA-TABS) 100 MG tablet  ?  Sig: Take 1 tablet (100 mg total) by mouth 2 (two) times daily.  ?  Dispense:  20 tablet  ?  Refill:  0  ? ?No orders of the defined types were placed in this encounter. ? ? ? ?Patient instructions: ?Tick jaw removed from right knee site.  ?Doxycycline prescription printed out today - fill if any tick borne illness symptoms develop over the next week.  ? ?Follow up plan: ?Return if symptoms worsen or fail to improve. ? ?Adrian Bush, MD   ?

## 2022-03-07 ENCOUNTER — Telehealth: Payer: Self-pay | Admitting: Family Medicine

## 2022-03-07 NOTE — Telephone Encounter (Signed)
Left message for patient to call back and schedule Medicare Annual Wellness Visit (AWV) either virtually or phone ? ? ?Last AWV ;01/03/17 ? please schedule at anytime with health coach ? ?I left my direct # [phone] ?

## 2022-03-07 NOTE — Progress Notes (Signed)
Adrian Davis Date of Birth: [birth year] Medical Record #[id number]  History of Present Illness: Adrian Davis is seen today for pre op clearance for left TKR. He has a history of HTN and HLD. No known CAD. He had a normal Adenosine Myoview back in 2008. His last echo was in January of 2012 showing grade 1 diastolic dysfunction, mild LVH and a normal EF.    He  underwent right THR on 11/10/20 by [name]. 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 He has spinal stenosis. Has seen Dr Arnoldo Morale and surgery recommended for this as well. Patient doesn't want a plate placed because he was told he wouldn't be able to tie his shoes.   He did undergo a left TKR on 11/21/21.   He is doing well from a cardiac standpoint.  Denies any chest pain, SOB, palpitations, dizziness. Still having a lot of back pain and going down his leg. Planning to get an epidural. Follow his last surgery BP was low and his amlodipine was reduced. This past week BP was running high and dose increased back. Notes he does fall asleep easily. No snoring.   Current Outpatient Medications on File Prior to Visit  Medication Sig Dispense Refill   acetaminophen (TYLENOL) 650 MG CR tablet Take 1,300 mg by mouth every 8 (eight) hours as needed for pain.     amLODipine (NORVASC) 2.5 MG tablet Take 1 tablet (2.5 mg total) by mouth 2 (two) times daily. (Patient taking differently: Take 2.5 mg by mouth 2 (two) times daily. Takes 2 tablets in morning and 1 in evening.) 180 tablet 3   benazepril (LOTENSIN) 20 MG tablet TAKE 1 TABLET BY MOUTH TWICE A DAY 180 tablet 2   docusate sodium (COLACE) 100 MG capsule Take 1 capsule (100 mg total) by mouth 2 (two) times daily. (Patient taking differently: Take 100 mg by mouth 2 (two) times daily as needed for moderate constipation.) 180 capsule 3   famotidine (PEPCID) 20 MG tablet TAKE 1 TABLET(20 MG) BY MOUTH TWICE DAILY (Patient taking differently: Take 20 mg by mouth at bedtime.) 180 tablet 0   ketoconazole  (NIZORAL) 2 % cream Apply 1 application topically daily as needed for irritation.     loratadine (CLARITIN) 10 MG tablet Take 10 mg by mouth daily.     pantoprazole (PROTONIX) 40 MG tablet Take 1 tablet (40 mg total) by mouth 2 (two) times daily. (Patient taking differently: Take 40 mg by mouth every morning.) 180 tablet 2   polyethylene glycol powder (GLYCOLAX/MIRALAX) 17 GM/SCOOP powder TAKE 17 GRAMS BY MOUTH TWICE DAILY (Patient taking differently: Take 17 g by mouth at bedtime.) 510 g 2   potassium chloride (KLOR-CON) 10 MEQ tablet TAKE 1 TABLET BY MOUTH ONCE A DAY 90 tablet 3   sildenafil (REVATIO) 20 MG tablet Take 2 to 5 tablets as needed 50 tablet 3   simvastatin (ZOCOR) 10 MG tablet TAKE 1 TABLET BY MOUTH ONCE A DAY AT [facility] THE EVENING 90 tablet 3   tamsulosin (FLOMAX) 0.4 MG CAPS capsule TAKE 1 CAPSULE BY MOUTH ONCE DAILY AFTERSUPPER 90 capsule 3   traMADol (ULTRAM) 50 MG tablet Take 1-2 tablets (50-100 mg total) by mouth every 6 (six) hours as needed for moderate pain. 40 tablet 0   No current facility-administered medications on file prior to visit.    No Known Allergies  Past Medical History:  Diagnosis Date   Abnormal echocardiogram Jan 2012   grade 1 diastolic dysfunction,  normal EF, mild LVH   Anemia    Basal cell carcinoma 04/08/2019   left nose inferior to nasal alar groove. EDC: 05/20/2019   Bell palsy 10/2010   presented with facial numbness   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    Degenerative disk disease    GERD (gastroesophageal reflux disease)    History of kidney stones    Hyperlipidemia    Hypertension    dr Truitt Cruey [place]   Kidney cysts    Lumbar stenosis    Nephrolithiasis    Normal nuclear stress test 2008   Osteoarthritis    Skin cancer    Vocal cord leukoplakia    Dr Erik Obey    Past Surgical History:  Procedure Laterality Date   COLONOSCOPY  04/2019   TAx2, hemorrhoids, rpt 42yr ([place])   ESOPHAGOGASTRODUODENOSCOPY   04/2019   erosive gastropathy, biopsy negative for barrett's ([place])   KNEE ARTHROSCOPY Right    LUMBAR LAMINECTOMY/DECOMPRESSION MICRODISCECTOMY N/A 11/12/2013   JOphelia Charter MD - complicated by urinary retention   MULTIPLE TOOTH EXTRACTIONS     SHOULDER SURGERY Bilateral    TONSILLECTOMY     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   Procedure: TOTAL HIP ARTHROPLASTY ANTERIOR APPROACH;  Surgeon: AGaynelle Arabian MD;  Location: [facility];  Service: Orthopedics;  Laterality: Right;  1033m   TOTAL KNEE ARTHROPLASTY Left 11/21/2021   Procedure: TOTAL KNEE ARTHROPLASTY;  Surgeon: AlGaynelle ArabianMD;  Location: [facility];  Service: Orthopedics;  Laterality: Left;    Social History   Tobacco Use  Smoking Status Former   Packs/day: 1.50   Years: 10.00   Pack years: 15.00   Types: Cigarettes   Quit date: 05/15/1976   Years since quitting: 45.8  Smokeless Tobacco Current   Types: Chew  Tobacco Comments   chews tobacco    Social History   Substance and Sexual Activity  Alcohol Use Yes   Comment: rare    Family History  Problem Relation Age of Onset   Heart failure Father [id number] Hypertension Father    Arthritis Father    Anemia Mother        needed regular transfusions   Prostate cancer Son    Cancer Neg Hx    Diabetes Neg Hx    Colon cancer Neg Hx    Rectal cancer Neg Hx    Stomach cancer Neg Hx    Inflammatory bowel disease Neg Hx    Liver disease Neg Hx    Pancreatic cancer Neg Hx     Review of Systems: The review of systems is per the HPI.  All other systems were reviewed and are negative.  Physical Exam: BP 120/64 (BP Location: Left Arm, Patient Position: Sitting, Cuff Size: Normal)   Pulse 77   Ht 5' 7.5" (1.715 m)   Wt 176 lb (79.8 kg)   BMI 27.16 kg/m  GENERAL:  Well appearing WM in NAD HEENT:  PERRL, EOMI, sclera are clear. Oropharynx is clear. NECK:  No jugular venous distention, carotid upstroke brisk and symmetric, no bruits, no thyromegaly or  adenopathy LUNGS:  Clear to auscultation bilaterally CHEST:  Unremarkable HEART:  RRR,  PMI not displaced or sustained,S1 and S2 within normal limits, no S3, no S4: no clicks, no rubs, no murmurs ABD:  Soft, nontender. BS +, no masses or bruits. No hepatomegaly, no splenomegaly EXT:  2 + pulses throughout, no edema, no cyanosis no clubbing SKIN:  Warm and dry.  No rashes NEURO:  Alert and oriented x 3. Cranial nerves II through XII intact. PSYCH:  Cognitively intact   Laboratory data: Lab Results  Component Value Date   WBC 13.7 (H) 11/22/2021   HGB 11.0 (L) 11/22/2021   HCT 34.4 (L) 11/22/2021   PLT 193 11/22/2021   GLUCOSE 133 (H) 11/22/2021   CHOL 149 09/13/2021   TRIG 144 09/13/2021   HDL 40 09/13/2021   LDLDIRECT 102.0 05/15/2011   LDLCALC 84 09/13/2021   ALT 19 11/14/2021   AST 15 11/14/2021   NA 137 11/22/2021   K 4.4 11/22/2021   CL 105 11/22/2021   CREATININE 0.78 11/22/2021   BUN 16 11/22/2021   CO2 25 11/22/2021   TSH 1.86 03/14/2021   PSA 1.50 04/01/2019   INR 1.0 11/14/2021   HGBA1C 5.6 11/11/2021   Ecg none today  Assessment / Plan: 1. HTN well controlled.  Continue amlodipine and lotensin.   2. Hyperlipidemia. Labs look good. Continue current therapy    Follow up in 6 months with lab.

## 2022-03-10 DIAGNOSIS — M4316 Spondylolisthesis, lumbar region: Secondary | ICD-10-CM | POA: Diagnosis not present

## 2022-03-10 DIAGNOSIS — M4150 Other secondary scoliosis, site unspecified: Secondary | ICD-10-CM | POA: Diagnosis not present

## 2022-03-13 ENCOUNTER — Encounter: Payer: Self-pay | Admitting: Cardiology

## 2022-03-13 ENCOUNTER — Ambulatory Visit (INDEPENDENT_AMBULATORY_CARE_PROVIDER_SITE_OTHER): Payer: Medicare Other | Admitting: Cardiology

## 2022-03-13 VITALS — BP 120/64 | HR 77 | Ht 67.5 in | Wt 176.0 lb

## 2022-03-13 DIAGNOSIS — I10 Essential (primary) hypertension: Secondary | ICD-10-CM | POA: Diagnosis not present

## 2022-03-13 DIAGNOSIS — E7800 Pure hypercholesterolemia, unspecified: Secondary | ICD-10-CM

## 2022-03-13 NOTE — Addendum Note (Signed)
Addended by: Kathyrn Lass on: 03/13/2022 11:43 AM   Modules accepted: Orders

## 2022-03-14 IMAGING — DX DG CHEST 2V
2 series · 2 of 2 positions shown · non-contrast
Comparison: None.

CLINICAL DATA: Preop evaluation

EXAM:
CHEST - 2 VIEW

[chest pa]
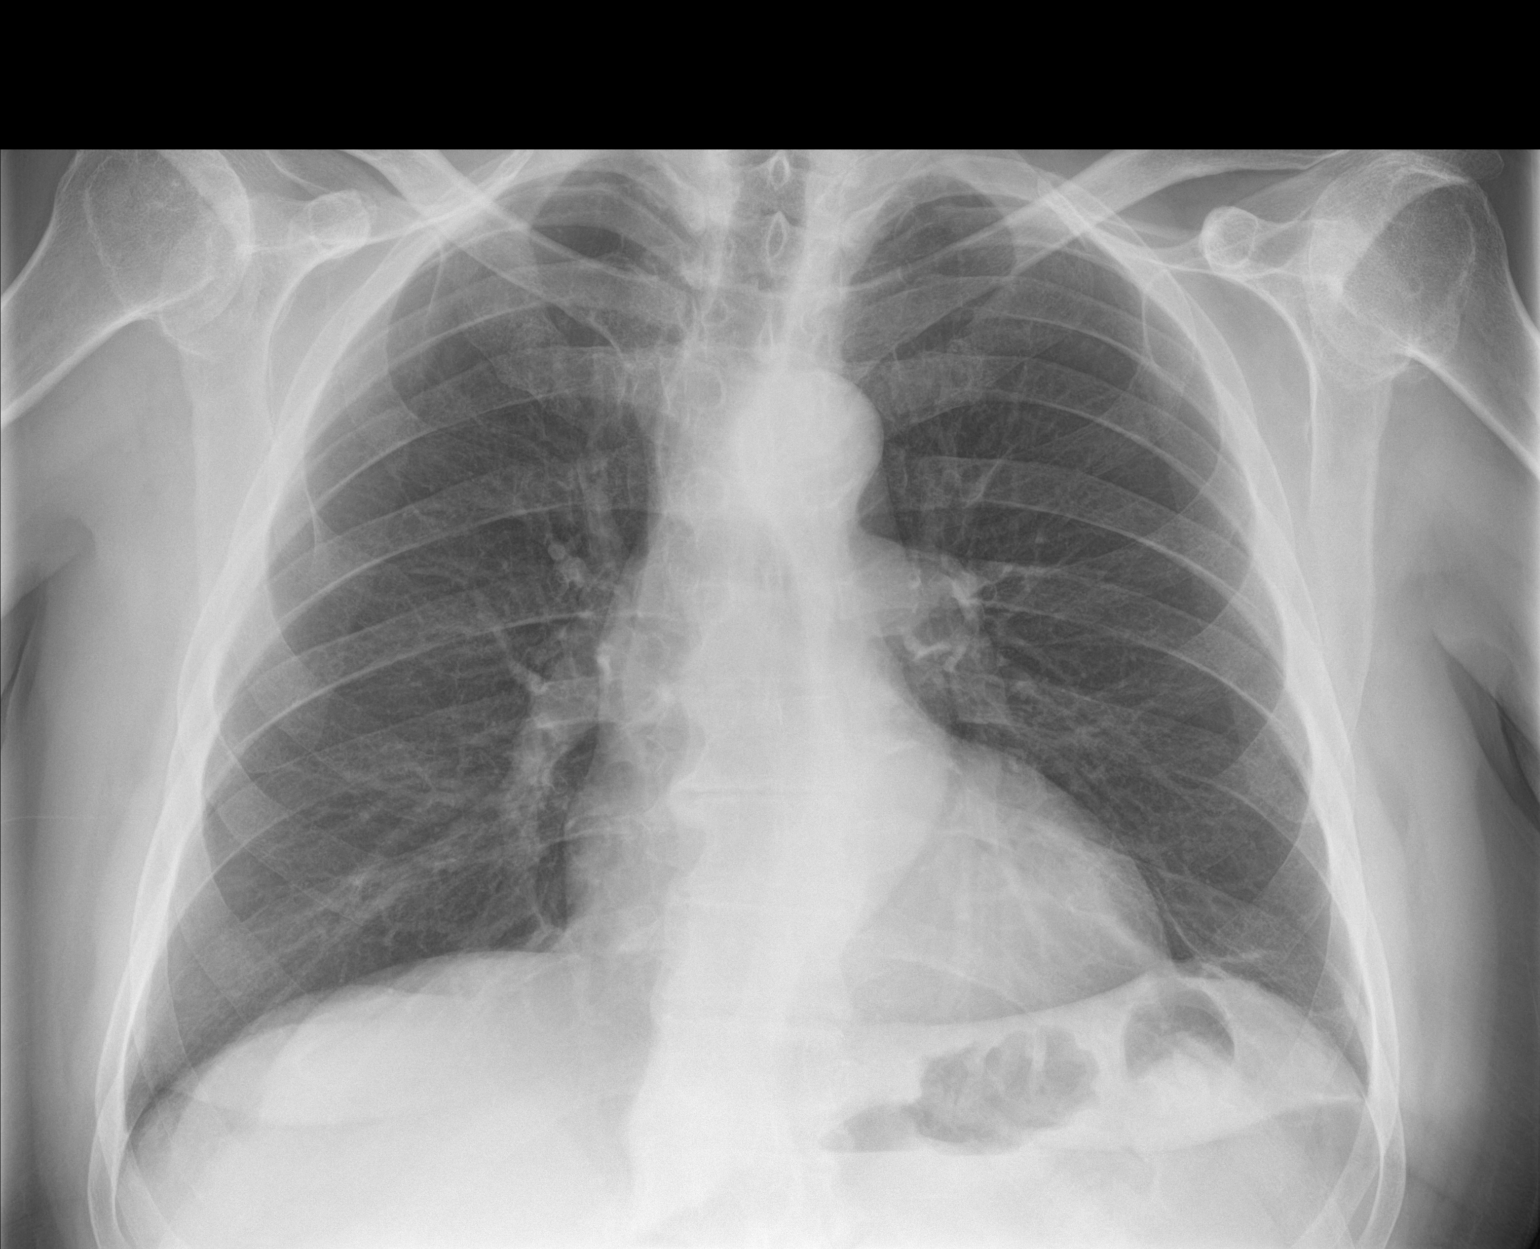

[chest lat]
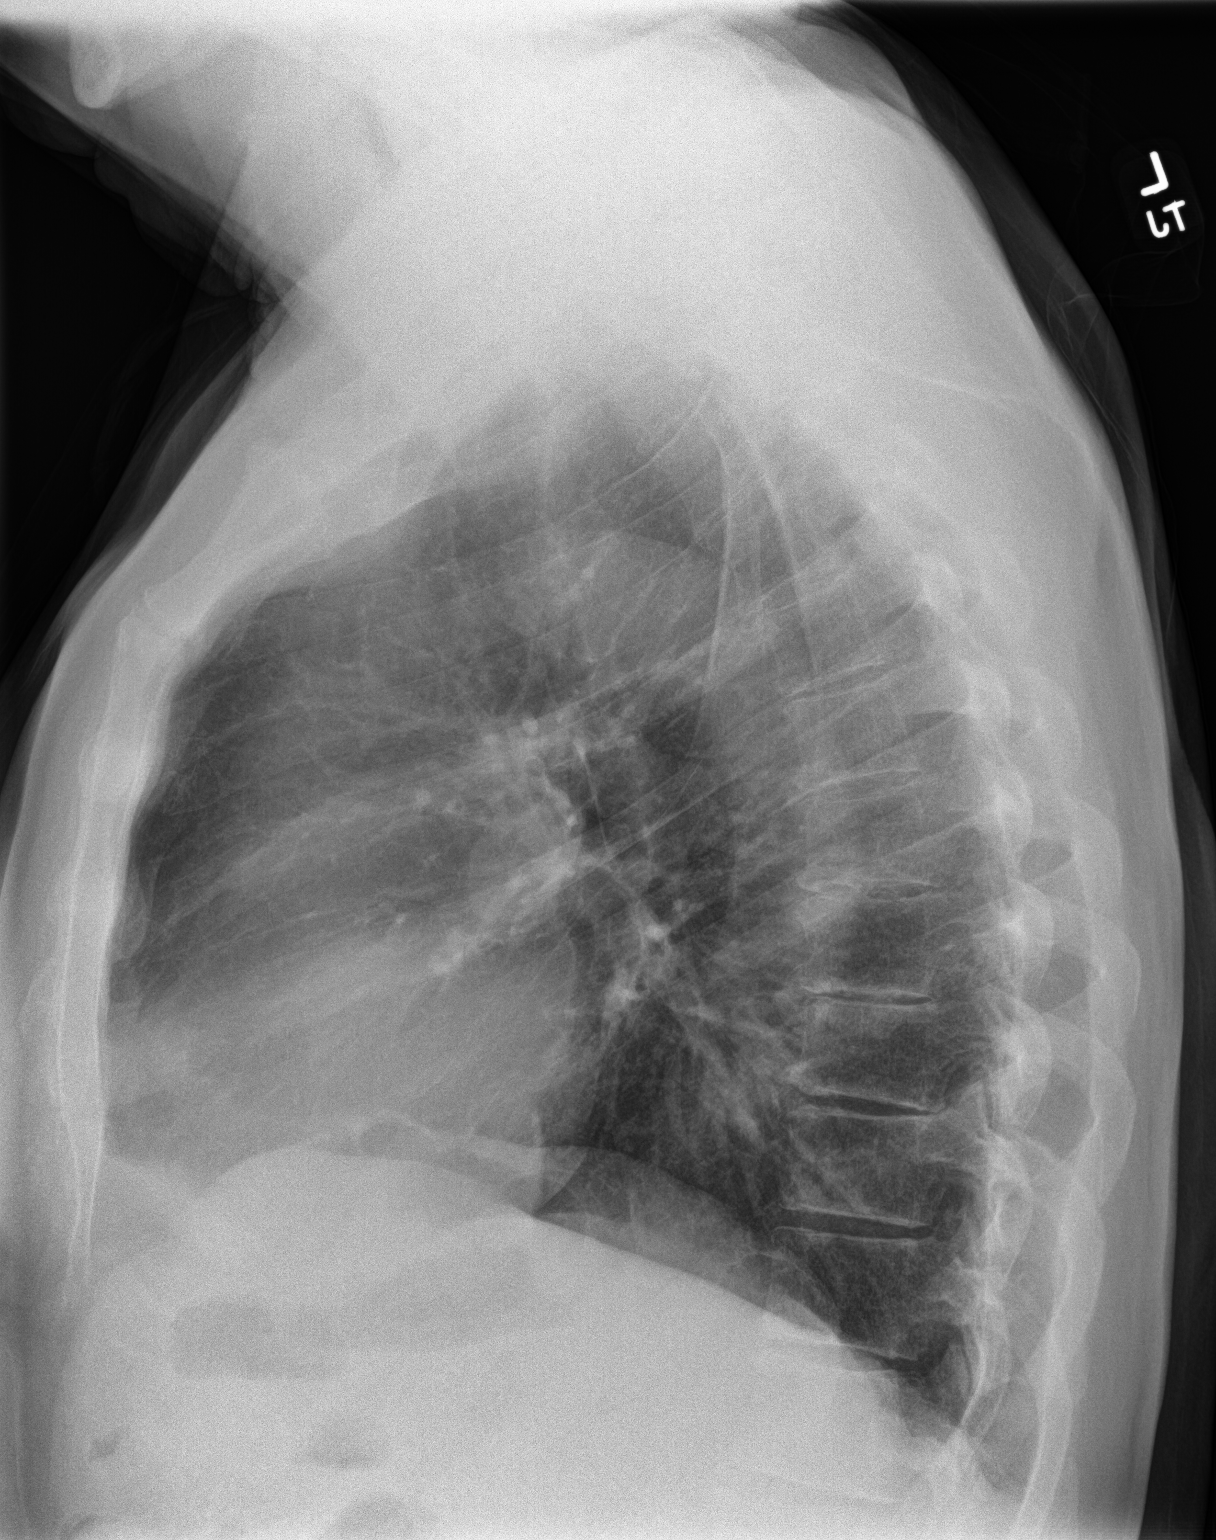

[2 of 2 positions shown; findings below may reference images not displayed]

FINDINGS: The heart size and mediastinal contours are within normal limits.
Both lungs are clear. The visualized skeletal structures are
unremarkable.
IMPRESSION: No active cardiopulmonary disease.

## 2022-03-16 ENCOUNTER — Ambulatory Visit (INDEPENDENT_AMBULATORY_CARE_PROVIDER_SITE_OTHER): Payer: Medicare Other | Admitting: Dermatology

## 2022-03-16 DIAGNOSIS — L570 Actinic keratosis: Secondary | ICD-10-CM

## 2022-03-16 DIAGNOSIS — L821 Other seborrheic keratosis: Secondary | ICD-10-CM

## 2022-03-16 DIAGNOSIS — L578 Other skin changes due to chronic exposure to nonionizing radiation: Secondary | ICD-10-CM | POA: Diagnosis not present

## 2022-03-16 DIAGNOSIS — S20461A Insect bite (nonvenomous) of right back wall of thorax, initial encounter: Secondary | ICD-10-CM | POA: Diagnosis not present

## 2022-03-16 DIAGNOSIS — M5416 Radiculopathy, lumbar region: Secondary | ICD-10-CM | POA: Diagnosis not present

## 2022-03-16 DIAGNOSIS — W57XXXA Bitten or stung by nonvenomous insect and other nonvenomous arthropods, initial encounter: Secondary | ICD-10-CM | POA: Diagnosis not present

## 2022-03-16 DIAGNOSIS — M48062 Spinal stenosis, lumbar region with neurogenic claudication: Secondary | ICD-10-CM | POA: Diagnosis not present

## 2022-03-16 DIAGNOSIS — L820 Inflamed seborrheic keratosis: Secondary | ICD-10-CM | POA: Diagnosis not present

## 2022-03-16 NOTE — Progress Notes (Signed)
   Follow-Up Visit   Subjective  Adrian Davis is a 81 y.o. male who presents for the following: check spots (Face, ears, few months, can get itchy) and hx of tick bite (Back, 28mago). The patient has spots, moles and lesions to be evaluated, some may be new or changing and the patient has concerns that these could be cancer.  The following portions of the chart were reviewed this encounter and updated as appropriate:   Tobacco  Allergies  Meds  Problems  Med Hx  Surg Hx  Fam Hx     Review of Systems:  No other skin or systemic complaints except as noted in HPI or Assessment and Plan.  Objective  Well appearing patient in no apparent distress; mood and affect are within normal limits.  A focused examination was performed including face, ears, back, arms. Relevant physical exam findings are noted in the Assessment and Plan.  face, ears, scalp   x > 20 (20) Pink scaly macules  arms, face, scalp x >20 (20) Stuck on waxy paps with erythema  R upper back Pink macule   Assessment & Plan   Actinic Damage - chronic, secondary to cumulative UV radiation exposure/sun exposure over time - diffuse scaly erythematous macules with underlying dyspigmentation - Recommend daily broad spectrum sunscreen SPF 30+ to sun-exposed areas, reapply every 2 hours as needed.  - Recommend staying in the shade or wearing long sleeves, sun glasses (UVA+UVB protection) and wide brim hats (4-inch brim around the entire circumference of the hat). - Call for new or changing lesions.   Seborrheic Keratoses - Stuck-on, waxy, tan-brown papules and/or plaques  - Benign-appearing - Discussed benign etiology and prognosis. - Observe - Call for any changes  AK (actinic keratosis) (20) face, ears, scalp   x > 20 Destruction of lesion - face, ears, scalp   x > 20 Complexity: simple   Destruction method: cryotherapy   Informed consent: discussed and consent obtained   Timeout:  patient name, date of  birth, surgical site, and procedure verified Lesion destroyed using liquid nitrogen: Yes   Region frozen until ice ball extended beyond lesion: Yes   Outcome: patient tolerated procedure well with no complications   Post-procedure details: wound care instructions given    Inflamed seborrheic keratosis (20) arms, face, scalp x >20 Symptomatic, irritating, patient would like treated.   Destruction of lesion - arms, face, scalp x >20 Complexity: simple   Destruction method: cryotherapy   Informed consent: discussed and consent obtained   Timeout:  patient name, date of birth, surgical site, and procedure verified Lesion destroyed using liquid nitrogen: Yes   Region frozen until ice ball extended beyond lesion: Yes   Outcome: patient tolerated procedure well with no complications   Post-procedure details: wound care instructions given    Bug bite without infection, initial encounter R upper back Hx of tick bite 129mgo Discussed symptoms of Lymes disease, [place] spotted fever, it pt develops symptoms recommend starting Docycyline '100mg'$  1 po bid, pt has prescription that was prescribed by PCP  Return in about 4 months (around 07/17/2022) for AK f/u.  I, SoOthelia PullingRMA, am acting as scribe for DaSarina SerMD . Documentation: I have reviewed the above documentation for accuracy and completeness, and I agree with the above.  DaSarina SerMD

## 2022-03-16 NOTE — Patient Instructions (Addendum)

## 2022-03-20 ENCOUNTER — Encounter: Payer: Self-pay | Admitting: Dermatology

## 2022-04-11 ENCOUNTER — Ambulatory Visit (INDEPENDENT_AMBULATORY_CARE_PROVIDER_SITE_OTHER): Payer: Medicare Other

## 2022-04-11 ENCOUNTER — Encounter: Payer: Self-pay | Admitting: Orthopaedic Surgery

## 2022-04-11 ENCOUNTER — Ambulatory Visit (INDEPENDENT_AMBULATORY_CARE_PROVIDER_SITE_OTHER): Payer: Medicare Other | Admitting: Orthopaedic Surgery

## 2022-04-11 DIAGNOSIS — M25551 Pain in right hip: Secondary | ICD-10-CM

## 2022-04-11 MED ORDER — METHYLPREDNISOLONE ACETATE 40 MG/ML IJ SUSP
80.0000 mg | INTRAMUSCULAR | Status: AC | PRN
  Administered 2022-04-11: 80 mg via INTRA_ARTICULAR

## 2022-04-11 MED ORDER — BUPIVACAINE HCL 0.25 % IJ SOLN
2.0000 mL | INTRAMUSCULAR | Status: AC | PRN
  Administered 2022-04-11: 2 mL via INTRA_ARTICULAR

## 2022-04-11 MED ORDER — LIDOCAINE HCL 1 % IJ SOLN
2.0000 mL | INTRAMUSCULAR | Status: AC | PRN
  Administered 2022-04-11: 2 mL

## 2022-04-11 NOTE — Progress Notes (Signed)
Office Visit Note   Patient: Adrian Davis           Date of Birth: 07-30-1941           MRN: [id number] Visit Date: 04/11/2022              Requested by: [name], MD [place],  [place] [id number] PCP: [name], MD   Assessment & Plan: Visit Diagnoses:  1. Pain in right hip     Plan: Mr. Adrian Davis has a complicated history of back surgery by Dr. Arnoldo Morale, total right knee replacement and right total hip replacement by Dr. Maureen Ralphs and is having discomfort along the lateral aspect of his right hip referred along the lateral aspect of his thigh into his calf particularly when he stands or walks any distance.  I do not have any of his records of Dr. Arnoldo Morale but apparently he has had prior debridement for stenosis without instrumentation and its been suggested according to Mr. Adrian Davis that he have another surgery.  He had a recent epidural steroid injection that "did not help at all."  He has had a prior cortisone injection of the lateral aspect of his right hip that helped for "several days.  He obviously is seeking another opinion regarding his present problem.  Films of his hip demonstrate no complications with the hip replacement.  He does not have a straight leg raise but does have tenderness directly over the greater trochanter.  I think his problem is a combination of trochanteric bursitis and pain referred from his lumbar spine.  I suggested he contact Dr. Arnoldo Morale regarding the issue with his back as I do not have any of those records and I will inject the greater trochanter as I think that will give him some temporary relief.  Happy to see him back at any time.  He has been receiving tramadol from Dr. Arnoldo Morale call his office for further prescriptions  Follow-Up Instructions: Return if symptoms worsen or fail to improve.   Orders:  Orders Placed This Encounter  Procedures   Large Joint Inj: R greater trochanter   XR HIP UNILAT W OR W/O PELVIS 2-3 VIEWS  RIGHT   No orders of the defined types were placed in this encounter.     Procedures: Large Joint Inj: R greater trochanter on 04/11/2022 4:42 PM Indications: pain and diagnostic evaluation Details: 25 G 1.5 in needle  Arthrogram: No  Medications: 2 mL lidocaine 1 %; 80 mg methylPREDNISolone acetate 40 MG/ML; 2 mL bupivacaine 0.25 % Procedure, treatment alternatives, risks and benefits explained, specific risks discussed. Consent was given by the patient. Immediately prior to procedure a time out was called to verify the correct patient, procedure, equipment, support staff and site/side marked as required. Patient was prepped and draped in the usual sterile fashion.       Clinical Data: No additional findings.   Subjective: Chief Complaint  Patient presents with   Right Hip - Follow-up   Patient presents today to discuss his right hip pain. He has had a recent Right Hip Total Arthroplasty preformed on 11/10/2020 by Dr. [name]. Patient reports that in 12/2021 he had a fall on his right hip and landed on a rock. He has been having the most pain noticed while he is walking and while he is trying to sleep.   Review of Systems   Objective: Vital Signs: There were no vitals taken for this visit.  Physical Exam Constitutional:  Appearance: He is well-developed.  Eyes:     Pupils: Pupils are equal, round, and reactive to light.  Pulmonary:     Effort: Pulmonary effort is normal.  Skin:    General: Skin is warm and dry.  Neurological:     Mental Status: He is alert and oriented to person, place, and time.  Psychiatric:        Behavior: Behavior normal.     Ortho Exam awake alert and oriented x3.  Comfortable sitting.  Walks without a limp.  Does not have any percussible tenderness lumbar spine.  No pain with range of motion of his right hip or loss of motion compared to the left hip.  He is tender directly over the greater trochanter of the right hip but skin  intact.  There is no erythema or ecchymosis.  Motor exam appears to be intact.  Specialty Comments:  No specialty comments available.  Imaging: XR HIP UNILAT W OR W/O PELVIS 2-3 VIEWS RIGHT  Result Date: 04/11/2022 Films of the right hip were obtained in the AP and lateral projections.  The right total hip replacement is in excellent position without evidence of any complication.  No evidence of loosening or fracture or ectopic calcification    PMFS History: Patient Active Problem List   Diagnosis Date Noted   Pain in right hip 04/11/2022   Tick bites 03/01/2022   Primary osteoarthritis of left knee 11/21/2021   Skin rash 11/11/2021   OA (osteoarthritis) of knee 10/11/2021   DDD (degenerative disc disease), lumbar 10/04/2021   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   Fatigue 03/14/2021   Primary osteoarthritis of right hip 11/10/2020   Pre-op evaluation 11/04/2020   Smokeless tobacco use 11/04/2020   History of urinary retention 11/04/2020   Chronic constipation 01/21/2020   Nephrolithiasis 11/14/2018   Acquired complex renal cyst 11/14/2018   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   Lower abdominal pain 07/24/2018   Unintentional weight loss 07/24/2018   Vitamin B12 deficiency 01/21/2018   Acute sinusitis 04/13/2017   Actinic keratosis 01/16/2017   Medicare annual wellness visit, initial 01/03/2017   Counseling about travel 01/03/2017   Advanced care planning/counseling discussion 01/03/2017   Changing skin lesion 12/25/2016   Bilateral impacted cerumen 07/25/2016   Vocal cord leukoplakia    Overweight (BMI 25.0-29.9) 02/22/2016   Paresthesias 02/22/2016   GERD (gastroesophageal reflux disease)    OA (osteoarthritis) of hip    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 EDC: 05/20/2019   Bell palsy 10/2010   presented with facial numbness   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    Degenerative disk disease    GERD (gastroesophageal reflux disease)    History of kidney stones    Hyperlipidemia    Hypertension    dr Sanaiyah Kirchhoff [place]   Kidney cysts    Lumbar stenosis    Nephrolithiasis    Normal nuclear stress test 2008   Osteoarthritis    Skin cancer    Vocal cord leukoplakia    Dr Erik Obey    Family History  Problem Relation Age of  Onset   Heart failure Father 65   Hypertension Father    Arthritis Father    Anemia Mother        needed regular transfusions   Prostate cancer Son    Cancer Neg Hx    Diabetes Neg Hx    Colon cancer Neg Hx    Rectal cancer Neg Hx    Stomach cancer Neg Hx    Inflammatory bowel disease Neg Hx    Liver disease Neg Hx    Pancreatic cancer Neg Hx     Past Surgical History:  Procedure Laterality Date   COLONOSCOPY  04/2019   TAx2, hemorrhoids, rpt 58yr ([place])   ESOPHAGOGASTRODUODENOSCOPY  04/2019   erosive gastropathy, biopsy negative for barrett's ([place])   KNEE ARTHROSCOPY Right    LUMBAR LAMINECTOMY/DECOMPRESSION MICRODISCECTOMY N/A 11/12/2013   JOphelia Charter MD - complicated by urinary retention   MULTIPLE TOOTH EXTRACTIONS     SHOULDER SURGERY Bilateral    TONSILLECTOMY     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   Procedure: TOTAL HIP ARTHROPLASTY ANTERIOR APPROACH;  Surgeon: AGaynelle Arabian MD;  Location: [facility];  Service: Orthopedics;  Laterality: Right;  1021m   TOTAL KNEE ARTHROPLASTY Left 11/21/2021   Procedure: TOTAL KNEE ARTHROPLASTY;  Surgeon: AlGaynelle ArabianMD;  Location: [facility];  Service: Orthopedics;  Laterality: Left;   Social History   Occupational History   Occupation: farmer   Tobacco Use   Smoking status: Former    Packs/day: 1.50    Years: 10.00    Total pack years: 15.00    Types: Cigarettes    Quit date: 05/15/1976    Years since quitting: 45.9   Smokeless tobacco: Current    Types: Chew   Tobacco comments:    chews tobacco  Vaping Use   Vaping Use: Never used  Substance and Sexual Activity   Alcohol use: Yes    Comment: rare   Drug use: No   Sexual activity: Not on file

## 2022-04-15 ENCOUNTER — Other Ambulatory Visit: Payer: Self-pay | Admitting: Family Medicine

## 2022-04-24 ENCOUNTER — Telehealth: Payer: Self-pay

## 2022-04-24 NOTE — Progress Notes (Signed)
Chronic Care Management Pharmacy Assistant   Name: Adrian Davis  MRN: [id number] DOB: 1941/03/06  Reason for Encounter: CCM (Hypertension Disease State)  Recent office visits:  03/01/22 Adrian Bush, MD Tick Bite Start: Doxycycline 100 mg. Stop (completed) Methocarbamol 500 mg. Stop (completed) Oxycodone 5-10 mg.  11/11/21 Adrian Bush, MD Pre-op Exam: Chest xray ordered.   Recent consult visits:  04/11/22 [name], MD (Orthopedic Surgery) Pain in right hip Ordered: XR Hip Procedure: Large join injection; r greater trochanter 03/16/22 Adrian Ser, MD (Derm) Actinic Keratosis Procedure: Destruction of lesion 03/13/22 [name], MD (Cardiology) HLD Stop: Doxycycline 100 mg. Stop: Hydrochlorothiazide 25 mg. FU 6 months 03/10/22 Adrian Davis Neurosurgery: Spondylolisthesis, lumbar region; No other information 02/23/22 Adrian Davis Neurosurgery: Spondylolisthesis, lumbar region; No other information 11/03/21 Adrian Foy, PA (Ortho): Osteoarthritis of left knee joint. No other info.  10/27/21 [name], PA (ENT): Bilateral Impacted Cerumen Procedure: Cerumen Removal 10/26/2021 - Adrian Davis, DPM - Podiatry - Patient presented for toe pain. Procedure: Debridement of lesion; placed padding.  10/11/2021 - Adrian Davis - Physical Therapist - Patient presented for stiffness of left knee, pain of left knee joint and osteoarthritis of left knee joint.  10/04/2021 - Adrian Gilmore, NP - [facility] - Patient presented for body mass index 26.0-26.9, Lumbar facet arthropathy, hypertension, DDD - Lumbar and Radiculopathy.  09/21/2021 - [name], MD - Cardiology - Patient presented for hypertension and pre-op exam. Labs: EKG. Start: amLODipine (NORVASC) 2.5 MG tablet - Take 1 tablet (2.5 mg total) by mouth 2 (two) times daily. Stop due to patient preference: MYRBETRIQ 25 MG TB24 tablet.   After Care Visits: Adrian Davis (Orthopaedic Surgery) No  information.  02/14, 03/08, 03/21   Physical Therapy Visits: 11/24/21 -01/06/22 13 visits   Hospital visits:  11/21/21 [facility] Left Total Knee Arthroplasty Start: Methocarbamol 500 mg Start: Oxycodone HCI 5-10 mg Start: Rivaroxaban 10 mg Start: Tramadol HCI 50-100 mg Stop: Asprin EC 81 mg Stop: B-12 1000 mcg Stop: Diclofenac Sodium 1% Gel Stop: Naproxen Sodium 220 mg tablet  Medications: Outpatient Encounter Medications as of 04/24/2022  Medication Sig Note   acetaminophen (TYLENOL) 650 MG CR tablet Take 1,300 mg by mouth every 8 (eight) hours as needed for pain.    amLODipine (NORVASC) 2.5 MG tablet Take 1 tablet (2.5 mg total) by mouth 2 (two) times daily. (Patient taking differently: Take 2.5 mg by mouth 2 (two) times daily. Takes 2 tablets in morning and 1 in evening.)    aspirin 81 MG chewable tablet Chew by mouth daily.    benazepril (LOTENSIN) 20 MG tablet TAKE 1 TABLET BY MOUTH TWICE A DAY    docusate sodium (COLACE) 100 MG capsule Take 1 capsule (100 mg total) by mouth 2 (two) times daily. (Patient taking differently: Take 100 mg by mouth 2 (two) times daily as needed for moderate constipation.) 11/04/2021: Pt does take 1 every night at bedtime    famotidine (PEPCID) 20 MG tablet TAKE 1 TABLET BY MOUTH TWICE A DAY    ketoconazole (NIZORAL) 2 % cream Apply 1 application topically daily as needed for irritation. 11/04/2021: Wife's Rx   loratadine (CLARITIN) 10 MG tablet Take 10 mg by mouth daily.    pantoprazole (PROTONIX) 40 MG tablet Take 1 tablet (40 mg total) by mouth 2 (two) times daily. (Patient taking differently: Take 40 mg by mouth every morning.)    polyethylene glycol powder (GLYCOLAX/MIRALAX) 17 GM/SCOOP powder TAKE 17 GRAMS BY MOUTH TWICE DAILY (Patient  taking differently: Take 17 g by mouth at bedtime.)    potassium chloride (KLOR-CON) 10 MEQ tablet TAKE 1 TABLET BY MOUTH ONCE A DAY    sildenafil (REVATIO) 20 MG tablet Take 2 to 5 tablets as needed 10/29/2020: ON  HOLD    simvastatin (ZOCOR) 10 MG tablet TAKE 1 TABLET BY MOUTH ONCE A DAY AT SIXIN THE EVENING    tamsulosin (FLOMAX) 0.4 MG CAPS capsule TAKE 1 CAPSULE BY MOUTH ONCE DAILY AFTERSUPPER    traMADol (ULTRAM) 50 MG tablet Take 1-2 tablets (50-100 mg total) by mouth every 6 (six) hours as needed for moderate pain.    No facility-administered encounter medications on file as of 04/24/2022.    Recent Office Vitals: BP Readings from Last 3 Encounters:  03/13/22 120/64  03/01/22 140/78  11/22/21 138/67   Pulse Readings from Last 3 Encounters:  03/13/22 77  03/01/22 74  11/22/21 79    Wt Readings from Last 3 Encounters:  03/13/22 176 lb (79.8 kg)  03/01/22 177 lb 8 oz (80.5 kg)  11/21/21 173 lb 9.6 oz (78.7 kg)     Kidney Function Lab Results  Component Value Date/Time   CREATININE 0.78 11/22/2021 03:17 AM   CREATININE 0.88 11/14/2021 09:45 AM   CREATININE 0.85 01/27/2016 11:36 AM   CREATININE 0.88 06/02/2015 10:26 AM   GFR 75.55 03/14/2021 02:36 PM   GFRNONAA >60 11/22/2021 03:17 AM   GFRAA 80 05/05/2020 07:31 AM       Latest Ref Rng & Units 11/22/2021    3:17 AM 11/14/2021    9:45 AM 09/13/2021    7:53 AM  BMP  Glucose 70 - 99 mg/dL 133  95  88   BUN 8 - 23 mg/dL 16  33  19   Creatinine 0.61 - 1.24 mg/dL 0.78  0.88  1.00   BUN/Creat Ratio 10 - 24   19   Sodium 135 - 145 mmol/L 137  140  144   Potassium 3.5 - 5.1 mmol/L 4.4  4.2  4.0   Chloride 98 - 111 mmol/L 105  106  103   CO2 22 - 32 mmol/L '25  28  26   '$ Calcium 8.9 - 10.3 mg/dL 8.8  9.4  9.5     Contacted patient on 04/27/2022 to discuss hypertension disease state  Current antihypertensive regimen:  Amlodipine 2.5 mg BID Benazepril 20 mg BID HCTZ 25 mg daily  Patient verbally confirms he is taking the above medications as directed. Yes  How often are you checking your Blood Pressure? 1-2x per week  he checks his blood pressure in the evening after taking his medication.  Current home BP readings: Patient did  not have any readings as he was at work. I have asked patient to take their blood pressure daily and keep a log. Advised patient I would call back on 05/05/22 for log. Patient verbalized understanding and agreed.   Wrist or arm cuff: Arm cuff Caffeine intake: Patient drinks 2 cups of coffee a day Salt intake: Patient watches his salt intake Over the counter medications including pseudoephedrine or NSAIDs?  Tylenol 1 time a day for hip pain  Any readings above 180/120? No  What recent interventions/DTPs have been made by any provider to improve Blood Pressure control since last CPP Visit: No recent interventions  Any recent hospitalizations or ED visits since last visit with CPP? No  What diet changes have been made to improve Blood Pressure Control?  Patient watches his salt intake.  What exercise is being done to improve your Blood Pressure Control?  Patient walks and rides a stationary bike   Adherence Review: Is the patient currently on ACE/ARB medication? Yes Does the patient have >5 day gap between last estimated fill dates? No  Star Rating Drugs:  Medication:  Last Fill: Day Supply Benazepril 20 mg 01/31/22 90 Simvastatin 10 mg 02/20/22 90   Care Gaps: Annual wellness visit in last year? No Scheduled for 04/28/22 Most Recent BP reading: 120/64 on 03/13/2022  Upcoming appointments: PCP appointment on 04/28/22 for AWV CCM appointment on 08/02/22  [name], CPP notified  Marijean Niemann, [place] Assistant [phone]

## 2022-04-28 ENCOUNTER — Telehealth: Payer: Self-pay

## 2022-04-28 NOTE — Telephone Encounter (Signed)
This nurse attempted to call patient for scheduled telephonic AWV. Called noted number twice and left a message. Called home number and wife answered phone. Stated that he was at work. We will call again to reschedule for another time.

## 2022-05-05 NOTE — Progress Notes (Signed)
Called patient for blood pressure log as previously discussed.   Date Blood Pressure Pulse 07/14  07/13 126/74   97 07/12 140/78   79 07/11  07/10 134/70   82 07/09  07/08  07/07 129/75   94  Spoke with Trudy (patient's wife for the call). She stated patient is taking the following medication for his blood pressure:   Not taking HCTZ for a year according to Trudy due to BP being low enough - Dr. Martinique knows he is no longer taking.   Amlodipine 2.5 mg - 2 morning 1 night  Benazepril 20 mg - 1 morning 1 night  Patient's wife also stated she is concerned with patient's pulse. I have asked patient to take his blood pressure daily so we can monitor his pulse more closely. I advised not to take blood pressure or pulse within 30 minutes of eating, drinking caffeine or chewing tobacco (which patient does). I advised patient's wife that I would call back on 05/12/2022 for patient's log. She agreed and expressed understanding.    [name], CPP notified  Marijean Niemann, [place] Clinical Pharmacy Assistant [phone]

## 2022-05-11 DIAGNOSIS — M5416 Radiculopathy, lumbar region: Secondary | ICD-10-CM | POA: Diagnosis not present

## 2022-05-11 DIAGNOSIS — Z6827 Body mass index (BMI) 27.0-27.9, adult: Secondary | ICD-10-CM | POA: Diagnosis not present

## 2022-05-11 DIAGNOSIS — M48062 Spinal stenosis, lumbar region with neurogenic claudication: Secondary | ICD-10-CM | POA: Diagnosis not present

## 2022-05-24 ENCOUNTER — Other Ambulatory Visit: Payer: Self-pay | Admitting: Cardiology

## 2022-06-09 DIAGNOSIS — M5416 Radiculopathy, lumbar region: Secondary | ICD-10-CM | POA: Diagnosis not present

## 2022-06-13 ENCOUNTER — Other Ambulatory Visit: Payer: Self-pay | Admitting: Cardiology

## 2022-06-14 ENCOUNTER — Telehealth: Payer: Self-pay | Admitting: Cardiology

## 2022-06-14 NOTE — Telephone Encounter (Signed)
Error

## 2022-07-08 ENCOUNTER — Other Ambulatory Visit: Payer: Self-pay | Admitting: Cardiology

## 2022-07-25 ENCOUNTER — Other Ambulatory Visit: Payer: Self-pay | Admitting: Cardiology

## 2022-07-28 ENCOUNTER — Telehealth: Payer: Self-pay

## 2022-07-28 NOTE — Chronic Care Management (AMB) (Signed)
    Chronic Care Management Pharmacy Assistant   Name: Adrian Davis  MRN: [id number] DOB: 04/13/1941  Reason for Encounter: Reminder Call     Medications: Outpatient Encounter Medications as of 07/28/2022  Medication Sig Note   acetaminophen (TYLENOL) 650 MG CR tablet Take 1,300 mg by mouth every 8 (eight) hours as needed for pain.    amLODipine (NORVASC) 2.5 MG tablet TAKE 2 TABLETS BY MOUTH EVERY MORNING AND 1 TABLET IN THE AFTERNOON AS DIRECTED    aspirin 81 MG chewable tablet Chew by mouth daily.    benazepril (LOTENSIN) 20 MG tablet TAKE 1 TABLET BY MOUTH TWICE A DAY    docusate sodium (COLACE) 100 MG capsule Take 1 capsule (100 mg total) by mouth 2 (two) times daily. (Patient taking differently: Take 100 mg by mouth 2 (two) times daily as needed for moderate constipation.) 11/04/2021: Pt does take 1 every night at bedtime    famotidine (PEPCID) 20 MG tablet TAKE 1 TABLET BY MOUTH TWICE A DAY    ketoconazole (NIZORAL) 2 % cream Apply 1 application topically daily as needed for irritation. 11/04/2021: Wife's Rx   loratadine (CLARITIN) 10 MG tablet Take 10 mg by mouth daily.    pantoprazole (PROTONIX) 40 MG tablet TAKE 1 TABLET BY MOUTH TWICE A DAY    polyethylene glycol powder (GLYCOLAX/MIRALAX) 17 GM/SCOOP powder TAKE 17 GRAMS BY MOUTH TWICE DAILY (Patient taking differently: Take 17 g by mouth at bedtime.)    potassium chloride (KLOR-CON) 10 MEQ tablet TAKE 1 TABLET BY MOUTH ONCE A DAY    sildenafil (REVATIO) 20 MG tablet Take 2 to 5 tablets as needed 10/29/2020: ON HOLD    simvastatin (ZOCOR) 10 MG tablet TAKE 1 TABLET BY MOUTH ONCE A DAY AT SIXIN THE EVENING    tamsulosin (FLOMAX) 0.4 MG CAPS capsule TAKE 1 CAPSULE BY MOUTH ONCE DAILY AFTERSUPPER    traMADol (ULTRAM) 50 MG tablet Take 1-2 tablets (50-100 mg total) by mouth every 6 (six) hours as needed for moderate pain.    No facility-administered encounter medications on file as of 07/28/2022.    Destry Dauber Moilanen was contacted to  remind of upcoming telephone visit with [name] on 08/02/22 at 1:30. Patient was reminded to have any blood glucose and blood pressure readings available for review at appointment.   Patient confirmed appointment.  Are you having any problems with your medications? No   Do you have any concerns you like to discuss with the pharmacist? No  CCM referral has been placed prior to visit?  No   Star Rating Drugs: Medication:  Last Fill: Day Supply Benazepril '20mg'$  07/25/22 30 Simvastatin '10mg'$  05/24/22  [place], CPP notified  [name], [place]  [phone]

## 2022-08-02 ENCOUNTER — Telehealth: Payer: Medicare Other

## 2022-08-02 ENCOUNTER — Ambulatory Visit: Payer: Medicare Other | Admitting: Pharmacist

## 2022-08-02 DIAGNOSIS — E785 Hyperlipidemia, unspecified: Secondary | ICD-10-CM

## 2022-08-02 DIAGNOSIS — I10 Essential (primary) hypertension: Secondary | ICD-10-CM

## 2022-08-02 DIAGNOSIS — N401 Enlarged prostate with lower urinary tract symptoms: Secondary | ICD-10-CM

## 2022-08-02 NOTE — Patient Instructions (Addendum)
Visit Information  Phone number for Pharmacist: [phone]   Goals Addressed   None     Care Plan : [place]  Updates made by [name], RPH since 08/02/2022 12:00 AM     Problem: Hypertension, Hyperlipidemia, GERD, Osteoarthritis, Overactive Bladder, and BPH   Priority: High     Long-Range Goal: Disease management   Start Date: 08/05/2021  Expected End Date: 08/02/2022  This Visit's Progress: On track  Recent Progress: On track  Priority: High  Note:   Current Barriers:  None identified  Pharmacist Clinical Goal(s):  Patient will contact provider office for questions/concerns as evidenced notation of same in electronic health record through collaboration with PharmD and provider.   Interventions: 1:1 collaboration with [name], MD regarding development and update of comprehensive plan of care as evidenced by provider attestation and co-signature Inter-disciplinary care team collaboration (see longitudinal plan of care) Comprehensive medication review performed; medication list updated in electronic medical record  Hypertension (BP goal <140/90) -Controlled - Pt reports BP at home generally < 140/90; -Current treatment: Amlodipine 2.5 mg BID - Appropriate, Effective, Safe, Accessible Benazepril 20 mg BID -Appropriate, Effective, Safe, Accessible -Medications previously tried: HCTZ -Educated on BP goals and benefits of medications for prevention of heart attack, stroke and kidney damage; Importance of home blood pressure monitoring; -Counseled that amlodipine can cause swelling; if swelling becomes more bothersome he can notify PCP or cardiologist to adjust medications -Counseled to monitor BP at home daily -Recommend to continue current medication  Hyperlipidemia: (LDL goal < 100) -Controlled - LDL 84 (08/2021) at goal; pt endorses compliance with statin and denies issues -Current treatment: Simvastatin 10 mg daily -Appropriate,  Effective, Safe, Accessible -Educated on Cholesterol goals; Benefits of statin for ASCVD risk reduction; -Recommended to continue current medication  GERD (Goal: manage symptoms) -Controlled - Patient is satisfied with current regimen and denies issues -Current treatment  Pantoprazole 40 mg BID -Appropriate, Effective, Safe, Accessible Famotidine 20 mg BID -Appropriate, Effective, Safe, Accessible -Recommended to continue current medication  Chronic constipation (Goal: manage symptoms) -Not ideally controlled -pt reports constipation symptoms regularly -Current treatment  Docusate 100 mg BID -Appropriate, Effective, Safe, Accessible Miralax BID -Appropriate, Effective, Safe, Accessible -Counseled on importance of hydration, dietary fiber -Counseled that daily Benadryl may be contributing due to anticholinergic effects -Advised to stop daily Benadryl, use Claritin instead  BPH / OAB (Goal: manage symptoms) -Not ideally controlled - pt reports urinary issues are exacerbated by HCTZ; he is not sure if Myrbetriq is helping -Follows with urology (Dr Jeffie Pollock) -Current treatment  Tamsulosin 0.4 mg daily -Appropriate, Effective, Safe, Accessible -Medications previously tried: trospium (SE), Myrbetriq -Counseled on benefits of tamsulosin for long term control of BPH -Recommend trial off Myrbetriq to see if there is a change in urinary symptoms (note BP may improve off of Myrbetriq)  Osteoarthritis (Goal: manage pain) -Not ideally controlled - pt reports issues with knees, he is planning for a knee replacement next year; he reports alternating Aleve and Tylenol for pain control -follows with ortho ([name]); receiving injections -Current treatment  Tylenol 650 mg - 2 daily PM -Appropriate, Effective, Safe, Accessible Aleve OTC - 2 daily AM -Appropriate, Effective, Query Safe -Medications previously tried: meloxicam  -Counseled on NSAID risks - GI bleeding, hypertension, kidney damage; pt  endorses aleve is the only thing that helps, Tylenol does not help much; he reports his cardiologist is aware he is taking Aleve and is ok with it; he is taking a daily  PPI which offers some protection against GI bleeding -Advised to use lowest effective dose of Aleve  Health Maintenance -Vaccine gaps: Shingrix, Flu, TDAP  Patient Goals/Self-Care Activities Patient will:  - take medications as prescribed focus on medication adherence by pill box check blood pressure daily -Use lowest effective dose of Aleve      The patient verbalized understanding of instructions, educational materials, and care plan provided today and DECLINED offer to receive copy of patient instructions, educational materials, and care plan.  The patient has been provided with contact information for the care management team and has been advised to call with any health related questions or concerns.   [name], PharmD, BCACP Clinical Pharmacist [facility] [phone]

## 2022-08-02 NOTE — Progress Notes (Signed)
Chronic Care Management Pharmacy Note  08/02/2022 Name:  Adrian Davis MRN:  [id number] DOB:  04/03/1941  Summary: CCM F/U visit -Reviewed medications; pt affirms compliance and denies issues -HTN: BP at goal < 140/90 at home, denies hypotensive s/sx  Recommendations/Changes made from today's visit: -No med changes  Plan: -The patient has been provided with contact information for the care management team and has been advised to call with any health related questions or concerns.      Subjective: Adrian Davis is an 81 y.o. year old male who is a primary patient of [name], MD.  The CCM team was consulted for assistance with disease management and care coordination needs.    Engaged with patient by telephone for follow up visit in response to provider referral for pharmacy case management and/or care coordination services.   Consent to Services:  The patient was given information about Chronic Care Management services, agreed to services, and gave verbal consent prior to initiation of services.  Please see initial visit note for detailed documentation.   Patient Care Team: [name], MD as PCP - General (Family Medicine) Martinique, Peter M, MD as PCP - Cardiology (Cardiology) [name], [facility] as Pharmacist (Pharmacist)  Recent office visits: 03/01/22 Dr Danise Mina OV: tick bite - rx'd doxycycline  11/11/21 Dr Danise Mina OV: pre-op eval - L TKA.   Recent consult visits: 03/13/22 Dr Martinique (Cardiology): f/u- d/c HCTZ 09/21/21 Dr Martinique (Cardiology): f/u - refilled amlodipine. D/C myrbetriq (pt preference)  04/11/21-Cardiology-Patient presented for follow up hypertension and HLD.No medication changes follow up 6 months  02/01/21-Orthopedic surgery- no data found  Hospital visits: None in previous 6 months   Objective:  Lab Results  Component Value Date   CREATININE 0.78 11/22/2021   BUN 16 11/22/2021   GFR 75.55 03/14/2021   GFRNONAA >60  11/22/2021   GFRAA 80 05/05/2020   NA 137 11/22/2021   K 4.4 11/22/2021   CALCIUM 8.8 (L) 11/22/2021   CO2 25 11/22/2021   GLUCOSE 133 (H) 11/22/2021    Lab Results  Component Value Date/Time   HGBA1C 5.6 11/11/2021 11:50 AM   HGBA1C 5.8 11/02/2020 09:57 AM   HGBA1C (H) 11/12/2010 08:28 PM    5.7 (NOTE)                                                                       According to the ADA Clinical Practice Recommendations for 2011, when HbA1c is used as a screening test:   >=6.5%   Diagnostic of Diabetes Mellitus           (if abnormal result  is confirmed)  5.7-6.4%   Increased risk of developing Diabetes Mellitus  References:Diagnosis and Classification of Diabetes Mellitus,Diabetes KGYJ,8563,14(HFWYO 1):S62-S69 and Standards of Medical Care in         Diabetes - 2011,Diabetes Care,2011,34  (Suppl 1):S11-S61.   GFR 75.55 03/14/2021 02:36 PM   GFR 86.83 11/02/2020 09:57 AM    Last diabetic Eye exam: No results found for: "HMDIABEYEEXA"  Last diabetic Foot exam: No results found for: "HMDIABFOOTEX"   Lab Results  Component Value Date   CHOL 149 09/13/2021   HDL 40 09/13/2021   LDLCALC 84 09/13/2021   LDLDIRECT  102.0 05/15/2011   TRIG 144 09/13/2021   CHOLHDL 3.7 09/13/2021       Latest Ref Rng & Units 11/14/2021    9:45 AM 09/13/2021    7:46 AM 04/07/2021    7:20 AM  Hepatic Function  Total Protein 6.5 - 8.1 g/dL 7.5  6.6  6.4   Albumin 3.5 - 5.0 g/dL 4.5  4.5  4.1   AST 15 - 41 U/L _0 ALT 0 - 44 U/L _1 Alk Phosphatase 38 - 126 U/L 50  58  71   Total Bilirubin 0.3 - 1.2 mg/dL 0.7  0.3  0.3   Bilirubin, Direct 0.00 - 0.40 mg/dL  <0.10  <0.10     Lab Results  Component Value Date/Time   TSH 1.86 03/14/2021 02:36 PM   TSH 2.42 01/21/2020 11:28 AM   FREET4 0.81 04/01/2019 10:24 AM       Latest Ref Rng & Units 11/22/2021    3:17 AM 11/14/2021    9:45 AM 03/14/2021    2:36 PM  CBC  WBC 4.0 - 10.5 K/uL 13.7  7.7  7.6   Hemoglobin 13.0 - 17.0  g/dL 11.0  15.1  13.8   Hematocrit 39.0 - 52.0 % 34.4  46.8  41.3   Platelets 150 - 400 K/uL 193  240  234.0     No results found for: "VD25OH"  Clinical ASCVD: No  The ASCVD Risk score (Arnett DK, et al., 2019) failed to calculate for the following reasons:   The 2019 ASCVD risk score is only valid for ages 80 to 80       01/03/2017   10:36 AM  Depression screen PHQ 2/9  Decreased Interest 0  Down, Depressed, Hopeless 0  PHQ - 2 Score 0     Social History   Tobacco Use  Smoking Status Former   Packs/day: 1.50   Years: 10.00   Total pack years: 15.00   Types: Cigarettes   Quit date: 05/15/1976   Years since quitting: 46.2  Smokeless Tobacco Current   Types: Chew  Tobacco Comments   chews tobacco   BP Readings from Last 3 Encounters:  03/13/22 120/64  03/01/22 140/78  11/22/21 138/67   Pulse Readings from Last 3 Encounters:  03/13/22 77  03/01/22 74  11/22/21 79   Wt Readings from Last 3 Encounters:  03/13/22 176 lb (79.8 kg)  03/01/22 177 lb 8 oz (80.5 kg)  11/21/21 173 lb 9.6 oz (78.7 kg)   BMI Readings from Last 3 Encounters:  03/13/22 27.16 kg/m  03/01/22 27.39 kg/m  11/21/21 26.79 kg/m    Assessment/Interventions: Review of patient past medical history, allergies, medications, health status, including review of consultants reports, laboratory and other test data, was performed as part of comprehensive evaluation and provision of chronic care management services.   SDOH:  (Social Determinants of Health) assessments and interventions performed: No  SDOH Screenings   Tobacco Use: High Risk (04/11/2022)    CCM Care Plan  No Known Allergies  Medications Reviewed Today     Reviewed by Garald Balding, MD (Physician) on 04/11/22 at 1641  Med List Status: <None>   Medication Order Taking? Sig Documenting Provider Last Dose Status Informant  acetaminophen (TYLENOL) 650 MG CR tablet [id number] Yes Take 1,300 mg by mouth every 8 (eight) hours as  needed for pain. [provider] Taking Active Spouse/Significant Other  amLODipine (NORVASC) 2.5 MG  tablet [id number] Yes Take 1 tablet (2.5 mg total) by mouth 2 (two) times daily.  Patient taking differently: Take 2.5 mg by mouth 2 (two) times daily. Takes 2 tablets in morning and 1 in evening.   Martinique, Peter M, MD Taking Active Spouse/Significant Other  aspirin 81 MG chewable tablet [id number] Yes Chew by mouth daily. [provider] Taking Active   benazepril (LOTENSIN) 20 MG tablet [id number] Yes TAKE 1 TABLET BY MOUTH TWICE A DAY Martinique, Peter M, MD Taking Active Spouse/Significant Other  docusate sodium (COLACE) 100 MG capsule [id number] Yes Take 1 capsule (100 mg total) by mouth 2 (two) times daily.  Patient taking differently: Take 100 mg by mouth 2 (two) times daily as needed for moderate constipation.   Martinique, Peter M, MD Taking Active Spouse/Significant Other           Med Note Wilmon Pali, MELISSA R   Fri Nov 04, 2021 10:22 AM) Pt does take 1 every night at bedtime   famotidine (PEPCID) 20 MG tablet [id number] Yes TAKE 1 TABLET(20 MG) BY MOUTH TWICE DAILY  Patient taking differently: Take 20 mg by mouth at bedtime.   [name], MD Taking Active Spouse/Significant Other  ketoconazole (NIZORAL) 2 % cream [id number] Yes Apply 1 application topically daily as needed for irritation. [provider] Taking Active Spouse/Significant Other           Med Note Wilmon Pali, MELISSA R   Fri Nov 04, 2021 10:33 AM) Wife's Rx  loratadine (CLARITIN) 10 MG tablet [id number] Yes Take 10 mg by mouth daily. [provider] Taking Active Spouse/Significant Other  pantoprazole (PROTONIX) 40 MG tablet [id number] Yes Take 1 tablet (40 mg total) by mouth 2 (two) times daily.  Patient taking differently: Take 40 mg by mouth every morning.   Martinique, Peter M, MD Taking Active Spouse/Significant Other  polyethylene glycol powder (GLYCOLAX/MIRALAX) 17 GM/SCOOP powder [id number] Yes  TAKE 17 GRAMS BY MOUTH TWICE DAILY  Patient taking differently: Take 17 g by mouth at bedtime.   Mansouraty, Telford Nab., MD Taking Active Spouse/Significant Other  potassium chloride (KLOR-CON) 10 MEQ tablet [id number] Yes TAKE 1 TABLET BY MOUTH ONCE A DAY Martinique, Peter M, MD Taking Active Spouse/Significant Other  sildenafil (REVATIO) 20 MG tablet [id number] Yes Take 2 to 5 tablets as needed Martinique, Peter M, MD Taking Active Spouse/Significant Other           Med Note Wilmon Pali, MELISSA R   Fri Oct 29, 2020  2:59 PM) ON HOLD   simvastatin (ZOCOR) 10 MG tablet [id number] Yes TAKE 1 TABLET BY MOUTH ONCE A DAY AT [facility] Martinique, Peter M, MD Taking Active   tamsulosin (FLOMAX) 0.4 MG CAPS capsule [id number] Yes TAKE 1 CAPSULE BY MOUTH ONCE DAILY AFTERSUPPER Martinique, Peter M, MD Taking Active   traMADol (ULTRAM) 50 MG tablet [id number] Yes Take 1-2 tablets (50-100 mg total) by mouth every 6 (six) hours as needed for moderate pain. 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   Fatigue 03/14/2021   Primary osteoarthritis of right hip 11/10/2020   Pre-op evaluation 11/04/2020  Smokeless tobacco use 11/04/2020   History of urinary retention 11/04/2020   Chronic constipation 01/21/2020   Nephrolithiasis 11/14/2018   Acquired complex renal cyst 11/14/2018   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   Lower abdominal pain 07/24/2018   Unintentional weight loss 07/24/2018   Vitamin B12 deficiency 01/21/2018   Acute sinusitis 04/13/2017   Actinic keratosis 01/16/2017   Medicare annual wellness visit, initial 01/03/2017   Counseling about travel 01/03/2017   Advanced care  planning/counseling discussion 01/03/2017   Changing skin lesion 12/25/2016   Bilateral impacted cerumen 07/25/2016   Vocal cord leukoplakia    Overweight (BMI 25.0-29.9) 02/22/2016   Paresthesias 02/22/2016   GERD (gastroesophageal reflux disease)    OA (osteoarthritis) of hip    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   Hyperlipidemia    Essential hypertension     Immunization History  Administered Date(s) Administered   Hepatitis A 10/03/2011, 04/10/2012   Influenza, High Dose Seasonal PF 08/27/2017, 08/21/2019, 10/26/2020   Influenza,inj,Quad PF,6+ Mos 07/24/2018   Influenza-Unspecified 08/23/2016   Moderna Sars-Covid-2 Vaccination 11/05/2019, 12/03/2019, 10/19/2020   Pneumococcal Conjugate-13 01/03/2017   Pneumococcal Polysaccharide-23 10/11/2011   Tdap 10/11/2011   Typhoid Inactivated 05/18/2017   Yellow Fever 05/18/2017   Zoster, Live 10/24/2011    Conditions to be addressed/monitored:  Hypertension, Hyperlipidemia, GERD, Osteoarthritis, Overactive Bladder, and BPH  Care Plan : [place]  Updates made by [name], [place] since 08/02/2022 12:00 AM     Problem: Hypertension, Hyperlipidemia, GERD, Osteoarthritis, Overactive Bladder, and BPH   Priority: High     Long-Range Goal: Disease management   Start Date: 08/05/2021  Expected End Date: 08/02/2022  This Visit's Progress: On track  Recent Progress: On track  Priority: High  Note:   Current Barriers:  None identified  Pharmacist Clinical Goal(s):  Patient will contact provider office for questions/concerns as evidenced notation of same in electronic health record through collaboration with PharmD and provider.   Interventions: 1:1 collaboration with [name], MD regarding development and update of comprehensive plan of care as evidenced by provider attestation  and co-signature Inter-disciplinary care team collaboration (see longitudinal plan of care) Comprehensive medication review performed; medication list updated in electronic medical record  Hypertension (BP goal <140/90) -Controlled - Pt reports BP at home generally < 140/90; -Current treatment: Amlodipine 2.5 mg BID - Appropriate, Effective, Safe, Accessible Benazepril 20 mg BID -Appropriate, Effective, Safe, Accessible -Medications previously tried: HCTZ -Educated on BP goals and benefits of medications for prevention of heart attack, stroke and kidney damage; Importance of home blood pressure monitoring; -Counseled that amlodipine can cause swelling; if swelling becomes more bothersome he can notify PCP or cardiologist to adjust medications -Counseled to monitor BP at home daily -Recommend to continue current medication  Hyperlipidemia: (LDL goal < 100) -Controlled - LDL 84 (08/2021) at goal; pt endorses compliance with statin and denies issues -Current treatment: Simvastatin 10 mg daily -Appropriate, Effective, Safe, Accessible -Educated on Cholesterol goals; Benefits of statin for ASCVD risk reduction; -Recommended to continue current medication  GERD (Goal: manage symptoms) -Controlled - Patient is satisfied with current regimen and denies issues -Current treatment  Pantoprazole 40 mg BID -Appropriate, Effective, Safe, Accessible Famotidine 20 mg BID -Appropriate, Effective, Safe, Accessible -Recommended to continue current medication  Chronic constipation (Goal: manage symptoms) -Not ideally controlled -pt reports constipation symptoms regularly -Current treatment  Docusate 100 mg BID -Appropriate, Effective,  Safe, Accessible Miralax BID -Appropriate, Effective, Safe, Accessible -Counseled on importance of hydration, dietary fiber -Counseled that daily Benadryl may be contributing due to anticholinergic effects -Advised to stop daily Benadryl, use Claritin instead  BPH /  OAB (Goal: manage symptoms) -Not ideally controlled - pt reports urinary issues are exacerbated by HCTZ; he is not sure if Myrbetriq is helping -Follows with urology (Dr Jeffie Pollock) -Current treatment  Tamsulosin 0.4 mg daily -Appropriate, Effective, Safe, Accessible -Medications previously tried: trospium (SE), Myrbetriq -Counseled on benefits of tamsulosin for long term control of BPH -Recommend trial off Myrbetriq to see if there is a change in urinary symptoms (note BP may improve off of Myrbetriq)  Osteoarthritis (Goal: manage pain) -Not ideally controlled - pt reports issues with knees, he is planning for a knee replacement next year; he reports alternating Aleve and Tylenol for pain control -follows with ortho ([name]); receiving injections -Current treatment  Tylenol 650 mg - 2 daily PM -Appropriate, Effective, Safe, Accessible Aleve OTC - 2 daily AM -Appropriate, Effective, Query Safe -Medications previously tried: meloxicam  -Counseled on NSAID risks - GI bleeding, hypertension, kidney damage; pt endorses aleve is the only thing that helps, Tylenol does not help much; he reports his cardiologist is aware he is taking Aleve and is ok with it; he is taking a daily PPI which offers some protection against GI bleeding -Advised to use lowest effective dose of Aleve  Health Maintenance -Vaccine gaps: Shingrix, Flu, TDAP  Patient Goals/Self-Care Activities Patient will:  - take medications as prescribed focus on medication adherence by pill box check blood pressure daily -Use lowest effective dose of Aleve       Medication Assistance: None required.  Patient affirms current coverage meets needs.  Compliance/Adherence/Medication fill history: Care Gaps: None  Star-Rating Drugs: Benazepril - PDC 98% Simvastatin - PDC 86%  Medication Access: Within the past 30 days, how often has patient missed a dose of medication? 0 Is a pillbox or other method used to improve adherence?  Yes  Factors that may affect medication adherence? no barriers identified Are meds synced by current pharmacy? No  Are meds delivered by current pharmacy? No  Does patient experience delays in picking up medications due to transportation concerns? No   [facility] Reviewed: Is patient disadvantaged to use [facility]?: Yes  Current Rx insurance plan: [employer] [place] Name and location of Current pharmacy:  [facility], [place] [address] [place] [address] Phone: [phone] Fax: [phone]  [facility] services reviewed with patient today?: No  Patient requests to transfer care to [facility]?: No  Reason patient declined to change pharmacies: Disadvantaged due to insurance/mail order   Care Plan and Follow Up Patient Decision:  Patient agrees to Care Plan and Follow-up.  Plan: The patient has been provided with contact information for the care management team and has been advised to call with any health related questions or concerns.   [name], PharmD, Para [place], CPP Clinical Pharmacist [facility] [phone]

## 2022-08-16 NOTE — Progress Notes (Addendum)
Adrian Davis Date of Birth: [birth year] Medical Record #[id number]  History of Present Illness: Adrian Davis is seen today for pre op clearance for left TKR. He has a history of HTN and HLD. No known CAD. He had a normal Adenosine Myoview back in 2008. His last echo was in January of 2012 showing grade 1 diastolic dysfunction, mild LVH and a normal EF.    He  underwent right THR on 11/10/20 by [name]. 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 He has spinal stenosis. Has seen Dr Arnoldo Morale and surgery recommended for this as well. Patient doesn't want a plate placed because he was told he wouldn't be able to tie his shoes.   He did undergo a left TKR on 11/21/21.   He is doing well from a cardiac standpoint.  Denies any chest pain, SOB, palpitations, dizziness. Still having a lot of back pain and going down his leg. Planning to get an epidural. He has some swelling in his legs on higher amlodipine dose but BP is high. Fairly high salt intake. Eats nabs and beanie wienies for lunch.   Current Outpatient Medications on File Prior to Visit  Medication Sig Dispense Refill   acetaminophen (TYLENOL) 650 MG CR tablet Take 1,300 mg by mouth every 8 (eight) hours as needed for pain.     amLODipine (NORVASC) 2.5 MG tablet TAKE 2 TABLETS BY MOUTH EVERY MORNING AND 1 TABLET IN THE AFTERNOON AS DIRECTED 270 tablet 3   aspirin 81 MG chewable tablet Chew by mouth daily.     docusate sodium (COLACE) 100 MG capsule Take 1 capsule (100 mg total) by mouth 2 (two) times daily. (Patient taking differently: Take 100 mg by mouth 2 (two) times daily as needed for moderate constipation.) 180 capsule 3   famotidine (PEPCID) 20 MG tablet TAKE 1 TABLET BY MOUTH TWICE A DAY 180 tablet 0   ketoconazole (NIZORAL) 2 % cream Apply 1 application topically daily as needed for irritation.     loratadine (CLARITIN) 10 MG tablet Take 10 mg by mouth daily.     naproxen sodium (ALEVE) 220 MG tablet Take 220 mg by mouth daily as needed.      pantoprazole (PROTONIX) 40 MG tablet TAKE 1 TABLET BY MOUTH TWICE A DAY 180 tablet 2   polyethylene glycol powder (GLYCOLAX/MIRALAX) 17 GM/SCOOP powder TAKE 17 GRAMS BY MOUTH TWICE DAILY (Patient taking differently: Take 17 g by mouth at bedtime.) 510 g 2   potassium chloride (KLOR-CON) 10 MEQ tablet TAKE 1 TABLET BY MOUTH ONCE A DAY 90 tablet 3   sildenafil (REVATIO) 20 MG tablet Take 2 to 5 tablets as needed 50 tablet 3   simvastatin (ZOCOR) 10 MG tablet TAKE 1 TABLET BY MOUTH ONCE A DAY AT SIXIN THE EVENING 90 tablet 3   tamsulosin (FLOMAX) 0.4 MG CAPS capsule TAKE 1 CAPSULE BY MOUTH ONCE DAILY AFTERSUPPER 90 capsule 3   traMADol (ULTRAM) 50 MG tablet Take 1-2 tablets (50-100 mg total) by mouth every 6 (six) hours as needed for moderate pain. 40 tablet 0   No current facility-administered medications on file prior to visit.    No Known Allergies  Past Medical History:  Diagnosis Date   Abnormal echocardiogram Jan 2012   grade 1 diastolic dysfunction, normal EF, mild LVH   Anemia    Basal cell carcinoma 04/08/2019   left nose inferior to nasal alar groove. EDC: 05/20/2019   Bell palsy 10/2010   presented with  facial numbness   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    Degenerative disk disease    GERD (gastroesophageal reflux disease)    History of kidney stones    Hyperlipidemia    Hypertension    dr Felma Pfefferle [place]   Kidney cysts    Lumbar stenosis    Nephrolithiasis    Normal nuclear stress test 2008   Osteoarthritis    Skin cancer    Vocal cord leukoplakia    Dr Erik Obey    Past Surgical History:  Procedure Laterality Date   COLONOSCOPY  04/2019   TAx2, hemorrhoids, rpt 65yr ([place])   ESOPHAGOGASTRODUODENOSCOPY  04/2019   erosive gastropathy, biopsy negative for barrett's ([place])   KNEE ARTHROSCOPY Right    LUMBAR LAMINECTOMY/DECOMPRESSION MICRODISCECTOMY N/A 11/12/2013   JOphelia Charter MD - complicated by urinary retention    MULTIPLE TOOTH EXTRACTIONS     SHOULDER SURGERY Bilateral    TONSILLECTOMY     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   Procedure: TOTAL HIP ARTHROPLASTY ANTERIOR APPROACH;  Surgeon: AGaynelle Arabian MD;  Location: [facility];  Service: Orthopedics;  Laterality: Right;  1087m   TOTAL KNEE ARTHROPLASTY Left 11/21/2021   Procedure: TOTAL KNEE ARTHROPLASTY;  Surgeon: AlGaynelle ArabianMD;  Location: [facility];  Service: Orthopedics;  Laterality: Left;    Social History   Tobacco Use  Smoking Status Former   Packs/day: 1.50   Years: 10.00   Total pack years: 15.00   Types: Cigarettes   Quit date: 05/15/1976   Years since quitting: 46.3  Smokeless Tobacco Current   Types: Chew  Tobacco Comments   chews tobacco    Social History   Substance and Sexual Activity  Alcohol Use Yes   Comment: rare    Family History  Problem Relation Age of Onset   Heart failure Father 8855 Hypertension Father    Arthritis Father    Anemia Mother        needed regular transfusions   Prostate cancer Son    Cancer Neg Hx    Diabetes Neg Hx    Colon cancer Neg Hx    Rectal cancer Neg Hx    Stomach cancer Neg Hx    Inflammatory bowel disease Neg Hx    Liver disease Neg Hx    Pancreatic cancer Neg Hx     Review of Systems: The review of systems is per the HPI.  All other systems were reviewed and are negative.  Physical Exam: BP 138/64   Pulse 74   Ht 5' 7.5" (1.715 m)   Wt 176 lb 9.6 oz (80.1 kg)   SpO2 97%   BMI 27.25 kg/m  GENERAL:  Well appearing WM in NAD HEENT:  PERRL, EOMI, sclera are clear. Oropharynx is clear. NECK:  No jugular venous distention, carotid upstroke brisk and symmetric, no bruits, no thyromegaly or adenopathy LUNGS:  Clear to auscultation bilaterally CHEST:  Unremarkable HEART:  RRR,  PMI not displaced or sustained,S1 and S2 within normal limits, no S3, no S4: no clicks, no rubs, no murmurs ABD:  Soft, nontender. BS +, no masses or bruits. No hepatomegaly, no  splenomegaly EXT:  2 + pulses throughout, no edema, no cyanosis no clubbing SKIN:  Warm and dry.  No rashes NEURO:  Alert and oriented x 3. Cranial nerves II through XII intact. PSYCH:  Cognitively intact   Laboratory data: Lab Results  Component Value Date   WBC 6.3 08/24/2022   HGB 14.3 08/24/2022  HCT 43.9 08/24/2022   PLT 251 08/24/2022   GLUCOSE 98 08/24/2022   CHOL 149 08/24/2022   TRIG 183 (H) 08/24/2022   HDL 35 (L) 08/24/2022   LDLDIRECT 102.0 05/15/2011   LDLCALC 83 08/24/2022   ALT 11 08/24/2022   AST 14 08/24/2022   NA 142 08/24/2022   K 4.5 08/24/2022   CL 105 08/24/2022   CREATININE 0.89 08/24/2022   BUN 15 08/24/2022   CO2 24 08/24/2022   TSH 1.86 03/14/2021   PSA 1.50 04/01/2019   INR 1.0 11/14/2021   HGBA1C 5.6 11/11/2021   Ecg  today shows NSR rate 74. LVH with nonspecific TWA. I have personally reviewed and interpreted this study.   Assessment / Plan: 1. HTN well controlled.  Continue amlodipine and lotensin.   2. Hyperlipidemia. Labs look good. Continue current therapy. Needs to eat a healthier diet and less sodium and fat.    Follow up in 6 months

## 2022-08-21 ENCOUNTER — Telehealth: Payer: Self-pay | Admitting: Cardiology

## 2022-08-21 NOTE — Addendum Note (Signed)
Addended by: Levonne Hubert on: 08/21/2022 12:07 PM   Modules accepted: Orders

## 2022-08-21 NOTE — Telephone Encounter (Signed)
Pt's wife is requesting call back to discuss orders for labs that need to be sent to [facility]. Requesting call back.

## 2022-08-21 NOTE — Telephone Encounter (Signed)
Labs reentered and lab sheets mailed to pt.

## 2022-08-22 ENCOUNTER — Ambulatory Visit (INDEPENDENT_AMBULATORY_CARE_PROVIDER_SITE_OTHER): Payer: Medicare Other

## 2022-08-22 VITALS — Ht 67.5 in | Wt 176.0 lb

## 2022-08-22 DIAGNOSIS — Z0000 Encounter for general adult medical examination without abnormal findings: Secondary | ICD-10-CM

## 2022-08-22 NOTE — Patient Instructions (Addendum)
Mr. Adrian Davis , Thank you for taking time to come for your Medicare Wellness Visit. I appreciate your ongoing commitment to your health goals. Please review the following plan we discussed and let me know if I can assist you in the future.   These are the goals we discussed:  Goals       Manage My Medicine      Timeframe:  Long-Range Goal Priority:  Medium Start Date:    08/05/21                         Expected End Date: 08/05/22                      Follow Up Date April 2023   - call for medicine refill 2 or 3 days before it runs out - call if I am sick and can't take my medicine - keep a list of all the medicines I take; vitamins and herbals too - use a pillbox to sort medicine  -Trial off of Myrbetriq to see if it is helping -Stop Benadryl and take Claritin instead -Use lowest effective dose of Aleve   Why is this important?   These steps will help you keep on track with your medicines.   Notes:       No current goals (pt-stated)        This is a list of the screening recommended for you and due dates:  Health Maintenance  Topic Date Due   COVID-19 Vaccine (4 - Moderna risk series) 09/07/2022*   Zoster (Shingles) Vaccine (1 of 2) 11/22/2022*   Flu Shot  01/21/2023*   Tetanus Vaccine  08/23/2023*   Medicare Annual Wellness Visit  08/23/2023   Colon Cancer Screening  05/14/2026   Pneumonia Vaccine  Completed   HPV Vaccine  Aged Out  *Topic was postponed. The date shown is not the original due date.  Opioid Pain Medicine Management Opioids are powerful medicines that are used to treat moderate to severe pain. When used for short periods of time, they can help you to: Sleep better. Do better in physical or occupational therapy. Feel better in the first few days after an injury. Recover from surgery. Opioids should be taken with the supervision of a trained health care provider. They should be taken for the shortest period of time possible. This is because opioids can be  addictive, and the longer you take opioids, the greater your risk of addiction. This addiction can also be called opioid use disorder. What are the risks? Using opioid pain medicines for longer than 3 days increases your risk of side effects. Side effects include: Constipation. Nausea and vomiting. Breathing difficulties (respiratory depression). Drowsiness. Confusion. Opioid use disorder. Itching. Taking opioid pain medicine for a long period of time can affect your ability to do daily tasks. It also puts you at risk for: Motor vehicle crashes. Depression. Suicide. Heart attack. Overdose, which can be life-threatening. What is a pain treatment plan? A pain treatment plan is an agreement between you and your health care provider. Pain is unique to each person, and treatments vary depending on your condition. To manage your pain, you and your health care provider need to work together. To help you do this: Discuss the goals of your treatment, including how much pain you might expect to have and how you will manage the pain. Review the risks and benefits of taking opioid medicines. Remember that a good  treatment plan uses more than one approach and minimizes the chance of side effects. Be honest about the amount of medicines you take and about any drug or alcohol use. Get pain medicine prescriptions from only one health care provider. Pain can be managed with many types of alternative treatments. Ask your health care provider to refer you to one or more specialists who can help you manage pain through: Physical or occupational therapy. Counseling (cognitive behavioral therapy). Good nutrition. Biofeedback. Massage. Meditation. Non-opioid medicine. Following a gentle exercise program. How to use opioid pain medicine Taking medicine Take your pain medicine exactly as told by your health care provider. Take it only when you need it. If your pain gets less severe, you may take less than  your prescribed dose if your health care provider approves. If you are not having pain, do nottake pain medicine unless your health care provider tells you to take it. If your pain is severe, do nottry to treat it yourself by taking more pills than instructed on your prescription. Contact your health care provider for help. Write down the times when you take your pain medicine. It is easy to become confused while on pain medicine. Writing the time can help you avoid overdose. Take other over-the-counter or prescription medicines only as told by your health care provider. Keeping yourself and others safe  While you are taking opioid pain medicine: Do not drive, use machinery, or power tools. Do not sign legal documents. Do not drink alcohol. Do not take sleeping pills. Do not supervise children by yourself. Do not do activities that require climbing or being in high places. Do not go to a lake, river, ocean, spa, or swimming pool. Do not share your pain medicine with anyone. Keep pain medicine in a locked cabinet or in a secure area where pets and children cannot reach it. Stopping your use of opioids If you have been taking opioid medicine for more than a few weeks, you may need to slowly decrease (taper) how much you take until you stop completely. Tapering your use of opioids can decrease your risk of symptoms of withdrawal, such as: Pain and cramping in the abdomen. Nausea. Sweating. Sleepiness. Restlessness. Uncontrollable shaking (tremors). Cravings for the medicine. Do not attempt to taper your use of opioids on your own. Talk with your health care provider about how to do this. Your health care provider may prescribe a step-down schedule based on how much medicine you are taking and how long you have been taking it. Getting rid of leftover pills Do not save any leftover pills. Get rid of leftover pills safely by: Taking the medicine to a prescription take-back program. This is  usually offered by the county or law enforcement. Bringing them to a pharmacy that has a drug disposal container. Flushing them down the toilet. Check the label or package insert of your medicine to see whether this is safe to do. Throwing them out in the trash. Check the label or package insert of your medicine to see whether this is safe to do. If it is safe to throw it out, remove the medicine from the original container, put it into a sealable bag or container, and mix it with used coffee grounds, food scraps, dirt, or cat litter before putting it in the trash. Follow these instructions at home: Activity Do exercises as told by your health care provider. Avoid activities that make your pain worse. Return to your normal activities as told by your health care  provider. Ask your health care provider what activities are safe for you. General instructions You may need to take these actions to prevent or treat constipation: Drink enough fluid to keep your urine pale yellow. Take over-the-counter or prescription medicines. Eat foods that are high in fiber, such as beans, whole grains, and fresh fruits and vegetables. Limit foods that are high in fat and processed sugars, such as fried or sweet foods. Keep all follow-up visits. This is important. Where to find support If you have been taking opioids for a long time, you may benefit from receiving support for quitting from a local support group or counselor. Ask your health care provider for a referral to these resources in your area. Where to find more information Centers for Disease Control and Prevention (CDC): http://www.wolf.info/ [place] Food and Drug Administration (FDA): [employer] Get help right away if: You may have taken too much of an opioid (overdosed). Common symptoms of an overdose: Your breathing is slower or more shallow than normal. You have a very slow heartbeat (pulse). You have slurred speech. You have nausea and vomiting. Your pupils  become very small. You have other potential symptoms: You are very confused. You faint or feel like you will faint. You have cold, clammy skin. You have blue lips or fingernails. You have thoughts of harming yourself or harming others. These symptoms may represent a serious problem that is an emergency. Do not wait to see if the symptoms will go away. Get medical help right away. Call your local emergency services (911 in the [place] Do not drive yourself to the hospital.  If you ever feel like you may hurt yourself or others, or have thoughts about taking your own life, get help right away. Go to your nearest emergency department or: Call your local emergency services (911 in the [place] Call the [facility] 424 603 6540 in the [place] Call a suicide crisis helpline, such as the [facility] at 6053723469 or 988 in the [place]. This is open 24 hours a day in the [place] Text the Crisis Text Line at (713)379-6467 (in the [place] Summary Opioid medicines can help you manage moderate to severe pain for a short period of time. A pain treatment plan is an agreement between you and your health care provider. Discuss the goals of your treatment, including how much pain you might expect to have and how you will manage the pain. If you think that you or someone else may have taken too much of an opioid, get medical help right away. This information is not intended to replace advice given to you by your health care provider. Make sure you discuss any questions you have with your health care provider. Document Revised: 05/04/2021 Document Reviewed: 01/19/2021 Elsevier Patient Education  [place] directives: In chart  Conditions/risks identified: None  Next appointment: Follow up in one year for your annual wellness visit.    Preventive Care 79 Years and Older, Male  Preventive care refers to lifestyle choices and visits with your health care  provider that can promote health and wellness. What does preventive care include? A yearly physical exam. This is also called an annual well check. Dental exams once or twice a year. Routine eye exams. Ask your health care provider how often you should have your eyes checked. Personal lifestyle choices, including: Daily care of your teeth and gums. Regular physical activity. Eating a healthy diet. Avoiding tobacco and drug use. Limiting alcohol use. Practicing  safe sex. Taking low doses of aspirin every day. Taking vitamin and mineral supplements as recommended by your health care provider. What happens during an annual well check? The services and screenings done by your health care provider during your annual well check will depend on your age, overall health, lifestyle risk factors, and family history of disease. Counseling  Your health care provider may ask you questions about your: Alcohol use. Tobacco use. Drug use. Emotional well-being. Home and relationship well-being. Sexual activity. Eating habits. History of falls. Memory and ability to understand (cognition). Work and work Statistician. Screening  You may have the following tests or measurements: Height, weight, and BMI. Blood pressure. Lipid and cholesterol levels. These may be checked every 5 years, or more frequently if you are over 35 years old. Skin check. Lung cancer screening. You may have this screening every year starting at age 25 if you have a 30-pack-year history of smoking and currently smoke or have quit within the past 15 years. Fecal occult blood test (FOBT) of the stool. You may have this test every year starting at age 36. Flexible sigmoidoscopy or colonoscopy. You may have a sigmoidoscopy every 5 years or a colonoscopy every 10 years starting at age 57. Prostate cancer screening. Recommendations will vary depending on your family history and other risks. Hepatitis C blood test. Hepatitis B blood  test. Sexually transmitted disease (STD) testing. Diabetes screening. This is done by checking your blood sugar (glucose) after you have not eaten for a while (fasting). You may have this done every 1-3 years. Abdominal aortic aneurysm (AAA) screening. You may need this if you are a current or former smoker. Osteoporosis. You may be screened starting at age 62 if you are at high risk. Talk with your health care provider about your test results, treatment options, and if necessary, the need for more tests. Vaccines  Your health care provider may recommend certain vaccines, such as: Influenza vaccine. This is recommended every year. Tetanus, diphtheria, and acellular pertussis (Tdap, Td) vaccine. You may need a Td booster every 10 years. Zoster vaccine. You may need this after age 34. Pneumococcal 13-valent conjugate (PCV13) vaccine. One dose is recommended after age 96. Pneumococcal polysaccharide (PPSV23) vaccine. One dose is recommended after age 59. Talk to your health care provider about which screenings and vaccines you need and how often you need them. This information is not intended to replace advice given to you by your health care provider. Make sure you discuss any questions you have with your health care provider. Document Released: 11/05/2015 Document Revised: 06/28/2016 Document Reviewed: 08/10/2015 Elsevier Interactive Patient Education  2017 [place] Prevention in the Home Falls can cause injuries. They can happen to people of all ages. There are many things you can do to make your home safe and to help prevent falls. What can I do on the outside of my home? Regularly fix the edges of walkways and driveways and fix any cracks. Remove anything that might make you trip as you walk through a door, such as a raised step or threshold. Trim any bushes or trees on the path to your home. Use bright outdoor lighting. Clear any walking paths of anything that might make  someone trip, such as rocks or tools. Regularly check to see if handrails are loose or broken. Make sure that both sides of any steps have handrails. Any raised decks and porches should have guardrails on the edges. Have any leaves, snow, or ice cleared  regularly. Use sand or salt on walking paths during winter. Clean up any spills in your garage right away. This includes oil or grease spills. What can I do in the bathroom? Use night lights. Install grab bars by the toilet and in the tub and shower. Do not use towel bars as grab bars. Use non-skid mats or decals in the tub or shower. If you need to sit down in the shower, use a plastic, non-slip stool. Keep the floor dry. Clean up any water that spills on the floor as soon as it happens. Remove soap buildup in the tub or shower regularly. Attach bath mats securely with double-sided non-slip rug tape. Do not have throw rugs and other things on the floor that can make you trip. What can I do in the bedroom? Use night lights. Make sure that you have a light by your bed that is easy to reach. Do not use any sheets or blankets that are too big for your bed. They should not hang down onto the floor. Have a firm chair that has side arms. You can use this for support while you get dressed. Do not have throw rugs and other things on the floor that can make you trip. What can I do in the kitchen? Clean up any spills right away. Avoid walking on wet floors. Keep items that you use a lot in easy-to-reach places. If you need to reach something above you, use a strong step stool that has a grab bar. Keep electrical cords out of the way. Do not use floor polish or wax that makes floors slippery. If you must use wax, use non-skid floor wax. Do not have throw rugs and other things on the floor that can make you trip. What can I do with my stairs? Do not leave any items on the stairs. Make sure that there are handrails on both sides of the stairs and  use them. Fix handrails that are broken or loose. Make sure that handrails are as long as the stairways. Check any carpeting to make sure that it is firmly attached to the stairs. Fix any carpet that is loose or worn. Avoid having throw rugs at the top or bottom of the stairs. If you do have throw rugs, attach them to the floor with carpet tape. Make sure that you have a light switch at the top of the stairs and the bottom of the stairs. If you do not have them, ask someone to add them for you. What else can I do to help prevent falls? Wear shoes that: Do not have high heels. Have rubber bottoms. Are comfortable and fit you well. Are closed at the toe. Do not wear sandals. If you use a stepladder: Make sure that it is fully opened. Do not climb a closed stepladder. Make sure that both sides of the stepladder are locked into place. Ask someone to hold it for you, if possible. Clearly mark and make sure that you can see: Any grab bars or handrails. First and last steps. Where the edge of each step is. Use tools that help you move around (mobility aids) if they are needed. These include: Canes. Walkers. Scooters. Crutches. Turn on the lights when you go into a dark area. Replace any light bulbs as soon as they burn out. Set up your furniture so you have a clear path. Avoid moving your furniture around. If any of your floors are uneven, fix them. If there are any pets around you,  be aware of where they are. Review your medicines with your doctor. Some medicines can make you feel dizzy. This can increase your chance of falling. Ask your doctor what other things that you can do to help prevent falls. This information is not intended to replace advice given to you by your health care provider. Make sure you discuss any questions you have with your health care provider. Document Released: 08/05/2009 Document Revised: 03/16/2016 Document Reviewed: 11/13/2014 Elsevier Interactive Patient  Education  2017 [employer].

## 2022-08-22 NOTE — Progress Notes (Signed)
Subjective:   Adrian Davis is a 81 y.o. male who presents for Medicare Annual/Subsequent preventive examination.  Review of Systems    Virtual Visit via Telephone Note  I connected with  Adrian Davis on 08/22/22 at  8:30 AM EDT by telephone and verified that I am speaking with the correct person using two identifiers.  Location: Patient: Home Provider: Office Persons participating in the virtual visit: [name]   I discussed the limitations, risks, security and privacy concerns of performing an evaluation and management service by telephone and the availability of in person appointments. The patient expressed understanding and agreed to proceed.  Interactive audio and video telecommunications were attempted between this nurse and patient, however failed, due to patient having technical difficulties OR patient did not have access to video capability.  We continued and completed visit with audio only.  Some vital signs may be absent or patient reported.   Adrian Peaches, LPN  Cardiac Risk Factors include: advanced age (>5mn, >>5women);hypertension;male gender     Objective:    Today's Vitals   08/22/22 0837  Weight: 176 lb (79.8 kg)  Height: 5' 7.5" (1.715 m)   Body mass index is 27.16 kg/m.     08/22/2022    8:45 AM 11/21/2021   12:02 PM 11/14/2021    9:12 AM 11/10/2020    2:57 PM 11/05/2020   10:22 AM 11/06/2013    9:15 AM  Advanced Directives  Does Patient Have a Medical Advance Directive? Yes Yes Yes Yes Yes   Type of AParamedicof ABondLiving will [place]mandaLiving will [place] will Healthcare Power of [place]  Does patient want to make changes to medical advance directive? No - Patient declined No - Patient declined  No - Patient declined    Copy of [place] Chart? Yes - validated most recent copy scanned in chart  (See row information) No - copy requested No - copy requested No - copy requested No - copy requested   Pre-existing out of facility DNR order (yellow form or pink MOST form)      No    Current Medications (verified) Outpatient Encounter Medications as of 08/22/2022  Medication Sig   acetaminophen (TYLENOL) 650 MG CR tablet Take 1,300 mg by mouth every 8 (eight) hours as needed for pain.   amLODipine (NORVASC) 2.5 MG tablet TAKE 2 TABLETS BY MOUTH EVERY MORNING AND 1 TABLET IN THE AFTERNOON AS DIRECTED   aspirin 81 MG chewable tablet Chew by mouth daily.   benazepril (LOTENSIN) 20 MG tablet TAKE 1 TABLET BY MOUTH TWICE A DAY   docusate sodium (COLACE) 100 MG capsule Take 1 capsule (100 mg total) by mouth 2 (two) times daily. (Patient taking differently: Take 100 mg by mouth 2 (two) times daily as needed for moderate constipation.)   famotidine (PEPCID) 20 MG tablet TAKE 1 TABLET BY MOUTH TWICE A DAY   ketoconazole (NIZORAL) 2 % cream Apply 1 application topically daily as needed for irritation.   loratadine (CLARITIN) 10 MG tablet Take 10 mg by mouth daily.   naproxen sodium (ALEVE) 220 MG tablet Take 220 mg by mouth daily as needed.   pantoprazole (PROTONIX) 40 MG tablet TAKE 1 TABLET BY MOUTH TWICE A DAY   polyethylene glycol powder (GLYCOLAX/MIRALAX) 17 GM/SCOOP powder TAKE 17 GRAMS BY MOUTH TWICE DAILY (Patient taking differently: Take 17 g by mouth at  bedtime.)   potassium chloride (KLOR-CON) 10 MEQ tablet TAKE 1 TABLET BY MOUTH ONCE A DAY   sildenafil (REVATIO) 20 MG tablet Take 2 to 5 tablets as needed   simvastatin (ZOCOR) 10 MG tablet TAKE 1 TABLET BY MOUTH ONCE A DAY AT SIXIN THE EVENING   tamsulosin (FLOMAX) 0.4 MG CAPS capsule TAKE 1 CAPSULE BY MOUTH ONCE DAILY AFTERSUPPER   traMADol (ULTRAM) 50 MG tablet Take 1-2 tablets (50-100 mg total) by mouth every 6 (six) hours as needed for moderate pain.   No facility-administered encounter medications on file as of 08/22/2022.     Allergies (verified) Patient has no known allergies.   History: Past Medical History:  Diagnosis Date   Abnormal echocardiogram Jan 2012   grade 1 diastolic dysfunction, normal EF, mild LVH   Anemia    Basal cell carcinoma 04/08/2019   left nose inferior to nasal alar groove. EDC: 05/20/2019   Bell palsy 10/2010   presented with facial numbness   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    Degenerative disk disease    GERD (gastroesophageal reflux disease)    History of kidney stones    Hyperlipidemia    Hypertension    dr [name]   Kidney cysts    Lumbar stenosis    Nephrolithiasis    Normal nuclear stress test 2008   Osteoarthritis    Skin cancer    Vocal cord leukoplakia    Dr Erik Obey   Past Surgical History:  Procedure Laterality Date   COLONOSCOPY  04/2019   TAx2, hemorrhoids, rpt 6yr ([place])   ESOPHAGOGASTRODUODENOSCOPY  04/2019   erosive gastropathy, biopsy negative for barrett's ([place])   KNEE ARTHROSCOPY Right    LUMBAR LAMINECTOMY/DECOMPRESSION MICRODISCECTOMY N/A 11/12/2013   JOphelia Charter MD - complicated by urinary retention   MULTIPLE TOOTH EXTRACTIONS     SHOULDER SURGERY Bilateral    TONSILLECTOMY     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   Procedure: TOTAL HIP ARTHROPLASTY ANTERIOR APPROACH;  Surgeon: AGaynelle Arabian MD;  Location: [facility];  Service: Orthopedics;  Laterality: Right;  1068m   TOTAL KNEE ARTHROPLASTY Left 11/21/2021   Procedure: TOTAL KNEE ARTHROPLASTY;  Surgeon: AlGaynelle ArabianMD;  Location: [facility];  Service: Orthopedics;  Laterality: Left;   Family History  Problem Relation Age of Onset   Heart failure Father 8866 Hypertension Father    Arthritis Father    Anemia Mother        needed regular transfusions   Prostate cancer Son    Cancer Neg Hx    Diabetes Neg Hx    Colon cancer Neg Hx    Rectal cancer Neg Hx    Stomach cancer Neg Hx    Inflammatory bowel disease Neg Hx    Liver  disease Neg Hx    Pancreatic cancer Neg Hx    Social History   Socioeconomic History   Marital status: Married    Spouse name: Not on file   Number of children: 1   Years of education: Not on file   Highest education level: Not on file  Occupational History   Occupation: farmer  Tobacco Use   Smoking status: Former    Packs/day: 1.50    Years: 10.00    Total pack years: 15.00    Types: Cigarettes    Quit date: 05/15/1976    Years since quitting: 46.3   Smokeless tobacco: Current    Types: Chew   Tobacco comments:  chews tobacco  Vaping Use   Vaping Use: Never used  Substance and Sexual Activity   Alcohol use: Yes    Comment: rare   Drug use: No   Sexual activity: Not on file  Other Topics Concern   Not on file  Social History Narrative   Lives with wife   Occ: farm   Edu: HS   Activity: active on farm   Diet: good water, fruits/vegetables daily   Social Determinants of Health   Financial Resource Strain: Low Risk  (08/22/2022)   Overall Financial Resource Strain (CARDIA)    Difficulty of Paying Living Expenses: Not hard at all  Food Insecurity: No Food Insecurity (08/22/2022)   Hunger Vital Sign    Worried About Running Out of Food in the Last Year: Never true    Ran Out of Food in the Last Year: Never true  Transportation Needs: No Transportation Needs (08/22/2022)   PRAPARE - Hydrologist (Medical): No    Lack of Transportation (Non-Medical): No  Physical Activity: Not on file  Stress: No Stress Concern Present (08/22/2022)   [place]    Feeling of Stress : Not at all  Social Connections: [place] (08/22/2022)   Social Connection and Isolation Panel [NHANES]    Frequency of Communication with Friends and Family: More than three times a week    Frequency of Social Gatherings with Friends and Family: More than three times a week    Attends Religious  Services: More than 4 times per year    Active Member of [employer] or Organizations: Yes    Attends Music therapist: More than 4 times per year    Marital Status: Married    Tobacco Counseling Ready to quit: No Counseling given: Yes Tobacco comments: chews tobacco   Clinical Intake:  Pre-visit preparation completed: No  Pain : No/denies pain     BMI - recorded: 27.16 Nutritional Status: BMI 25 -29 Overweight Nutritional Risks: None Diabetes: No  How often do you need to have someone help you when you read instructions, pamphlets, or other written materials from your doctor or pharmacy?: 1 - Never  Diabetic?  No  Interpreter Needed?: No  Information entered by :: Rolene Arbour LPN   Activities of Daily Living    08/22/2022    8:44 AM 11/21/2021   12:02 PM  In your present state of health, do you have any difficulty performing the following activities:  Hearing? 0 1  Vision? 0 0  Difficulty concentrating or making decisions? 0 0  Walking or climbing stairs? 0 0  Dressing or bathing? 0 0  Doing errands, shopping? 0 0  Preparing Food and eating ? N   Using the Toilet? N   In the past six months, have you accidently leaked urine? N   Do you have problems with loss of bowel control? N   Managing your Medications? N   Managing your Finances? N   Housekeeping or managing your Housekeeping? N     Patient Care Team: [name], MD as PCP - General (Family Medicine) [place], Peter M, MD as PCP - Cardiology (Cardiology) [name], [facility] as Pharmacist (Pharmacist)  Indicate any recent Medical Services you may have received from other than [facility] providers in the past year (date may be approximate).     Assessment:   This is a routine wellness examination for [place].  Hearing/Vision screen Hearing Screening -  Comments:: Denies hearing difficulties   Vision Screening - Comments:: Wears rx glasses - up to date with routine eye exams with   [facility]  Dietary issues and exercise activities discussed: Current Exercise Habits: The patient does not participate in regular exercise at present, Exercise limited by: None identified   Goals Addressed               This Visit's Progress     No current goals (pt-stated)         Depression Screen    08/22/2022    8:43 AM 01/03/2017   10:36 AM  PHQ 2/9 Scores  PHQ - 2 Score 0 0    Fall Risk    08/22/2022    8:45 AM 09/09/2018    4:50 PM 01/03/2017   10:36 AM 06/20/2016    5:14 PM  [place] in the past year? 0 0 No No  Comment  [employer] Telephone Survey: data to providers prior to load  [employer] Survey: data to providers prior to load  Number falls in past yr: 0     Injury with Fall? 0     Risk for fall due to : No Fall Risks     Follow up Falls prevention discussed       [place]:  Any stairs in or around the home? No  If so, are there any without handrails? No  Home free of loose throw rugs in walkways, pet beds, electrical cords, etc? Yes  Adequate lighting in your home to reduce risk of falls? Yes   ASSISTIVE DEVICES UTILIZED TO PREVENT FALLS:  Life alert? No  Use of a cane, walker or w/c? No  Grab bars in the bathroom? Yes  Shower chair or bench in shower? Yes  Elevated toilet seat or a handicapped toilet? No   TIMED UP AND GO:  Was the test performed? No .Audio Visit Cognitive Function:        08/22/2022    8:45 AM  6CIT Screen  What Year? 0 points  What month? 0 points  What time? 0 points  Count back from 20 0 points  Months in reverse 0 points  Repeat phrase 0 points  Total Score 0 points    Immunizations Immunization History  Administered Date(s) Administered   Hepatitis A 10/03/2011, 04/10/2012   Influenza, High Dose Seasonal PF 08/27/2017, 08/21/2019, 10/26/2020   Influenza,inj,Quad PF,6+ Mos 07/24/2018   Influenza-Unspecified 08/23/2016   Moderna Sars-Covid-2 Vaccination  11/05/2019, 12/03/2019, 10/19/2020   Pneumococcal Conjugate-13 01/03/2017   Pneumococcal Polysaccharide-23 10/11/2011   Tdap 10/11/2011   Typhoid Inactivated 05/18/2017   Yellow Fever 05/18/2017   Zoster, Live 10/24/2011    TDAP status: Due, Education has been provided regarding the importance of this vaccine. Advised may receive this vaccine at local pharmacy or Health Dept. Aware to provide a copy of the vaccination record if obtained from local pharmacy or Health Dept. Verbalized acceptance and understanding.  Flu Vaccine status: Up to date  Pneumococcal vaccine status: Up to date  Covid-19 vaccine status: Completed vaccines  Qualifies for Shingles Vaccine? Yes   Zostavax completed No   Shingrix Completed?: No.    Education has been provided regarding the importance of this vaccine. Patient has been advised to call insurance company to determine out of pocket expense if they have not yet received this vaccine. Advised may also receive vaccine at local pharmacy or Health Dept. Verbalized acceptance and  understanding.  Screening Tests Health Maintenance  Topic Date Due   COVID-19 Vaccine (4 - Moderna risk series) 09/07/2022 (Originally 12/14/2020)   Zoster Vaccines- Shingrix (1 of 2) 11/22/2022 (Originally 05/25/1960)   INFLUENZA VACCINE  01/21/2023 (Originally 05/23/2022)   TETANUS/TDAP  08/23/2023 (Originally 10/10/2021)   Medicare Annual Wellness (AWV)  08/23/2023   COLONOSCOPY (Pts 45-39yr Insurance coverage will need to be confirmed)  05/14/2026   Pneumonia Vaccine 80 Years old  Completed   HPV VACCINES  Aged Out    Health Maintenance  There are no preventive care reminders to display for this patient.   Colorectal cancer screening: No longer required.   Lung Cancer Screening: (Low Dose CT Chest recommended if Age 80-80years, 30 pack-year currently smoking OR have quit w/in 15years.) does qualify.   Lung Cancer Screening Referral: Patient deferred  Additional  Screening:  Hepatitis C Screening: does not qualify; Completed   Vision Screening: Recommended annual ophthalmology exams for [place] detection of glaucoma and other disorders of the eye. Is the patient up to date with their annual eye exam?  Yes  Who is the provider or what is the name of the office in which the patient attends annual eye exams? [place] is not established with a provider, would they like to be referred to a provider to establish care? No .   Dental Screening: Recommended annual dental exams for proper oral hygiene  Community Resource Referral / Chronic Care Management:  CRR required this visit?  No   CCM required this visit?  No      Plan:     I have personally reviewed and noted the following in the patient's chart:   Medical and social history Use of alcohol, tobacco or illicit drugs  Current medications and supplements including opioid prescriptions. Patient is currently taking opioid prescriptions. Information provided to patient regarding non-opioid alternatives. Patient advised to discuss non-opioid treatment plan with their provider. Functional ability and status Nutritional status Physical activity Advanced directives List of other physicians Hospitalizations, surgeries, and ER visits in previous 12 months Vitals Screenings to include cognitive, depression, and falls Referrals and appointments  In addition, I have reviewed and discussed with patient certain preventive protocols, quality metrics, and best practice recommendations. A written personalized care plan for preventive services as well as general preventive health recommendations were provided to patient.     BCriselda Peaches LPN   [id number]  Nurse Notes: None

## 2022-08-24 DIAGNOSIS — E7800 Pure hypercholesterolemia, unspecified: Secondary | ICD-10-CM | POA: Diagnosis not present

## 2022-08-24 DIAGNOSIS — I10 Essential (primary) hypertension: Secondary | ICD-10-CM | POA: Diagnosis not present

## 2022-08-25 LAB — BASIC METABOLIC PANEL
BUN/Creatinine Ratio: 17 (ref 10–24)
BUN: 15 mg/dL (ref 8–27)
CO2: 24 mmol/L (ref 20–29)
Calcium: 10 mg/dL (ref 8.6–10.2)
Chloride: 105 mmol/L (ref 96–106)
Creatinine, Ser: 0.89 mg/dL (ref 0.76–1.27)
Glucose: 98 mg/dL (ref 70–99)
Potassium: 4.5 mmol/L (ref 3.5–5.2)
Sodium: 142 mmol/L (ref 134–144)
eGFR: 87 mL/min/{1.73_m2} (ref >59)

## 2022-08-25 LAB — HEPATIC FUNCTION PANEL
ALT: 11 IU/L (ref 0–44)
AST: 14 IU/L (ref 0–40)
Albumin: 4.5 g/dL (ref 3.8–4.8)
Alkaline Phosphatase: 71 IU/L (ref 44–121)
Bilirubin Total: 0.3 mg/dL (ref 0.0–1.2)
Bilirubin, Direct: <0.1 mg/dL (ref 0.00–0.40)
Total Protein: 6.7 g/dL (ref 6.0–8.5)

## 2022-08-25 LAB — CBC WITH DIFFERENTIAL/PLATELET
Basophils Absolute: 0 10*3/uL (ref 0.0–0.2)
Basos: 1 %
EOS (ABSOLUTE): 0.1 10*3/uL (ref 0.0–0.4)
Eos: 2 %
Hematocrit: 43.9 % (ref 37.5–51.0)
Hemoglobin: 14.3 g/dL (ref 13.0–17.7)
Immature Grans (Abs): 0 10*3/uL (ref 0.0–0.1)
Immature Granulocytes: 0 %
Lymphocytes Absolute: 2.1 10*3/uL (ref 0.7–3.1)
Lymphs: 33 %
MCH: 30.6 pg (ref 26.6–33.0)
MCHC: 32.6 g/dL (ref 31.5–35.7)
MCV: 94 fL (ref 79–97)
Monocytes Absolute: 0.7 10*3/uL (ref 0.1–0.9)
Monocytes: 11 %
Neutrophils Absolute: 3.4 10*3/uL (ref 1.4–7.0)
Neutrophils: 53 %
Platelets: 251 10*3/uL (ref 150–450)
RBC: 4.67 x10E6/uL (ref 4.14–5.80)
RDW: 12.3 % (ref 11.6–15.4)
WBC: 6.3 10*3/uL (ref 3.4–10.8)

## 2022-08-25 LAB — LIPID PANEL
Chol/HDL Ratio: 4.3 ratio (ref 0.0–5.0)
Cholesterol, Total: 149 mg/dL (ref 100–199)
HDL: 35 mg/dL — ABNORMAL LOW (ref >39)
LDL Chol Calc (NIH): 83 mg/dL (ref 0–99)
Triglycerides: 183 mg/dL — ABNORMAL HIGH (ref 0–149)
VLDL Cholesterol Cal: 31 mg/dL (ref 5–40)

## 2022-08-30 ENCOUNTER — Encounter: Payer: Self-pay | Admitting: Cardiology

## 2022-08-30 ENCOUNTER — Ambulatory Visit: Payer: Medicare Other | Attending: Cardiology | Admitting: Cardiology

## 2022-08-30 VITALS — BP 138/64 | HR 74 | Ht 67.5 in | Wt 176.6 lb

## 2022-08-30 DIAGNOSIS — E7800 Pure hypercholesterolemia, unspecified: Secondary | ICD-10-CM | POA: Insufficient documentation

## 2022-08-30 DIAGNOSIS — I10 Essential (primary) hypertension: Secondary | ICD-10-CM

## 2022-08-30 MED ORDER — BENAZEPRIL HCL 20 MG PO TABS
20.0000 mg | ORAL_TABLET | Freq: Two times a day (BID) | ORAL | 3 refills | Status: DC
Start: 2022-08-30 — End: 2023-08-08

## 2022-09-11 DIAGNOSIS — M5416 Radiculopathy, lumbar region: Secondary | ICD-10-CM | POA: Diagnosis not present

## 2022-09-19 ENCOUNTER — Ambulatory Visit: Payer: Medicare Other | Admitting: Dermatology

## 2022-09-21 ENCOUNTER — Ambulatory Visit (INDEPENDENT_AMBULATORY_CARE_PROVIDER_SITE_OTHER): Payer: Medicare Other | Admitting: Dermatology

## 2022-09-21 DIAGNOSIS — L814 Other melanin hyperpigmentation: Secondary | ICD-10-CM

## 2022-09-21 DIAGNOSIS — L820 Inflamed seborrheic keratosis: Secondary | ICD-10-CM | POA: Diagnosis not present

## 2022-09-21 DIAGNOSIS — L821 Other seborrheic keratosis: Secondary | ICD-10-CM

## 2022-09-21 DIAGNOSIS — L570 Actinic keratosis: Secondary | ICD-10-CM

## 2022-09-21 DIAGNOSIS — L578 Other skin changes due to chronic exposure to nonionizing radiation: Secondary | ICD-10-CM | POA: Diagnosis not present

## 2022-09-21 DIAGNOSIS — Z85828 Personal history of other malignant neoplasm of skin: Secondary | ICD-10-CM

## 2022-09-21 DIAGNOSIS — D229 Melanocytic nevi, unspecified: Secondary | ICD-10-CM

## 2022-09-21 DIAGNOSIS — Z1283 Encounter for screening for malignant neoplasm of skin: Secondary | ICD-10-CM

## 2022-09-21 NOTE — Patient Instructions (Signed)
Due to recent changes in healthcare laws, you may see results of your pathology and/or laboratory studies on MyChart before the doctors have had a chance to review them. We understand that in some cases there may be results that are confusing or concerning to you. Please understand that not all results are received at the same time and often the doctors may need to interpret multiple results in order to provide you with the best plan of care or course of treatment. Therefore, we ask that you please give us 2 business days to thoroughly review all your results before contacting the office for clarification. Should we see a critical lab result, you will be contacted sooner.   If You Need Anything After Your Visit  If you have any questions or concerns for your doctor, please call our main line at [phone] and press option 4 to reach your doctor's medical assistant. If no one answers, please leave a voicemail as directed and we will return your call as soon as possible. Messages left after 4 pm will be answered the following business day.   You may also send us a message via MyChart. We typically respond to MyChart messages within 1-2 business days.  For prescription refills, please ask your pharmacy to contact our office. Our fax number is [phone].  If you have an urgent issue when the clinic is closed that cannot wait until the next business day, you can page your doctor at the number below.    Please note that while we do our best to be available for urgent issues outside of office hours, we are not available 24/7.   If you have an urgent issue and are unable to reach us, you may choose to seek medical care at your doctor's office, retail clinic, urgent care center, or emergency room.  If you have a medical emergency, please immediately call 911 or go to the emergency department.  Pager Numbers  - Dr. Kowalski: [phone]  - Dr. Moye: [phone]  - Dr. Stewart:  [phone]  In the event of inclement weather, please call our main line at [phone] for an update on the status of any delays or closures.  Dermatology Medication Tips: Please keep the boxes that topical medications come in in order to help keep track of the instructions about where and how to use these. Pharmacies typically print the medication instructions only on the boxes and not directly on the medication tubes.   If your medication is too expensive, please contact our office at [phone] option 4 or send us a message through MyChart.   We are unable to tell what your co-pay for medications will be in advance as this is different depending on your insurance coverage. However, we may be able to find a substitute medication at lower cost or fill out paperwork to get insurance to cover a needed medication.   If a prior authorization is required to get your medication covered by your insurance company, please allow us 1-2 business days to complete this process.  Drug prices often vary depending on where the prescription is filled and some pharmacies may offer cheaper prices.  The website www.goodrx.com contains coupons for medications through different pharmacies. The prices here do not account for what the cost may be with help from insurance (it may be cheaper with your insurance), but the website can give you the price if you did not use any insurance.  - You can print the associated coupon and take it with   your prescription to the pharmacy.  - You may also stop by our office during regular business hours and pick up a GoodRx coupon card.  - If you need your prescription sent electronically to a different pharmacy, notify our office through [facility] MyChart or by phone at [phone] option 4.     Si Usted Necesita Algo Despus de Su Visita  Tambin puede enviarnos un mensaje a travs de MyChart. Por lo general respondemos a los mensajes de MyChart en el transcurso de 1 a 2  das hbiles.  Para renovar recetas, por favor pida a su farmacia que se ponga en contacto con nuestra oficina. Nuestro nmero de fax es el [phone].  Si tiene un asunto urgente cuando la clnica est cerrada y que no puede esperar hasta el siguiente da hbil, puede llamar/localizar a su doctor(a) al nmero que aparece a continuacin.   Por favor, tenga en cuenta que aunque hacemos todo lo posible para estar disponibles para asuntos urgentes fuera del horario de oficina, no estamos disponibles las 24 horas del da, los 7 das de la semana.   Si tiene un problema urgente y no puede comunicarse con nosotros, puede optar por buscar atencin mdica  en el consultorio de su doctor(a), en una clnica privada, en un centro de atencin urgente o en una sala de emergencias.  Si tiene una emergencia mdica, por favor llame inmediatamente al 911 o vaya a la sala de emergencias.  Nmeros de bper  - Dr. Kowalski: [phone]  - Dra. Moye: [phone]  - Dra. Stewart: [phone]  En caso de inclemencias del tiempo, por favor llame a nuestra lnea principal al [phone] para una actualizacin sobre el estado de cualquier retraso o cierre.  Consejos para la medicacin en dermatologa: Por favor, guarde las cajas en las que vienen los medicamentos de uso tpico para ayudarle a seguir las instrucciones sobre dnde y cmo usarlos. Las farmacias generalmente imprimen las instrucciones del medicamento slo en las cajas y no directamente en los tubos del medicamento.   Si su medicamento es muy caro, por favor, pngase en contacto con nuestra oficina llamando al [phone] y presione la opcin 4 o envenos un mensaje a travs de MyChart.   No podemos decirle cul ser su copago por los medicamentos por adelantado ya que esto es diferente dependiendo de la cobertura de su seguro. Sin embargo, es posible que podamos encontrar un medicamento sustituto a menor costo o llenar un formulario para que el  seguro cubra el medicamento que se considera necesario.   Si se requiere una autorizacin previa para que su compaa de seguros cubra su medicamento, por favor permtanos de 1 a 2 das hbiles para completar este proceso.  Los precios de los medicamentos varan con frecuencia dependiendo del lugar de dnde se surte la receta y alguna farmacias pueden ofrecer precios ms baratos.  El sitio web www.goodrx.com tiene cupones para medicamentos de diferentes farmacias. Los precios aqu no tienen en cuenta lo que podra costar con la ayuda del seguro (puede ser ms barato con su seguro), pero el sitio web puede darle el precio si no utiliz ningn seguro.  - Puede imprimir el cupn correspondiente y llevarlo con su receta a la farmacia.  - Tambin puede pasar por nuestra oficina durante el horario de atencin regular y recoger una tarjeta de cupones de GoodRx.  - Si necesita que su receta se enve electrnicamente a una farmacia diferente, informe a nuestra oficina a travs de MyChart de [facility]   o por telfono llamando al [phone] y presione la opcin 4.  

## 2022-09-21 NOTE — Progress Notes (Signed)
Follow-Up Visit   Subjective  Adrian Davis is a 81 y.o. male who presents for the following: UBSE (Hx of Aks pt's wife would like his back checked today as she has noticed some irregular appearing lesions). The patient presents for Upper Body Skin Exam (UBSE) for skin cancer screening and mole check.  The patient has spots, moles and lesions to be evaluated, some may be new or changing and the patient has concerns that these could be cancer.  The following portions of the chart were reviewed this encounter and updated as appropriate:   Tobacco  Allergies  Meds  Problems  Med Hx  Surg Hx  Fam Hx     Review of Systems:  No other skin or systemic complaints except as noted in HPI or Assessment and Plan.  Objective  Well appearing patient in no apparent distress; mood and affect are within normal limits.  All skin waist up examined.  Scalp x 2, L ear x 3, face x 6, hands x 11 (22) Erythematous thin papules/macules with gritty scale.   Scalp x 5, back x 1 (6) Erythematous stuck-on, waxy papule or plaque   Assessment & Plan  AK (actinic keratosis) (22) Scalp x 2, L ear x 3, face x 6, hands x 11  Destruction of lesion - Scalp x 2, L ear x 3, face x 6, hands x 11 Complexity: simple   Destruction method: cryotherapy   Informed consent: discussed and consent obtained   Timeout:  patient name, date of birth, surgical site, and procedure verified Lesion destroyed using liquid nitrogen: Yes   Region frozen until ice ball extended beyond lesion: Yes   Outcome: patient tolerated procedure well with no complications   Post-procedure details: wound care instructions given    Inflamed seborrheic keratosis (6) Scalp x 5, back x 1  Symptomatic, irritating, patient would like treated.   Destruction of lesion - Scalp x 5, back x 1 Complexity: simple   Destruction method: cryotherapy   Informed consent: discussed and consent obtained   Timeout:  patient name, date of birth,  surgical site, and procedure verified Lesion destroyed using liquid nitrogen: Yes   Region frozen until ice ball extended beyond lesion: Yes   Outcome: patient tolerated procedure well with no complications   Post-procedure details: wound care instructions given    Lentigines - Scattered tan macules - Due to sun exposure - Benign-appearing, observe - Recommend daily broad spectrum sunscreen SPF 30+ to sun-exposed areas, reapply every 2 hours as needed. - Call for any changes  Seborrheic Keratoses - Stuck-on, waxy, tan-brown papules and/or plaques  - Benign-appearing - Discussed benign etiology and prognosis. - Observe - Call for any changes  Melanocytic Nevi - Tan-brown and/or pink-flesh-colored symmetric macules and papules - Benign appearing on exam today - Observation - Call clinic for new or changing moles - Recommend daily use of broad spectrum spf 30+ sunscreen to sun-exposed areas.   Hemangiomas - Red papules - Discussed benign nature - Observe - Call for any changes  Actinic Damage - Chronic condition, secondary to cumulative UV/sun exposure - diffuse scaly erythematous macules with underlying dyspigmentation - Recommend daily broad spectrum sunscreen SPF 30+ to sun-exposed areas, reapply every 2 hours as needed.  - Staying in the shade or wearing long sleeves, sun glasses (UVA+UVB protection) and wide brim hats (4-inch brim around the entire circumference of the hat) are also recommended for sun protection.  - Call for new or changing lesions.  History  of Basal Cell Carcinoma of the Skin - No evidence of recurrence today - Recommend regular full body skin exams - Recommend daily broad spectrum sunscreen SPF 30+ to sun-exposed areas, reapply every 2 hours as needed.  - Call if any new or changing lesions are noted between office visits  Skin cancer screening performed today.  Return for TBSE in 6-12 months.  Luther Redo, CMA, am acting as scribe for [name], MD . Documentation: I have reviewed the above documentation for accuracy and completeness, and I agree with the above.  [name], MD

## 2022-09-25 ENCOUNTER — Ambulatory Visit (INDEPENDENT_AMBULATORY_CARE_PROVIDER_SITE_OTHER): Payer: Medicare Other | Admitting: Podiatry

## 2022-09-25 ENCOUNTER — Encounter: Payer: Self-pay | Admitting: Podiatry

## 2022-09-25 DIAGNOSIS — M778 Other enthesopathies, not elsewhere classified: Secondary | ICD-10-CM

## 2022-09-25 DIAGNOSIS — D2372 Other benign neoplasm of skin of left lower limb, including hip: Secondary | ICD-10-CM

## 2022-09-25 MED ORDER — DEXAMETHASONE SODIUM PHOSPHATE 120 MG/30ML IJ SOLN
2.0000 mg | Freq: Once | INTRAMUSCULAR | Status: AC
  Administered 2022-09-25: 2 mg via INTRA_ARTICULAR

## 2022-09-25 NOTE — Progress Notes (Signed)
Subjective:  Patient ID: Adrian Davis, male    DOB: 1940-11-29,  MRN: [id number] HPI Chief Complaint  Patient presents with   Foot Pain    Right foot - patient's wife state she has back trouble and getting injections, pain is radiating down right leg and foot, says its deformed the right foot    81 y.o. male presents with the above complaint.   ROS: Denies fever chills nausea vomiting muscle aches pains.  Past Medical History:  Diagnosis Date   Abnormal echocardiogram Jan 2012   grade 1 diastolic dysfunction, normal EF, mild LVH   Anemia    Basal cell carcinoma 04/08/2019   left nose inferior to nasal alar groove. EDC: 05/20/2019   Bell palsy 10/2010   presented with facial numbness   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    Degenerative disk disease    GERD (gastroesophageal reflux disease)    History of kidney stones    Hyperlipidemia    Hypertension    dr [name]   Kidney cysts    Lumbar stenosis    Nephrolithiasis    Normal nuclear stress test 2008   Osteoarthritis    Skin cancer    Vocal cord leukoplakia    Dr Erik Obey   Past Surgical History:  Procedure Laterality Date   COLONOSCOPY  04/2019   TAx2, hemorrhoids, rpt 23yr ([facility])   ESOPHAGOGASTRODUODENOSCOPY  04/2019   erosive gastropathy, biopsy negative for barrett's ([facility])   KNEE ARTHROSCOPY Right    LUMBAR LAMINECTOMY/DECOMPRESSION MICRODISCECTOMY N/A 11/12/2013   JOphelia Charter MD - complicated by urinary retention   MULTIPLE TOOTH EXTRACTIONS     SHOULDER SURGERY Bilateral    TONSILLECTOMY     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   Procedure: TOTAL HIP ARTHROPLASTY ANTERIOR APPROACH;  Surgeon: AGaynelle Arabian MD;  Location: [facility];  Service: Orthopedics;  Laterality: Right;  1029m   TOTAL KNEE ARTHROPLASTY Left 11/21/2021   Procedure: TOTAL KNEE ARTHROPLASTY;  Surgeon: AlGaynelle ArabianMD;  Location: [facility];  Service: Orthopedics;  Laterality: Left;    Current  Outpatient Medications:    acetaminophen (TYLENOL) 650 MG CR tablet, Take 1,300 mg by mouth every 8 (eight) hours as needed for pain., Disp: , Rfl:    amLODipine (NORVASC) 2.5 MG tablet, TAKE 2 TABLETS BY MOUTH EVERY MORNING AND 1 TABLET IN THE AFTERNOON AS DIRECTED, Disp: 270 tablet, Rfl: 3   aspirin 81 MG chewable tablet, Chew by mouth daily., Disp: , Rfl:    benazepril (LOTENSIN) 20 MG tablet, Take 1 tablet (20 mg total) by mouth 2 (two) times daily., Disp: 180 tablet, Rfl: 3   docusate sodium (COLACE) 100 MG capsule, Take 1 capsule (100 mg total) by mouth 2 (two) times daily. (Patient taking differently: Take 100 mg by mouth 2 (two) times daily as needed for moderate constipation.), Disp: 180 capsule, Rfl: 3   famotidine (PEPCID) 20 MG tablet, TAKE 1 TABLET BY MOUTH TWICE A DAY, Disp: 180 tablet, Rfl: 0   hydrochlorothiazide (HYDRODIURIL) 25 MG tablet, Take 25 mg by mouth daily., Disp: , Rfl:    ketoconazole (NIZORAL) 2 % cream, Apply 1 application topically daily as needed for irritation., Disp: , Rfl:    loratadine (CLARITIN) 10 MG tablet, Take 10 mg by mouth daily., Disp: , Rfl:    naproxen sodium (ALEVE) 220 MG tablet, Take 220 mg by mouth daily as needed., Disp: , Rfl:    pantoprazole (PROTONIX) 40 MG tablet, TAKE 1 TABLET BY  MOUTH TWICE A DAY, Disp: 180 tablet, Rfl: 2   polyethylene glycol powder (GLYCOLAX/MIRALAX) 17 GM/SCOOP powder, TAKE 17 GRAMS BY MOUTH TWICE DAILY (Patient taking differently: Take 17 g by mouth at bedtime.), Disp: 510 g, Rfl: 2   potassium chloride (KLOR-CON) 10 MEQ tablet, TAKE 1 TABLET BY MOUTH ONCE A DAY, Disp: 90 tablet, Rfl: 3   sildenafil (REVATIO) 20 MG tablet, Take 2 to 5 tablets as needed, Disp: 50 tablet, Rfl: 3   simvastatin (ZOCOR) 10 MG tablet, TAKE 1 TABLET BY MOUTH ONCE A DAY AT [facility] THE EVENING, Disp: 90 tablet, Rfl: 3   tamsulosin (FLOMAX) 0.4 MG CAPS capsule, TAKE 1 CAPSULE BY MOUTH ONCE DAILY AFTERSUPPER, Disp: 90 capsule, Rfl: 3   traMADol  (ULTRAM) 50 MG tablet, Take 1-2 tablets (50-100 mg total) by mouth every 6 (six) hours as needed for moderate pain., Disp: 40 tablet, Rfl: 0  No Known Allergies Review of Systems Objective:  There were no vitals filed for this visit.  General: Well developed, nourished, in no acute distress, alert and oriented x3   Dermatological: Skin is warm, dry and supple bilateral. Nails x 10 are well maintained; remaining integument appears unremarkable at this time. There are no open sores, no preulcerative lesions, no rash or signs of infection present.  Benign skin lesion medial aspect fifth digit left foot  Vascular: Dorsalis Pedis artery and Posterior Tibial artery pedal pulses are 2/4 bilateral with immedate capillary fill time. Pedal hair growth present. No varicosities and no lower extremity edema present bilateral.   Neruologic: Grossly intact via light touch bilateral. Vibratory intact via tuning fork bilateral. Protective threshold with Semmes Wienstein monofilament intact to all pedal sites bilateral. Patellar and Achilles deep tendon reflexes 2+ bilateral. No Babinski or clonus noted bilateral.   Musculoskeletal: No gross boney pedal deformities bilateral. No pain, crepitus, or limitation noted with foot and ankle range of motion bilateral. Muscular strength 5/5 in all groups tested bilateral.  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  Gait: Unassisted, Nonantalgic.    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     Carlon Chaloux T. [place], [place]

## 2022-10-04 DIAGNOSIS — Z23 Encounter for immunization: Secondary | ICD-10-CM | POA: Diagnosis not present

## 2022-10-05 ENCOUNTER — Encounter: Payer: Self-pay | Admitting: Dermatology

## 2022-10-13 DIAGNOSIS — Z23 Encounter for immunization: Secondary | ICD-10-CM | POA: Diagnosis not present

## 2022-10-26 ENCOUNTER — Other Ambulatory Visit: Payer: Self-pay | Admitting: Family Medicine

## 2022-10-26 DIAGNOSIS — K219 Gastro-esophageal reflux disease without esophagitis: Secondary | ICD-10-CM

## 2022-10-30 DIAGNOSIS — M5416 Radiculopathy, lumbar region: Secondary | ICD-10-CM | POA: Diagnosis not present

## 2022-10-30 DIAGNOSIS — Z6828 Body mass index (BMI) 28.0-28.9, adult: Secondary | ICD-10-CM | POA: Diagnosis not present

## 2022-10-30 DIAGNOSIS — M48062 Spinal stenosis, lumbar region with neurogenic claudication: Secondary | ICD-10-CM | POA: Diagnosis not present

## 2022-10-30 NOTE — Telephone Encounter (Signed)
Patient scheduled.

## 2022-10-30 NOTE — Telephone Encounter (Signed)
E-scribed refill.  Pt had MCR wellness on 08/22/22. Plz schedule CPE and lab visits to prevent delays in future refills.

## 2022-11-17 ENCOUNTER — Ambulatory Visit (INDEPENDENT_AMBULATORY_CARE_PROVIDER_SITE_OTHER)
Admission: RE | Admit: 2022-11-17 | Discharge: 2022-11-17 | Disposition: A | Discharge status: Home / Self Care | Payer: Medicare Other | Source: Ambulatory Visit | Attending: Primary Care | Admitting: Primary Care

## 2022-11-17 ENCOUNTER — Other Ambulatory Visit: Payer: Self-pay | Admitting: Cardiology

## 2022-11-17 ENCOUNTER — Encounter: Payer: Self-pay | Admitting: Primary Care

## 2022-11-17 ENCOUNTER — Telehealth: Payer: Self-pay | Admitting: Family Medicine

## 2022-11-17 ENCOUNTER — Ambulatory Visit (INDEPENDENT_AMBULATORY_CARE_PROVIDER_SITE_OTHER): Payer: Medicare Other | Admitting: Primary Care

## 2022-11-17 VITALS — BP 126/74 | HR 80 | Temp 98.0°F | Ht 67.5 in | Wt 182.0 lb

## 2022-11-17 DIAGNOSIS — G8929 Other chronic pain: Secondary | ICD-10-CM | POA: Diagnosis not present

## 2022-11-17 DIAGNOSIS — M109 Gout, unspecified: Secondary | ICD-10-CM | POA: Insufficient documentation

## 2022-11-17 DIAGNOSIS — M2550 Pain in unspecified joint: Secondary | ICD-10-CM | POA: Diagnosis not present

## 2022-11-17 DIAGNOSIS — M7989 Other specified soft tissue disorders: Secondary | ICD-10-CM | POA: Diagnosis not present

## 2022-11-17 DIAGNOSIS — M7731 Calcaneal spur, right foot: Secondary | ICD-10-CM | POA: Diagnosis not present

## 2022-11-17 DIAGNOSIS — M1A9XX0 Chronic gout, unspecified, without tophus (tophi): Secondary | ICD-10-CM | POA: Insufficient documentation

## 2022-11-17 LAB — SEDIMENTATION RATE: Sed Rate: 11 mm/hr (ref 0–20)

## 2022-11-17 LAB — C-REACTIVE PROTEIN: CRP: <1 mg/dL (ref 0.5–20.0)

## 2022-11-17 LAB — URIC ACID: Uric Acid, Serum: 9.4 mg/dL — ABNORMAL HIGH (ref 4.0–7.8)

## 2022-11-17 MED ORDER — PREDNISONE 20 MG PO TABS: ORAL_TABLET | ORAL | 0 refills | Status: DC

## 2022-11-17 NOTE — Telephone Encounter (Signed)
Patient wife called in to follow up on labs that was done this morning. Informed patient once results are in someone will reach out. She just want to check because the pharmacy closes at 6:00. Thank you!

## 2022-11-17 NOTE — Progress Notes (Signed)
Subjective:    Patient ID: Adrian Davis, male    DOB: January 12, 1941, 82 y.o.   MRN: [id number]  HPI  Adrian Davis is a very pleasant 82 y.o. male patient of Dr. Danise Mina with a history of osteoarthritis of multiple joints, tick bites, chronic back pain, who presents today to discuss right foot pain.  His wife joins [place] today.  His pain is located to the right foot which has been chronic for months. Over the last several days his pain has progressed and he's noticed swelling, redness, and tingling to the entire right great toe. Most of his tenderness is to the right medial great toe. He mentions a history of gout, no flare in 10 years at least. He's been taking Tylenol for his chronic back pain without improvement in foot pain.  Evaluated by podiatry in [place] December 2023 for right lower back pain with radiation down right lower extremity and right foot. He was diagnosed with capsulitis sesamoiditis and received a steroid injection.   He has taken prednisone before for chronic back pain, no issues with prednisone. He's not undergone plain films of the right foot.   He has a chronic history of osteoarthritis to multiple joints with disfigurement. He has never undergone testing for rheumatoid arthritis to his knowledge. He does experience chronic joint pain to his hands with swelling at times. He works as a Psychologist, sport and exercise and is active as much as he can during the day.   Review of Systems  Musculoskeletal:  Positive for arthralgias and joint swelling.  Skin:  Positive for color change.  Neurological:  Positive for numbness.         Past Medical History:  Diagnosis Date   Abnormal echocardiogram Jan 2012   grade 1 diastolic dysfunction, normal EF, mild LVH   Anemia    Basal cell carcinoma 04/08/2019   left nose inferior to nasal alar groove. EDC: 05/20/2019   Bell palsy 10/2010   presented with facial numbness   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    Degenerative disk  disease    GERD (gastroesophageal reflux disease)    History of kidney stones    Hyperlipidemia    Hypertension    dr [name]   Kidney cysts    Lumbar stenosis    Nephrolithiasis    Normal nuclear stress test 2008   Osteoarthritis    Skin cancer    Vocal cord leukoplakia    Dr Erik Obey    Social History   Socioeconomic History   Marital status: Married    Spouse name: Not on file   Number of children: 1   Years of education: Not on file   Highest education level: Not on file  Occupational History   Occupation: farmer  Tobacco Use   Smoking status: Former    Packs/day: 1.50    Years: 10.00    Total pack years: 15.00    Types: Cigarettes    Quit date: 05/15/1976    Years since quitting: 46.5   Smokeless tobacco: Current    Types: Chew   Tobacco comments:    chews tobacco  Vaping Use   Vaping Use: Never used  Substance and Sexual Activity   Alcohol use: Yes    Comment: rare   Drug use: No   Sexual activity: Not on file  Other Topics Concern   Not on file  Social History Narrative   Lives with wife   Occ: farm   Edu: HS  Activity: active on farm   Diet: good water, fruits/vegetables daily   Social Determinants of Health   Financial Resource Strain: Low Risk  (08/22/2022)   Overall Financial Resource Strain (CARDIA)    Difficulty of Paying Living Expenses: Not hard at all  Food Insecurity: No Food Insecurity (08/22/2022)   Hunger Vital Sign    Worried About Running Out of Food in the Last Year: Never true    Ran Out of Food in the Last Year: Never true  Transportation Needs: No Transportation Needs (08/22/2022)   PRAPARE - Hydrologist (Medical): No    Lack of Transportation (Non-Medical): No  Physical Activity: Not on file  Stress: No Stress Concern Present (08/22/2022)   [place]    Feeling of Stress : Not at all  Social Connections: [place] (08/22/2022)   Social Connection and Isolation Panel [NHANES]    Frequency of Communication with Friends and Family: More than three times a week    Frequency of Social Gatherings with Friends and Family: More than three times a week    Attends Religious Services: More than 4 times per year    Active Member of Clubs or Organizations: Yes    Attends Archivist Meetings: More than 4 times per year    Marital Status: Married  Human resources officer Violence: Not At Risk (08/22/2022)   Humiliation, Afraid, Rape, and Kick questionnaire    Fear of Current or Ex-Partner: No    Emotionally Abused: No    Physically Abused: No    Sexually Abused: No    Past Surgical History:  Procedure Laterality Date   COLONOSCOPY  04/2019   TAx2, hemorrhoids, rpt 63yr ([place])   ESOPHAGOGASTRODUODENOSCOPY  04/2019   erosive gastropathy, biopsy negative for barrett's ([place])   KNEE ARTHROSCOPY Right    LUMBAR LAMINECTOMY/DECOMPRESSION MICRODISCECTOMY N/A 11/12/2013   JOphelia Charter MD - complicated by urinary retention   MULTIPLE TOOTH EXTRACTIONS     SHOULDER SURGERY Bilateral    TONSILLECTOMY     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   Procedure: TOTAL HIP ARTHROPLASTY ANTERIOR APPROACH;  Surgeon: AGaynelle Arabian MD;  Location: [facility];  Service: Orthopedics;  Laterality: Right;  1025m   TOTAL KNEE ARTHROPLASTY Left 11/21/2021   Procedure: TOTAL KNEE ARTHROPLASTY;  Surgeon: AlGaynelle ArabianMD;  Location: [facility];  Service: Orthopedics;  Laterality: Left;    Family History  Problem Relation Age of Onset   Heart failure Father [id number] Hypertension Father    Arthritis Father    Anemia Mother        needed regular transfusions   Prostate cancer Son    Cancer Neg Hx    Diabetes Neg Hx    Colon cancer Neg Hx    Rectal cancer Neg Hx    Stomach cancer Neg Hx    Inflammatory bowel disease Neg Hx    Liver disease Neg Hx    Pancreatic cancer Neg Hx     No Known  Allergies  Current Outpatient Medications on File Prior to Visit  Medication Sig Dispense Refill   acetaminophen (TYLENOL) 650 MG CR tablet Take 1,300 mg by mouth every 8 (eight) hours as needed for pain.     amLODipine (NORVASC) 2.5 MG tablet TAKE 2 TABLETS BY MOUTH EVERY MORNING AND 1 TABLET IN THE AFTERNOON AS DIRECTED 270 tablet 3   aspirin 81 MG chewable tablet Chew by mouth daily.  benazepril (LOTENSIN) 20 MG tablet Take 1 tablet (20 mg total) by mouth 2 (two) times daily. 180 tablet 3   docusate sodium (COLACE) 100 MG capsule Take 1 capsule (100 mg total) by mouth 2 (two) times daily. (Patient taking differently: Take 100 mg by mouth 2 (two) times daily as needed for moderate constipation.) 180 capsule 3   famotidine (PEPCID) 20 MG tablet TAKE 1 TABLET BY MOUTH TWICE A DAY 180 tablet 0   gabapentin (NEURONTIN) 100 MG capsule take 1-3 capsules 3 times a day as instructed.     hydrochlorothiazide (HYDRODIURIL) 25 MG tablet Take 25 mg by mouth daily.     ketoconazole (NIZORAL) 2 % cream Apply 1 application topically daily as needed for irritation.     loratadine (CLARITIN) 10 MG tablet Take 10 mg by mouth daily.     pantoprazole (PROTONIX) 40 MG tablet TAKE 1 TABLET BY MOUTH TWICE A DAY 180 tablet 2   polyethylene glycol powder (GLYCOLAX/MIRALAX) 17 GM/SCOOP powder TAKE 17 GRAMS BY MOUTH TWICE DAILY (Patient taking differently: Take 17 g by mouth at bedtime.) 510 g 2   potassium chloride (KLOR-CON) 10 MEQ tablet TAKE 1 TABLET BY MOUTH ONCE A DAY 90 tablet 3   simvastatin (ZOCOR) 10 MG tablet TAKE 1 TABLET BY MOUTH ONCE A DAY AT [facility] THE EVENING 90 tablet 3   tamsulosin (FLOMAX) 0.4 MG CAPS capsule TAKE 1 CAPSULE BY MOUTH ONCE DAILY AFTERSUPPER 90 capsule 3   naproxen sodium (ALEVE) 220 MG tablet Take 220 mg by mouth daily as needed. (Patient not taking: Reported on 11/17/2022)     sildenafil (REVATIO) 20 MG tablet Take 2 to 5 tablets as needed (Patient not taking: Reported on 11/17/2022) 50  tablet 3   traMADol (ULTRAM) 50 MG tablet Take 1-2 tablets (50-100 mg total) by mouth every 6 (six) hours as needed for moderate pain. (Patient not taking: Reported on 11/17/2022) 40 tablet 0   No current facility-administered medications on file prior to visit.    BP 126/74   Pulse 80   Temp 98 F (36.7 C) (Temporal)   Ht 5' 7.5" (1.715 m)   Wt 182 lb (82.6 kg)   SpO2 96%   BMI 28.08 kg/m  Objective:   Physical Exam Constitutional:      General: He is not in acute distress. Cardiovascular:     Rate and Rhythm: Normal rate.  Pulmonary:     Effort: Pulmonary effort is normal.  Musculoskeletal:     Comments: Enlargement of right DIP joint of great toe which appears chronic. No erythema. Pain to right great toe DIP joint with flexion, tenderness to medial right great toe.  Mild disfigurement to bilateral hands at several PIP joints. No obvious swelling. Appears chronic.   Skin:    General: Skin is warm and dry.     Findings: No erythema.  Neurological:     Mental Status: He is alert.           Assessment & Plan:  Chronic joint pain Assessment & Plan: Unclear if this is a true gout flare vs arthritis.  Checking uric acid level today. Checking plain films of the right food.  Prescription for prednisone taper sent to pharmacy.  Also checking CCP, RF for evaluation of RA, especially given his long history of widespread joint pain. He agrees. Await results.   Orders: -     predniSONE; Take three tablets my mouth once daily in the morning for two days, then two  tablets for three days, then 1 tablet for three days.  Dispense: 15 tablet; Refill: 0 -     Uric acid -     C-reactive protein -     Sedimentation rate -     Cyclic citrul peptide antibody, IgG -     Rheumatoid factor -     DG Foot Complete Right        Pleas Koch, NP

## 2022-11-17 NOTE — Assessment & Plan Note (Signed)
Unclear if this is a true gout flare vs arthritis.  Checking uric acid level today. Checking plain films of the right food.  Prescription for prednisone taper sent to pharmacy.  Also checking CCP, RF for evaluation of RA, especially given his long history of widespread joint pain. He agrees. Await results.

## 2022-11-17 NOTE — Patient Instructions (Signed)
Complete xray(s) and labs prior to leaving today. I will notify you of your results once received.  Start prednisone tablets. Take three tablets my mouth once daily in the morning for two days, then two tablets once daily in the morning for three days, then 1 tablet once daily in the morning for three days.  It was a pleasure meeting you!

## 2022-11-20 LAB — CYCLIC CITRUL PEPTIDE ANTIBODY, IGG: Cyclic Citrullin Peptide Ab: <16 UNITS

## 2022-11-20 LAB — RHEUMATOID FACTOR: Rheumatoid fact SerPl-aCnc: <14 IU/mL (ref <14)

## 2022-11-20 NOTE — Telephone Encounter (Signed)
Per chart review patient has been called about labs and medications called in. No further action needed.

## 2022-11-20 NOTE — Telephone Encounter (Signed)
Patient wife called and would like to know if his gout os going go be treated with medication,or what will be the next step regarding his gout. She spoke to someone twice today,but still haven't heard back with a a plan.

## 2022-11-21 ENCOUNTER — Other Ambulatory Visit: Payer: Self-pay | Admitting: Primary Care

## 2022-11-21 DIAGNOSIS — M109 Gout, unspecified: Secondary | ICD-10-CM

## 2022-11-21 MED ORDER — COLCHICINE 0.6 MG PO TABS: ORAL_TABLET | ORAL | 0 refills | Status: DC

## 2022-11-21 NOTE — Telephone Encounter (Signed)
See result note for further documentation.

## 2022-12-06 ENCOUNTER — Encounter: Payer: Self-pay | Admitting: Family Medicine

## 2022-12-06 ENCOUNTER — Ambulatory Visit (INDEPENDENT_AMBULATORY_CARE_PROVIDER_SITE_OTHER): Payer: Medicare Other | Admitting: Family Medicine

## 2022-12-06 VITALS — BP 122/80 | HR 80 | Temp 97.9°F | Ht 67.5 in | Wt 179.2 lb

## 2022-12-06 DIAGNOSIS — M2041 Other hammer toe(s) (acquired), right foot: Secondary | ICD-10-CM | POA: Diagnosis not present

## 2022-12-06 DIAGNOSIS — M109 Gout, unspecified: Secondary | ICD-10-CM

## 2022-12-06 MED ORDER — PREDNISONE 20 MG PO TABS: ORAL_TABLET | ORAL | 0 refills | Status: DC

## 2022-12-06 MED ORDER — CICLOPIROX 8 % EX SOLN: Freq: Every day | CUTANEOUS | 0 refills | Status: DC

## 2022-12-06 NOTE — Progress Notes (Unsigned)
Patient ID: Adrian Davis, male    DOB: 23-Oct-1941, 82 y.o.   MRN: [id number]  This visit was conducted in person.  BP 122/80   Pulse 80   Temp 97.9 F (36.6 C) (Temporal)   Ht 5' 7.5" (1.715 m)   Wt 179 lb 4 oz (81.3 kg)   SpO2 99%   BMI 27.66 kg/m    CC: R toe pain Subjective:   HPI: Adrian Davis is a 82 y.o. male presenting on 12/06/2022 for Joint Pain (Here for f/u of gout in toe of R foot. Seen on 11/17/22. Pain resumed after finishing prednisone. Pt accompanied by wife, Aram Beecham. )   Seen 2-3 wks ago by Dr Carlis Abbott with R toe pain - treated for presumed gout with prednisone taper (60/60/40/40/40/20/20/20) as well as colchicine PRN. He continues taking colchicine BID. Labs significant for elevated urate to 9.4.   Seen 09/2022 by Dr Milinda Pointer podiatry with dx capsulitis and treated with shot of steroid without much benefit.   2 wks ago toe was more inflamed and red and warm.   Remote h/o gout - last flare was 14 yrs ago. Previously on allopurinol for about a year.   [facility] - treating with gabapentin 200/200/39m.      Relevant past medical, surgical, family and social history reviewed and updated as indicated. Interim medical history since our last visit reviewed. Allergies and medications reviewed and updated. Outpatient Medications Prior to Visit  Medication Sig Dispense Refill   acetaminophen (TYLENOL) 650 MG CR tablet Take 1,300 mg by mouth every 8 (eight) hours as needed for pain.     amLODipine (NORVASC) 2.5 MG tablet TAKE 2 TABLETS BY MOUTH EVERY MORNING AND 1 TABLET IN THE AFTERNOON AS DIRECTED 270 tablet 3   aspirin 81 MG chewable tablet Chew by mouth daily.     benazepril (LOTENSIN) 20 MG tablet Take 1 tablet (20 mg total) by mouth 2 (two) times daily. 180 tablet 3   docusate sodium (COLACE) 100 MG capsule Take 1 capsule (100 mg total) by mouth 2 (two) times daily. (Patient taking differently: Take 100 mg by mouth 2 (two) times daily as  needed for moderate constipation.) 180 capsule 3   famotidine (PEPCID) 20 MG tablet TAKE 1 TABLET BY MOUTH TWICE A DAY 180 tablet 0   gabapentin (NEURONTIN) 100 MG capsule take 1-3 capsules 3 times a day as instructed.     hydrochlorothiazide (HYDRODIURIL) 25 MG tablet Take 25 mg by mouth daily.     ketoconazole (NIZORAL) 2 % cream Apply 1 application topically daily as needed for irritation.     loratadine (CLARITIN) 10 MG tablet Take 10 mg by mouth daily.     naproxen sodium (ALEVE) 220 MG tablet Take 220 mg by mouth daily as needed.     pantoprazole (PROTONIX) 40 MG tablet TAKE 1 TABLET BY MOUTH TWICE A DAY 180 tablet 2   polyethylene glycol powder (GLYCOLAX/MIRALAX) 17 GM/SCOOP powder TAKE 17 GRAMS BY MOUTH TWICE DAILY (Patient taking differently: Take 17 g by mouth at bedtime.) 510 g 2   potassium chloride (KLOR-CON) 10 MEQ tablet TAKE 1 TABLET BY MOUTH ONCE A DAY 90 tablet 3   simvastatin (ZOCOR) 10 MG tablet TAKE 1 TABLET BY MOUTH ONCE A DAY AT SIXIN THE EVENING 90 tablet 3   tamsulosin (FLOMAX) 0.4 MG CAPS capsule TAKE 1 CAPSULE BY MOUTH ONCE DAILY AFTERSUPPER 90 capsule 3   colchicine 0.6 MG tablet Take  2 tablets by mouth at gout onset, then take 1 tablet 2 hours later, then take 1 tablet twice daily until flare resolves. 60 tablet 0   colchicine 0.6 MG tablet Take 2 tablets by mouth at gout onset, then take 1 tablet daily until gout flare resolved 60 tablet 0   predniSONE (DELTASONE) 20 MG tablet Take three tablets my mouth once daily in the morning for two days, then two tablets for three days, then 1 tablet for three days. 15 tablet 0   sildenafil (REVATIO) 20 MG tablet Take 2 to 5 tablets as needed (Patient not taking: Reported on 11/17/2022) 50 tablet 3   traMADol (ULTRAM) 50 MG tablet Take 1-2 tablets (50-100 mg total) by mouth every 6 (six) hours as needed for moderate pain. (Patient not taking: Reported on 11/17/2022) 40 tablet 0   No facility-administered medications prior to visit.      Per HPI unless specifically indicated in ROS section below Review of Systems  Objective:  BP 122/80   Pulse 80   Temp 97.9 F (36.6 C) (Temporal)   Ht 5' 7.5" (1.715 m)   Wt 179 lb 4 oz (81.3 kg)   SpO2 99%   BMI 27.66 kg/m   Wt Readings from Last 3 Encounters:  12/06/22 179 lb 4 oz (81.3 kg)  11/17/22 182 lb (82.6 kg)  08/30/22 176 lb 9.6 oz (80.1 kg)      Physical Exam    Results for orders placed or performed in visit on 11/17/22  Uric acid  Result Value Ref Range   Uric Acid, Serum 9.4 (H) 4.0 - 7.8 mg/dL  C-reactive protein  Result Value Ref Range   CRP <1.0 0.5 - 20.0 mg/dL  Sedimentation rate  Result Value Ref Range   Sed Rate 11 0 - 20 mm/hr  Cyclic citrul peptide antibody, IgG  Result Value Ref Range   Cyclic Citrullin Peptide Ab <16 UNITS  Rheumatoid factor  Result Value Ref Range   Rhuematoid fact SerPl-aCnc <14 <14 IU/mL   Lab Results  Component Value Date   CREATININE 0.89 08/24/2022   BUN 15 08/24/2022   NA 142 08/24/2022   K 4.5 08/24/2022   CL 105 08/24/2022   CO2 24 08/24/2022   GFR = 87  DG Foot Complete Right CLINICAL DATA:  No known injury, right foot pain and swelling.  EXAM: RIGHT FOOT COMPLETE - 3+ VIEW  COMPARISON:  None Available.  FINDINGS: No acute fracture or dislocation. No aggressive osseous lesion. Normal alignment. Small plantar calcaneal spur.  Soft tissue are unremarkable. No radiopaque foreign body or soft tissue emphysema.  IMPRESSION: 1. No acute osseous injury of the right foot.  Electronically Signed   By: Kathreen Devoid M.D.   On: 11/18/2022 08:49   Assessment & Plan:   Problem List Items Addressed This Visit   None Visit Diagnoses     Acute gout involving toe of right foot, unspecified cause       Relevant Medications   predniSONE (DELTASONE) 20 MG tablet   colchicine 0.6 MG tablet        Meds ordered this encounter  Medications   predniSONE (DELTASONE) 20 MG tablet    Sig: Take two  tablets daily for 7 days followed by one tablet daily for 7 days    Dispense:  21 tablet    Refill:  0   ciclopirox (PENLAC) 8 % solution    Sig: Apply topically at bedtime. Apply over nail and surrounding  skin. Apply daily over previous coat. After seven (7) days, may remove with alcohol and continue cycle.    Dispense:  6.6 mL    Refill:  0    No orders of the defined types were placed in this encounter.   Patient Instructions  I think this is persistent gout Restart longer prednisone taper sent to pharmacy.  Drop colchicine to one tablet daily until flare has resolved Look at gout diet handout provided today.  Take vitamin C 539m daily or increase foods that contain vitamin C.   Follow up plan: Return if symptoms worsen or fail to improve.  JRia Bush MD

## 2022-12-06 NOTE — Patient Instructions (Addendum)
I think this is persistent gout Restart longer prednisone taper sent to pharmacy.  Drop colchicine to one tablet daily until flare has resolved Look at gout diet handout provided today.  Take vitamin C 559m daily or increase foods that contain vitamin C.

## 2022-12-07 ENCOUNTER — Telehealth: Payer: Self-pay | Admitting: Family Medicine

## 2022-12-07 DIAGNOSIS — M2041 Other hammer toe(s) (acquired), right foot: Secondary | ICD-10-CM | POA: Insufficient documentation

## 2022-12-07 NOTE — Assessment & Plan Note (Signed)
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

## 2022-12-07 NOTE — Assessment & Plan Note (Signed)
Story/exam, urate levels most consistent with acute gout flare of R great toe.  Will place on extended prednisone taper and drop colchicine to 0.45m once daily until flare has resolved.  Reviewed dietary choices to prevent recurrent flare, low purine diet handout provided.  Discussed possibly starting allopurinol once flare has resolved.

## 2022-12-07 NOTE — Telephone Encounter (Signed)
I'd like to change how he takes prednisone 27m tablets prescribed yesterday - recommend he take 2 tablets (422m daily in am until gout pain has resolved. Once gout pain is gone, drop to 1 tablet (2023mdaily for 4 days, then drop to 1/2 tablet (69m27maily for 4 days then stop. Let me know if has any trouble cutting 20mg22mlet in half and I can send in 69mg 75mets.   Also, once gout flare is better, would he be interested in trying low dose allopurinol daily to help lower uric acid levels and prevent future gout flares?

## 2022-12-07 NOTE — Telephone Encounter (Signed)
Spoke with pt's wife, Aram Beecham (on dpr) relaying Dr. Synthia Innocent message. She verbalizes understanding.

## 2022-12-14 DIAGNOSIS — M5416 Radiculopathy, lumbar region: Secondary | ICD-10-CM | POA: Diagnosis not present

## 2022-12-21 NOTE — Telephone Encounter (Addendum)
Patient wife called back and said gout has resolved and asked for Allopurinol to be sent into [place].  [phone] is call back if needed

## 2022-12-22 MED ORDER — ALLOPURINOL 100 MG PO TABS: 100.0000 mg | ORAL_TABLET | Freq: Every day | ORAL | 3 refills | Status: DC

## 2022-12-22 NOTE — Telephone Encounter (Signed)
Allopurinol '100mg'$  sent to pharmacy.

## 2022-12-22 NOTE — Addendum Note (Signed)
Addended by: [name] on: 12/22/2022 07:25 AM   Modules accepted: Orders

## 2022-12-22 NOTE — Telephone Encounter (Signed)
Spoke with pt notifying him rx was sent in. Pt expresses his thanks.

## 2023-01-08 DIAGNOSIS — M5416 Radiculopathy, lumbar region: Secondary | ICD-10-CM | POA: Diagnosis not present

## 2023-01-10 ENCOUNTER — Other Ambulatory Visit: Payer: Self-pay | Admitting: Physician Assistant

## 2023-01-10 DIAGNOSIS — M5416 Radiculopathy, lumbar region: Secondary | ICD-10-CM

## 2023-01-22 ENCOUNTER — Other Ambulatory Visit: Payer: Self-pay | Admitting: Family Medicine

## 2023-01-22 DIAGNOSIS — E538 Deficiency of other specified B group vitamins: Secondary | ICD-10-CM

## 2023-01-22 DIAGNOSIS — E7800 Pure hypercholesterolemia, unspecified: Secondary | ICD-10-CM

## 2023-01-22 DIAGNOSIS — M109 Gout, unspecified: Secondary | ICD-10-CM

## 2023-01-23 ENCOUNTER — Telehealth: Payer: Self-pay | Admitting: Family Medicine

## 2023-01-23 NOTE — Telephone Encounter (Signed)
Speaking to Mrs. Utz  Diarrhea couple of spells daily, not where he has to stay by the bathroom Scheduled to see [facility] tomorrow afternoon for poss gout, again, and foot pain up to shin, along with back problems Started over Easter weekend  Please call Mrs. Daigneault's cell phone with any updates on what to possibly do about the diarrhea Gallaher,Trudy (Spouse)  [phone] [facility])

## 2023-01-23 NOTE — Telephone Encounter (Signed)
Ensure no fever, blood in stool or abd pain. If none of above, ok to try OTC imodium daily as needed.  Let us know if not improving with this.

## 2023-01-24 ENCOUNTER — Ambulatory Visit (INDEPENDENT_AMBULATORY_CARE_PROVIDER_SITE_OTHER)
Admission: RE | Admit: 2023-01-24 | Discharge: 2023-01-24 | Disposition: A | Discharge status: Home / Self Care | Payer: Medicare Other | Source: Ambulatory Visit | Attending: Nurse Practitioner | Admitting: Nurse Practitioner

## 2023-01-24 ENCOUNTER — Encounter: Payer: Self-pay | Admitting: Nurse Practitioner

## 2023-01-24 ENCOUNTER — Other Ambulatory Visit (INDEPENDENT_AMBULATORY_CARE_PROVIDER_SITE_OTHER): Payer: Medicare Other

## 2023-01-24 ENCOUNTER — Ambulatory Visit (INDEPENDENT_AMBULATORY_CARE_PROVIDER_SITE_OTHER): Payer: Medicare Other | Admitting: Nurse Practitioner

## 2023-01-24 VITALS — BP 120/66 | HR 74 | Temp 99.0°F | Resp 16 | Ht 67.5 in | Wt 175.1 lb

## 2023-01-24 DIAGNOSIS — E538 Deficiency of other specified B group vitamins: Secondary | ICD-10-CM | POA: Diagnosis not present

## 2023-01-24 DIAGNOSIS — E7800 Pure hypercholesterolemia, unspecified: Secondary | ICD-10-CM | POA: Diagnosis not present

## 2023-01-24 DIAGNOSIS — M79604 Pain in right leg: Secondary | ICD-10-CM | POA: Diagnosis not present

## 2023-01-24 DIAGNOSIS — R197 Diarrhea, unspecified: Secondary | ICD-10-CM | POA: Insufficient documentation

## 2023-01-24 DIAGNOSIS — M109 Gout, unspecified: Secondary | ICD-10-CM

## 2023-01-24 DIAGNOSIS — M79674 Pain in right toe(s): Secondary | ICD-10-CM | POA: Diagnosis not present

## 2023-01-24 DIAGNOSIS — M79605 Pain in left leg: Secondary | ICD-10-CM | POA: Insufficient documentation

## 2023-01-24 LAB — COMPREHENSIVE METABOLIC PANEL
ALT: 11 U/L (ref 0–53)
AST: 10 U/L (ref 0–37)
Albumin: 4 g/dL (ref 3.5–5.2)
Alkaline Phosphatase: 50 U/L (ref 39–117)
BUN: 18 mg/dL (ref 6–23)
CO2: 26 mEq/L (ref 19–32)
Calcium: 9.4 mg/dL (ref 8.4–10.5)
Chloride: 106 mEq/L (ref 96–112)
Creatinine, Ser: 0.89 mg/dL (ref 0.40–1.50)
GFR: 80.85 mL/min (ref >60.00)
Glucose, Bld: 79 mg/dL (ref 70–99)
Potassium: 3.6 mEq/L (ref 3.5–5.1)
Sodium: 139 mEq/L (ref 135–145)
Total Bilirubin: 0.3 mg/dL (ref 0.2–1.2)
Total Protein: 5.9 g/dL — ABNORMAL LOW (ref 6.0–8.3)

## 2023-01-24 LAB — LIPID PANEL
Cholesterol: 132 mg/dL (ref 0–200)
HDL: 37.3 mg/dL — ABNORMAL LOW (ref >39.00)
LDL Cholesterol: 59 mg/dL (ref 0–99)
NonHDL: 94.8
Total CHOL/HDL Ratio: 4
Triglycerides: 180 mg/dL — ABNORMAL HIGH (ref 0.0–149.0)
VLDL: 36 mg/dL (ref 0.0–40.0)

## 2023-01-24 LAB — URIC ACID: Uric Acid, Serum: 6.1 mg/dL (ref 4.0–7.8)

## 2023-01-24 LAB — VITAMIN B12: Vitamin B-12: 1190 pg/mL — ABNORMAL HIGH (ref 211–911)

## 2023-01-24 NOTE — Telephone Encounter (Signed)
Pt is here for 3:00 OV with Matt.

## 2023-01-24 NOTE — Progress Notes (Signed)
Acute Office Visit  Subjective:     Patient ID: Adrian Davis, male    DOB: 1941/04/09, 82 y.o.   MRN: [id number]  Chief Complaint  Patient presents with   Diarrhea    Comes in spells     Patient is in today for diarrhea with a history of htn, GERD, chronic constipation, HLD  Last colonosocpy was done 05/15/2019 with Justice Britain, MD with recall in 7 years.   Upset stomach: states that it started on Friday. States that he had it Friday, saturdaym Sunday and then Monday. Since then it has stopped. States that he would have the one episose. Gas indgestion and belly pain. States that he is taking the protonix and pepcid once a day.  Swollen foot: states that he has been approx 4 weeks colchiince. States that he started the allopurinol  for the past 3-4 weeks. States that the foot is swollen. It has been swelling every afternoon. States that it has been over a month. States that the pain ahas been over a month also. States that the allopurnol has helped. States that the prednisone has helped the most.   Review of Systems  Constitutional:  Negative for chills and fever.  Respiratory:  Negative for shortness of breath.   Cardiovascular:  Negative for chest pain.  Gastrointestinal:  Positive for abdominal pain and diarrhea. Negative for blood in stool, constipation, nausea and vomiting.  Neurological:  Positive for weakness. Negative for dizziness and headaches.  Psychiatric/Behavioral:  Negative for hallucinations and suicidal ideas.         Objective:    BP 120/66   Pulse 74   Temp 99 F (37.2 C)   Resp 16   Ht 5' 7.5" (1.715 m)   Wt 175 lb 2 oz (79.4 kg)   SpO2 96%   BMI 27.02 kg/m  BP Readings from Last 3 Encounters:  01/24/23 120/66  12/06/22 122/80  11/17/22 126/74   Wt Readings from Last 3 Encounters:  01/24/23 175 lb 2 oz (79.4 kg)  12/06/22 179 lb 4 oz (81.3 kg)  11/17/22 182 lb (82.6 kg)      Physical Exam Vitals and nursing note reviewed.   Constitutional:      Appearance: Normal appearance.  Cardiovascular:     Rate and Rhythm: Normal rate and regular rhythm.     Heart sounds: Normal heart sounds.  Pulmonary:     Effort: Pulmonary effort is normal.     Breath sounds: Normal breath sounds.  Abdominal:     General: Bowel sounds are normal. There is no distension.     Palpations: There is no mass.     Tenderness: There is no abdominal tenderness.     Hernia: No hernia is present.  Musculoskeletal:       Legs:  Neurological:     Mental Status: He is alert.     Results for orders placed or performed in visit on 01/24/23  Vitamin B12  Result Value Ref Range   Vitamin B-12 1,190 (H) 211 - 911 pg/mL  Uric acid  Result Value Ref Range   Uric Acid, Serum 6.1 4.0 - 7.8 mg/dL  Comprehensive metabolic panel  Result Value Ref Range   Sodium 139 135 - 145 mEq/L   Potassium 3.6 3.5 - 5.1 mEq/L   Chloride 106 96 - 112 mEq/L   CO2 26 19 - 32 mEq/L   Glucose, Bld 79 70 - 99 mg/dL   BUN 18 6 - 23 mg/dL  Creatinine, Ser 0.89 0.40 - 1.50 mg/dL   Total Bilirubin 0.3 0.2 - 1.2 mg/dL   Alkaline Phosphatase 50 39 - 117 U/L   AST 10 0 - 37 U/L   ALT 11 0 - 53 U/L   Total Protein 5.9 (L) 6.0 - 8.3 g/dL   Albumin 4.0 3.5 - 5.2 g/dL   GFR 80.85 >60.00 mL/min   Calcium 9.4 8.4 - 10.5 mg/dL  Lipid panel  Result Value Ref Range   Cholesterol 132 0 - 200 mg/dL   Triglycerides 180.0 (H) 0.0 - 149.0 mg/dL   HDL 37.30 (L) >39.00 mg/dL   VLDL 36.0 0.0 - 40.0 mg/dL   LDL Cholesterol 59 0 - 99 mg/dL   Total CHOL/HDL Ratio 4    NonHDL 94.80         Assessment & Plan:   Problem List Items Addressed This Visit       Digestive   Diarrhea of presumed infectious origin    Diarrhea for approximately 3 days and self resolved.  There is just had lab work done today and electrolytes within normal limits.        Other   Pain of toe of right foot    Having pain in the right toe patient started on tramadol meloxicam and gout  prophylactic medication.  Continue those he had stopped the meloxicam because his stomach was upset encouraged him to start back      Pain of right lower extremity - Primary    Pain to the anterior medial right shin.  No injury no abnormal physical findings.  Will perform x-ray at the request of patient's spouse pending x-ray result      Relevant Orders   DG Tibia/Fibula Right    No orders of the defined types were placed in this encounter.   Return if symptoms worsen or fail to improve.  Romilda Garret, NP

## 2023-01-24 NOTE — Assessment & Plan Note (Signed)
Diarrhea for approximately 3 days and self resolved.  There is just had lab work done today and electrolytes within normal limits.

## 2023-01-24 NOTE — Assessment & Plan Note (Signed)
Having pain in the right toe patient started on tramadol meloxicam and gout prophylactic medication.  Continue those he had stopped the meloxicam because his stomach was upset encouraged him to start back

## 2023-01-24 NOTE — Patient Instructions (Signed)
Nice to see you today Go back on the colace/stool softener  Take the stomach pill as prescribed I will be in touch with the xray once I have it

## 2023-01-24 NOTE — Assessment & Plan Note (Signed)
Pain to the anterior medial right shin.  No injury no abnormal physical findings.  Will perform x-ray at the request of patient's spouse pending x-ray result

## 2023-01-24 NOTE — Telephone Encounter (Signed)
Lvm asking pt to call back.  Need to relay Dr. G's message.  

## 2023-01-30 ENCOUNTER — Other Ambulatory Visit: Payer: Self-pay | Admitting: Cardiology

## 2023-01-31 ENCOUNTER — Ambulatory Visit (INDEPENDENT_AMBULATORY_CARE_PROVIDER_SITE_OTHER): Payer: Medicare Other | Admitting: Family Medicine

## 2023-01-31 ENCOUNTER — Encounter: Payer: Self-pay | Admitting: Family Medicine

## 2023-01-31 VITALS — BP 126/64 | HR 81 | Temp 97.3°F | Ht 66.75 in | Wt 175.2 lb

## 2023-01-31 DIAGNOSIS — Z720 Tobacco use: Secondary | ICD-10-CM

## 2023-01-31 DIAGNOSIS — K5909 Other constipation: Secondary | ICD-10-CM

## 2023-01-31 DIAGNOSIS — M25569 Pain in unspecified knee: Secondary | ICD-10-CM

## 2023-01-31 DIAGNOSIS — N401 Enlarged prostate with lower urinary tract symptoms: Secondary | ICD-10-CM | POA: Diagnosis not present

## 2023-01-31 DIAGNOSIS — M109 Gout, unspecified: Secondary | ICD-10-CM | POA: Diagnosis not present

## 2023-01-31 DIAGNOSIS — K219 Gastro-esophageal reflux disease without esophagitis: Secondary | ICD-10-CM

## 2023-01-31 DIAGNOSIS — R202 Paresthesia of skin: Secondary | ICD-10-CM | POA: Diagnosis not present

## 2023-01-31 DIAGNOSIS — M25519 Pain in unspecified shoulder: Secondary | ICD-10-CM

## 2023-01-31 DIAGNOSIS — E7800 Pure hypercholesterolemia, unspecified: Secondary | ICD-10-CM | POA: Diagnosis not present

## 2023-01-31 DIAGNOSIS — I10 Essential (primary) hypertension: Secondary | ICD-10-CM | POA: Diagnosis not present

## 2023-01-31 DIAGNOSIS — M1A9XX0 Chronic gout, unspecified, without tophus (tophi): Secondary | ICD-10-CM

## 2023-01-31 DIAGNOSIS — R52 Pain, unspecified: Secondary | ICD-10-CM

## 2023-01-31 DIAGNOSIS — E538 Deficiency of other specified B group vitamins: Secondary | ICD-10-CM | POA: Diagnosis not present

## 2023-01-31 DIAGNOSIS — M79604 Pain in right leg: Secondary | ICD-10-CM | POA: Diagnosis not present

## 2023-01-31 DIAGNOSIS — Z7189 Other specified counseling: Secondary | ICD-10-CM

## 2023-01-31 DIAGNOSIS — R3915 Urgency of urination: Secondary | ICD-10-CM

## 2023-01-31 DIAGNOSIS — M25559 Pain in unspecified hip: Secondary | ICD-10-CM

## 2023-01-31 DIAGNOSIS — R197 Diarrhea, unspecified: Secondary | ICD-10-CM

## 2023-01-31 LAB — SEDIMENTATION RATE: Sed Rate: 23 mm/hr — ABNORMAL HIGH (ref 0–20)

## 2023-01-31 LAB — C-REACTIVE PROTEIN: CRP: 2.5 mg/dL (ref 0.5–20.0)

## 2023-01-31 MED ORDER — FAMOTIDINE 20 MG PO TABS: 20.0000 mg | ORAL_TABLET | Freq: Two times a day (BID) | ORAL | 4 refills | Status: DC

## 2023-01-31 MED ORDER — COLCHICINE 0.6 MG PO TABS
ORAL_TABLET | ORAL | 0 refills | Status: AC
Start: 2023-01-31 — End: ?

## 2023-01-31 MED ORDER — VITAMIN B-12 1000 MCG PO TABS: 1000.0000 ug | ORAL_TABLET | ORAL | Status: AC

## 2023-01-31 MED ORDER — PREDNISONE 20 MG PO TABS
ORAL_TABLET | ORAL | 0 refills | Status: DC
Start: 2023-01-31 — End: 2023-07-04

## 2023-01-31 NOTE — Patient Instructions (Addendum)
Labs today  Allopurinol may be contributing to loose stools - hold for 2 weeks and monitor stools. If better, may stay off. If not better, restart allopurinol. Prednisone prescription printed out to fill if you get gout flare.  Otherwise, continue allopurinol 100mg  daily for prevention. When you have a gout flare, take colchicine and meloxicam.  Check with [facility] on immunization records (ie shingrix shots).  Bring [place] copy of your living will.  Drop vitamin B12 to Monday Wednesday Friday  Return in 6 months for follow up visit.

## 2023-01-31 NOTE — Assessment & Plan Note (Signed)
Asked to bring us a copy 

## 2023-01-31 NOTE — Progress Notes (Unsigned)
Ph: [phone]       Fax: [phone]   Patient ID: Adrian Davis, male    DOB: 05-Mar-1941, 82 y.o.   MRN: [id number]  This visit was conducted in person.  BP 126/64   Pulse 81   Temp (!) 97.3 F (36.3 C) (Temporal)   Ht 5' 6.75" (1.695 m)   Wt 175 lb 4 oz (79.5 kg)   SpO2 97%   BMI 27.65 kg/m    CC: CPE Subjective:   HPI: Adrian Davis is a 82 y.o. male presenting on 01/31/2023 for Annual Exam (MCR prt 2 [AWV- 08/22/22]. Pt accompanied by wife, Damian Leavell. )   Saw health advisor 07/2022 for medicare wellness visit. Note reviewed.   Last medicare wellness visit prior was 12/2016  Davis results found.  Flowsheet Row Office Visit from 12/06/2022 in [facility] [place]  PHQ-2 Total Score 4          01/24/2023    3:05 PM 12/06/2022    2:07 PM 08/22/2022    8:45 AM 09/09/2018    4:50 PM 01/03/2017   10:36 AM  Fall Risk   Falls in the past year? 0 0 0 0 Davis  Comment    Emmi Telephone Survey: data to providers prior to load   Number falls in past yr: 0  0    Injury with Fall? 0  0    Risk for fall due to : Davis Fall Risks  Davis Fall Risks    Follow up Falls evaluation completed  Falls prevention discussed     Saw Audria Nine NP last week with recurrent RLE pain 2.5 wks ago ([place]) - while at [employer] - he didn't have colchicine with him. Has been off colchicine for 6-8 weeks. He continues allopurinol 100mg  daily. Currently having bilateral lateral hip pain with pain down both legs. Davis redness or swelling of joint. Meloxicam stopped due to GI upset.   Ongoing intermittent loose stools. Davis sick contacts at home. Davis new foods. Uses well water.   GERD - takes protonix 40mg  in am and pepcid 20mg  at night.   Notes diffuse body aches over the past 10 days - shoulders, hips, back, knees. Davis stiffness noted.  Lab Results  Component Value Date   ESRSEDRATE 11 11/17/2022    Preventative: Colonoscopy 04/2019 - rpt 7 yrs ([facility]) Prostate cancer screening - has  seen urology Dr Annabell Howells. Continues tamsulosin through Dr [place]. Previously on Myrbetriq.  Lung cancer screening - not eligible - quit 1977 Flu shot - 08/2016 COVID - Moderna x4 Tdap 2012 Pneumovax 2012, prevnar-13 2018 Shingrix - thinks completed these at pharmacy Advanced directive discussion - has set up living will - [name] is wife. Will bring me copy.  Smoking - 15 PY hx, quit 1977. Continues chewing tobacco  Alcohol - none Seat belt use discussed Sunscreen use discussed. 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 Interim medical history since our last visit reviewed. Allergies and medications reviewed and updated. Outpatient Medications Prior to Visit  Medication Sig Dispense Refill   acetaminophen (TYLENOL) 650 MG CR tablet Take  1,300 mg by mouth every 8 (eight) hours as needed for pain.     allopurinol (ZYLOPRIM) 100 MG tablet Take 1 tablet (100 mg total) by mouth daily. 90 tablet 3   amLODipine (NORVASC) 2.5 MG tablet TAKE 2 TABLETS BY MOUTH EVERY MORNING AND 1 TABLET IN THE AFTERNOON AS DIRECTED 270 tablet 3   aspirin 81 MG chewable tablet Chew by mouth daily.     benazepril (LOTENSIN) 20 MG tablet Take 1 tablet (20 mg total) by mouth 2 (two) times daily. 180 tablet 3   ciclopirox (PENLAC) 8 % solution Apply topically at bedtime. Apply over nail and surrounding skin. Apply daily over previous coat. After seven (7) days, may remove with alcohol and continue cycle. 6.6 mL 0   docusate sodium (COLACE) 100 MG capsule Take 1 capsule (100 mg total) by mouth 2 (two) times daily. (Patient taking differently: Take 100 mg by mouth 2 (two) times daily as  needed for moderate constipation.) 180 capsule 3   hydrochlorothiazide (HYDRODIURIL) 25 MG tablet Take 25 mg by mouth daily.     ketoconazole (NIZORAL) 2 % cream Apply 1 application topically daily as needed for irritation.     loratadine (CLARITIN) 10 MG tablet Take 10 mg by mouth daily.     pantoprazole (PROTONIX) 40 MG tablet TAKE 1 TABLET BY MOUTH TWICE A DAY 180 tablet 2   polyethylene glycol powder (GLYCOLAX/MIRALAX) 17 GM/SCOOP powder TAKE 17 GRAMS BY MOUTH TWICE DAILY (Patient taking differently: Take 17 g by mouth at bedtime. Takes as needed) 510 g 2   potassium chloride (KLOR-CON) 10 MEQ tablet TAKE 1 TABLET BY MOUTH ONCE A DAY 90 tablet 3   simvastatin (ZOCOR) 10 MG tablet TAKE 1 TABLET BY MOUTH ONCE A DAY AT SIXIN THE EVENING 90 tablet 3   tamsulosin (FLOMAX) 0.4 MG CAPS capsule TAKE ONE CAPSULE BY MOUTH ONCE DAILY AFTER SUPPER 90 capsule 3   traMADol (ULTRAM) 50 MG tablet Take 50 mg by mouth every 6 (six) hours as needed.     colchicine 0.6 MG tablet Take 2 tablets by mouth at gout onset, then take 1 tablet daily until gout flare resolved 60 tablet 0   cyanocobalamin (VITAMIN B12) 1000 MCG tablet Take 1,000 mcg by mouth daily.     famotidine (PEPCID) 20 MG tablet TAKE 1 TABLET BY MOUTH TWICE A DAY 180 tablet 0   gabapentin (NEURONTIN) 100 MG capsule take 1-3 capsules 3 times a day as instructed. (Patient not taking: Reported on 01/24/2023)     meloxicam (MOBIC) 7.5 MG tablet Take 7.5 mg by mouth daily. (Patient not taking: Reported on 01/24/2023)     naproxen sodium (ALEVE) 220 MG tablet Take 220 mg by mouth daily as needed.     predniSONE (DELTASONE) 20 MG tablet Take two tablets daily for 7 days followed by one tablet daily for 7 days (Patient not taking: Reported on 01/24/2023) 21 tablet 0   Davis facility-administered medications prior to visit.     Per HPI unless specifically indicated in ROS section below Review of Systems  Objective:  BP 126/64   Pulse 81   Temp (!) 97.3 F  (36.3 C) (Temporal)   Ht 5' 6.75" (1.695 m)   Wt 175 lb 4 oz (79.5 kg)   SpO2 97%   BMI 27.65 kg/m   Wt Readings from Last 3 Encounters:  01/31/23 175 lb 4 oz (79.5 kg)  01/24/23 175 lb 2 oz (79.4 kg)  12/06/22 179 lb 4  oz (81.3 kg)      Physical Exam Vitals and nursing note reviewed.  Constitutional:      General: He is not in acute distress.    Appearance: Normal appearance. He is well-developed. He is not ill-appearing.  HENT:     Head: Normocephalic and atraumatic.     Right Ear: Hearing, tympanic membrane, ear canal and external ear normal.     Left Ear: Hearing, tympanic membrane, ear canal and external ear normal.     Mouth/Throat:     Comments: Wearing mask Eyes:     General: Davis scleral icterus.    Extraocular Movements: Extraocular movements intact.     Conjunctiva/sclera: Conjunctivae normal.     Pupils: Pupils are equal, round, and reactive to light.  Neck:     Thyroid: Davis thyroid mass or thyromegaly.  Cardiovascular:     Rate and Rhythm: Normal rate and regular rhythm.     Pulses: Normal pulses.          Radial pulses are 2+ on the right side and 2+ on the left side.     Heart sounds: Normal heart sounds. Davis murmur heard. Pulmonary:     Effort: Pulmonary effort is normal. Davis respiratory distress.     Breath sounds: Normal breath sounds. Davis wheezing, rhonchi or rales.  Abdominal:     General: Bowel sounds are normal. There is Davis distension.     Palpations: Abdomen is soft. There is Davis mass.     Tenderness: There is Davis abdominal tenderness. There is Davis guarding or rebound.     Hernia: Davis hernia is present.  Musculoskeletal:        General: Normal range of motion.     Cervical back: Normal range of motion and neck supple.     Right lower leg: Davis edema.     Left lower leg: Davis edema.     Comments:  Neg SLR bilaterally. Davis pain with int/ext rotation at hip. Tender to R greater trochanteric bursa on palpation  Lymphadenopathy:     Cervical: Davis cervical  adenopathy.  Skin:    General: Skin is warm and dry.     Findings: Davis rash.  Neurological:     Mental Status: He is alert and oriented to person, place, and time.     Comments: Decreased sensation to right anterior shin Diminished strength noted to RLE dorsiflexion (chronic)  Psychiatric:        Mood and Affect: Mood normal.        Behavior: Behavior normal.        Thought Content: Thought content normal.        Judgment: Judgment normal.       Results for orders placed or performed in visit on 01/24/23  Vitamin B12  Result Value Ref Range   Vitamin B-12 1,190 (H) 211 - 911 pg/mL  Uric acid  Result Value Ref Range   Uric Acid, Serum 6.1 4.0 - 7.8 mg/dL  Comprehensive metabolic panel  Result Value Ref Range   Sodium 139 135 - 145 mEq/L   Potassium 3.6 3.5 - 5.1 mEq/L   Chloride 106 96 - 112 mEq/L   CO2 26 19 - 32 mEq/L   Glucose, Bld 79 70 - 99 mg/dL   BUN 18 6 - 23 mg/dL   Creatinine, Ser 8.78 0.40 - 1.50 mg/dL   Total Bilirubin 0.3 0.2 - 1.2 mg/dL   Alkaline Phosphatase 50 39 - 117 U/L   AST 10 0 -  37 U/L   ALT 11 0 - 53 U/L   Total Protein 5.9 (L) 6.0 - 8.3 g/dL   Albumin 4.0 3.5 - 5.2 g/dL   GFR 16.1080.85 >96.04>60.00 mL/min   Calcium 9.4 8.4 - 10.5 mg/dL  Lipid panel  Result Value Ref Range   Cholesterol 132 0 - 200 mg/dL   Triglycerides 540.9180.0 (H) 0.0 - 149.0 mg/dL   HDL 81.1937.30 (L) >14.78>39.00 mg/dL   VLDL 29.536.0 0.0 - 62.140.0 mg/dL   LDL Cholesterol 59 0 - 99 mg/dL   Total CHOL/HDL Ratio 4    NonHDL 94.80     Assessment & Plan:   Problem List Items Addressed This Visit     Advanced care planning/counseling discussion - Primary (Chronic)    Asked to bring us a copy.       GERD (gastroesophageal reflux disease)   Relevant Medications   famotidine (PEPCID) 20 MG tablet   Acute gout   Relevant Medications   colchicine 0.6 MG tablet   Other Relevant Orders   Vitamin B1   Sedimentation rate   C-reactive protein     Meds ordered this encounter  Medications    colchicine 0.6 MG tablet    Sig: Take 2 tablets by mouth at gout onset, then take 1 tablet daily until gout flare resolved    Dispense:  60 tablet    Refill:  0   famotidine (PEPCID) 20 MG tablet    Sig: Take 1 tablet (20 mg total) by mouth 2 (two) times daily.    Dispense:  180 tablet    Refill:  4   predniSONE (DELTASONE) 20 MG tablet    Sig: Take two tablets daily for 4 days followed by one tablet daily for 4 days    Dispense:  12 tablet    Refill:  0   cyanocobalamin (VITAMIN B12) 1000 MCG tablet    Sig: Take 1 tablet (1,000 mcg total) by mouth every Monday, Wednesday, and Friday.    Orders Placed This Encounter  Procedures   Vitamin B1   Sedimentation rate   C-reactive protein    Patient Instructions  Labs today  Allopurinol may be contributing to loose stools - hold for 2 weeks and monitor stools. If better, may stay off. If not better, restart allopurinol. Prednisone prescription printed out to fill if you get gout flare.  Otherwise, continue allopurinol 100mg  daily for prevention. When you have a gout flare, take colchicine and meloxicam.  Check with [facility] on immunization records (ie shingrix shots).  Bring us copy of your living will.  Drop vitamin B12 to Monday Wednesday Friday  Return in 6 months for follow up visit.   Follow up plan: Return in about 6 months (around 08/02/2023), or if symptoms worsen or fail to improve, for follow up visit.  Eustaquio BoydenJavier Regine Christian, MD

## 2023-02-01 DIAGNOSIS — R52 Pain, unspecified: Secondary | ICD-10-CM | POA: Insufficient documentation

## 2023-02-01 NOTE — Assessment & Plan Note (Signed)
Notes chronic numbness to right anterior shin - ongoing since back surgeries, stable.

## 2023-02-01 NOTE — Assessment & Plan Note (Signed)
Levels now elevated - rec drop B12 replacement to MWF dosing.

## 2023-02-01 NOTE — Assessment & Plan Note (Signed)
Encouraged cessation.

## 2023-02-01 NOTE — Assessment & Plan Note (Addendum)
Notes intermittent loose stools a few times a week.  They feel loose stools may have started after allopurinol commencement - ?side effect. See above - hold allopurinol x2 wks and monitor effect on loose stools. If ongoing, restart allopurinol.

## 2023-02-01 NOTE — Assessment & Plan Note (Signed)
Managed with pantoprazole in am and pepcid in evening

## 2023-02-01 NOTE — Assessment & Plan Note (Signed)
BP stable on current regimen - continue. This is followed by cardiology.

## 2023-02-01 NOTE — Assessment & Plan Note (Signed)
Recent R leg pain not consistent with gout. Anticipate more sciatica in known lumbar radiculopathy. He has upcoming back doctor appointment.

## 2023-02-01 NOTE — Assessment & Plan Note (Signed)
Continue miralax and colace as needed.

## 2023-02-01 NOTE — Assessment & Plan Note (Signed)
Chroic, stable period on simvastatin - continue. The ASCVD Risk score (Arnett DK, et al., 2019) failed to calculate for the following reasons:   The 2019 ASCVD risk score is only valid for ages 27 to 73

## 2023-02-01 NOTE — Assessment & Plan Note (Addendum)
Urate levels now normal on allopurinol 100mg  daily.  Current pain not consistent with gout flare.  See below regarding loose stools.  Hold allopurinol x 2 wks, continue meloxicam and colchicine prn acute gout flare.  As we're holding allopurinol, will Rx prednisone taper as "Wait and see prescription" with indications when to fill.

## 2023-02-01 NOTE — Assessment & Plan Note (Addendum)
Notes worsening polyarthralgia over the past 1+ week without stiffness.  ?PMR - check ESR/CRP.

## 2023-02-01 NOTE — Assessment & Plan Note (Addendum)
Has not recently seen urology.  PSA stable, on flomax through cardiology. Myrbetriq initially helpful but he feels it lost effect

## 2023-02-03 ENCOUNTER — Other Ambulatory Visit: Payer: Self-pay | Admitting: Family Medicine

## 2023-02-03 LAB — VITAMIN B1: Vitamin B1 (Thiamine): 7 nmol/L — ABNORMAL LOW (ref 8–30)

## 2023-02-03 MED ORDER — THIAMINE HCL 100 MG PO TABS
100.0000 mg | ORAL_TABLET | ORAL | Status: DC
Start: 2023-02-05 — End: 2023-08-23

## 2023-02-07 ENCOUNTER — Other Ambulatory Visit: Payer: Self-pay | Admitting: Cardiology

## 2023-02-10 ENCOUNTER — Ambulatory Visit
Admission: RE | Admit: 2023-02-10 | Discharge: 2023-02-10 | Disposition: A | Discharge status: Home / Self Care | Payer: Medicare Other | Source: Ambulatory Visit | Attending: Physician Assistant | Admitting: Physician Assistant

## 2023-02-10 DIAGNOSIS — M5416 Radiculopathy, lumbar region: Secondary | ICD-10-CM

## 2023-02-15 DIAGNOSIS — M48062 Spinal stenosis, lumbar region with neurogenic claudication: Secondary | ICD-10-CM | POA: Diagnosis not present

## 2023-02-15 DIAGNOSIS — M5416 Radiculopathy, lumbar region: Secondary | ICD-10-CM | POA: Diagnosis not present

## 2023-03-26 ENCOUNTER — Other Ambulatory Visit: Payer: Self-pay | Admitting: Cardiology

## 2023-05-03 ENCOUNTER — Ambulatory Visit: Payer: Medicare Other | Admitting: Dermatology

## 2023-05-28 ENCOUNTER — Telehealth: Payer: Self-pay | Admitting: Cardiology

## 2023-05-28 NOTE — Telephone Encounter (Signed)
Wife states she feel patient has PAD. She states his legs and feet is always hurting and he does not like to walk or stand for a long period of time. She stated she feels he has poor circulation in his legs and feet. Scheduled appointment with APP

## 2023-05-28 NOTE — Telephone Encounter (Signed)
Pt's wife would like a callback from nurse as to she has questions regarding Peripheral Artery Disease. Wife seems to think that pt may have this . Please advise

## 2023-06-01 NOTE — Progress Notes (Unsigned)
Cardiology Clinic Note   Patient Name: Adrian Davis Date of Encounter: 06/01/2023  Primary Care Provider:  Eustaquio Boyden, MD Primary Cardiologist:  [name], MD  Patient Profile    Adrian Davis 82 year old male presents to the clinic today for follow-up evaluation of his essential hypertension and hyperlipidemia.  Past Medical History    Past Medical History:  Diagnosis Date   Abnormal echocardiogram Jan 2012   grade 1 diastolic dysfunction, normal EF, mild LVH   Anemia    Basal cell carcinoma 04/08/2019   left nose inferior to nasal alar groove. EDC: 05/20/2019   Bell palsy 10/2010   presented with facial numbness   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    Degenerative disk disease    GERD (gastroesophageal reflux disease)    History of kidney stones    Hyperlipidemia    Hypertension    dr [name]   Kidney cysts    Lumbar stenosis    Nephrolithiasis    Normal nuclear stress test 2008   Osteoarthritis    Skin cancer    Vocal cord leukoplakia    Dr Lazarus Salines   Past Surgical History:  Procedure Laterality Date   COLONOSCOPY  04/2019   TAx2, hemorrhoids, rpt 94yrs (Mansouraty)   ESOPHAGOGASTRODUODENOSCOPY  04/2019   erosive gastropathy, biopsy negative for barrett's (Mansouraty)   KNEE ARTHROSCOPY Right    LUMBAR LAMINECTOMY/DECOMPRESSION MICRODISCECTOMY N/A 11/12/2013   Cristi Loron, MD - complicated by urinary retention   MULTIPLE TOOTH EXTRACTIONS     SHOULDER SURGERY Bilateral    TONSILLECTOMY     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  He underwent stress testing in 2008 which was normal.  Echocardiogram 1/12 showed normal LVEF, G1 DD, and mild LVH.  He was seen in follow-up by Dr. Sw[place]iland on 08/30/2022.  During that time he remained stable from a cardiac standpoint.  He denied chest pain, palpitations, shortness of breath and dizziness.  He did note back pain and pain going down his leg.  He was planning for epidural injection.  He was noted to have lower extremity swelling and admitted to fairly high salt intake.  He was eating [employer] crackers and pork and beans for lunch.  His amlodipine and benazepril were continued.  He presents to the clinic today for follow-up evaluation and states***.  *** denies chest pain, shortness of breath, lower extremity edema, fatigue, palpitations, melena, hematuria, hemoptysis, diaphoresis, weakness, presyncope, syncope, orthopnea, and PND.   Essential hypertension-BP today***. Maintain blood pressure log Heart healthy low-sodium diet Continue amlodipine, benazepril, hydrochlorothiazide  Hyperlipidemia-LDL***. High-fiber diet Continue simvastatin Increase physical activity as tolerated  GERD-GERD diet reviewed. Continue Protonix GERD diet instructions given Encouraged weight loss  Disposition: Follow-up with Dr. Swaziland or me in 6 months.  Home Medications    Prior to Admission medications   Medication Sig Start Date End Date Taking? Authorizing Provider  acetaminophen (TYLENOL) 650 MG CR tablet Take 1,300 mg by mouth every 8 (  eight) hours as needed for pain.    [provider]  allopurinol (ZYLOPRIM) 100 MG tablet Take 1 tablet (100 mg total) by mouth daily. 12/22/22   Eustaquio Boyden, MD  amLODipine (NORVASC) 2.5 MG tablet TAKE 2 TABLETS BY MOUTH EVERY MORNING AND 1 TABLET IN THE AFTERNOON AS DIRECTED 06/14/22   [place], Peter M, MD  aspirin 81 MG  chewable tablet Chew by mouth daily.    [provider]  benazepril (LOTENSIN) 20 MG tablet Take 1 tablet (20 mg total) by mouth 2 (two) times daily. 08/30/22   [place], Peter M, MD  ciclopirox [facility]) 8 % solution Apply topically at bedtime. Apply over nail and surrounding skin. Apply daily over previous coat. After seven (7) days, may remove with alcohol and continue cycle. 12/06/22   Eustaquio Boyden, MD  colchicine 0.6 MG tablet Take 2 tablets by mouth at gout onset, then take 1 tablet daily until gout flare resolved 01/31/23   Eustaquio Boyden, MD  cyanocobalamin (VITAMIN B12) 1000 MCG tablet Take 1 tablet (1,000 mcg total) by mouth every Monday, Wednesday, and Friday. 01/31/23   Eustaquio Boyden, MD  docusate sodium (COLACE) 100 MG capsule Take 1 capsule (100 mg total) by mouth 2 (two) times daily. Patient taking differently: Take 100 mg by mouth 2 (two) times daily as needed for moderate constipation. 06/10/20   [place], Peter M, MD  famotidine (PEPCID) 20 MG tablet Take 1 tablet (20 mg total) by mouth 2 (two) times daily. 01/31/23   Eustaquio Boyden, MD  gabapentin (NEURONTIN) 100 MG capsule take 1-3 capsules 3 times a day as instructed. Patient not taking: Reported on 01/24/2023 10/30/22   [provider]  hydrochlorothiazide (HYDRODIURIL) 25 MG tablet TAKE ONE TABLET BY MOUTH ONCE A DAY AS NEEDED FOR SWELLING 03/27/23   [place], Peter M, MD  ketoconazole (NIZORAL) 2 % cream Apply 1 application topically daily as needed for irritation.    [provider]  loratadine (CLARITIN) 10 MG tablet Take 10 mg by mouth daily.    [provider]  meloxicam (MOBIC) 7.5 MG tablet Take 7.5 mg by mouth daily. Patient not taking: Reported on 01/24/2023 10/04/21   [provider]  pantoprazole (PROTONIX) 40 MG tablet TAKE 1 TABLET BY MOUTH TWICE A DAY 07/10/22   [place], Peter M, MD  polyethylene glycol powder (GLYCOLAX/MIRALAX) 17 GM/SCOOP powder TAKE 17 GRAMS BY MOUTH TWICE  DAILY Patient taking differently: Take 17 g by mouth at bedtime. Takes as needed 09/28/20   Mansouraty, Netty Starring., MD  potassium chloride (KLOR-CON) 10 MEQ tablet TAKE ONE TABLET BY MOUTH ONCE A DAY 02/07/23   [place], Peter M, MD  predniSONE (DELTASONE) 20 MG tablet Take two tablets daily for 4 days followed by one tablet daily for 4 days 01/31/23   Eustaquio Boyden, MD  simvastatin (ZOCOR) 10 MG tablet TAKE 1 TABLET BY MOUTH ONCE A DAY AT [facility] THE EVENING 11/20/22   [place], Peter M, MD  tamsulosin (FLOMAX) 0.4 MG CAPS capsule TAKE ONE CAPSULE BY MOUTH ONCE DAILY AFTER SUPPER 01/30/23   [place], Peter M, MD  thiamine (VITAMIN B1) 100 MG tablet Take 1 tablet (100 mg total) by mouth every Monday, Wednesday, and Friday. 02/05/23   Eustaquio Boyden, MD  traMADol (ULTRAM) 50 MG tablet Take 50 mg by mouth every 6 (six) hours as needed.    [provider]    Family History    Family History  Problem Relation Age of Onset   Heart failure  Father 27   Hypertension Father    Arthritis Father    Anemia Mother        needed regular transfusions   Prostate cancer Son    Cancer Neg Hx    Diabetes Neg Hx    Colon cancer Neg Hx    Rectal cancer Neg Hx    Stomach cancer Neg Hx    Inflammatory bowel disease Neg Hx    Liver disease Neg Hx    Pancreatic cancer Neg Hx    He indicated that his mother is deceased. He indicated that his father is deceased. He indicated that his maternal grandmother is deceased. He indicated that his maternal grandfather is deceased. He indicated that his paternal grandmother is deceased. He indicated that his paternal grandfather is deceased. He indicated that his son is alive. He indicated that the status of his child is unknown and reported the following: x15. He indicated that the status of his neg hx is unknown.  Social History    Social History   Socioeconomic History   Marital status: Married    Spouse name: Not on file   Number of children: 1   Years of  education: Not on file   Highest education level: Not on file  Occupational History   Occupation: farmer  Tobacco Use   Smoking status: Former    Current packs/day: 0.00    Average packs/day: 1.5 packs/day for 10.0 years (15.0 ttl pk-yrs)    Types: Cigarettes    Start date: 05/15/1966    Quit date: 05/15/1976    Years since quitting: 47.0   Smokeless tobacco: Current    Types: Chew   Tobacco comments:    chews tobacco  Vaping Use   Vaping status: Never Used  Substance and Sexual Activity   Alcohol use: Yes    Comment: rare   Drug use: No   Sexual activity: Not on file  Other Topics Concern   Not on file  Social History Narrative   Lives with wife   Occ: farm   Edu: HS   Activity: active on farm   Diet: good water, fruits/vegetables daily   Social Determinants of Health   Financial Resource Strain: Low Risk  (08/22/2022)   Overall Financial Resource Strain (CARDIA)    Difficulty of Paying Living Expenses: Not hard at all  Food Insecurity: No Food Insecurity (08/22/2022)   Hunger Vital Sign    Worried About Running Out of Food in the Last Year: Never true    Ran Out of Food in the Last Year: Never true  Transportation Needs: No Transportation Needs (08/22/2022)   PRAPARE - Administrator, Civil Service (Medical): No    Lack of Transportation (Non-Medical): No  Physical Activity: Not on file  Stress: No Stress Concern Present (08/22/2022)   [employer] of Occupational Health - Occupational Stress Questionnaire    Feeling of Stress : Not at all  Social Connections: Socially Integrated (08/22/2022)   Social Connection and Isolation Panel [NHANES]    Frequency of Communication with Friends and Family: More than three times a week    Frequency of Social Gatherings with Friends and Family: More than three times a week    Attends Religious Services: More than 4 times per year    Active Member of [employer] or Organizations: Yes    Attends Hospital doctor: More than 4 times per year    Marital Status: Married  Catering manager Violence: Not At  Risk (08/22/2022)   Humiliation, Afraid, Rape, and Kick questionnaire    Fear of Current or Ex-Partner: No    Emotionally Abused: No    Physically Abused: No    Sexually Abused: No     Review of Systems    General:  No chills, fever, night sweats or weight changes.  Cardiovascular:  No chest pain, dyspnea on exertion, edema, orthopnea, palpitations, paroxysmal nocturnal dyspnea. Dermatological: No rash, lesions/masses Respiratory: No cough, dyspnea Urologic: No hematuria, dysuria Abdominal:   No nausea, vomiting, diarrhea, bright red blood per rectum, melena, or hematemesis Neurologic:  No visual changes, wkns, changes in mental status. All other systems reviewed and are otherwise negative except as noted above.  Physical Exam    VS:  There were no vitals taken for this visit. , BMI There is no height or weight on file to calculate BMI. GEN: Well nourished, well developed, in no acute distress. HEENT: normal. Neck: Supple, no JVD, carotid bruits, or masses. Cardiac: RRR, no murmurs, rubs, or gallops. No clubbing, cyanosis, edema.  Radials/DP/PT 2+ and equal bilaterally.  Respiratory:  Respirations regular and unlabored, clear to auscultation bilaterally. GI: Soft, nontender, nondistended, BS + x 4. MS: no deformity or atrophy. Skin: warm and dry, no rash. Neuro:  Strength and sensation are intact. Psych: Normal affect.  Accessory Clinical Findings    Recent Labs: 08/24/2022: Hemoglobin 14.3; Platelets 251 01/24/2023: ALT 11; BUN 18; Creatinine, Ser 0.89; Potassium 3.6; Sodium 139   Recent Lipid Panel    Component Value Date/Time   CHOL 132 01/24/2023 0748   CHOL 149 08/24/2022 0742   TRIG 180.0 (H) 01/24/2023 0748   HDL 37.30 (L) 01/24/2023 0748   HDL 35 (L) 08/24/2022 0742   CHOLHDL 4 01/24/2023 0748   VLDL 36.0 01/24/2023 0748   LDLCALC 59 01/24/2023 0748   LDLCALC 83  08/24/2022 0742   LDLDIRECT 102.0 05/15/2011 0809    No BP recorded.  {Refresh Note OR Click here to enter BP  :1}***    ECG personally reviewed by me today- ***    Echocardiogram 07/02/2007  LEFT VENTRICLE:   -  Left ventricular size was normal.   -  Overall left ventricular systolic function was normal.   -  Left ventricular ejection fraction was estimated to be 55 %.   -  There was no diagnostic evidence of left ventricular regional         wall motion abnormalities.   -  Left ventricular wall thickness was mildly increased.     Doppler interpretation(s):   -  Left ventricular diastolic function parameters were normal.    AORTIC VALVE:   -  The aortic valve was grossly normal.     Doppler interpretation(s):   -  There was no significant aortic valvular regurgitation.    AORTA:   -  The aortic root was normal in size.    MITRAL VALVE:   -  The mitral valve was grossly normal.     Doppler interpretation(s):   -  There was mild mitral valvular regurgitation.    LEFT ATRIUM:   -  The left atrium was mildly dilated.    PULMONARY VEINS:   Doppler interpretation(s):   -  The Doppler flow pattern was normal.    RIGHT VENTRICLE:   -  Right ventricular size was normal.   -  Right ventricular systolic function was normal.   -  The estimated peak right ventricular systolic pressure was within  normal range.    PULMONIC VALVE:   -  The pulmonic valve was not well visualized.    TRICUSPID VALVE:   Doppler interpretation(s):   -  There was mild tricuspid valvular regurgitation.    PULMONARY ARTERY:   -  Pulmonary artery size could not be determined.    RIGHT ATRIUM:   -  Right atrial size was normal.    PERICARDIUM:   -  There was no pericardial effusion.     ---------------------------------------------------------------    SUMMARY   -  Overall left ventricular systolic function was normal. Left         ventricular ejection fraction was estimated to be  55 %. There         was no diagnostic evidence of left ventricular regional wall         motion abnormalities. Left ventricular wall thickness was         mildly increased. Left ventricular diastolic function         parameters were normal.   -  There was mild mitral valvular regurgitation.   -  The left atrium was mildly dilated.       Assessment & Plan   1.  ***   Thomasene Ripple.  NP-C     06/01/2023, 7:46 AM [facility] [address] Office [phone] Fax [phone]    I spent***minutes examining this patient, reviewing medications, and using patient centered shared decision making involving her cardiac care.  Prior to her visit I spent greater than 20 minutes reviewing her past medical history,  medications, and prior cardiac tests.

## 2023-06-04 ENCOUNTER — Encounter: Payer: Self-pay | Admitting: General Practice

## 2023-06-04 ENCOUNTER — Ambulatory Visit: Payer: Medicare Other | Attending: General Practice | Admitting: General Practice

## 2023-06-04 VITALS — BP 132/76 | HR 71 | Ht 68.0 in | Wt 171.4 lb

## 2023-06-04 DIAGNOSIS — E7800 Pure hypercholesterolemia, unspecified: Secondary | ICD-10-CM | POA: Insufficient documentation

## 2023-06-04 DIAGNOSIS — K2100 Gastro-esophageal reflux disease with esophagitis, without bleeding: Secondary | ICD-10-CM | POA: Insufficient documentation

## 2023-06-04 DIAGNOSIS — R52 Pain, unspecified: Secondary | ICD-10-CM | POA: Insufficient documentation

## 2023-06-04 DIAGNOSIS — I10 Essential (primary) hypertension: Secondary | ICD-10-CM | POA: Insufficient documentation

## 2023-06-04 DIAGNOSIS — M549 Dorsalgia, unspecified: Secondary | ICD-10-CM | POA: Insufficient documentation

## 2023-06-04 NOTE — Patient Instructions (Addendum)
Medication Instructions:  The current medical regimen is effective;  continue present plan and medications as directed. Please refer to the Current Medication list given to you today.  *If you need a refill on your cardiac medications before your next appointment, please call your pharmacy*  Lab Work: NONE If you have labs (blood work) drawn today and your tests are completely normal, you will receive your results only by:  MyChart Message (if you have MyChart) OR  A paper copy in the mail If you have any lab test that is abnormal or we need to change your treatment, we will call you to review the results.  Other Instructions REFERRAL TO PHYSICAL THERAPY   Follow-Up: At [facility], you and your health needs are our priority.  As part of our continuing mission to provide you with exceptional heart care, we have created designated Provider Care Teams.  These Care Teams include your primary Cardiologist (physician) and Advanced Practice Providers (APPs -  Physician Assistants and Nurse Practitioners) who all work together to provide you with the care you need, when you need it.  We recommend signing up for the patient portal called "MyChart".  Sign up information is provided on this After Visit Summary.  MyChart is used to connect with patients for Virtual Visits (Telemedicine).  Patients are able to view lab/test results, encounter notes, upcoming appointments, etc.  Non-urgent messages can be sent to your provider as well.   To learn more about what you can do with MyChart, go to [email or web address].    Your next appointment:   KEEP SCHEDULED    Provider:   DR [name]

## 2023-06-26 NOTE — Therapy (Signed)
OUTPATIENT PHYSICAL THERAPY THORACOLUMBAR EVALUATION   Patient Name: Adrian Davis MRN: [id number] DOB:29-May-1941, 82 y.o., male Today's Date: 06/29/2023  END OF SESSION:  PT End of Session - 06/28/23 0854     Visit Number 1    Number of Visits 13    Date for PT Re-Evaluation 08/09/23    Authorization Type MEDICARE PART A AND B; BCBS SUPPLEMENT    PT Start Time 0845    PT Stop Time 0930    PT Time Calculation (min) 45 min    Activity Tolerance Patient tolerated treatment well    Behavior During Therapy [facility] for tasks assessed/performed             Past Medical History:  Diagnosis Date   Abnormal echocardiogram Jan 2012   grade 1 diastolic dysfunction, normal EF, mild LVH   Anemia    Basal cell carcinoma 04/08/2019   left nose inferior to nasal alar groove. EDC: 05/20/2019   Bell palsy 10/2010   presented with facial numbness   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    Degenerative disk disease    GERD (gastroesophageal reflux disease)    History of kidney stones    Hyperlipidemia    Hypertension    dr [name]   Kidney cysts    Lumbar stenosis    Nephrolithiasis    Normal nuclear stress test 2008   Osteoarthritis    Skin cancer    Vocal cord leukoplakia    Dr Lazarus Salines   Past Surgical History:  Procedure Laterality Date   COLONOSCOPY  04/2019   TAx2, hemorrhoids, rpt 45yrs ([place])   ESOPHAGOGASTRODUODENOSCOPY  04/2019   erosive gastropathy, biopsy negative for barrett's ([place])   KNEE ARTHROSCOPY Right    LUMBAR LAMINECTOMY/DECOMPRESSION MICRODISCECTOMY N/A 11/12/2013   Cristi Loron, MD - complicated by urinary retention   MULTIPLE TOOTH EXTRACTIONS     SHOULDER SURGERY Bilateral    TONSILLECTOMY     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   Procedure: TOTAL HIP ARTHROPLASTY ANTERIOR APPROACH;  Surgeon: Ollen Gross, MD;  Location: [facility];  Service: Orthopedics;  Laterality: Right;    TOTAL KNEE ARTHROPLASTY Left  11/21/2021   Procedure: TOTAL KNEE ARTHROPLASTY;  Surgeon: Ollen Gross, MD;  Location: [facility];  Service: Orthopedics;  Laterality: Left;   Patient Active Problem List   Diagnosis Date Noted   Body aches 02/01/2023   Diarrhea 01/24/2023   Pain of toe of right foot 01/24/2023   Pain of right lower extremity 01/24/2023   Hammer toe of right foot 12/07/2022   Chronic gout 11/17/2022   Tick bites 03/01/2022   Primary osteoarthritis of left knee 11/21/2021   Skin rash 11/11/2021   OA (osteoarthritis) of knee 10/11/2021   DDD (degenerative disc disease), lumbar 10/04/2021   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   Fatigue 03/14/2021   Primary osteoarthritis of right hip 11/10/2020   Pre-op evaluation 11/04/2020   Smokeless tobacco use 11/04/2020   History of urinary retention 11/04/2020   Chronic constipation 01/21/2020   Nephrolithiasis 11/14/2018   Acquired complex renal cyst 11/14/2018   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   Lower abdominal pain 07/24/2018   Unintentional weight loss 07/24/2018   Vitamin B12 deficiency 01/21/2018   Actinic keratosis 01/16/2017   Medicare annual wellness visit, subsequent 01/03/2017   Counseling about travel 01/03/2017   Advanced care planning/counseling discussion 01/03/2017   Bilateral impacted cerumen 07/25/2016   Vocal cord leukoplakia    Overweight (BMI 25.0-29.9) 02/22/2016  Paresthesias 02/22/2016   GERD (gastroesophageal reflux disease)    OA (osteoarthritis) of hip    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   Hyperlipidemia    Essential hypertension     PCP: Eustaquio Boyden, MD  REFERRING PROVIDER: Ronney Asters, NP   REFERRING DIAG: M54.9 (ICD-10-CM) - Back pain, unspecified back location, unspecified back pain laterality, unspecified chronicity   Rationale for Evaluation and Treatment:  Rehabilitation  THERAPY DIAG:  Chronic right-sided low back pain with right-sided sciatica  Muscle weakness (generalized)  Difficulty in walking, not elsewhere classified  ONSET DATE: 1.5 years  SUBJECTIVE:                                                                                                                                                                                           SUBJECTIVE STATEMENT: Pt reports chronic Hx of R low back and radiating pain down the ant/lat R leg extending to his foot at it's worst. Pt reports he has been told the L5 vertebrae is shifted to the L and the L4 is shifted forward. The pain is significantly aggravated by prolonged standing and walking. Pt notes he can ride working in his tractor for long periods, but he is very stiff with transition to standing. Occasional N/T R LE. Pt states on 2 occasions low back injection helped his pain, but not the last one.  PERTINENT HISTORY:  R THR and L TKR  PAIN:  Are you having pain? Yes: NPRS scale: 2-10/10 Pain location: R low back, gluteal to R foot Pain description: Ache progresses to sharp Aggravating factors: standing 1 min; walking 5 mins Relieving factors: Sitting, hot, tylenol  PRECAUTIONS: None  RED FLAGS: None   WEIGHT BEARING RESTRICTIONS: No  FALLS:  Has patient fallen in last 6 months? No  LIVING ENVIRONMENT: Lives with: lives with their family Lives in: House/apartment  OCCUPATION: [name]  PLOF: Independent  PATIENT GOALS: decreased pain  NEXT MD VISIT: To be scheduled  OBJECTIVE:   DIAGNOSTIC FINDINGS:  NA  PATIENT SURVEYS:  FOTO: Perceived function   42%, predicted   58%   SCREENING FOR RED FLAGS: Bowel or bladder incontinence: No Cauda equina syndrome: No  COGNITION: Overall cognitive status: Within functional limits for tasks assessed     SENSATION: WFL  MUSCLE LENGTH: Hamstrings: Right 40 deg; Left 50 deg Thomas test: Right tight ; Left  tight  POSTURE: rounded shoulders, forward head, increased thoracic kyphosis, and flexed trunk   PALPATION: TTP R gluteal area  LUMBAR ROM:  AROM eval  Flexion Full, tight hamstrings  Extension Min  Right lateral flexion Min  Left lateral flexion Min  Right rotation Min   Left rotation Min   (Blank rows = not tested)  LOWER EXTREMITY ROM:     Passive  Right eval Left eval  Hip flexion    Hip extension    Hip abduction    Hip adduction    Hip internal rotation  Tight and painful  Hip external rotation    Knee flexion    Knee extension    Ankle dorsiflexion Weak, equal to L, pt reports Hx of drop foot   Ankle plantarflexion    Ankle inversion    Ankle eversion     (Blank rows = not tested)  LOWER EXTREMITY MMT:    MMT Right eval Left eval  Hip flexion 4 4+  Hip extension 4 4  Hip abduction 4 4+  Hip adduction 4 4+  Hip internal rotation 4 4+  Hip external rotation 4 4+  Knee flexion    Knee extension    Ankle dorsiflexion    Ankle plantarflexion    Ankle inversion    Ankle eversion     (Blank rows = not tested)  LUMBAR SPECIAL TESTS:  Straight leg raise test: Negative, Slump test: Negative, SI Compression/distraction test: Negative, and FABER test: Positive L  FUNCTIONAL TESTS:  NT  GAIT: Distance walked: 200' Assistive device utilized: None Level of assistance: Complete Independence Comments: Decreased DF R>L  TODAY'S TREATMENT:                                                                                                                              [facility] Adult PT Treatment:                                                DATE: 06/28/23 Therapeutic Exercise: Developed, instructed in, and pt completed therex as noted in HEP  Self Care: To break up prolonged sitting with standing breaks every 30 mins  PATIENT EDUCATION:  Education details: Eval findings, POC, HEP, self care  Person educated: Patient Education method: Explanation,  Demonstration, Tactile cues, Verbal cues, and Handouts Education comprehension: verbalized understanding, returned demonstration, verbal cues required, and tactile cues required  HOME EXERCISE PROGRAM: Access Code: [email or web address] URL: [email or web address] Date: 06/28/2023 Prepared by: Joellyn Rued  Exercises - Supine Lower Trunk Rotation  - 2 x daily - 7 x weekly - 1 sets - 10 reps - 5 hold - Supine Posterior Pelvic Tilt  - 2 x daily - 7 x weekly - 1 sets - 10 reps - 3 hold - Hooklying Single Knee to Chest  - 1 x daily - 7 x weekly - 1 sets - 4 reps - 30 hold - Supine Double Knee to Chest  - 1  x daily - 7 x weekly - 1 sets - 3 reps - 30 hold - Seated Hamstring Stretch  - 1 x daily - 7 x weekly - 1 sets - 3 reps - 30 hold - Seated Flexion Stretch  - 1 x daily - 7 x weekly - 1 sets - 3 reps - 15-30 hold  ASSESSMENT:  CLINICAL IMPRESSION: Patient is a 81 y.o. male who was seen today for physical therapy evaluation and treatment for M54.9 (ICD-10-CM) - Back pain, unspecified back location, unspecified back pain laterality, unspecified chronicity .   OBJECTIVE IMPAIRMENTS: decreased activity tolerance, difficulty walking, decreased ROM, decreased strength, impaired flexibility, and pain.   ACTIVITY LIMITATIONS: lifting, standing, and locomotion level  PARTICIPATION LIMITATIONS: occupation  PERSONAL FACTORS: Age, Past/current experiences, and Time since onset of injury/illness/exacerbation are also affecting patient's functional outcome.   REHAB POTENTIAL: Good  CLINICAL DECISION MAKING: Stable/uncomplicated  EVALUATION COMPLEXITY: Low   GOALS:  SHORT TERM GOALS= LTGs   LONG TERM GOALS: Target date: 08/09/23  Pt will be Ind in a final HEP to maintain achieved LOF  Baseline: started Goal status: INITIAL  2.  Increase bilat hamstring to 50d to minimize low back strain Baseline: 40d Goal status: INITIAL  3.  Pt will report improved standing and walking tolerance to  20 mins each for improved function Baseline: Standing 1 min; walking 5 min Goal status: INITIAL  4.  Pt will report a 50% improvement with his R low back and LE pain Baseline: 0-10/10 Goal status: INITIAL  5.  Pt's FOTO score will improved to the predicted value of 58% as indication of improved function  Baseline: 42% Goal status: INITIAL  6.  Increase R leg strength to 4+/5 for improved pelvic stability Baseline: 4/5 Goal status: INITIAL  PLAN:  PT FREQUENCY: 2x/week  PT DURATION: 6 weeks  PLANNED INTERVENTIONS: Therapeutic exercises, Therapeutic activity, Gait training, Patient/Family education, Self Care, Joint mobilization, Aquatic Therapy, Dry Needling, Spinal mobilization, Cryotherapy, Moist heat, Taping, Traction, Ultrasound, Ionotophoresis 4mg /ml Dexamethasone, Manual therapy, and Re-evaluation.  PLAN FOR NEXT SESSION: Review FOTO; assess response to HEP; progress therex as indicated; use of modalities, manual therapy; and TPDN as indicated.  Kelissa Merlin MS, PT 06/30/23 4:27 PM

## 2023-06-27 ENCOUNTER — Telehealth: Payer: Self-pay | Admitting: Cardiology

## 2023-06-27 NOTE — Telephone Encounter (Signed)
Returned call to pt wife. LVM for wife to return call to the office.

## 2023-06-27 NOTE — Telephone Encounter (Signed)
Patient's wife is requesting call back to discuss if patient needs labs done and where she would like them to be done at. Please advise.

## 2023-06-28 ENCOUNTER — Other Ambulatory Visit: Payer: Self-pay

## 2023-06-28 ENCOUNTER — Ambulatory Visit: Payer: Medicare Other | Attending: General Practice

## 2023-06-28 DIAGNOSIS — M6281 Muscle weakness (generalized): Secondary | ICD-10-CM | POA: Diagnosis not present

## 2023-06-28 DIAGNOSIS — R52 Pain, unspecified: Secondary | ICD-10-CM | POA: Diagnosis not present

## 2023-06-28 DIAGNOSIS — M5441 Lumbago with sciatica, right side: Secondary | ICD-10-CM | POA: Diagnosis not present

## 2023-06-28 DIAGNOSIS — G8929 Other chronic pain: Secondary | ICD-10-CM | POA: Insufficient documentation

## 2023-06-28 DIAGNOSIS — M549 Dorsalgia, unspecified: Secondary | ICD-10-CM | POA: Insufficient documentation

## 2023-06-28 DIAGNOSIS — R262 Difficulty in walking, not elsewhere classified: Secondary | ICD-10-CM | POA: Insufficient documentation

## 2023-06-28 NOTE — Telephone Encounter (Signed)
Returned call to pt's wife. LVM for wife to return call to the office.

## 2023-06-28 NOTE — Telephone Encounter (Signed)
Spoke with wife per DPR and should would like to know if patient needs labs before his visit on 07/04/23

## 2023-06-28 NOTE — Progress Notes (Signed)
Adrian Davis Date of Birth: 1941-01-09 Medical Record #[id number]  History of Present Illness: Adrian Davis is seen today for pre op clearance for left TKR. He has a history of HTN and HLD. No known CAD. He had a normal Adenosine Myoview back in 2008. His last echo was in January of 2012 showing grade 1 diastolic dysfunction, mild LVH and a normal EF.    He  underwent right THR on 11/10/20 by Dr Despina Hick. 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 He has spinal stenosis. Has seen Dr Lovell Sheehan and surgery recommended for this as well. Patient doesn't want a plate placed because he was told he wouldn't be able to tie his shoes.   He did undergo a left TKR on 11/21/21.   He is doing well from a cardiac standpoint.  Denies any chest pain, SOB, palpitations, dizziness. Still having a lot of back pain and going down his leg.    Current Outpatient Medications on File Prior to Visit  Medication Sig Dispense Refill   acetaminophen (TYLENOL) 650 MG CR tablet Take 1,300 mg by mouth every 8 (eight) hours as needed for pain.     allopurinol (ZYLOPRIM) 100 MG tablet Take 1 tablet (100 mg total) by mouth daily. 90 tablet 3   amLODipine (NORVASC) 2.5 MG tablet TAKE 2 TABLETS BY MOUTH EVERY MORNING AND 1 TABLET IN THE AFTERNOON AS DIRECTED 270 tablet 3   aspirin 81 MG chewable tablet Chew by mouth daily.     benazepril (LOTENSIN) 20 MG tablet Take 1 tablet (20 mg total) by mouth 2 (two) times daily. 180 tablet 3   ciclopirox (PENLAC) 8 % solution Apply topically at bedtime. Apply over nail and surrounding skin. Apply daily over previous coat. After seven (7) days, may remove with alcohol and continue cycle. 6.6 mL 0   colchicine 0.6 MG tablet Take 2 tablets by mouth at gout onset, then take 1 tablet daily until gout flare resolved 60 tablet 0   cyanocobalamin (VITAMIN B12) 1000 MCG tablet Take 1 tablet (1,000 mcg total) by mouth every Monday, Wednesday, and Friday.     Diclofenac Sodium (VOLTAREN ARTHRITIS PAIN EX)  Apply topically.     docusate sodium (COLACE) 100 MG capsule Take 1 capsule (100 mg total) by mouth 2 (two) times daily. (Patient taking differently: Take 100 mg by mouth 2 (two) times daily as needed for moderate constipation.) 180 capsule 3   famotidine (PEPCID) 20 MG tablet Take 1 tablet (20 mg total) by mouth 2 (two) times daily. 180 tablet 4   hydrochlorothiazide (HYDRODIURIL) 25 MG tablet TAKE ONE TABLET BY MOUTH ONCE A DAY AS NEEDED FOR SWELLING 90 tablet 1   ketoconazole (NIZORAL) 2 % cream Apply 1 application topically daily as needed for irritation.     loratadine (CLARITIN) 10 MG tablet Take 10 mg by mouth daily.     meloxicam (MOBIC) 7.5 MG tablet Take 7.5 mg by mouth daily.     pantoprazole (PROTONIX) 40 MG tablet TAKE 1 TABLET BY MOUTH TWICE A DAY 180 tablet 2   polyethylene glycol powder (GLYCOLAX/MIRALAX) 17 GM/SCOOP powder TAKE 17 GRAMS BY MOUTH TWICE DAILY (Patient taking differently: Take 17 g by mouth at bedtime. Takes as needed) 510 g 2   potassium chloride (KLOR-CON) 10 MEQ tablet TAKE ONE TABLET BY MOUTH ONCE A DAY 90 tablet 3   simvastatin (ZOCOR) 10 MG tablet TAKE 1 TABLET BY MOUTH ONCE A DAY AT [facility] THE EVENING 90 tablet  3   tamsulosin (FLOMAX) 0.4 MG CAPS capsule TAKE ONE CAPSULE BY MOUTH ONCE DAILY AFTER SUPPER 90 capsule 3   thiamine (VITAMIN B1) 100 MG tablet Take 1 tablet (100 mg total) by mouth every Monday, Wednesday, and Friday.     traMADol (ULTRAM) 50 MG tablet Take 50 mg by mouth every 6 (six) hours as needed.     gabapentin (NEURONTIN) 100 MG capsule  (Patient not taking: Reported on 07/04/2023)     predniSONE (DELTASONE) 20 MG tablet Take two tablets daily for 4 days followed by one tablet daily for 4 days (Patient not taking: Reported on 07/04/2023) 12 tablet 0   No current facility-administered medications on file prior to visit.    No Known Allergies  Past Medical History:  Diagnosis Date   Abnormal echocardiogram Jan 2012   grade 1 diastolic  dysfunction, normal EF, mild LVH   Anemia    Basal cell carcinoma 04/08/2019   left nose inferior to nasal alar groove. EDC: 05/20/2019   Bell palsy 10/2010   presented with facial numbness   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    Degenerative disk disease    GERD (gastroesophageal reflux disease)    History of kidney stones    Hyperlipidemia    Hypertension    dr Zianne Schubring [place]   Kidney cysts    Lumbar stenosis    Nephrolithiasis    Normal nuclear stress test 2008   Osteoarthritis    Skin cancer    Vocal cord leukoplakia    Dr Lazarus Salines    Past Surgical History:  Procedure Laterality Date   COLONOSCOPY  04/2019   TAx2, hemorrhoids, rpt 31yrs ([place])   ESOPHAGOGASTRODUODENOSCOPY  04/2019   erosive gastropathy, biopsy negative for barrett's ([place])   KNEE ARTHROSCOPY Right    LUMBAR LAMINECTOMY/DECOMPRESSION MICRODISCECTOMY N/A 11/12/2013   Cristi Loron, MD - complicated by urinary retention   MULTIPLE TOOTH EXTRACTIONS     SHOULDER SURGERY Bilateral    TONSILLECTOMY     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   Procedure: TOTAL HIP ARTHROPLASTY ANTERIOR APPROACH;  Surgeon: Ollen Gross, MD;  Location: [facility];  Service: Orthopedics;  Laterality: Right;    TOTAL KNEE ARTHROPLASTY Left 11/21/2021   Procedure: TOTAL KNEE ARTHROPLASTY;  Surgeon: Ollen Gross, MD;  Location: [facility];  Service: Orthopedics;  Laterality: Left;    Social History   Tobacco Use  Smoking Status Former   Current packs/day: 0.00   Average packs/day: 1.5 packs/day for 10.0 years (15.0 ttl pk-yrs)   Types: Cigarettes   Start date: 05/15/1966   Quit date: 05/15/1976   Years since quitting: 47.1  Smokeless Tobacco Current   Types: Chew  Tobacco Comments   chews tobacco    Social History   Substance and Sexual Activity  Alcohol Use Yes   Comment: rare    Family History  Problem Relation Age of Onset   Heart failure Father 36   Hypertension Father     Arthritis Father    Anemia Mother        needed regular transfusions   Prostate cancer Son    Cancer Neg Hx    Diabetes Neg Hx    Colon cancer Neg Hx    Rectal cancer Neg Hx    Stomach cancer Neg Hx    Inflammatory bowel disease Neg Hx    Liver disease Neg Hx    Pancreatic cancer Neg Hx     Review of Systems: The review  of systems is per the HPI.  All other systems were reviewed and are negative.  Physical Exam: BP 122/68   Pulse 87   Ht 5' 7.5" (1.715 m)   Wt 169 lb (76.7 kg)   SpO2 97%   BMI 26.08 kg/m  GENERAL:  Well appearing WM in NAD HEENT:  PERRL, EOMI, sclera are clear. Oropharynx is clear. NECK:  No jugular venous distention, carotid upstroke brisk and symmetric, no bruits, no thyromegaly or adenopathy LUNGS:  Clear to auscultation bilaterally CHEST:  Unremarkable HEART:  RRR,  PMI not displaced or sustained,S1 and S2 within normal limits, no S3, no S4: no clicks, no rubs, no murmurs ABD:  Soft, nontender. BS +, no masses or bruits. No hepatomegaly, no splenomegaly EXT:  2 + pulses throughout, no edema, no cyanosis no clubbing SKIN:  Warm and dry.  No rashes NEURO:  Alert and oriented x 3. Cranial nerves II through XII intact. PSYCH:  Cognitively intact   Laboratory data: Lab Results  Component Value Date   WBC 6.3 08/24/2022   HGB 14.3 08/24/2022   HCT 43.9 08/24/2022   PLT 251 08/24/2022   GLUCOSE 79 01/24/2023   CHOL 132 01/24/2023   TRIG 180.0 (H) 01/24/2023   HDL 37.30 (L) 01/24/2023   LDLDIRECT 102.0 05/15/2011   LDLCALC 59 01/24/2023   ALT 11 01/24/2023   AST 10 01/24/2023   NA 139 01/24/2023   K 3.6 01/24/2023   CL 106 01/24/2023   CREATININE 0.89 01/24/2023   BUN 18 01/24/2023   CO2 26 01/24/2023   TSH 1.86 03/14/2021   PSA 1.50 04/01/2019   INR 1.0 11/14/2021   HGBA1C 5.6 11/11/2021   Ecg not done today   Assessment / Plan: 1. HTN well controlled.  Continue amlodipine and lotensin.   2. Hyperlipidemia. Labs look good. Continue  current therapy. Encourage a healthier diet and less sodium and fat.    Follow up in 6 months

## 2023-06-29 NOTE — Telephone Encounter (Signed)
Spoke with patient he is aware he does not need labs before appointment

## 2023-07-02 ENCOUNTER — Encounter: Payer: Self-pay | Admitting: Physical Therapy

## 2023-07-02 ENCOUNTER — Ambulatory Visit: Payer: Medicare Other | Admitting: Physical Therapy

## 2023-07-02 DIAGNOSIS — R52 Pain, unspecified: Secondary | ICD-10-CM | POA: Diagnosis not present

## 2023-07-02 DIAGNOSIS — G8929 Other chronic pain: Secondary | ICD-10-CM | POA: Diagnosis not present

## 2023-07-02 DIAGNOSIS — R262 Difficulty in walking, not elsewhere classified: Secondary | ICD-10-CM

## 2023-07-02 DIAGNOSIS — M6281 Muscle weakness (generalized): Secondary | ICD-10-CM | POA: Diagnosis not present

## 2023-07-02 DIAGNOSIS — M5441 Lumbago with sciatica, right side: Secondary | ICD-10-CM | POA: Diagnosis not present

## 2023-07-02 DIAGNOSIS — M549 Dorsalgia, unspecified: Secondary | ICD-10-CM | POA: Diagnosis not present

## 2023-07-02 NOTE — Therapy (Signed)
OUTPATIENT PHYSICAL THERAPY THORACOLUMBAR TREATMENT    Patient Name: Adrian Davis MRN: [id number] DOB:08-30-1941, 82 y.o., male Today's Date: 07/02/2023  END OF SESSION:  PT End of Session - 07/02/23 0718     Visit Number 2    Number of Visits 13    Date for PT Re-Evaluation 08/09/23    Authorization Type MEDICARE PART A AND B; BCBS SUPPLEMENT    PT Start Time [id number]    PT Stop Time 0800    PT Time Calculation (min) 44 min             Past Medical History:  Diagnosis Date   Abnormal echocardiogram Jan 2012   grade 1 diastolic dysfunction, normal EF, mild LVH   Anemia    Basal cell carcinoma 04/08/2019   left nose inferior to nasal alar groove. EDC: 05/20/2019   Bell palsy 10/2010   presented with facial numbness   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    Degenerative disk disease    GERD (gastroesophageal reflux disease)    History of kidney stones    Hyperlipidemia    Hypertension    dr [name]   Kidney cysts    Lumbar stenosis    Nephrolithiasis    Normal nuclear stress test 2008   Osteoarthritis    Skin cancer    Vocal cord leukoplakia    Dr Lazarus Salines   Past Surgical History:  Procedure Laterality Date   COLONOSCOPY  04/2019   TAx2, hemorrhoids, rpt 63yrs (Adrian Davis)   ESOPHAGOGASTRODUODENOSCOPY  04/2019   erosive gastropathy, biopsy negative for barrett's (Adrian Davis)   KNEE ARTHROSCOPY Right    LUMBAR LAMINECTOMY/DECOMPRESSION MICRODISCECTOMY N/A 11/12/2013   Adrian Loron, MD - complicated by urinary retention   MULTIPLE TOOTH EXTRACTIONS     SHOULDER SURGERY Bilateral    TONSILLECTOMY     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   Procedure: TOTAL HIP ARTHROPLASTY ANTERIOR APPROACH;  Surgeon: Adrian Gross, MD;  Location: [facility];  Service: Orthopedics;  Laterality: Right;    TOTAL KNEE ARTHROPLASTY Left 11/21/2021   Procedure: TOTAL KNEE ARTHROPLASTY;  Surgeon: Adrian Gross, MD;  Location: [facility];  Service:  Orthopedics;  Laterality: Left;   Patient Active Problem List   Diagnosis Date Noted   Body aches 02/01/2023   Diarrhea 01/24/2023   Pain of toe of right foot 01/24/2023   Pain of right lower extremity 01/24/2023   Hammer toe of right foot 12/07/2022   Chronic gout 11/17/2022   Tick bites 03/01/2022   Primary osteoarthritis of left knee 11/21/2021   Skin rash 11/11/2021   OA (osteoarthritis) of knee 10/11/2021   DDD (degenerative disc disease), lumbar 10/04/2021   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   Fatigue 03/14/2021   Primary osteoarthritis of right hip 11/10/2020   Pre-op evaluation 11/04/2020   Smokeless tobacco use 11/04/2020   History of urinary retention 11/04/2020   Chronic constipation 01/21/2020   Nephrolithiasis 11/14/2018   Acquired complex renal cyst 11/14/2018   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   Lower abdominal pain 07/24/2018   Unintentional weight loss 07/24/2018   Vitamin B12 deficiency 01/21/2018   Actinic keratosis 01/16/2017   Medicare annual wellness visit, subsequent 01/03/2017   Counseling about travel 01/03/2017   Advanced care planning/counseling discussion 01/03/2017   Bilateral impacted cerumen 07/25/2016   Vocal cord leukoplakia    Overweight (BMI 25.0-29.9) 02/22/2016   Paresthesias 02/22/2016   GERD (gastroesophageal reflux disease)    OA (osteoarthritis) of hip  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   Hyperlipidemia    Essential hypertension     PCP: Adrian Boyden, MD  REFERRING PROVIDER: Ronney Asters, NP   REFERRING DIAG: M54.9 (ICD-10-CM) - Back pain, unspecified back location, unspecified back pain laterality, unspecified chronicity   Rationale for Evaluation and Treatment: Rehabilitation  THERAPY DIAG:  Chronic right-sided low back pain with right-sided sciatica  Muscle weakness  (generalized)  Difficulty in walking, not elsewhere classified  ONSET DATE: 1.5 years  SUBJECTIVE:                                                                                                                                                                                           SUBJECTIVE STATEMENT: 3/10 pain now. Pain will reach 10/10 later today. Physical therapy is the only thing I have not tried yet.    EVAL: Pt reports chronic Hx of R low back and radiating pain down the ant/lat R leg extending to his foot at it's worst. Pt reports he has been told the L5 vertebrae is shifted to the L and the L4 is shifted forward. The pain is significantly aggravated by prolonged standing and walking. Pt notes he can ride working in his tractor for long periods, but he is very stiff with transition to standing. Occasional N/T R LE. Pt states on 2 occasions low back injection helped his pain, but not the last one.  PERTINENT HISTORY:  R THR and L TKR  PAIN:  Are you having pain? Yes: NPRS scale: 2-10/10 Pain location: R low back, gluteal to R foot Pain description: Ache progresses to sharp Aggravating factors: standing 1 min; walking 5 mins Relieving factors: Sitting, hot, tylenol  PRECAUTIONS: None  RED FLAGS: None   WEIGHT BEARING RESTRICTIONS: No  FALLS:  Has patient fallen in last 6 months? No  LIVING ENVIRONMENT: Lives with: lives with their family Lives in: House/apartment  OCCUPATION: Adrian Davis  PLOF: Independent  PATIENT GOALS: decreased pain  NEXT MD VISIT: To be scheduled  OBJECTIVE:   DIAGNOSTIC FINDINGS:  NA  PATIENT SURVEYS:  FOTO: Perceived function   42%, predicted   58%   SCREENING FOR RED FLAGS: Bowel or bladder incontinence: No Cauda equina syndrome: No  COGNITION: Overall cognitive status: Within functional limits for tasks assessed     SENSATION: WFL  MUSCLE LENGTH: Hamstrings: Right 40 deg; Left 50 deg Thomas test: Right tight ; Left  tight  POSTURE: rounded shoulders, forward head, increased thoracic kyphosis, and flexed trunk   PALPATION: TTP  R gluteal area  LUMBAR ROM:   AROM eval  Flexion Full, tight hamstrings  Extension Min  Right lateral flexion Min  Left lateral flexion Min  Right rotation Min   Left rotation Min   (Blank rows = not tested)  LOWER EXTREMITY ROM:     Passive  Right eval Left eval  Hip flexion    Hip extension    Hip abduction    Hip adduction    Hip internal rotation  Tight and painful  Hip external rotation    Knee flexion    Knee extension    Ankle dorsiflexion Weak, equal to L, pt reports Hx of drop foot   Ankle plantarflexion    Ankle inversion    Ankle eversion     (Blank rows = not tested)  LOWER EXTREMITY MMT:    MMT Right eval Left eval  Hip flexion 4 4+  Hip extension 4 4  Hip abduction 4 4+  Hip adduction 4 4+  Hip internal rotation 4 4+  Hip external rotation 4 4+  Knee flexion    Knee extension    Ankle dorsiflexion    Ankle plantarflexion    Ankle inversion    Ankle eversion     (Blank rows = not tested)  LUMBAR SPECIAL TESTS:  Straight leg raise test: Negative, Slump test: Negative, SI Compression/distraction test: Negative, and FABER test: Positive L  FUNCTIONAL TESTS:  NT  GAIT: Distance walked: 200' Assistive device utilized: None Level of assistance: Complete Independence Comments: Decreased DF R>L  TODAY'S TREATMENT:                                                                                                                              [facility] Adult PT Treatment:                                                DATE: 07/02/23 Therapeutic Exercise: Nustep L5 UE/LE x 5 minutes  PPT SKTC LTR Seated Hamstring stretch x 3 each  Seated lumbar flexion 30 sec x 3  STS x 10  Therapeutic Activity: Hip hinge education with pt able to return demo with mod cues     [facility] Adult PT Treatment:                                                 DATE: 06/28/23 Therapeutic Exercise: Developed, instructed in, and pt completed therex as noted in HEP  Self Care: To break up prolonged sitting with standing breaks every 30 mins  PATIENT EDUCATION:  Education details: Eval findings, POC, HEP, self care  Person educated: Patient Education method: Explanation, Demonstration, Tactile cues, Verbal cues, and  Handouts Education comprehension: verbalized understanding, returned demonstration, verbal cues required, and tactile cues required  HOME EXERCISE PROGRAM: Access Code: [email or web address] URL: [email or web address] Date: 06/28/2023 Prepared by: Joellyn Rued  Exercises - Supine Lower Trunk Rotation  - 2 x daily - 7 x weekly - 1 sets - 10 reps - 5 hold - Supine Posterior Pelvic Tilt  - 2 x daily - 7 x weekly - 1 sets - 10 reps - 3 hold - Hooklying Single Knee to Chest  - 1 x daily - 7 x weekly - 1 sets - 4 reps - 30 hold - Supine Double Knee to Chest  - 1 x daily - 7 x weekly - 1 sets - 3 reps - 30 hold - Seated Hamstring Stretch  - 1 x daily - 7 x weekly - 1 sets - 3 reps - 30 hold - Seated Flexion Stretch  - 1 x daily - 7 x weekly - 1 sets - 3 reps - 15-30 hold STS  ASSESSMENT:  CLINICAL IMPRESSION: Pt reports compliance with HEP except for yesterday. He continues to have high levels of back pain by the end of the day reaching 10/10. Reviewed entire initial HEP. Began hip hinge education for lifting as pt frequently has to lift heavy items and this causes him significant pain. He reported decreased pain with bending via hip hinge. Will continue to reinforce. Updated HEP with STS.   EVAL: Patient is a 82 y.o. male who was seen today for physical therapy evaluation and treatment for M54.9 (ICD-10-CM) - Back pain, unspecified back location, unspecified back pain laterality, unspecified chronicity .   OBJECTIVE IMPAIRMENTS: decreased activity tolerance, difficulty walking, decreased ROM, decreased strength, impaired flexibility, and  pain.   ACTIVITY LIMITATIONS: lifting, standing, and locomotion level  PARTICIPATION LIMITATIONS: occupation  PERSONAL FACTORS: Age, Past/current experiences, and Time since onset of injury/illness/exacerbation are also affecting patient's functional outcome.   REHAB POTENTIAL: Good  CLINICAL DECISION MAKING: Stable/uncomplicated  EVALUATION COMPLEXITY: Low   GOALS:  SHORT TERM GOALS= LTGs   LONG TERM GOALS: Target date: 08/09/23  Pt will be Ind in a final HEP to maintain achieved LOF  Baseline: started Goal status: INITIAL  2.  Increase bilat hamstring to 50d to minimize low back strain Baseline: 40d Goal status: INITIAL  3.  Pt will report improved standing and walking tolerance to 20 mins each for improved function Baseline: Standing 1 min; walking 5 min Goal status: INITIAL  4.  Pt will report a 50% improvement with his R low back and LE pain Baseline: 0-10/10 Goal status: INITIAL  5.  Pt's FOTO score will improved to the predicted value of 58% as indication of improved function  Baseline: 42% Goal status: INITIAL  6.  Increase R leg strength to 4+/5 for improved pelvic stability Baseline: 4/5 Goal status: INITIAL  PLAN:  PT FREQUENCY: 2x/week  PT DURATION: 6 weeks  PLANNED INTERVENTIONS: Therapeutic exercises, Therapeutic activity, Gait training, Patient/Family education, Self Care, Joint mobilization, Aquatic Therapy, Dry Needling, Spinal mobilization, Cryotherapy, Moist heat, Taping, Traction, Ultrasound, Ionotophoresis 4mg /ml Dexamethasone, Manual therapy, and Re-evaluation.  PLAN FOR NEXT SESSION: Review FOTO; assess response to HEP; progress therex as indicated; use of modalities, manual therapy; and TPDN as indicated.  Jannette Spanner, PTA 07/02/23 12:37 PM Phone: [phone] Fax: [phone]

## 2023-07-04 ENCOUNTER — Ambulatory Visit: Payer: Medicare Other | Attending: Cardiology | Admitting: Cardiology

## 2023-07-04 ENCOUNTER — Encounter: Payer: Self-pay | Admitting: Cardiology

## 2023-07-04 VITALS — BP 122/68 | HR 87 | Ht 67.5 in | Wt 169.0 lb

## 2023-07-04 DIAGNOSIS — I10 Essential (primary) hypertension: Secondary | ICD-10-CM | POA: Insufficient documentation

## 2023-07-04 DIAGNOSIS — E7800 Pure hypercholesterolemia, unspecified: Secondary | ICD-10-CM | POA: Insufficient documentation

## 2023-07-04 MED ORDER — POTASSIUM CHLORIDE CRYS ER 20 MEQ PO TBCR
20.0000 meq | EXTENDED_RELEASE_TABLET | Freq: Every day | ORAL | 3 refills | Status: DC
Start: 2023-07-04 — End: 2023-09-14

## 2023-07-04 MED ORDER — POTASSIUM CHLORIDE ER 10 MEQ PO TBCR: 20.0000 meq | EXTENDED_RELEASE_TABLET | Freq: Every day | ORAL | 3 refills | Status: DC

## 2023-07-04 NOTE — Patient Instructions (Signed)
Medication Instructions:  Continue same medications *If you need a refill on your cardiac medications before your next appointment, please call your pharmacy*   Lab Work: None ordered   Testing/Procedures: None ordered   Follow-Up: At [facility], you and your health needs are our priority.  As part of our continuing mission to provide you with exceptional heart care, we have created designated Provider Care Teams.  These Care Teams include your primary Cardiologist (physician) and Advanced Practice Providers (APPs -  Physician Assistants and Nurse Practitioners) who all work together to provide you with the care you need, when you need it.  We recommend signing up for the patient portal called "MyChart".  Sign up information is provided on this After Visit Summary.  MyChart is used to connect with patients for Virtual Visits (Telemedicine).  Patients are able to view lab/test results, encounter notes, upcoming appointments, etc.  Non-urgent messages can be sent to your provider as well.   To learn more about what you can do with MyChart, go to [email or web address].    Your next appointment:  6 months    Call in Jan to schedule March appointment     Provider:  Dr.Jordan

## 2023-07-04 NOTE — Addendum Note (Signed)
Addended by: Neoma Laming on: 07/04/2023 11:34 AM   Modules accepted: Orders

## 2023-07-05 ENCOUNTER — Ambulatory Visit: Payer: Medicare Other

## 2023-07-05 DIAGNOSIS — M6281 Muscle weakness (generalized): Secondary | ICD-10-CM

## 2023-07-05 DIAGNOSIS — R52 Pain, unspecified: Secondary | ICD-10-CM | POA: Diagnosis not present

## 2023-07-05 DIAGNOSIS — G8929 Other chronic pain: Secondary | ICD-10-CM

## 2023-07-05 DIAGNOSIS — R262 Difficulty in walking, not elsewhere classified: Secondary | ICD-10-CM | POA: Diagnosis not present

## 2023-07-05 DIAGNOSIS — M549 Dorsalgia, unspecified: Secondary | ICD-10-CM | POA: Diagnosis not present

## 2023-07-05 DIAGNOSIS — M5441 Lumbago with sciatica, right side: Secondary | ICD-10-CM | POA: Diagnosis not present

## 2023-07-05 NOTE — Therapy (Signed)
OUTPATIENT PHYSICAL THERAPY THORACOLUMBAR TREATMENT    Patient Name: Adrian Davis MRN: [id number] DOB:[birth year], 82 y.o., male Today's Date: 07/05/2023  END OF SESSION:  PT End of Session - 07/05/23 0724     Visit Number 3    Number of Visits 13    Date for PT Re-Evaluation 08/09/23    Authorization Type MEDICARE PART A AND B; BCBS SUPPLEMENT    PT Start Time [phone]    PT Stop Time 0800    PT Time Calculation (min) 43 min    Activity Tolerance Patient tolerated treatment well    Behavior During Therapy [facility] for tasks assessed/performed              Past Medical History:  Diagnosis Date   Abnormal echocardiogram Jan 2012   grade 1 diastolic dysfunction, normal EF, mild LVH   Anemia    Basal cell carcinoma 04/08/2019   left nose inferior to nasal alar groove. EDC: 05/20/2019   Bell palsy 10/2010   presented with facial numbness   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    Degenerative disk disease    GERD (gastroesophageal reflux disease)    History of kidney stones    Hyperlipidemia    Hypertension    dr [name]   Kidney cysts    Lumbar stenosis    Nephrolithiasis    Normal nuclear stress test 2008   Osteoarthritis    Skin cancer    Vocal cord leukoplakia    Dr Lazarus Salines   Past Surgical History:  Procedure Laterality Date   COLONOSCOPY  04/2019   TAx2, hemorrhoids, rpt 17yrs ([place])   ESOPHAGOGASTRODUODENOSCOPY  04/2019   erosive gastropathy, biopsy negative for barrett's ([place])   KNEE ARTHROSCOPY Right    LUMBAR LAMINECTOMY/DECOMPRESSION MICRODISCECTOMY N/A 11/12/2013   Cristi Loron, MD - complicated by urinary retention   MULTIPLE TOOTH EXTRACTIONS     SHOULDER SURGERY Bilateral    TONSILLECTOMY     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   Procedure: TOTAL HIP ARTHROPLASTY ANTERIOR APPROACH;  Surgeon: Ollen Gross, MD;  Location: [facility];  Service: Orthopedics;  Laterality: Right;    TOTAL KNEE ARTHROPLASTY  Left 11/21/2021   Procedure: TOTAL KNEE ARTHROPLASTY;  Surgeon: Ollen Gross, MD;  Location: [facility];  Service: Orthopedics;  Laterality: Left;   Patient Active Problem List   Diagnosis Date Noted   Body aches 02/01/2023   Diarrhea 01/24/2023   Pain of toe of right foot 01/24/2023   Pain of right lower extremity 01/24/2023   Hammer toe of right foot 12/07/2022   Chronic gout 11/17/2022   Tick bites 03/01/2022   Primary osteoarthritis of left knee 11/21/2021   Skin rash 11/11/2021   OA (osteoarthritis) of knee 10/11/2021   DDD (degenerative disc disease), lumbar 10/04/2021   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   Fatigue 03/14/2021   Primary osteoarthritis of right hip 11/10/2020   Pre-op evaluation 11/04/2020   Smokeless tobacco use 11/04/2020   History of urinary retention 11/04/2020   Chronic constipation 01/21/2020   Nephrolithiasis 11/14/2018   Acquired complex renal cyst 11/14/2018   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   Lower abdominal pain 07/24/2018   Unintentional weight loss 07/24/2018   Vitamin B12 deficiency 01/21/2018   Actinic keratosis 01/16/2017   Medicare annual wellness visit, subsequent 01/03/2017   Counseling about travel 01/03/2017   Advanced care planning/counseling discussion 01/03/2017   Bilateral impacted cerumen 07/25/2016   Vocal cord leukoplakia    Overweight (BMI 25.0-29.9)  02/22/2016   Paresthesias 02/22/2016   GERD (gastroesophageal reflux disease)    OA (osteoarthritis) of hip    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   Hyperlipidemia    Essential hypertension     PCP: Eustaquio Boyden, MD  REFERRING PROVIDER: Ronney Asters, NP   REFERRING DIAG: M54.9 (ICD-10-CM) - Back pain, unspecified back location, unspecified back pain laterality, unspecified chronicity   Rationale for Evaluation and Treatment:  Rehabilitation  THERAPY DIAG:  Chronic right-sided low back pain with right-sided sciatica  Muscle weakness (generalized)  Difficulty in walking, not elsewhere classified  ONSET DATE: 1.5 years  SUBJECTIVE:                                                                                                                                                                                           SUBJECTIVE STATEMENT: Pt reports he completed his HEP after working and it helped to ease off his back pain and he slept well last night.   EVAL: Pt reports chronic Hx of R low back and radiating pain down the ant/lat R leg extending to his foot at it's worst. Pt reports he has been told the L5 vertebrae is shifted to the L and the L4 is shifted forward. The pain is significantly aggravated by prolonged standing and walking. Pt notes he can ride working in his tractor for long periods, but he is very stiff with transition to standing. Occasional N/T R LE. Pt states on 2 occasions low back injection helped his pain, but not the last one.  PERTINENT HISTORY:  R THR and L TKR  PAIN:  Are you having pain? Yes: NPRS scale: 2-10/10 pain range on eval. 07/05/23: 3/10 Pain location: R low back, gluteal to R foot Pain description: Ache progresses to sharp Aggravating factors: standing 1 min; walking 5 mins Relieving factors: Sitting, hot, tylenol  PRECAUTIONS: None  RED FLAGS: None   WEIGHT BEARING RESTRICTIONS: No  FALLS:  Has patient fallen in last 6 months? No  LIVING ENVIRONMENT: Lives with: lives with their family Lives in: House/apartment  OCCUPATION: [name]  PLOF: Independent  PATIENT GOALS: decreased pain  NEXT MD VISIT: To be scheduled  Objective information taken on eval unless otherwise indicated:   DIAGNOSTIC FINDINGS:  NA  PATIENT SURVEYS:  FOTO: Perceived function   42%, predicted   58%   SCREENING FOR RED FLAGS: Bowel or bladder incontinence: No Cauda equina  syndrome: No  COGNITION: Overall cognitive status: Within functional limits for tasks assessed  SENSATION: WFL  MUSCLE LENGTH: Hamstrings: Right 40 deg; Left 50 deg Thomas test: Right tight ; Left tight  POSTURE: rounded shoulders, forward head, increased thoracic kyphosis, and flexed trunk   PALPATION: TTP R gluteal area  LUMBAR ROM:   AROM eval  Flexion Full, tight hamstrings  Extension Min  Right lateral flexion Min  Left lateral flexion Min  Right rotation Min   Left rotation Min   (Blank rows = not tested)  LOWER EXTREMITY ROM:     Passive  Right eval Left eval  Hip flexion    Hip extension    Hip abduction    Hip adduction    Hip internal rotation  Tight and painful  Hip external rotation    Knee flexion    Knee extension    Ankle dorsiflexion Weak, equal to L, pt reports Hx of drop foot   Ankle plantarflexion    Ankle inversion    Ankle eversion     (Blank rows = not tested)  LOWER EXTREMITY MMT:    MMT Right eval Left eval  Hip flexion 4 4+  Hip extension 4 4  Hip abduction 4 4+  Hip adduction 4 4+  Hip internal rotation 4 4+  Hip external rotation 4 4+  Knee flexion    Knee extension    Ankle dorsiflexion    Ankle plantarflexion    Ankle inversion    Ankle eversion     (Blank rows = not tested)  LUMBAR SPECIAL TESTS:  Straight leg raise test: Negative, Slump test: Negative, SI Compression/distraction test: Negative, and FABER test: Positive L  FUNCTIONAL TESTS:  NT  GAIT: Distance walked: 200' Assistive device utilized: None Level of assistance: Complete Independence Comments: Decreased DF R>L  TODAY'S TREATMENT:   [facility] Adult PT Treatment:                                                DATE: 07/05/23 Therapeutic Exercise: Nustep L5 UE/LE x 6 minutes  Seated Hamstring stretch x 2 each 30" Seated lumbar flexion 30 sec x 3  LTR x5 5" Mod Thomas stretch x2 30"  PPT x15 3" 90/90 abdominal Bracing x6 10" STS x  15 Plantargrade hip ext x15  Standing hip abd Updated HEP                                                                                                                 [facility] Adult PT Treatment:                                                DATE: 07/02/23 Therapeutic Exercise: Nustep L5 UE/LE x 5 minutes  PPT SKTC LTR Seated Hamstring stretch x 3 each  Seated lumbar flexion 30  sec x 3  STS x 10  Therapeutic Activity: Hip hinge education with pt able to return demo with mod cues     [facility] Adult PT Treatment:                                                DATE: 06/28/23 Therapeutic Exercise: Developed, instructed in, and pt completed therex as noted in HEP  Self Care: To break up prolonged sitting with standing breaks every 30 mins  PATIENT EDUCATION:  Education details: Eval findings, POC, HEP, self care  Person educated: Patient Education method: Explanation, Demonstration, Tactile cues, Verbal cues, and Handouts Education comprehension: verbalized understanding, returned demonstration, verbal cues required, and tactile cues required  HOME EXERCISE PROGRAM: Access Code: [email or web address] URL: [email or web address] Date: 07/05/2023 Prepared by: Joellyn Rued  Exercises - Supine Lower Trunk Rotation  - 2 x daily - 7 x weekly - 1 sets - 10 reps - 5 hold - Supine Posterior Pelvic Tilt  - 2 x daily - 7 x weekly - 1 sets - 10 reps - 3 hold - Hooklying Single Knee to Chest  - 1 x daily - 7 x weekly - 1 sets - 4 reps - 30 hold - Supine Double Knee to Chest  - 1 x daily - 7 x weekly - 1 sets - 3 reps - 30 hold - Modified Thomas Stretch  - 1 x daily - 7 x weekly - 1 sets - 10 reps - Seated Hamstring Stretch  - 1 x daily - 7 x weekly - 1 sets - 3 reps - 30 hold - Seated Flexion Stretch  - 1 x daily - 7 x weekly - 1 sets - 3 reps - 15-30 hold - Sit to stand with control  - 1 x daily - 7 x weekly - 2 sets - 10 reps  ASSESSMENT:  CLINICAL IMPRESSION: PT was completed for  lumbopelvic flexibility and strengthening. A hip flexor stretch was added to his HEP. Pt is completing hinged hip sit to standing properly. Pt tolerated the prescribed therex without adverse effects. . Pt will continue to benefit from skilled PT to address impairments for improved function with minimized pain.  EVAL: Patient is a 82 y.o. male who was seen today for physical therapy evaluation and treatment for M54.9 (ICD-10-CM) - Back pain, unspecified back location, unspecified back pain laterality, unspecified chronicity .   OBJECTIVE IMPAIRMENTS: decreased activity tolerance, difficulty walking, decreased ROM, decreased strength, impaired flexibility, and pain.   ACTIVITY LIMITATIONS: lifting, standing, and locomotion level  PARTICIPATION LIMITATIONS: occupation  PERSONAL FACTORS: Age, Past/current experiences, and Time since onset of injury/illness/exacerbation are also affecting patient's functional outcome.   REHAB POTENTIAL: Good  CLINICAL DECISION MAKING: Stable/uncomplicated  EVALUATION COMPLEXITY: Low   GOALS:  SHORT TERM GOALS= LTGs   LONG TERM GOALS: Target date: 08/09/23  Pt will be Ind in a final HEP to maintain achieved LOF  Baseline: started Goal status: INITIAL  2.  Increase bilat hamstring to 50d to minimize low back strain Baseline: 40d Goal status: INITIAL  3.  Pt will report improved standing and walking tolerance to 20 mins each for improved function Baseline: Standing 1 min; walking 5 min Goal status: INITIAL  4.  Pt will report a 50% improvement with his R low back and LE pain Baseline: 0-10/10 Goal status: INITIAL  5.  Pt's FOTO score will improved to the predicted value of 58% as indication of improved function  Baseline: 42% Goal status: INITIAL  6.  Increase R leg strength to 4+/5 for improved pelvic stability Baseline: 4/5 Goal status: INITIAL  PLAN:  PT FREQUENCY: 2x/week  PT DURATION: 6 weeks  PLANNED INTERVENTIONS: Therapeutic  exercises, Therapeutic activity, Gait training, Patient/Family education, Self Care, Joint mobilization, Aquatic Therapy, Dry Needling, Spinal mobilization, Cryotherapy, Moist heat, Taping, Traction, Ultrasound, Ionotophoresis 4mg /ml Dexamethasone, Manual therapy, and Re-evaluation.  PLAN FOR NEXT SESSION: Review FOTO; assess response to HEP; progress therex as indicated; use of modalities, manual therapy; and TPDN as indicated.  Shelisa Fern MS, PT 07/05/23 8:00 AM

## 2023-07-09 ENCOUNTER — Encounter: Payer: Self-pay | Admitting: Physical Therapy

## 2023-07-09 ENCOUNTER — Ambulatory Visit: Payer: Medicare Other | Admitting: Physical Therapy

## 2023-07-09 DIAGNOSIS — M549 Dorsalgia, unspecified: Secondary | ICD-10-CM | POA: Diagnosis not present

## 2023-07-09 DIAGNOSIS — G8929 Other chronic pain: Secondary | ICD-10-CM

## 2023-07-09 DIAGNOSIS — M6281 Muscle weakness (generalized): Secondary | ICD-10-CM | POA: Diagnosis not present

## 2023-07-09 DIAGNOSIS — R262 Difficulty in walking, not elsewhere classified: Secondary | ICD-10-CM | POA: Diagnosis not present

## 2023-07-09 DIAGNOSIS — M5441 Lumbago with sciatica, right side: Secondary | ICD-10-CM | POA: Diagnosis not present

## 2023-07-09 DIAGNOSIS — R52 Pain, unspecified: Secondary | ICD-10-CM | POA: Diagnosis not present

## 2023-07-09 NOTE — Therapy (Signed)
OUTPATIENT PHYSICAL THERAPY THORACOLUMBAR TREATMENT    Patient Name: Adrian Davis MRN: [id number] DOB:03-24-1941, 82 y.o., male Today's Date: 07/09/2023  END OF SESSION:  PT End of Session - 07/09/23 0718     Visit Number 4    Number of Visits 13    Date for PT Re-Evaluation 08/09/23    Authorization Type MEDICARE PART A AND B; BCBS SUPPLEMENT    PT Start Time 0717    PT Stop Time 0755    PT Time Calculation (min) 38 min              Past Medical History:  Diagnosis Date   Abnormal echocardiogram Jan 2012   grade 1 diastolic dysfunction, normal EF, mild LVH   Anemia    Basal cell carcinoma 04/08/2019   left nose inferior to nasal alar groove. EDC: 05/20/2019   Bell palsy 10/2010   presented with facial numbness   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    Degenerative disk disease    GERD (gastroesophageal reflux disease)    History of kidney stones    Hyperlipidemia    Hypertension    dr [name]   Kidney cysts    Lumbar stenosis    Nephrolithiasis    Normal nuclear stress test 2008   Osteoarthritis    Skin cancer    Vocal cord leukoplakia    Dr Lazarus Salines   Past Surgical History:  Procedure Laterality Date   COLONOSCOPY  04/2019   TAx2, hemorrhoids, rpt 68yrs ([place])   ESOPHAGOGASTRODUODENOSCOPY  04/2019   erosive gastropathy, biopsy negative for barrett's ([place])   KNEE ARTHROSCOPY Right    LUMBAR LAMINECTOMY/DECOMPRESSION MICRODISCECTOMY N/A 11/12/2013   Cristi Loron, MD - complicated by urinary retention   MULTIPLE TOOTH EXTRACTIONS     SHOULDER SURGERY Bilateral    TONSILLECTOMY     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   Procedure: TOTAL HIP ARTHROPLASTY ANTERIOR APPROACH;  Surgeon: Ollen Gross, MD;  Location: [facility];  Service: Orthopedics;  Laterality: Right;    TOTAL KNEE ARTHROPLASTY Left 11/21/2021   Procedure: TOTAL KNEE ARTHROPLASTY;  Surgeon: Ollen Gross, MD;  Location: [facility];  Service:  Orthopedics;  Laterality: Left;   Patient Active Problem List   Diagnosis Date Noted   Body aches 02/01/2023   Diarrhea 01/24/2023   Pain of toe of right foot 01/24/2023   Pain of right lower extremity 01/24/2023   Hammer toe of right foot 12/07/2022   Chronic gout 11/17/2022   Tick bites 03/01/2022   Primary osteoarthritis of left knee 11/21/2021   Skin rash 11/11/2021   OA (osteoarthritis) of knee 10/11/2021   DDD (degenerative disc disease), lumbar 10/04/2021   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   Fatigue 03/14/2021   Primary osteoarthritis of right hip 11/10/2020   Pre-op evaluation 11/04/2020   Smokeless tobacco use 11/04/2020   History of urinary retention 11/04/2020   Chronic constipation 01/21/2020   Nephrolithiasis 11/14/2018   Acquired complex renal cyst 11/14/2018   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   Lower abdominal pain 07/24/2018   Unintentional weight loss 07/24/2018   Vitamin B12 deficiency 01/21/2018   Actinic keratosis 01/16/2017   Medicare annual wellness visit, subsequent 01/03/2017   Counseling about travel 01/03/2017   Advanced care planning/counseling discussion 01/03/2017   Bilateral impacted cerumen 07/25/2016   Vocal cord leukoplakia    Overweight (BMI 25.0-29.9) 02/22/2016   Paresthesias 02/22/2016   GERD (gastroesophageal reflux disease)    OA (osteoarthritis) of hip  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   Hyperlipidemia    Essential hypertension     PCP: Eustaquio Boyden, MD  REFERRING PROVIDER: Ronney Asters, NP   REFERRING DIAG: M54.9 (ICD-10-CM) - Back pain, unspecified back location, unspecified back pain laterality, unspecified chronicity   Rationale for Evaluation and Treatment: Rehabilitation  THERAPY DIAG:  Chronic right-sided low back pain with right-sided sciatica  Muscle weakness  (generalized)  Difficulty in walking, not elsewhere classified  ONSET DATE: 1.5 years  SUBJECTIVE:                                                                                                                                                                                           SUBJECTIVE STATEMENT: Pt reports he has had increased pain since last Thursday. He is unsure if it was PT exercises or standing on concrete floor in his shop.   EVAL: Pt reports chronic Hx of R low back and radiating pain down the ant/lat R leg extending to his foot at it's worst. Pt reports he has been told the L5 vertebrae is shifted to the L and the L4 is shifted forward. The pain is significantly aggravated by prolonged standing and walking. Pt notes he can ride working in his tractor for long periods, but he is very stiff with transition to standing. Occasional N/T R LE. Pt states on 2 occasions low back injection helped his pain, but not the last one.  PERTINENT HISTORY:  R THR and L TKR  PAIN:  Are you having pain? Yes: NPRS scale: 2-10/10 pain range on eval. 07/09/23: 6/10 Pain location: R low back, gluteal to R foot Pain description: Ache progresses to sharp Aggravating factors: standing 1 min; walking 5 mins Relieving factors: Sitting, hot, tylenol  PRECAUTIONS: None  RED FLAGS: None   WEIGHT BEARING RESTRICTIONS: No  FALLS:  Has patient fallen in last 6 months? No  LIVING ENVIRONMENT: Lives with: lives with their family Lives in: House/apartment  OCCUPATION: [name]  PLOF: Independent  PATIENT GOALS: decreased pain  NEXT MD VISIT: To be scheduled  Objective information taken on eval unless otherwise indicated:   DIAGNOSTIC FINDINGS:  NA  PATIENT SURVEYS:  FOTO: Perceived function   42%, predicted   58%   SCREENING FOR RED FLAGS: Bowel or bladder incontinence: No Cauda equina syndrome: No  COGNITION: Overall cognitive status: Within functional limits for tasks  assessed     SENSATION: [facility]  MUSCLE LENGTH: Hamstrings: Right 40 deg; Left 50 deg: 07/09/23: Left 65 Right 65  Thomas test: Right tight ; Left tight  POSTURE: rounded shoulders, forward head, increased thoracic kyphosis, and flexed trunk   PALPATION: TTP R gluteal area  LUMBAR ROM:   AROM eval  Flexion Full, tight hamstrings  Extension Min  Right lateral flexion Min  Left lateral flexion Min  Right rotation Min   Left rotation Min   (Blank rows = not tested)  LOWER EXTREMITY ROM:     Passive  Right eval Left eval  Hip flexion    Hip extension    Hip abduction    Hip adduction    Hip internal rotation  Tight and painful  Hip external rotation    Knee flexion    Knee extension    Ankle dorsiflexion Weak, equal to L, pt reports Hx of drop foot   Ankle plantarflexion    Ankle inversion    Ankle eversion     (Blank rows = not tested)  LOWER EXTREMITY MMT:    MMT Right eval Left eval  Hip flexion 4 4+  Hip extension 4 4  Hip abduction 4 4+  Hip adduction 4 4+  Hip internal rotation 4 4+  Hip external rotation 4 4+  Knee flexion    Knee extension    Ankle dorsiflexion    Ankle plantarflexion    Ankle inversion    Ankle eversion     (Blank rows = not tested)  LUMBAR SPECIAL TESTS:  Straight leg raise test: Negative, Slump test: Negative, SI Compression/distraction test: Negative, and FABER test: Positive L  FUNCTIONAL TESTS:  NT  GAIT: Distance walked: 200' Assistive device utilized: None Level of assistance: Complete Independence Comments: Decreased DF R>L  TODAY'S TREATMENT:   [facility] Adult PT Treatment:                                                DATE: 07/09/23 Therapeutic Exercise: Nustep L5  Standing hip abdct 10 x 3  Forearms on counter hip ext 10 x 2 each.  STS 10 x 2  Seated Lumbar flexion with ball 10 sec x 4  PPT x 10 90/90 abdominal Bracing x6 10"  LTR SKTC x 3 each  DKTC x 3  Hip flexor stretch EOM- cues to perform gently and  avoid back pain  Figure 4 push and pull bilateral  Supine hamstring stretch 2 x 30 sec x 2 each     [facility] Adult PT Treatment:                                                DATE: 07/05/23 Therapeutic Exercise: Nustep L5 UE/LE x 6 minutes  Seated Hamstring stretch x 2 each 30" Seated lumbar flexion 30 sec x 3  LTR x5 5" Mod Thomas stretch x2 30"  PPT x15 3" 90/90 abdominal Bracing x6 10" STS x 15 Plantargrade hip ext x15  Standing hip abd Updated HEP  [facility] Adult PT Treatment:                                                DATE: 07/02/23 Therapeutic Exercise: Nustep L5 UE/LE x 5 minutes  PPT SKTC LTR Seated Hamstring stretch x 3 each  Seated lumbar flexion 30 sec x 3  STS x 10  Therapeutic Activity: Hip hinge education with pt able to return demo with mod cues     [facility] Adult PT Treatment:                                                DATE: 06/28/23 Therapeutic Exercise: Developed, instructed in, and pt completed therex as noted in HEP  Self Care: To break up prolonged sitting with standing breaks every 30 mins  PATIENT EDUCATION:  Education details: Eval findings, POC, HEP, self care  Person educated: Patient Education method: Explanation, Demonstration, Tactile cues, Verbal cues, and Handouts Education comprehension: verbalized understanding, returned demonstration, verbal cues required, and tactile cues required  HOME EXERCISE PROGRAM: Access Code: [email or web address] URL: [email or web address] Date: 07/05/2023 Prepared by: Joellyn Rued  Exercises - Supine Lower Trunk Rotation  - 2 x daily - 7 x weekly - 1 sets - 10 reps - 5 hold - Supine Posterior Pelvic Tilt  - 2 x daily - 7 x weekly - 1 sets - 10 reps - 3 hold - Hooklying Single Knee to Chest  - 1 x daily - 7 x weekly - 1 sets - 4 reps - 30 hold - Supine Double Knee to Chest  - 1 x daily - 7 x weekly  - 1 sets - 3 reps - 30 hold - Modified Thomas Stretch  - 1 x daily - 7 x weekly - 1 sets - 10 reps - Seated Hamstring Stretch  - 1 x daily - 7 x weekly - 1 sets - 3 reps - 30 hold - Seated Flexion Stretch  - 1 x daily - 7 x weekly - 1 sets - 3 reps - 15-30 hold - Sit to stand with control  - 1 x daily - 7 x weekly - 2 sets - 10 reps  ASSESSMENT:  CLINICAL IMPRESSION: PT was completed for lumbopelvic flexibility and strengthening. Pt reports increased pain since last week however thinks it could have been standing prolonged on concrete floors. Reviewed hip flexor stretch with cues to avoid straining lumbar spine.  Pt tolerated the prescribed therex without adverse effects. Hamstring length has improved bilateral. Pt will continue to benefit from skilled PT to address impairments for improved function with minimized pain.   EVAL: Patient is a 82 y.o. male who was seen today for physical therapy evaluation and treatment for M54.9 (ICD-10-CM) - Back pain, unspecified back location, unspecified back pain laterality, unspecified chronicity .   OBJECTIVE IMPAIRMENTS: decreased activity tolerance, difficulty walking, decreased ROM, decreased strength, impaired flexibility, and pain.   ACTIVITY LIMITATIONS: lifting, standing, and locomotion level  PARTICIPATION LIMITATIONS: occupation  PERSONAL FACTORS: Age, Past/current experiences, and Time since onset of injury/illness/exacerbation are also affecting patient's functional outcome.   REHAB POTENTIAL: Good  CLINICAL DECISION MAKING: Stable/uncomplicated  EVALUATION COMPLEXITY: Low   GOALS:  SHORT TERM GOALS= LTGs   LONG TERM GOALS:  Target date: 08/09/23  Pt will be Ind in a final HEP to maintain achieved LOF  Baseline: started Goal status: INITIAL  2.  Increase bilat hamstring to 50d to minimize low back strain Baseline: 40d Goal status: INITIAL  3.  Pt will report improved standing and walking tolerance to 20 mins each for improved  function Baseline: Standing 1 min; walking 5 min Goal status: INITIAL  4.  Pt will report a 50% improvement with his R low back and LE pain Baseline: 0-10/10 Goal status: INITIAL  5.  Pt's FOTO score will improved to the predicted value of 58% as indication of improved function  Baseline: 42% Goal status: INITIAL  6.  Increase R leg strength to 4+/5 for improved pelvic stability Baseline: 4/5 Goal status: INITIAL  PLAN:  PT FREQUENCY: 2x/week  PT DURATION: 6 weeks  PLANNED INTERVENTIONS: Therapeutic exercises, Therapeutic activity, Gait training, Patient/Family education, Self Care, Joint mobilization, Aquatic Therapy, Dry Needling, Spinal mobilization, Cryotherapy, Moist heat, Taping, Traction, Ultrasound, Ionotophoresis 4mg /ml Dexamethasone, Manual therapy, and Re-evaluation.  PLAN FOR NEXT SESSION: Review FOTO; assess response to HEP; progress therex as indicated; use of modalities, manual therapy; and TPDN as indicated.  Jannette Spanner, PTA 07/09/23 8:01 AM Phone: [phone] Fax: [phone]

## 2023-07-10 NOTE — Therapy (Addendum)
OUTPATIENT PHYSICAL THERAPY THORACOLUMBAR TREATMENT    Patient Name: Adrian Davis MRN: [id number] DOB:12-11-1940, 82 y.o., male Today's Date: 07/11/2023  END OF SESSION:  PT End of Session - 07/11/23 0955     Visit Number 5    Number of Visits 13    Date for PT Re-Evaluation 08/09/23    Authorization Type MEDICARE PART A AND B; BCBS SUPPLEMENT    PT Start Time 0720    PT Stop Time 0800    PT Time Calculation (min) 40 min    Activity Tolerance Patient tolerated treatment well    Behavior During Therapy [facility] for tasks assessed/performed               Past Medical History:  Diagnosis Date   Abnormal echocardiogram Jan 2012   grade 1 diastolic dysfunction, normal EF, mild LVH   Anemia    Basal cell carcinoma 04/08/2019   left nose inferior to nasal alar groove. EDC: 05/20/2019   Bell palsy 10/2010   presented with facial numbness   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    Degenerative disk disease    GERD (gastroesophageal reflux disease)    History of kidney stones    Hyperlipidemia    Hypertension    dr [name]   Kidney cysts    Lumbar stenosis    Nephrolithiasis    Normal nuclear stress test 2008   Osteoarthritis    Skin cancer    Vocal cord leukoplakia    Dr Lazarus Salines   Past Surgical History:  Procedure Laterality Date   COLONOSCOPY  04/2019   TAx2, hemorrhoids, rpt 4yrs ([place])   ESOPHAGOGASTRODUODENOSCOPY  04/2019   erosive gastropathy, biopsy negative for barrett's ([place])   KNEE ARTHROSCOPY Right    LUMBAR LAMINECTOMY/DECOMPRESSION MICRODISCECTOMY N/A 11/12/2013   Cristi Loron, MD - complicated by urinary retention   MULTIPLE TOOTH EXTRACTIONS     SHOULDER SURGERY Bilateral    TONSILLECTOMY     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   Procedure: TOTAL HIP ARTHROPLASTY ANTERIOR APPROACH;  Surgeon: Ollen Gross, MD;  Location: [facility];  Service: Orthopedics;  Laterality: Right;    TOTAL KNEE ARTHROPLASTY  Left 11/21/2021   Procedure: TOTAL KNEE ARTHROPLASTY;  Surgeon: Ollen Gross, MD;  Location: [facility];  Service: Orthopedics;  Laterality: Left;   Patient Active Problem List   Diagnosis Date Noted   Body aches 02/01/2023   Diarrhea 01/24/2023   Pain of toe of right foot 01/24/2023   Pain of right lower extremity 01/24/2023   Hammer toe of right foot 12/07/2022   Chronic gout 11/17/2022   Tick bites 03/01/2022   Primary osteoarthritis of left knee 11/21/2021   Skin rash 11/11/2021   OA (osteoarthritis) of knee 10/11/2021   DDD (degenerative disc disease), lumbar 10/04/2021   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   Fatigue 03/14/2021   Primary osteoarthritis of right hip 11/10/2020   Pre-op evaluation 11/04/2020   Smokeless tobacco use 11/04/2020   History of urinary retention 11/04/2020   Chronic constipation 01/21/2020   Nephrolithiasis 11/14/2018   Acquired complex renal cyst 11/14/2018   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   Lower abdominal pain 07/24/2018   Unintentional weight loss 07/24/2018   Vitamin B12 deficiency 01/21/2018   Actinic keratosis 01/16/2017   Medicare annual wellness visit, subsequent 01/03/2017   Counseling about travel 01/03/2017   Advanced care planning/counseling discussion 01/03/2017   Bilateral impacted cerumen 07/25/2016   Vocal cord leukoplakia    Overweight (BMI  25.0-29.9) 02/22/2016   Paresthesias 02/22/2016   GERD (gastroesophageal reflux disease)    OA (osteoarthritis) of hip    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   Hyperlipidemia    Essential hypertension     PCP: Eustaquio Boyden, MD  REFERRING PROVIDER: Ronney Asters, NP   REFERRING DIAG: M54.9 (ICD-10-CM) - Back pain, unspecified back location, unspecified back pain laterality, unspecified chronicity   Rationale for Evaluation and Treatment:  Rehabilitation  THERAPY DIAG:  Chronic right-sided low back pain with right-sided sciatica  Muscle weakness (generalized)  Difficulty in walking, not elsewhere classified  ONSET DATE: 1.5 years  SUBJECTIVE:                                                                                                                                                                                           SUBJECTIVE STATEMENT: Pt reports R leg pain is better, but his low back is bothering him more. I'm walking better. Pt reportshe is to have injection for his low back on 07/16/23.  EVAL: Pt reports chronic Hx of R low back and radiating pain down the ant/lat R leg extending to his foot at it's worst. Pt reports he has been told the L5 vertebrae is shifted to the L and the L4 is shifted forward. The pain is significantly aggravated by prolonged standing and walking. Pt notes he can ride working in his tractor for long periods, but he is very stiff with transition to standing. Occasional N/T R LE. Pt states on 2 occasions low back injection helped his pain, but not the last one.  PERTINENT HISTORY:  R THR and L TKR  PAIN:  Are you having pain? Yes: NPRS scale: 2-10/10 pain range on eval. 07/11/23: 6/10 Pain location: R low back, gluteal to R foot Pain description: Ache progresses to sharp Aggravating factors: standing 1 min; walking 5 mins Relieving factors: Sitting, hot, tylenol  PRECAUTIONS: None  RED FLAGS: None   WEIGHT BEARING RESTRICTIONS: No  FALLS:  Has patient fallen in last 6 months? No  LIVING ENVIRONMENT: Lives with: lives with their family Lives in: House/apartment  OCCUPATION: [name]  PLOF: Independent  PATIENT GOALS: decreased pain  NEXT MD VISIT: To be scheduled  Objective information taken on eval unless otherwise indicated:   DIAGNOSTIC FINDINGS:  NA  PATIENT SURVEYS:  FOTO: Perceived function   42%, predicted   58%   SCREENING FOR RED FLAGS: Bowel or bladder  incontinence: No Cauda equina syndrome: No  COGNITION: Overall cognitive status: Within functional  limits for tasks assessed     SENSATION: [facility]  MUSCLE LENGTH: Hamstrings: Right 40 deg; Left 50 deg: 07/09/23: Left 65 Right 65 Thomas test: Right tight ; Left tight  POSTURE: rounded shoulders, forward head, increased thoracic kyphosis, and flexed trunk   PALPATION: TTP R gluteal area  LUMBAR ROM:   AROM eval  Flexion Full, tight hamstrings  Extension Min  Right lateral flexion Min  Left lateral flexion Min  Right rotation Min   Left rotation Min   (Blank rows = not tested)  LOWER EXTREMITY ROM:     Passive  Right eval Left eval  Hip flexion    Hip extension    Hip abduction    Hip adduction    Hip internal rotation  Tight and painful  Hip external rotation    Knee flexion    Knee extension    Ankle dorsiflexion Weak, equal to L, pt reports Hx of drop foot   Ankle plantarflexion    Ankle inversion    Ankle eversion     (Blank rows = not tested)  LOWER EXTREMITY MMT:    MMT Right eval Left eval  Hip flexion 4 4+  Hip extension 4 4  Hip abduction 4 4+  Hip adduction 4 4+  Hip internal rotation 4 4+  Hip external rotation 4 4+  Knee flexion    Knee extension    Ankle dorsiflexion    Ankle plantarflexion    Ankle inversion    Ankle eversion     (Blank rows = not tested)  LUMBAR SPECIAL TESTS:  Straight leg raise test: Negative, Slump test: Negative, SI Compression/distraction test: Negative, and FABER test: Positive L  FUNCTIONAL TESTS:  NT  GAIT: Distance walked: 200' Assistive device utilized: None Level of assistance: Complete Independence Comments: Decreased DF R>L  TODAY'S TREATMENT:   [facility] Adult PT Treatment:                                                DATE: 07/11/23 Therapeutic Exercise: Nustep L5 4 mins UE/LE LTR SKTC x 1 each  DKTC x 1  Hip flexor stretch EOM- cues to perform gently and avoid back pain  Figure 4 push and pull  bilateral 1x each Supine hamstring stretch 1 x 30 sec each PPT x 10 90/90 abdominal Bracing x10 10" Seated forward trunk flexion Forearms on counter hip ext 10 x 2 each.  STS 10 x 2  Shoulder rows, shoulder ext, palloff press c GTB x15 each  [facility] Adult PT Treatment:                                                DATE: 07/09/23 Therapeutic Exercise: Nustep L5  Standing hip abdct 10 x 3  Forearms on counter hip ext 10 x 2 each.  STS 10 x 2  Seated Lumbar flexion with ball 10 sec x 4  PPT x 10 90/90 abdominal Bracing x6 10"  LTR SKTC x 3 each  DKTC x 3  Hip flexor stretch EOM- cues to perform gently and avoid back pain  Figure 4 push and pull bilateral  Supine hamstring stretch 2 x 30 sec x 2 each     [facility] Adult  PT Treatment:                                                DATE: 07/05/23 Therapeutic Exercise: Nustep L5 UE/LE x 6 minutes  Seated Hamstring stretch x 2 each 30" Seated lumbar flexion 30 sec x 3  LTR x5 5" Mod Thomas stretch x2 30"  PPT x15 3" 90/90 abdominal Bracing x6 10" STS x 15 Plantargrade hip ext x15  Standing hip abd Updated HEP                                                                                                                 [facility] Adult PT Treatment:                                                DATE: 07/02/23 Therapeutic Exercise: Nustep L5 UE/LE x 5 minutes  PPT SKTC LTR Seated Hamstring stretch x 3 each  Seated lumbar flexion 30 sec x 3  STS x 10  Therapeutic Activity: Hip hinge education with pt able to return demo with mod cues     [facility] Adult PT Treatment:                                                DATE: 06/28/23 Therapeutic Exercise: Developed, instructed in, and pt completed therex as noted in HEP  Self Care: To break up prolonged sitting with standing breaks every 30 mins  PATIENT EDUCATION:  Education details: Eval findings, POC, HEP, self care  Person educated: Patient Education method: Explanation, Demonstration,  Tactile cues, Verbal cues, and Handouts Education comprehension: verbalized understanding, returned demonstration, verbal cues required, and tactile cues required  HOME EXERCISE PROGRAM: Access Code: [email or web address] URL: [email or web address] Date: 07/11/2023 Prepared by: Joellyn Rued  Exercises - Supine Posterior Pelvic Tilt  - 2 x daily - 7 x weekly - 1 sets - 10 reps - 3 hold - Sit to stand with control  - 1 x daily - 7 x weekly - 2 sets - 10 reps - Prone Hip Extension on Table  - 1 x daily - 7 x weekly - 2 sets - 10 reps - 3 hold - Supine Lower Trunk Rotation  - 2 x daily - 7 x weekly - 1 sets - 10 reps - 5 hold - Hooklying Single Knee to Chest  - 1 x daily - 7 x weekly - 1 sets - 4 reps - 30 hold - Supine Double Knee to Chest  - 1 x daily - 7 x weekly - 1 sets - 3 reps - 30 hold - Erby Pian on Table  -  1 x daily - 7 x weekly - 1 sets - 3 reps - 30 hold - Hooklying Hamstring Stretch with Strap  - 1 x daily - 7 x weekly - 1 sets - 3 reps - 30 hold - Seated Flexion Stretch  - 1 x daily - 7 x weekly - 1 sets - 3 reps - 15-30 hold - Supine Piriformis Stretch with Foot on Ground  - 1 x daily - 7 x weekly - 3 sets - 10 reps - Supine Figure 4 Piriformis Stretch  - 1 x daily - 7 x weekly - 3 sets - 10 reps  ASSESSMENT:  CLINICAL IMPRESSION: PT was continued for lumbopelvic flexibility and strengthening. Pt has been provided several flexibility therex and all seem to be beneficial. Pt was advised to alternate completion to minimize length of HEP. Pt's centralization of R leg pain appears to be a positive sign. Discussed possible use of TPDN for addressing low back pain to which the pt was receptive. Pt tolerated the prescribed therex without adverse effects. Pt will continue to benefit from skilled PT to address impairments for improved function with minimized pain.   EVAL: Patient is a 82 y.o. male who was seen today for physical therapy evaluation and treatment for M54.9  (ICD-10-CM) - Back pain, unspecified back location, unspecified back pain laterality, unspecified chronicity .   OBJECTIVE IMPAIRMENTS: decreased activity tolerance, difficulty walking, decreased ROM, decreased strength, impaired flexibility, and pain.   ACTIVITY LIMITATIONS: lifting, standing, and locomotion level  PARTICIPATION LIMITATIONS: occupation  PERSONAL FACTORS: Age, Past/current experiences, and Time since onset of injury/illness/exacerbation are also affecting patient's functional outcome.   REHAB POTENTIAL: Good  CLINICAL DECISION MAKING: Stable/uncomplicated  EVALUATION COMPLEXITY: Low   GOALS:  SHORT TERM GOALS= LTGs   LONG TERM GOALS: Target date: 08/09/23  Pt will be Ind in a final HEP to maintain achieved LOF  Baseline: started Goal status: Ongoing  2.  Increase bilat hamstring to 50d to minimize low back strain Baseline: 40d Goal status: INITIAL  3.  Pt will report improved standing and walking tolerance to 20 mins each for improved function Baseline: Standing 1 min; walking 5 min Goal status: INITIAL  4.  Pt will report a 50% improvement with his R low back and LE pain Baseline: 0-10/10 Goal status: INITIAL  5.  Pt's FOTO score will improved to the predicted value of 58% as indication of improved function  Baseline: 42% Goal status: INITIAL  6.  Increase R leg strength to 4+/5 for improved pelvic stability Baseline: 4/5 Goal status: INITIAL  PLAN:  PT FREQUENCY: 2x/week  PT DURATION: 6 weeks  PLANNED INTERVENTIONS: Therapeutic exercises, Therapeutic activity, Gait training, Patient/Family education, Self Care, Joint mobilization, Aquatic Therapy, Dry Needling, Spinal mobilization, Cryotherapy, Moist heat, Taping, Traction, Ultrasound, Ionotophoresis 4mg /ml Dexamethasone, Manual therapy, and Re-evaluation.  PLAN FOR NEXT SESSION: Review FOTO; assess response to HEP; progress therex as indicated; use of modalities, manual therapy; and TPDN as  indicated.  Matasha Smigelski MS, PT 07/11/23 9:57 AM  PHYSICAL THERAPY DISCHARGE SUMMARY  Visits from Start of Care: 5  Current functional level related to goals / functional outcomes: unknown   Remaining deficits: unknown   Education / Equipment: HEP, Pt Ed   Patient agrees to discharge. Patient goals were not met. Patient is being discharged due to not returning since the last visit.  Kwali Wrinkle MS, PT 11/16/23 11:19 AM

## 2023-07-11 ENCOUNTER — Ambulatory Visit: Payer: Medicare Other

## 2023-07-11 DIAGNOSIS — M6281 Muscle weakness (generalized): Secondary | ICD-10-CM | POA: Diagnosis not present

## 2023-07-11 DIAGNOSIS — G8929 Other chronic pain: Secondary | ICD-10-CM

## 2023-07-11 DIAGNOSIS — R262 Difficulty in walking, not elsewhere classified: Secondary | ICD-10-CM

## 2023-07-11 DIAGNOSIS — M549 Dorsalgia, unspecified: Secondary | ICD-10-CM | POA: Diagnosis not present

## 2023-07-11 DIAGNOSIS — M5441 Lumbago with sciatica, right side: Secondary | ICD-10-CM | POA: Diagnosis not present

## 2023-07-11 DIAGNOSIS — R52 Pain, unspecified: Secondary | ICD-10-CM | POA: Diagnosis not present

## 2023-07-16 ENCOUNTER — Ambulatory Visit: Payer: Medicare Other | Admitting: Physical Therapy

## 2023-07-16 DIAGNOSIS — M48062 Spinal stenosis, lumbar region with neurogenic claudication: Secondary | ICD-10-CM | POA: Diagnosis not present

## 2023-07-18 ENCOUNTER — Ambulatory Visit: Payer: Medicare Other

## 2023-07-23 ENCOUNTER — Ambulatory Visit: Payer: Medicare Other | Admitting: Physical Therapy

## 2023-07-25 ENCOUNTER — Ambulatory Visit: Payer: Medicare Other

## 2023-07-30 ENCOUNTER — Ambulatory Visit: Payer: Medicare Other | Admitting: Physical Therapy

## 2023-08-01 ENCOUNTER — Encounter: Payer: Medicare Other | Admitting: Physical Therapy

## 2023-08-01 ENCOUNTER — Ambulatory Visit: Payer: Medicare Other | Admitting: Physical Therapy

## 2023-08-07 ENCOUNTER — Other Ambulatory Visit: Payer: Self-pay | Admitting: Cardiology

## 2023-08-20 ENCOUNTER — Inpatient Hospital Stay (HOSPITAL_COMMUNITY)
Admission: EM | Admit: 2023-08-20 | Discharge: 2023-08-23 | DRG: 372 | Disposition: A | Discharge status: Home / Self Care | Payer: Medicare Other | Attending: Internal Medicine | Admitting: Internal Medicine

## 2023-08-20 ENCOUNTER — Other Ambulatory Visit: Payer: Self-pay

## 2023-08-20 ENCOUNTER — Emergency Department (HOSPITAL_COMMUNITY): Payer: Medicare Other

## 2023-08-20 ENCOUNTER — Encounter (HOSPITAL_COMMUNITY): Payer: Self-pay

## 2023-08-20 DIAGNOSIS — Z85828 Personal history of other malignant neoplasm of skin: Secondary | ICD-10-CM

## 2023-08-20 DIAGNOSIS — I7143 Infrarenal abdominal aortic aneurysm, without rupture: Secondary | ICD-10-CM | POA: Diagnosis present

## 2023-08-20 DIAGNOSIS — G8929 Other chronic pain: Secondary | ICD-10-CM | POA: Diagnosis not present

## 2023-08-20 DIAGNOSIS — M109 Gout, unspecified: Secondary | ICD-10-CM | POA: Diagnosis present

## 2023-08-20 DIAGNOSIS — R55 Syncope and collapse: Secondary | ICD-10-CM | POA: Diagnosis not present

## 2023-08-20 DIAGNOSIS — Z79899 Other long term (current) drug therapy: Secondary | ICD-10-CM

## 2023-08-20 DIAGNOSIS — K649 Unspecified hemorrhoids: Secondary | ICD-10-CM | POA: Diagnosis present

## 2023-08-20 DIAGNOSIS — Z7982 Long term (current) use of aspirin: Secondary | ICD-10-CM | POA: Diagnosis not present

## 2023-08-20 DIAGNOSIS — Z23 Encounter for immunization: Secondary | ICD-10-CM

## 2023-08-20 DIAGNOSIS — K922 Gastrointestinal hemorrhage, unspecified: Secondary | ICD-10-CM | POA: Diagnosis not present

## 2023-08-20 DIAGNOSIS — Z87442 Personal history of urinary calculi: Secondary | ICD-10-CM

## 2023-08-20 DIAGNOSIS — G894 Chronic pain syndrome: Secondary | ICD-10-CM | POA: Diagnosis present

## 2023-08-20 DIAGNOSIS — K51511 Left sided colitis with rectal bleeding: Secondary | ICD-10-CM | POA: Diagnosis present

## 2023-08-20 DIAGNOSIS — R42 Dizziness and giddiness: Secondary | ICD-10-CM | POA: Diagnosis not present

## 2023-08-20 DIAGNOSIS — A0472 Enterocolitis due to Clostridium difficile, not specified as recurrent: Secondary | ICD-10-CM | POA: Diagnosis not present

## 2023-08-20 DIAGNOSIS — Z860100 Personal history of colon polyps, unspecified: Secondary | ICD-10-CM | POA: Diagnosis not present

## 2023-08-20 DIAGNOSIS — I714 Abdominal aortic aneurysm, without rupture, unspecified: Secondary | ICD-10-CM | POA: Diagnosis not present

## 2023-08-20 DIAGNOSIS — K5909 Other constipation: Secondary | ICD-10-CM | POA: Diagnosis present

## 2023-08-20 DIAGNOSIS — R634 Abnormal weight loss: Secondary | ICD-10-CM | POA: Diagnosis not present

## 2023-08-20 DIAGNOSIS — K219 Gastro-esophageal reflux disease without esophagitis: Secondary | ICD-10-CM | POA: Diagnosis not present

## 2023-08-20 DIAGNOSIS — Z8261 Family history of arthritis: Secondary | ICD-10-CM

## 2023-08-20 DIAGNOSIS — K5790 Diverticulosis of intestine, part unspecified, without perforation or abscess without bleeding: Secondary | ICD-10-CM | POA: Diagnosis present

## 2023-08-20 DIAGNOSIS — I959 Hypotension, unspecified: Secondary | ICD-10-CM

## 2023-08-20 DIAGNOSIS — Z96641 Presence of right artificial hip joint: Secondary | ICD-10-CM | POA: Diagnosis present

## 2023-08-20 DIAGNOSIS — N401 Enlarged prostate with lower urinary tract symptoms: Secondary | ICD-10-CM | POA: Diagnosis present

## 2023-08-20 DIAGNOSIS — I10 Essential (primary) hypertension: Secondary | ICD-10-CM | POA: Diagnosis present

## 2023-08-20 DIAGNOSIS — R231 Pallor: Secondary | ICD-10-CM | POA: Diagnosis not present

## 2023-08-20 DIAGNOSIS — M519 Unspecified thoracic, thoracolumbar and lumbosacral intervertebral disc disorder: Secondary | ICD-10-CM | POA: Diagnosis present

## 2023-08-20 DIAGNOSIS — F17220 Nicotine dependence, chewing tobacco, uncomplicated: Secondary | ICD-10-CM | POA: Diagnosis present

## 2023-08-20 DIAGNOSIS — R197 Diarrhea, unspecified: Secondary | ICD-10-CM | POA: Diagnosis not present

## 2023-08-20 DIAGNOSIS — R110 Nausea: Secondary | ICD-10-CM | POA: Diagnosis not present

## 2023-08-20 DIAGNOSIS — R933 Abnormal findings on diagnostic imaging of other parts of digestive tract: Secondary | ICD-10-CM | POA: Diagnosis not present

## 2023-08-20 DIAGNOSIS — E876 Hypokalemia: Secondary | ICD-10-CM | POA: Diagnosis not present

## 2023-08-20 DIAGNOSIS — Z8249 Family history of ischemic heart disease and other diseases of the circulatory system: Secondary | ICD-10-CM

## 2023-08-20 DIAGNOSIS — K529 Noninfective gastroenteritis and colitis, unspecified: Secondary | ICD-10-CM | POA: Diagnosis not present

## 2023-08-20 DIAGNOSIS — E785 Hyperlipidemia, unspecified: Secondary | ICD-10-CM | POA: Diagnosis present

## 2023-08-20 DIAGNOSIS — Z8042 Family history of malignant neoplasm of prostate: Secondary | ICD-10-CM

## 2023-08-20 DIAGNOSIS — R6881 Early satiety: Secondary | ICD-10-CM | POA: Diagnosis not present

## 2023-08-20 DIAGNOSIS — E782 Mixed hyperlipidemia: Secondary | ICD-10-CM | POA: Diagnosis not present

## 2023-08-20 DIAGNOSIS — Z96652 Presence of left artificial knee joint: Secondary | ICD-10-CM | POA: Diagnosis present

## 2023-08-20 DIAGNOSIS — E7800 Pure hypercholesterolemia, unspecified: Secondary | ICD-10-CM | POA: Diagnosis not present

## 2023-08-20 DIAGNOSIS — R1084 Generalized abdominal pain: Secondary | ICD-10-CM | POA: Diagnosis not present

## 2023-08-20 LAB — TYPE AND SCREEN
ABO/RH(D): A POS
Antibody Screen: NEGATIVE

## 2023-08-20 LAB — URINALYSIS, ROUTINE W REFLEX MICROSCOPIC
Bilirubin Urine: NEGATIVE
Glucose, UA: NEGATIVE mg/dL
Hgb urine dipstick: NEGATIVE
Ketones, ur: NEGATIVE mg/dL
Leukocytes,Ua: NEGATIVE
Nitrite: NEGATIVE
Protein, ur: NEGATIVE mg/dL
Specific Gravity, Urine: 1.013 (ref 1.005–1.030)
pH: 5 (ref 5.0–8.0)

## 2023-08-20 LAB — CBC WITH DIFFERENTIAL/PLATELET
Abs Immature Granulocytes: 0.06 10*3/uL (ref 0.00–0.07)
Basophils Absolute: 0 10*3/uL (ref 0.0–0.1)
Basophils Relative: 0 %
Eosinophils Absolute: 0 10*3/uL (ref 0.0–0.5)
Eosinophils Relative: 0 %
HCT: 39.6 % (ref 39.0–52.0)
Hemoglobin: 12.7 g/dL — ABNORMAL LOW (ref 13.0–17.0)
Immature Granulocytes: 0 %
Lymphocytes Relative: 5 %
Lymphs Abs: 0.7 10*3/uL (ref 0.7–4.0)
MCH: 30.7 pg (ref 26.0–34.0)
MCHC: 32.1 g/dL (ref 30.0–36.0)
MCV: 95.7 fL (ref 80.0–100.0)
Monocytes Absolute: 0.8 10*3/uL (ref 0.1–1.0)
Monocytes Relative: 6 %
Neutro Abs: 12 10*3/uL — ABNORMAL HIGH (ref 1.7–7.7)
Neutrophils Relative %: 89 %
Platelets: 197 10*3/uL (ref 150–400)
RBC: 4.14 MIL/uL — ABNORMAL LOW (ref 4.22–5.81)
RDW: 13.6 % (ref 11.5–15.5)
WBC: 13.7 10*3/uL — ABNORMAL HIGH (ref 4.0–10.5)
nRBC: 0 % (ref 0.0–0.2)

## 2023-08-20 LAB — CBC
HCT: 41 % (ref 39.0–52.0)
HCT: 41.2 % (ref 39.0–52.0)
Hemoglobin: 13.3 g/dL (ref 13.0–17.0)
Hemoglobin: 13.5 g/dL (ref 13.0–17.0)
MCH: 31.8 pg (ref 26.0–34.0)
MCH: 31 pg (ref 26.0–34.0)
MCHC: 32.4 g/dL (ref 30.0–36.0)
MCHC: 32.8 g/dL (ref 30.0–36.0)
MCV: 98.1 fL (ref 80.0–100.0)
MCV: 94.7 fL (ref 80.0–100.0)
Platelets: 133 10*3/uL — ABNORMAL LOW (ref 150–400)
Platelets: 213 10*3/uL (ref 150–400)
RBC: 4.18 MIL/uL — ABNORMAL LOW (ref 4.22–5.81)
RBC: 4.35 MIL/uL (ref 4.22–5.81)
RDW: 13.7 % (ref 11.5–15.5)
RDW: 13.7 % (ref 11.5–15.5)
WBC: 14.3 10*3/uL — ABNORMAL HIGH (ref 4.0–10.5)
WBC: 15.4 10*3/uL — ABNORMAL HIGH (ref 4.0–10.5)
nRBC: 0 % (ref 0.0–0.2)
nRBC: 0 % (ref 0.0–0.2)

## 2023-08-20 LAB — POC OCCULT BLOOD, ED: Fecal Occult Bld: POSITIVE — AB

## 2023-08-20 LAB — TROPONIN I (HIGH SENSITIVITY)
Troponin I (High Sensitivity): 4 ng/L (ref <18)
Troponin I (High Sensitivity): 7 ng/L (ref <18)

## 2023-08-20 LAB — COMPREHENSIVE METABOLIC PANEL
ALT: 18 U/L (ref 0–44)
AST: 20 U/L (ref 15–41)
Albumin: 3.8 g/dL (ref 3.5–5.0)
Alkaline Phosphatase: 55 U/L (ref 38–126)
Anion gap: 9 (ref 5–15)
BUN: 23 mg/dL (ref 8–23)
CO2: 21 mmol/L — ABNORMAL LOW (ref 22–32)
Calcium: 8.6 mg/dL — ABNORMAL LOW (ref 8.9–10.3)
Chloride: 109 mmol/L (ref 98–111)
Creatinine, Ser: 1.06 mg/dL (ref 0.61–1.24)
GFR, Estimated: >60 mL/min (ref >60)
Glucose, Bld: 118 mg/dL — ABNORMAL HIGH (ref 70–99)
Potassium: 3.8 mmol/L (ref 3.5–5.1)
Sodium: 139 mmol/L (ref 135–145)
Total Bilirubin: 0.5 mg/dL (ref 0.3–1.2)
Total Protein: 6 g/dL — ABNORMAL LOW (ref 6.5–8.1)

## 2023-08-20 LAB — LIPASE, BLOOD: Lipase: 30 U/L (ref 11–51)

## 2023-08-20 MED ORDER — SODIUM CHLORIDE 0.9 % IV SOLN: INTRAVENOUS | Status: AC

## 2023-08-20 MED ORDER — SODIUM CHLORIDE 0.9 % IV BOLUS
500.0000 mL | Freq: Once | INTRAVENOUS | Status: AC
  Administered 2023-08-20: 500 mL via INTRAVENOUS

## 2023-08-20 MED ORDER — ACETAMINOPHEN 650 MG RE SUPP: 650.0000 mg | Freq: Four times a day (QID) | RECTAL | Status: DC | PRN

## 2023-08-20 MED ORDER — TAMSULOSIN HCL 0.4 MG PO CAPS
0.4000 mg | ORAL_CAPSULE | Freq: Every day | ORAL | Status: DC
  Administered 2023-08-21 – 2023-08-22 (×2): 0.4 mg via ORAL
  Filled 2023-08-20 (×2): qty 1

## 2023-08-20 MED ORDER — SIMVASTATIN 20 MG PO TABS
10.0000 mg | ORAL_TABLET | Freq: Every day | ORAL | Status: DC
  Administered 2023-08-21 – 2023-08-22 (×2): 10 mg via ORAL
  Filled 2023-08-20 (×2): qty 1

## 2023-08-20 MED ORDER — METRONIDAZOLE 500 MG/100ML IV SOLN
500.0000 mg | Freq: Two times a day (BID) | INTRAVENOUS | Status: DC
  Administered 2023-08-20 – 2023-08-21 (×3): 500 mg via INTRAVENOUS
  Filled 2023-08-20 (×3): qty 100

## 2023-08-20 MED ORDER — TRAMADOL HCL 50 MG PO TABS
50.0000 mg | ORAL_TABLET | Freq: Every day | ORAL | Status: DC
  Administered 2023-08-20 – 2023-08-22 (×3): 50 mg via ORAL
  Filled 2023-08-20 (×3): qty 1

## 2023-08-20 MED ORDER — PANTOPRAZOLE SODIUM 40 MG IV SOLR
40.0000 mg | Freq: Once | INTRAVENOUS | Status: AC
  Administered 2023-08-20: 40 mg via INTRAVENOUS
  Filled 2023-08-20: qty 10

## 2023-08-20 MED ORDER — SODIUM CHLORIDE 0.9 % IV SOLN
2.0000 g | INTRAVENOUS | Status: DC
  Administered 2023-08-20 – 2023-08-21 (×2): 2 g via INTRAVENOUS
  Filled 2023-08-20 (×2): qty 20

## 2023-08-20 MED ORDER — ACETAMINOPHEN 325 MG PO TABS: 650.0000 mg | ORAL_TABLET | Freq: Four times a day (QID) | ORAL | Status: DC | PRN

## 2023-08-20 MED ORDER — INFLUENZA VAC A&B SURF ANT ADJ 0.5 ML IM SUSY
0.5000 mL | PREFILLED_SYRINGE | INTRAMUSCULAR | Status: DC
  Filled 2023-08-20: qty 0.5

## 2023-08-20 MED ORDER — IOHEXOL 350 MG/ML SOLN
75.0000 mL | Freq: Once | INTRAVENOUS | Status: AC | PRN
  Administered 2023-08-20: 75 mL via INTRAVENOUS

## 2023-08-20 MED ORDER — PANTOPRAZOLE SODIUM 40 MG PO TBEC
40.0000 mg | DELAYED_RELEASE_TABLET | Freq: Two times a day (BID) | ORAL | Status: DC
  Administered 2023-08-21 – 2023-08-23 (×4): 40 mg via ORAL
  Filled 2023-08-20 (×4): qty 1

## 2023-08-20 NOTE — Plan of Care (Signed)

## 2023-08-20 NOTE — H&P (Signed)
History and Physical    Adrian Davis:[id number] DOB: 1941-05-29 DOA: 08/20/2023  PCP: Eustaquio Boyden, MD  Patient coming from: Home  Chief Complaint: Abdominal pain  HPI: Adrian Davis is a 82 y.o. male with medical history significant of hypertension, hyperlipidemia, BPH, GERD, nephrolithiasis, gout, hemorrhoids, colon polyps, presented to the ED with abdominal pain.  Patient had a near syncopal event/became hypotensive and having a bowel movement.  Blood pressure 90/50 with EMS and was given 500 mL IV fluids.  Vital signs stable on arrival.  In the ED, he had multiple episodes of bloody diarrhea but remained hemodynamically stable.  CT showing evidence of colitis. Labs notable for WBC count 14.3> 13.7, hemoglobin 13.3> 12.7, troponin negative x 2, FOBT positive. Patient was given IV Protonix 40 mg and 500 mL normal saline in the ED.  [facility] called to admit.  History provided by the patient and his wife.  Wife states patient is chronically constipated and takes laxatives.  He was having lower abdominal pain all day today and went to the bathroom multiple times but could not have a bowel movement.  The last time he went to the bathroom, he was straining to have a bowel movement when he became clammy, lightheaded, and felt like he was going to pass out.  Soon after he had a very large bowel movement and it was diarrhea mixed with blood.  Since he has been in the emergency room, he has had additional episodes of diarrhea and now it looks more bloody.  Patient reports improvement of his abdominal pain since after having bowel movements.  He takes aspirin at home and no other blood thinners.  Denies nausea or vomiting.  Denies cough, shortness breath, or chest pain.  Review of Systems:  Review of Systems  All other systems reviewed and are negative.   Past Medical History:  Diagnosis Date   Abnormal echocardiogram Jan 2012   grade 1 diastolic dysfunction, normal EF, mild LVH   Anemia     Basal cell carcinoma 04/08/2019   left nose inferior to nasal alar groove. EDC: 05/20/2019   Bell palsy 10/2010   presented with facial numbness   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    Degenerative disk disease    GERD (gastroesophageal reflux disease)    History of kidney stones    Hyperlipidemia    Hypertension    dr [name]   Kidney cysts    Lumbar stenosis    Nephrolithiasis    Normal nuclear stress test 2008   Osteoarthritis    Skin cancer    Vocal cord leukoplakia    Dr Lazarus Salines    Past Surgical History:  Procedure Laterality Date   COLONOSCOPY  04/2019   TAx2, hemorrhoids, rpt 40yrs ([place])   ESOPHAGOGASTRODUODENOSCOPY  04/2019   erosive gastropathy, biopsy negative for barrett's ([place])   KNEE ARTHROSCOPY Right    LUMBAR LAMINECTOMY/DECOMPRESSION MICRODISCECTOMY N/A 11/12/2013   Cristi Loron, MD - complicated by urinary retention   MULTIPLE TOOTH EXTRACTIONS     SHOULDER SURGERY Bilateral    TONSILLECTOMY     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   Procedure: TOTAL HIP ARTHROPLASTY ANTERIOR APPROACH;  Surgeon: Ollen Gross, MD;  Location: [facility];  Service: Orthopedics;  Laterality: Right;    TOTAL KNEE ARTHROPLASTY Left 11/21/2021   Procedure: TOTAL KNEE ARTHROPLASTY;  Surgeon: Ollen Gross, MD;  Location: [facility];  Service: Orthopedics;  Laterality: Left;     reports that he  quit smoking about 47 years ago. His smoking use included cigarettes. He started smoking about 57 years ago. He has a 15 pack-year smoking history. His smokeless tobacco use includes chew. He reports current alcohol use. He reports that he does not use drugs.  No Known Allergies  Family History  Problem Relation Age of Onset   Heart failure Father 58   Hypertension Father    Arthritis Father    Anemia Mother        needed regular transfusions   Prostate cancer Son    Cancer Neg Hx    Diabetes Neg Hx    Colon cancer Neg Hx    Rectal  cancer Neg Hx    Stomach cancer Neg Hx    Inflammatory bowel disease Neg Hx    Liver disease Neg Hx    Pancreatic cancer Neg Hx     Prior to Admission medications   Medication Sig Start Date End Date Taking? Authorizing Provider  acetaminophen (TYLENOL) 650 MG CR tablet Take 1,300 mg by mouth every 8 (eight) hours as needed for pain.    [provider]  allopurinol (ZYLOPRIM) 100 MG tablet Take 1 tablet (100 mg total) by mouth daily. 12/22/22   Eustaquio Boyden, MD  amLODipine (NORVASC) 2.5 MG tablet TAKE 2 TABLETS BY MOUTH EVERY MORNING AND 1 TABLET IN THE AFTERNOON AS DIRECTED 06/14/22   [place], Peter M, MD  aspirin 81 MG chewable tablet Chew by mouth daily.    [provider]  benazepril (LOTENSIN) 20 MG tablet TAKE ONE TABLET BY MOUTH TWO TIMES DAILY 08/08/23   [place], Peter M, MD  ciclopirox [facility]) 8 % solution Apply topically at bedtime. Apply over nail and surrounding skin. Apply daily over previous coat. After seven (7) days, may remove with alcohol and continue cycle. 12/06/22   Eustaquio Boyden, MD  colchicine 0.6 MG tablet Take 2 tablets by mouth at gout onset, then take 1 tablet daily until gout flare resolved 01/31/23   Eustaquio Boyden, MD  cyanocobalamin (VITAMIN B12) 1000 MCG tablet Take 1 tablet (1,000 mcg total) by mouth every Monday, Wednesday, and Friday. 01/31/23   Eustaquio Boyden, MD  Diclofenac Sodium (VOLTAREN ARTHRITIS PAIN EX) Apply topically.    [provider]  docusate sodium (COLACE) 100 MG capsule Take 1 capsule (100 mg total) by mouth 2 (two) times daily. Patient taking differently: Take 100 mg by mouth 2 (two) times daily as needed for moderate constipation. 06/10/20   [place], Peter M, MD  famotidine (PEPCID) 20 MG tablet Take 1 tablet (20 mg total) by mouth 2 (two) times daily. 01/31/23   Eustaquio Boyden, MD  hydrochlorothiazide (HYDRODIURIL) 25 MG tablet TAKE ONE TABLET BY MOUTH ONCE A DAY AS NEEDED FOR SWELLING 03/27/23   [place],  Peter M, MD  ketoconazole (NIZORAL) 2 % cream Apply 1 application topically daily as needed for irritation.    [provider]  loratadine (CLARITIN) 10 MG tablet Take 10 mg by mouth daily.    [provider]  meloxicam (MOBIC) 7.5 MG tablet Take 7.5 mg by mouth daily. 10/04/21   [provider]  pantoprazole (PROTONIX) 40 MG tablet TAKE 1 TABLET BY MOUTH TWICE A DAY 07/10/22   [place], Peter M, MD  polyethylene glycol powder (GLYCOLAX/MIRALAX) 17 GM/SCOOP powder TAKE 17 GRAMS BY MOUTH TWICE DAILY Patient taking differently: Take 17 g by mouth at bedtime. Takes as needed 09/28/20   [place], Netty Starring., MD  potassium chloride SA (KLOR-CON M20) 20 MEQ  tablet Take 1 tablet (20 mEq total) by mouth daily. 07/04/23 10/02/23  [place], Peter M, MD  simvastatin (ZOCOR) 10 MG tablet TAKE 1 TABLET BY MOUTH ONCE A DAY AT [facility] THE EVENING 11/20/22   [place], Peter M, MD  tamsulosin [facility]) 0.4 MG CAPS capsule TAKE ONE CAPSULE BY MOUTH ONCE DAILY AFTER SUPPER 01/30/23   [place], Peter M, MD  thiamine (VITAMIN B1) 100 MG tablet Take 1 tablet (100 mg total) by mouth every Monday, Wednesday, and Friday. 02/05/23   Eustaquio Boyden, MD  traMADol (ULTRAM) 50 MG tablet Take 50 mg by mouth every 6 (six) hours as needed.    [provider]    Physical Exam: Vitals:   08/20/23 1730 08/20/23 1800 08/20/23 1842 08/20/23 1930  BP: 134/68 (!) 112/54 137/82 127/78  Pulse: 94 75 100 (!) 102  Resp: 20 16 20 20   Temp:      TempSrc:      SpO2: 98% 98% 98% 98%  Weight:      Height:        Physical Exam Vitals reviewed.  Constitutional:      General: He is not in acute distress. HENT:     Head: Normocephalic and atraumatic.  Eyes:     Extraocular Movements: Extraocular movements intact.  Cardiovascular:     Rate and Rhythm: Normal rate and regular rhythm.     Pulses: Normal pulses.  Pulmonary:     Effort: Pulmonary effort is normal. No respiratory distress.     Breath sounds:  Normal breath sounds. No wheezing or rales.  Abdominal:     General: Bowel sounds are normal. There is no distension.     Palpations: Abdomen is soft.     Tenderness: There is no abdominal tenderness. There is no guarding.  Musculoskeletal:     Cervical back: Normal range of motion.     Right lower leg: No edema.     Left lower leg: No edema.  Skin:    General: Skin is warm and dry.  Neurological:     General: No focal deficit present.     Mental Status: He is alert and oriented to person, place, and time.     Labs on Admission: I have personally reviewed following labs and imaging studies  CBC: Recent Labs  Lab 08/20/23 1050 08/20/23 1902  WBC 14.3* 13.7*  NEUTROABS  --  12.0*  HGB 13.3 12.7*  HCT 41.0 39.6  MCV 98.1 95.7  PLT 133* 197   Basic Metabolic Panel: Recent Labs  Lab 08/20/23 1212  NA 139  K 3.8  CL 109  CO2 21*  GLUCOSE 118*  BUN 23  CREATININE 1.06  CALCIUM 8.6*   GFR: Estimated Creatinine Clearance: 52 mL/min (by C-G formula based on SCr of 1.06 mg/dL). Liver Function Tests: Recent Labs  Lab 08/20/23 1212  AST 20  ALT 18  ALKPHOS 55  BILITOT 0.5  PROT 6.0*  ALBUMIN 3.8   Recent Labs  Lab 08/20/23 1212  LIPASE 30   No results for input(s): "AMMONIA" in the last 168 hours. Coagulation Profile: No results for input(s): "INR", "PROTIME" in the last 168 hours. Cardiac Enzymes: No results for input(s): "CKTOTAL", "CKMB", "CKMBINDEX", "TROPONINI" in the last 168 hours. BNP (last 3 results) No results for input(s): "PROBNP" in the last 8760 hours. HbA1C: No results for input(s): "HGBA1C" in the last 72 hours. CBG: No results for input(s): "GLUCAP" in the last 168 hours. Lipid Profile: No results for input(s): "CHOL", "  HDL", "LDLCALC", "TRIG", "CHOLHDL", "LDLDIRECT" in the last 72 hours. Thyroid Function Tests: No results for input(s): "TSH", "T4TOTAL", "FREET4", "T3FREE", "THYROIDAB" in the last 72 hours. Anemia Panel: No results for  input(s): "VITAMINB12", "FOLATE", "FERRITIN", "TIBC", "IRON", "RETICCTPCT" in the last 72 hours. Urine analysis:    Component Value Date/Time   COLORURINE YELLOW 08/20/2023 1441   APPEARANCEUR CLEAR 08/20/2023 1441   LABSPEC 1.013 08/20/2023 1441   PHURINE 5.0 08/20/2023 1441   GLUCOSEU NEGATIVE 08/20/2023 1441   HGBUR NEGATIVE 08/20/2023 1441   BILIRUBINUR NEGATIVE 08/20/2023 1441   BILIRUBINUR negative 11/11/2021 1106   KETONESUR NEGATIVE 08/20/2023 1441   PROTEINUR NEGATIVE 08/20/2023 1441   UROBILINOGEN 0.2 11/11/2021 1106   UROBILINOGEN 0.2 11/12/2010 2125   NITRITE NEGATIVE 08/20/2023 1441   LEUKOCYTESUR NEGATIVE 08/20/2023 1441    Radiological Exams on Admission: CT ABDOMEN PELVIS W CONTRAST  Result Date: 08/20/2023 CLINICAL DATA:  Acute abdominal pain. EXAM: CT ABDOMEN AND PELVIS WITH CONTRAST TECHNIQUE: Multidetector CT imaging of the abdomen and pelvis was performed using the standard protocol following bolus administration of intravenous contrast. RADIATION DOSE REDUCTION: This exam was performed according to the departmental dose-optimization program which includes automated exposure control, adjustment of the mA and/or kV according to patient size and/or use of iterative reconstruction technique. CONTRAST:  75mL OMNIPAQUE IOHEXOL 350 MG/ML SOLN COMPARISON:  CT 11/04/2018 FINDINGS: Lower chest: There is some linear opacity lung bases likely scar or atelectasis. No pleural effusion. Coronary artery calcifications are seen. Hepatobiliary: Fatty liver infiltration. Patent portal vein. Gallbladder is present. Pancreas: Unremarkable. No pancreatic ductal dilatation or surrounding inflammatory changes. Spleen: Normal in size without focal abnormality.  Small splenule. Adrenals/Urinary Tract: Adrenal glands are preserved. No collecting system dilatation. The ureters have normal course and caliber extending down to the bladder. Several bilateral renal cystic foci identified. Several which  have some small calcifications and thin septations but are consistent with Bosniak 2 lesions and Bosniak 1 lesions. One of the larger foci exophytic from the lower pole on the left has a diameter of 6.9 cm with a Hounsfield unit of 15 and would be a Bosniak 1 lesion. There is a Bosniak 2 lesion for example exophytic medial from the right kidney measuring 3.5 cm in diameter on series 3, image 30 with a thin septation with calcification. Bosniak 2 lesion. Stomach/Bowel: There is a long segment of wall thickening with stranding along the descending, sigmoid colon and rectum consistent with a long segment colitis. More slight changes along the transverse colon in the ascending colon appears more normal. No bowel obstruction or dilatation of the colon. Normal appendix in the right hemipelvis. On this non oral contrast exam the stomach is relatively decompressed. Small bowel is nondilated. Vascular/Lymphatic: Normal caliber IVC with scattered arterial vascular calcifications. Areas of stenosis along the renal arteries. There is also a second there appearance to the abdominal aorta with enlargement overall of 2.2 x 3.1 cm. This has slightly increasing from the study of 2020. The saccular component on series 6, image 50 of the coronal data set would measure proximally 16 mm. No specific abnormal lymph node enlargement identified in the abdomen and pelvis. Reproductive: Prominent prostate with mass effect along the base of the bladder. Other: No free intra-abdominal air. Slight mesenteric stranding. Trace simple free fluid in the pelvis. Portions of the pelvis are obscured by the streak artifact related to patient's right hip arthroplasty. Musculoskeletal: Curvature of the spine with moderate degenerative changes. There is moderate degenerative changes of the left  hip. Right hip arthroplasty. IMPRESSION: Long segment left-sided colonic wall thickening with stranding and vascular engorgement consistent with a colitis. No  obstruction or free air. Simple trace free fluid in the pelvis. 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 Diameter of the aorta approaches up to 3.1 cm. The saccular component measures up to 16 mm. Recommend referral to or continued care with vascular specialist. (Ref.: J Vasc Surg. 2018; 67:2-77 and J Am Coll Radiol 2013;10(10):789-794.) Electronically Signed   By: Karen Kays M.D.   On: 08/20/2023 18:15    EKG: Independently reviewed.  Sinus rhythm, LVH, T wave abnormality inferolaterally.  No significant change compared to previous EKG.  Assessment and Plan  Acute colitis/bloody diarrhea Patient presenting with complaint of lower abdominal pain and had a near syncopal episode at home/became hypotensive while trying to have a bowel movement.  Blood pressure initially low with EMS at 90/50 and was given 500 mL IV fluids.  Vital signs stable on arrival to the ED.  He had multiple episodes of bloody diarrhea in the ED but remains hemodynamically stable. WBC count 14.3> 13.7, hemoglobin 13.3> 12.7, FOBT positive.  CT showing evidence of colitis.  Blood cultures ordered and will start antibiotics including ceftriaxone and metronidazole.  Type and screen, serial CBC every 6 hours.  Discussed with the patient and he is okay with receiving blood transfusions if needed.  Hold home aspirin.  He is not on anticoagulation.  C. difficile PCR and GI pathogen panel ordered, follow enteric precautions.  Admit to progressive care unit and monitor very closely.  I have requested consultation in the morning from [facility] (Dr. Barron Alvine), he recommends checking fecal calprotectin as well.  Will order clear liquid diet for now and keep n.p.o. after midnight.  Hold home antihypertensives and continue IV fluid hydration.  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 Diameter of the aorta approaches up to 3.1  cm. The saccular component measures up to 16 mm."  Patient will need outpatient vascular surgery follow-up.  Hypertension Hold antihypertensives at this time.  Hyperlipidemia Continue Zocor.  BPH Continue Flomax.  GERD Continue Protonix.  Chronic pain Continue home tramadol.  DVT prophylaxis: SCDs Code Status: Full Code (discussed with the patient and his wife) Family Communication: Wife at bedside. Level of care: Progressive Care Unit Admission status: It is my clinical opinion that referral for OBSERVATION is reasonable and necessary in this patient based on the above information provided. The aforementioned taken together are felt to place the patient at high risk for further clinical deterioration. However, it is anticipated that the patient may be medically stable for discharge from the hospital within 24 to 48 hours.  John Giovanni MD [facility]  If 7PM-7AM, please contact night-coverage [email or web address]  08/20/2023, 7:46 PM

## 2023-08-20 NOTE — ED Notes (Signed)
Pt ambulated to the bathroom without assistance after being asked to hit the call bell and provided with a urinal. Pt states "you don't get here fast enough. If you could be here before I hit the button, I wouldn't; get up.."   Pt asked again to call when he needs to go to the bathroom

## 2023-08-20 NOTE — ED Notes (Signed)
Pt does have some blood mixed with stool while in the bathroom. He is having diarrhea so he has a hard time stopping it. He did have some blood on his underwear and the toilet. Pt cleaned himself with wet washcloth and changed his clothing. Walked back to room with no issues. Pt reconnected to monitor, vss. Pt wife very concerned about the blood, reassurance given

## 2023-08-20 NOTE — ED Provider Notes (Signed)
[place] [facility]GENC[facility] DEPARTMENT AT [facility] Provider Note   CSN: [id number] Arrival date & time: 08/20/23  1036     History {Add pertinent medical, surgical, social history, OB history to HPI:1} Chief Complaint  Patient presents with   Abdominal Pain    Adrian Davis is a 82 y.o. male.  82 year old male with prior history as detailed below presents for evaluation.  Patient was in his normal state of health until this morning.  Patient had a sudden urge to have a bowel movement.  While having the bowel movement he felt weak, dizzy, like he might pass out.  Symptoms lasted approximately 45 minutes.  His neighbor is an Pharmacologist.  Patient was noted by the neighbor to have mild hypotension and to be clammy.  EMS administered 500 mL of normal saline and 4 mg of Zofran during transport.  Patient reports significant improvement on evaluation here in the ED.  He denies current symptoms.  He denies current abdominal pain or nausea.  Patient reports similar episodes in the past with symptoms that were more minor.  Today's episode was more significant.  He denies bloody stool.  Stool produced this morning was normal in shape and color.  He denies vomiting.  He denies recent illness.  He denies fever.  He denies associated chest pain or shortness of breath.  The history is provided by the patient and medical records.       Home Medications Prior to Admission medications   Medication Sig Start Date End Date Taking? Authorizing Provider  acetaminophen (TYLENOL) 650 MG CR tablet Take 1,300 mg by mouth every 8 (eight) hours as needed for pain.    [provider]  allopurinol (ZYLOPRIM) 100 MG tablet Take 1 tablet (100 mg total) by mouth daily. 12/22/22   Eustaquio Boyden, MD  amLODipine (NORVASC) 2.5 MG tablet TAKE 2 TABLETS BY MOUTH EVERY MORNING AND 1 TABLET IN THE AFTERNOON AS DIRECTED 06/14/22   [place], Undra Harriman M, MD  aspirin 81 MG chewable tablet Chew by mouth  daily.    [provider]  benazepril (LOTENSIN) 20 MG tablet TAKE ONE TABLET BY MOUTH TWO TIMES DAILY 08/08/23   [place], Justen Fonda M, MD  ciclopirox [facility]) 8 % solution Apply topically at bedtime. Apply over nail and surrounding skin. Apply daily over previous coat. After seven (7) days, may remove with alcohol and continue cycle. 12/06/22   Eustaquio Boyden, MD  colchicine 0.6 MG tablet Take 2 tablets by mouth at gout onset, then take 1 tablet daily until gout flare resolved 01/31/23   Eustaquio Boyden, MD  cyanocobalamin (VITAMIN B12) 1000 MCG tablet Take 1 tablet (1,000 mcg total) by mouth every Monday, Wednesday, and Friday. 01/31/23   Eustaquio Boyden, MD  Diclofenac Sodium (VOLTAREN ARTHRITIS PAIN EX) Apply topically.    [provider]  docusate sodium (COLACE) 100 MG capsule Take 1 capsule (100 mg total) by mouth 2 (two) times daily. Patient taking differently: Take 100 mg by mouth 2 (two) times daily as needed for moderate constipation. 06/10/20   [place], Sameer Teeple M, MD  famotidine (PEPCID) 20 MG tablet Take 1 tablet (20 mg total) by mouth 2 (two) times daily. 01/31/23   Eustaquio Boyden, MD  hydrochlorothiazide (HYDRODIURIL) 25 MG tablet TAKE ONE TABLET BY MOUTH ONCE A DAY AS NEEDED FOR SWELLING 03/27/23   [place], Tawnee Clegg M, MD  ketoconazole (NIZORAL) 2 % cream Apply 1 application topically daily as needed for irritation.    [provider]  loratadine (CLARITIN) 10 MG tablet Take 10 mg by mouth daily.    [provider]  meloxicam (MOBIC) 7.5 MG tablet Take 7.5 mg by mouth daily. 10/04/21   [provider]  pantoprazole (PROTONIX) 40 MG tablet TAKE 1 TABLET BY MOUTH TWICE A DAY 07/10/22   [place], Kayann Maj M, MD  polyethylene glycol powder (GLYCOLAX/MIRALAX) 17 GM/SCOOP powder TAKE 17 GRAMS BY MOUTH TWICE DAILY Patient taking differently: Take 17 g by mouth at bedtime. Takes as needed 09/28/20   Mansouraty, Netty Starring., MD  potassium chloride SA (KLOR-CON M20)  20 MEQ tablet Take 1 tablet (20 mEq total) by mouth daily. 07/04/23 10/02/23  [place], Timi Reeser M, MD  simvastatin (ZOCOR) 10 MG tablet TAKE 1 TABLET BY MOUTH ONCE A DAY AT [facility] THE EVENING 11/20/22   [place], Shania Bjelland M, MD  tamsulosin [facility]) 0.4 MG CAPS capsule TAKE ONE CAPSULE BY MOUTH ONCE DAILY AFTER SUPPER 01/30/23   [place], Juri Dinning M, MD  thiamine (VITAMIN B1) 100 MG tablet Take 1 tablet (100 mg total) by mouth every Monday, Wednesday, and Friday. 02/05/23   Eustaquio Boyden, MD  traMADol (ULTRAM) 50 MG tablet Take 50 mg by mouth every 6 (six) hours as needed.    [provider]      Allergies    Patient has no known allergies.    Review of Systems   Review of Systems  All other systems reviewed and are negative.   Physical Exam Updated Vital Signs BP 122/67 (BP Location: Right Arm)   Pulse 68   Temp (!) 97.5 F (36.4 C) (Oral)   Resp 16   Ht 5' 7.5" (1.715 m)   Wt 77 kg   SpO2 100%   BMI 26.19 kg/m  Physical Exam Vitals and nursing note reviewed.  Constitutional:      General: He is not in acute distress.    Appearance: Normal appearance. He is well-developed.  HENT:     Head: Normocephalic and atraumatic.  Eyes:     Conjunctiva/sclera: Conjunctivae normal.     Pupils: Pupils are equal, round, and reactive to light.  Cardiovascular:     Rate and Rhythm: Normal rate and regular rhythm.     Heart sounds: Normal heart sounds.  Pulmonary:     Effort: Pulmonary effort is normal. No respiratory distress.     Breath sounds: Normal breath sounds.  Abdominal:     General: There is no distension.     Palpations: Abdomen is soft.     Tenderness: There is no abdominal tenderness.  Musculoskeletal:        General: No deformity. Normal range of motion.     Cervical back: Normal range of motion and neck supple.  Skin:    General: Skin is warm and dry.  Neurological:     General: No focal deficit present.     Mental Status: He is alert and oriented to person, place, and  time.     ED Results / Procedures / Treatments   Labs (all labs ordered are listed, but only abnormal results are displayed) Labs Reviewed  LIPASE, BLOOD  COMPREHENSIVE METABOLIC PANEL  CBC  URINALYSIS, ROUTINE W REFLEX MICROSCOPIC  TROPONIN I (HIGH SENSITIVITY)    EKG None  Radiology No results found.  Procedures Procedures  {Document cardiac monitor, telemetry assessment procedure when appropriate:1}  Medications Ordered in ED Medications  sodium chloride 0.9 % bolus 500 mL (has no administration in time range)    ED Course/ Medical Decision Making/  A&P   {   Click here for ABCD2, HEART and other calculatorsREFRESH Note before signing :1}                              Medical Decision Making Amount and/or Complexity of Data Reviewed Labs: ordered. Radiology: ordered.  Risk Prescription drug management.    Medical Screen Complete  This patient presented to the ED with complaint of ***.  This complaint involves an extensive number of treatment options. The initial differential diagnosis includes, but is not limited to, ***  This presentation is: {IllnessRisk:19196::"***","Acute","Chronic","Self-Limited","Previously Undiagnosed","Uncertain Prognosis","Complicated","Systemic Symptoms","Threat to Life/Bodily Function"}    Co morbidities that complicated the patient's evaluation  ***   Additional history obtained:  Additional history obtained from {History source:19196::"EMS","Spouse","Family","Friend","Caregiver"} External records from outside sources obtained and reviewed including prior ED visits and prior Inpatient records.    Lab Tests:  I ordered and personally interpreted labs.  The pertinent results include:  ***   Imaging Studies ordered:  I ordered imaging studies including ***  I independently visualized and interpreted obtained imaging which showed *** I agree with the radiologist interpretation.   Cardiac Monitoring:  The patient  was maintained on a cardiac monitor.  I personally viewed and interpreted the cardiac monitor which showed an underlying rhythm of: ***   Medicines ordered:  I ordered medication including ***  for ***  Reevaluation of the patient after these medicines showed that the patient: {resolved/improved/worsened:23923::"improved"}    Test Considered:  ***   Critical Interventions:  ***   Consultations Obtained:  I consulted ***,  and discussed lab and imaging findings as well as pertinent plan of care.    Problem List / ED Course:  ***   Reevaluation:  After the interventions noted above, I reevaluated the patient and found that they have: {resolved/improved/worsened:23923::"improved"}   Social Determinants of Health:  ***   Disposition:  After consideration of the diagnostic results and the patients response to treatment, I feel that the patent would benefit from ***.    {Document critical care time when appropriate:1} {Document review of labs and clinical decision tools ie heart score, Chads2Vasc2 etc:1}  {Document your independent review of radiology images, and any outside records:1} {Document your discussion with family members, caretakers, and with consultants:1} {Document social determinants of health affecting pt's care:1} {Document your decision making why or why not admission, treatments were needed:1} Final Clinical Impression(s) / ED Diagnoses Final diagnoses:  None    Rx / DC Orders ED Discharge Orders     None

## 2023-08-20 NOTE — ED Triage Notes (Addendum)
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 Pt feeling unwell, RLQ abd pain w/ N. Hypotensive 90/50 and felt as if he had to have a bm, attempted but kept getting dizzy while on bathroom toilet. Then had a BM and BP improved and no longer clammy. 500 NS given. 4 mg Zofran 20 L hand. Pt BP when bearing down would decrease but improved once BM passed.  126/72 62 hr 97% RA 183 CBG

## 2023-08-20 NOTE — ED Notes (Addendum)
Pt wife continues to obsess over the blood in the stool. She then states that pt pressure is dropping. Pt is stable at this time. He is now c/o some pain in the lower abdomen. Denies nausea. Pt wife again advised EDP will be in soon to discuss results with them.  Reassurance provided again and EDP advised they are awaiting results

## 2023-08-20 NOTE — ED Notes (Addendum)
Pt has had about 3 bowel movements with blood in the past 1.5 hours... EDP aware. Pt is having another now

## 2023-08-20 NOTE — ED Provider Notes (Signed)
  Physical Exam  BP 137/82   Pulse 100   Temp 98.4 F (36.9 C)   Resp 20   Ht 5' 7.5" (1.715 m)   Wt 77 kg   SpO2 98%   BMI 26.19 kg/m   Physical Exam Vitals and nursing note reviewed.  HENT:     Head: Normocephalic and atraumatic.  Eyes:     Pupils: Pupils are equal, round, and reactive to light.  Cardiovascular:     Rate and Rhythm: Normal rate and regular rhythm.  Pulmonary:     Effort: Pulmonary effort is normal.     Breath sounds: Normal breath sounds.  Abdominal:     Palpations: Abdomen is soft.     Tenderness: There is no abdominal tenderness.  Skin:    General: Skin is warm and dry.  Neurological:     Mental Status: He is alert.  Psychiatric:        Mood and Affect: Mood normal.     Procedures  Procedures  ED Course / MDM   Clinical Course as of 08/20/23 1930  Mon Aug 20, 2023  1929 CT abdomen pelvis shows colitis.  Patient has had multiple bloody bowel movements here in the emergency department.  Hemoglobin is downtrending now 12.7.  Hemodynamically stable.  Not requiring blood transfusion.  Will order Protonix.  Planning for admission to medicine with GI evaluation during hospitalization. [MP]    Clinical Course User Index [MP] Royanne Foots, DO   Medical Decision Making I, Estelle June DO, have assumed care of this patient from the previous provider.  82 year old male presenting after near syncopal episode with abdominal pain and concern for GI bleeding.  Pending CT abdomen pelvis, reevaluation and disposition  Amount and/or Complexity of Data Reviewed Labs: ordered. Radiology: ordered.  Risk Prescription drug management.   Final diagnosis GI bleeding Colitis Near syncope Hypotension       Royanne Foots, DO 08/20/23 1931

## 2023-08-20 NOTE — ED Notes (Signed)
Pt pressed the call bell and this paramedic came into room with patient already unhooking himself and standing to go to the bathroom. Pt made it to the bathroom without assistance.

## 2023-08-21 DIAGNOSIS — K51511 Left sided colitis with rectal bleeding: Secondary | ICD-10-CM | POA: Diagnosis present

## 2023-08-21 DIAGNOSIS — R197 Diarrhea, unspecified: Secondary | ICD-10-CM

## 2023-08-21 DIAGNOSIS — Z87442 Personal history of urinary calculi: Secondary | ICD-10-CM | POA: Diagnosis not present

## 2023-08-21 DIAGNOSIS — K5909 Other constipation: Secondary | ICD-10-CM | POA: Diagnosis present

## 2023-08-21 DIAGNOSIS — R6881 Early satiety: Secondary | ICD-10-CM | POA: Diagnosis not present

## 2023-08-21 DIAGNOSIS — I10 Essential (primary) hypertension: Secondary | ICD-10-CM | POA: Diagnosis present

## 2023-08-21 DIAGNOSIS — E876 Hypokalemia: Secondary | ICD-10-CM | POA: Diagnosis not present

## 2023-08-21 DIAGNOSIS — Z96641 Presence of right artificial hip joint: Secondary | ICD-10-CM | POA: Diagnosis present

## 2023-08-21 DIAGNOSIS — E7800 Pure hypercholesterolemia, unspecified: Secondary | ICD-10-CM

## 2023-08-21 DIAGNOSIS — G8929 Other chronic pain: Secondary | ICD-10-CM | POA: Diagnosis not present

## 2023-08-21 DIAGNOSIS — K529 Noninfective gastroenteritis and colitis, unspecified: Secondary | ICD-10-CM | POA: Diagnosis present

## 2023-08-21 DIAGNOSIS — K219 Gastro-esophageal reflux disease without esophagitis: Secondary | ICD-10-CM | POA: Diagnosis present

## 2023-08-21 DIAGNOSIS — A0472 Enterocolitis due to Clostridium difficile, not specified as recurrent: Secondary | ICD-10-CM | POA: Diagnosis present

## 2023-08-21 DIAGNOSIS — Z23 Encounter for immunization: Secondary | ICD-10-CM | POA: Diagnosis not present

## 2023-08-21 DIAGNOSIS — I7143 Infrarenal abdominal aortic aneurysm, without rupture: Secondary | ICD-10-CM | POA: Diagnosis present

## 2023-08-21 DIAGNOSIS — R634 Abnormal weight loss: Secondary | ICD-10-CM | POA: Diagnosis not present

## 2023-08-21 DIAGNOSIS — M109 Gout, unspecified: Secondary | ICD-10-CM | POA: Diagnosis present

## 2023-08-21 DIAGNOSIS — Z7982 Long term (current) use of aspirin: Secondary | ICD-10-CM | POA: Diagnosis not present

## 2023-08-21 DIAGNOSIS — Z96652 Presence of left artificial knee joint: Secondary | ICD-10-CM | POA: Diagnosis present

## 2023-08-21 DIAGNOSIS — R55 Syncope and collapse: Secondary | ICD-10-CM

## 2023-08-21 DIAGNOSIS — M519 Unspecified thoracic, thoracolumbar and lumbosacral intervertebral disc disorder: Secondary | ICD-10-CM | POA: Diagnosis present

## 2023-08-21 DIAGNOSIS — R933 Abnormal findings on diagnostic imaging of other parts of digestive tract: Secondary | ICD-10-CM | POA: Diagnosis not present

## 2023-08-21 DIAGNOSIS — N401 Enlarged prostate with lower urinary tract symptoms: Secondary | ICD-10-CM | POA: Diagnosis present

## 2023-08-21 DIAGNOSIS — Z85828 Personal history of other malignant neoplasm of skin: Secondary | ICD-10-CM | POA: Diagnosis not present

## 2023-08-21 DIAGNOSIS — Z79899 Other long term (current) drug therapy: Secondary | ICD-10-CM | POA: Diagnosis not present

## 2023-08-21 DIAGNOSIS — I714 Abdominal aortic aneurysm, without rupture, unspecified: Secondary | ICD-10-CM | POA: Diagnosis not present

## 2023-08-21 DIAGNOSIS — E785 Hyperlipidemia, unspecified: Secondary | ICD-10-CM | POA: Diagnosis present

## 2023-08-21 DIAGNOSIS — F17220 Nicotine dependence, chewing tobacco, uncomplicated: Secondary | ICD-10-CM | POA: Diagnosis present

## 2023-08-21 DIAGNOSIS — G894 Chronic pain syndrome: Secondary | ICD-10-CM | POA: Diagnosis present

## 2023-08-21 DIAGNOSIS — E782 Mixed hyperlipidemia: Secondary | ICD-10-CM | POA: Diagnosis not present

## 2023-08-21 DIAGNOSIS — K5790 Diverticulosis of intestine, part unspecified, without perforation or abscess without bleeding: Secondary | ICD-10-CM | POA: Diagnosis present

## 2023-08-21 DIAGNOSIS — K649 Unspecified hemorrhoids: Secondary | ICD-10-CM | POA: Diagnosis present

## 2023-08-21 DIAGNOSIS — Z860100 Personal history of colon polyps, unspecified: Secondary | ICD-10-CM | POA: Diagnosis not present

## 2023-08-21 LAB — C DIFFICILE QUICK SCREEN W PCR REFLEX
C Diff antigen: POSITIVE — AB
C Diff toxin: NEGATIVE

## 2023-08-21 LAB — CLOSTRIDIUM DIFFICILE BY PCR, REFLEXED: Toxigenic C. Difficile by PCR: POSITIVE — AB

## 2023-08-21 LAB — CBC
HCT: 37.3 % — ABNORMAL LOW (ref 39.0–52.0)
Hemoglobin: 12.2 g/dL — ABNORMAL LOW (ref 13.0–17.0)
MCH: 31.1 pg (ref 26.0–34.0)
MCHC: 32.7 g/dL (ref 30.0–36.0)
MCV: 95.2 fL (ref 80.0–100.0)
Platelets: 215 10*3/uL (ref 150–400)
RBC: 3.92 MIL/uL — ABNORMAL LOW (ref 4.22–5.81)
RDW: 13.9 % (ref 11.5–15.5)
WBC: 14 10*3/uL — ABNORMAL HIGH (ref 4.0–10.5)
nRBC: 0 % (ref 0.0–0.2)

## 2023-08-21 LAB — TYPE AND SCREEN
ABO/RH(D): A POS
Antibody Screen: NEGATIVE

## 2023-08-21 MED ORDER — SODIUM CHLORIDE 0.9% FLUSH: 10.0000 mL | INTRAVENOUS | Status: DC | PRN

## 2023-08-21 MED ORDER — CHLORHEXIDINE GLUCONATE CLOTH 2 % EX PADS
6.0000 | MEDICATED_PAD | Freq: Every day | CUTANEOUS | Status: DC
  Administered 2023-08-21 – 2023-08-22 (×2): 6 via TOPICAL

## 2023-08-21 MED ORDER — SODIUM CHLORIDE 0.9% FLUSH
10.0000 mL | Freq: Two times a day (BID) | INTRAVENOUS | Status: DC
  Administered 2023-08-21 – 2023-08-23 (×4): 10 mL

## 2023-08-21 MED ORDER — BOOST / RESOURCE BREEZE PO LIQD CUSTOM
1.0000 | Freq: Three times a day (TID) | ORAL | Status: DC
  Administered 2023-08-21 – 2023-08-22 (×4): 1 via ORAL

## 2023-08-21 NOTE — Consult Note (Signed)
Consultation  Referring Provider:  [facility]/Ghimire Primary Care Physician:  Eustaquio Boyden, MD Primary Gastroenterologist:  Dr. Meridee Score  Reason for Consultation: Bloody diarrhea/acute colitis on CT  HPI: Adrian Davis is a 82 y.o. male, established with Dr. Irish Lack Roddy who had undergone EGD and colonoscopy in 2020. He is currently admitted through the emergency room yesterday, after he had onset of crampy abdominal pain, followed by bloody diarrhea at home on Friday.  He says he continued to have frequent diarrhea mixed with frank red blood over the weekend and on Sunday had a presyncopal episode at home.  His wife called EMS at that point and apparently initial blood pressure was 90/50, he required a fluid bolus and then remained hemodynamically stable. 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  Labs on admit with WBC of 14.3/hemoglobin 13.3/hematocrit 41.0 Troponin was negative Sodium 139/potassium 3.8/BUN 23/creatinine 1.0 LFTs within normal limits Lipase normal Old documented heme positive  GI path panel, C. difficile quick screen and fecal calprotectin were ordered and are all pending.  He has been started empirically on metronidazole and ceftriaxone He is not anticoagulated at home, does take baby aspirin daily.  Patient says that over the past 24 hours he is diarrhea is much smaller volume and he is seeing less blood each time.  He has no nausea or vomiting currently and is hungry, has been tolerating clear liquids.  No complaint of ongoing abdominal pain, he has not had any fever or chills. Does mention that over the past year and a half or so he  is lost almost 35 pounds gradually.  He says he has developed says he has developed fullness with eating very quickly.  Usually only eats about half of his meal he denies any postprandial abdominal pain. 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  EGD in 2020 also mentioned [place] satiety this was negative other than mild gastritis, no H. pylori, normal-appearing duodenum And colonoscopy at that same setting with normal-appearing colonic mucosa, there were 2 small cecal polyps 1 to 2 mm in size removed, TI appeared normal ileocecal valve normal and nonbleeding internal hemorrhoids noted. Biopsy showed the polyps to be tubular adenomas   Past Medical History:  Diagnosis Date   Abnormal echocardiogram Jan 2012   grade 1 diastolic dysfunction, normal EF, mild LVH   Anemia    Basal cell carcinoma 04/08/2019   left nose inferior to nasal alar groove. EDC: 05/20/2019   Bell palsy 10/2010   presented with facial numbness   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    Degenerative disk disease    GERD (gastroesophageal reflux disease)    History of kidney stones    Hyperlipidemia    Hypertension    dr [name]   Kidney cysts    Lumbar stenosis    Nephrolithiasis    Normal nuclear stress test 2008   Osteoarthritis    Skin cancer    Vocal cord leukoplakia    Dr Lazarus Salines    Past Surgical History:  Procedure Laterality Date  COLONOSCOPY  04/2019   TAx2, hemorrhoids, rpt 59yrs ([place])   ESOPHAGOGASTRODUODENOSCOPY  04/2019   erosive gastropathy, biopsy negative for barrett's ([place])   KNEE ARTHROSCOPY Right    LUMBAR LAMINECTOMY/DECOMPRESSION MICRODISCECTOMY N/A 11/12/2013   Cristi Loron, MD - complicated by urinary retention   MULTIPLE TOOTH EXTRACTIONS     SHOULDER SURGERY Bilateral    TONSILLECTOMY     TOTAL HIP ARTHROPLASTY Right  11/10/2020   Procedure: TOTAL HIP ARTHROPLASTY ANTERIOR APPROACH;  Surgeon: Ollen Gross, MD;  Location: [facility];  Service: Orthopedics;  Laterality: Right;    TOTAL KNEE ARTHROPLASTY Left 11/21/2021   Procedure: TOTAL KNEE ARTHROPLASTY;  Surgeon: Ollen Gross, MD;  Location: [facility];  Service: Orthopedics;  Laterality: Left;    Prior to Admission medications   Medication Sig Start Date End Date Taking? Authorizing Provider  acetaminophen (TYLENOL) 650 MG CR tablet Take 650 mg by mouth 3 (three) times daily.   Yes [provider]  allopurinol (ZYLOPRIM) 100 MG tablet Take 1 tablet (100 mg total) by mouth daily. 12/22/22  Yes Eustaquio Boyden, MD  amLODipine (NORVASC) 2.5 MG tablet TAKE 2 TABLETS BY MOUTH EVERY MORNING AND 1 TABLET IN THE AFTERNOON AS DIRECTED Patient taking differently: Take 5 mg by mouth daily. 06/14/22  Yes [place], Peter M, MD  aspirin 81 MG chewable tablet Chew 81 mg by mouth every evening.   Yes [provider]  benazepril (LOTENSIN) 20 MG tablet TAKE ONE TABLET BY MOUTH TWO TIMES DAILY 08/08/23  Yes [place], Peter M, MD  colchicine 0.6 MG tablet Take 2 tablets by mouth at gout onset, then take 1 tablet daily until gout flare resolved Patient taking differently: Take 2 tablets by mouth See admin instructions. Take 2 tablets by mouth at gout onset, then take 1 tablet daily until gout flare resolved 01/31/23  Yes Eustaquio Boyden, MD  cyanocobalamin (VITAMIN B12) 1000 MCG tablet Take 1 tablet (1,000 mcg total) by mouth every Monday, Wednesday, and Friday. 01/31/23  Yes Eustaquio Boyden, MD  docusate sodium (COLACE) 100 MG capsule Take 1 capsule (100 mg total) by mouth 2 (two) times daily. 06/10/20  Yes [place], Peter M, MD  famotidine (PEPCID) 20 MG tablet Take 1 tablet (20 mg total) by mouth 2 (two) times daily. Patient taking differently: Take 40 mg by mouth at bedtime. 01/31/23  Yes Eustaquio Boyden, MD  ketoconazole (NIZORAL) 2 % cream Apply 1  application topically daily as needed for irritation.   Yes [provider]  loratadine (CLARITIN) 10 MG tablet Take 10 mg by mouth daily.   Yes [provider]  meloxicam (MOBIC) 7.5 MG tablet Take 7.5 mg by mouth daily. 10/04/21  Yes [provider]  pantoprazole (PROTONIX) 40 MG tablet TAKE 1 TABLET BY MOUTH TWICE A DAY 07/10/22  Yes [place], Peter M, MD  polyethylene glycol powder (GLYCOLAX/MIRALAX) 17 GM/SCOOP powder TAKE 17 GRAMS BY MOUTH TWICE DAILY Patient taking differently: Take 17 g by mouth at bedtime. 09/28/20  Yes [place], Netty Starring., MD  potassium chloride SA (KLOR-CON M20) 20 MEQ tablet Take 1 tablet (20 mEq total) by mouth daily. Patient taking differently: Take 20 mEq by mouth in the morning and at bedtime. 07/04/23 10/02/23 Yes [place], Peter M, MD  simvastatin (ZOCOR) 10 MG tablet TAKE 1 TABLET BY MOUTH ONCE A DAY AT [facility] THE EVENING Patient taking differently: Take 10 mg by mouth daily at 6 PM. 11/20/22  Yes [place], Peter M, MD  tamsulosin (FLOMAX) 0.4 MG CAPS  capsule TAKE ONE CAPSULE BY MOUTH ONCE DAILY AFTER SUPPER Patient taking differently: Take 0.4 mg by mouth daily after supper. 01/30/23  Yes [place], Peter M, MD  traMADol (ULTRAM) 50 MG tablet Take 50 mg by mouth at bedtime.   Yes [provider]  ciclopirox (PENLAC) 8 % solution Apply topically at bedtime. Apply over nail and surrounding skin. Apply daily over previous coat. After seven (7) days, may remove with alcohol and continue cycle. Patient not taking: Reported on 08/20/2023 12/06/22   Eustaquio Boyden, MD  hydrochlorothiazide (HYDRODIURIL) 25 MG tablet TAKE ONE TABLET BY MOUTH ONCE A DAY AS NEEDED FOR SWELLING Patient taking differently: Take 25 mg by mouth daily as needed (swelling). 03/27/23   [place], Peter M, MD  thiamine (VITAMIN B1) 100 MG tablet Take 1 tablet (100 mg total) by mouth every Monday, Wednesday, and Friday. Patient not taking: Reported on 08/20/2023 02/05/23    Eustaquio Boyden, MD    Current Facility-Administered Medications  Medication Dose Route Frequency Provider Last Rate Last Admin   acetaminophen (TYLENOL) tablet 650 mg  650 mg Oral Q6H PRN John Giovanni, MD       Or   acetaminophen (TYLENOL) suppository 650 mg  650 mg Rectal Q6H PRN John Giovanni, MD       cefTRIAXone (ROCEPHIN) 2 g in sodium chloride 0.9 % 100 mL IVPB  2 g Intravenous Q24H John Giovanni, MD 200 mL/hr at 08/20/23 2216 2 g at 08/20/23 2216   Chlorhexidine Gluconate Cloth 2 % PADS 6 each  6 each Topical Daily Ghimire, Werner Lean, MD       feeding supplement (BOOST / RESOURCE BREEZE) liquid 1 Container  1 Container Oral TID BM Pyrtle, Carie Caddy, MD       influenza vaccine adjuvanted (FLUAD) injection 0.5 mL  0.5 mL Intramuscular Tomorrow-1000 John Giovanni, MD       metroNIDAZOLE (FLAGYL) IVPB 500 mg  500 mg Intravenous Q12H John Giovanni, MD 100 mL/hr at 08/21/23 0907 500 mg at 08/21/23 0907   pantoprazole (PROTONIX) EC tablet 40 mg  40 mg Oral BID John Giovanni, MD       simvastatin (ZOCOR) tablet 10 mg  10 mg Oral q1800 John Giovanni, MD       sodium chloride flush (NS) 0.9 % injection 10-40 mL  10-40 mL Intracatheter Q12H Ghimire, Werner Lean, MD       sodium chloride flush (NS) 0.9 % injection 10-40 mL  10-40 mL Intracatheter PRN Ghimire, Werner Lean, MD       tamsulosin (FLOMAX) capsule 0.4 mg  0.4 mg Oral QPC supper John Giovanni, MD       traMADol Janean Sark) tablet 50 mg  50 mg Oral QHS John Giovanni, MD   50 mg at 08/20/23 2217    Allergies as of 08/20/2023   (No Known Allergies)    Family History  Problem Relation Age of Onset   Heart failure Father 72   Hypertension Father    Arthritis Father    Anemia Mother        needed regular transfusions   Prostate cancer Son    Cancer Neg Hx    Diabetes Neg Hx    Colon cancer Neg Hx    Rectal cancer Neg Hx    Stomach cancer Neg Hx    Inflammatory bowel disease Neg Hx    Liver  disease Neg Hx    Pancreatic cancer Neg Hx     Social History   Socioeconomic History  Marital status: Married    Spouse name: Not on file   Number of children: 1   Years of education: Not on file   Highest education level: Not on file  Occupational History   Occupation: farmer  Tobacco Use   Smoking status: Former    Current packs/day: 0.00    Average packs/day: 1.5 packs/day for 10.0 years (15.0 ttl pk-yrs)    Types: Cigarettes    Start date: 05/15/1966    Quit date: 05/15/1976    Years since quitting: 47.2   Smokeless tobacco: Current    Types: Chew   Tobacco comments:    chews tobacco  Vaping Use   Vaping status: Never Used  Substance and Sexual Activity   Alcohol use: Yes    Comment: rare   Drug use: No   Sexual activity: Not on file  Other Topics Concern   Not on file  Social History Narrative   Lives with wife   Occ: farm   Edu: HS   Activity: active on farm   Diet: good water, fruits/vegetables daily   Social Determinants of Health   Financial Resource Strain: Low Risk  (08/22/2022)   Overall Financial Resource Strain (CARDIA)    Difficulty of Paying Living Expenses: Not hard at all  Food Insecurity: No Food Insecurity (08/20/2023)   Hunger Vital Sign    Worried About Running Out of Food in the Last Year: Never true    Ran Out of Food in the Last Year: Never true  Transportation Needs: No Transportation Needs (08/20/2023)   PRAPARE - Administrator, Civil Service (Medical): No    Lack of Transportation (Non-Medical): No  Physical Activity: Not on file  Stress: No Stress Concern Present (08/22/2022)   [employer] of Occupational Health - Occupational Stress Questionnaire    Feeling of Stress : Not at all  Social Connections: Socially Integrated (08/22/2022)   Social Connection and Isolation Panel [NHANES]    Frequency of Communication with Friends and Family: More than three times a week    Frequency of Social Gatherings with  Friends and Family: More than three times a week    Attends Religious Services: More than 4 times per year    Active Member of [employer] or Organizations: Yes    Attends Engineer, structural: More than 4 times per year    Marital Status: Married  Catering manager Violence: Not At Risk (08/20/2023)   Humiliation, Afraid, Rape, and Kick questionnaire    Fear of Current or Ex-Partner: No    Emotionally Abused: No    Physically Abused: No    Sexually Abused: No    Review of Systems: Pertinent positive and negative review of systems were noted in the above HPI section.  All other review of systems was otherwise negative.   Physical Exam: Vital signs in last 24 hours: Temp:  [97.9 F (36.6 C)-98.6 F (37 C)] 98 F (36.7 C) (10/29 0400) Pulse Rate:  [66-102] 66 (10/29 0400) Resp:  [14-20] 15 (10/29 0400) BP: (100-147)/(54-84) 100/58 (10/29 0400) SpO2:  [95 %-100 %] 95 % (10/29 0400) Last BM Date : 08/20/23 General:   Alert,  Well-developed, well-nourished, elderly white male pleasant and cooperative in NAD Head:  Normocephalic and atraumatic. Eyes:  Sclera clear, no icterus.   Conjunctiva pink. Ears:  Normal auditory acuity. Nose:  No deformity, discharge,  or lesions. Mouth:  No deformity or lesions.   Neck:  Supple; no masses or thyromegaly. Lungs:  Clear throughout to auscultation.   No wheezes, crackles, or rhonchi.  Heart:  Regular rate and rhythm; no murmurs, clicks, rubs,  or gallops. Abdomen:  Soft, there is some mild tenderness in the left mid left upper and hypogastrium, no guarding or rebound, BS active,nonpalp mass or hsm.  No bruit heard Rectal: Not done Msk:  Symmetrical without gross deformities. . Pulses:  Normal pulses noted. Extremities:  Without clubbing or edema. Neurologic:  Alert and  oriented x4;  grossly normal neurologically. Skin:  Intact without significant lesions or rashes.. Psych:  Alert and cooperative. Normal mood and affect.  Intake/Output  from previous day: No intake/output data recorded. Intake/Output this shift: No intake/output data recorded.  Lab Results: Recent Labs    08/20/23 1902 08/20/23 2226 08/21/23 0409  WBC 13.7* 15.4* 14.0*  HGB 12.7* 13.5 12.2*  HCT 39.6 41.2 37.3*  PLT 197 213 215   BMET Recent Labs    08/20/23 1212  NA 139  K 3.8  CL 109  CO2 21*  GLUCOSE 118*  BUN 23  CREATININE 1.06  CALCIUM 8.6*   LFT Recent Labs    08/20/23 1212  PROT 6.0*  ALBUMIN 3.8  AST 20  ALT 18  ALKPHOS 55  BILITOT 0.5   PT/INR No results for input(s): "LABPROT", "INR" in the last 72 hours. Hepatitis Panel No results for input(s): "HEPBSAG", "HCVAB", "HEPAIGM", "HEPBIGM" in the last 72 hours.  IMPRESSION:  #30 82 year old white male with acute onset of abdominal pain/pain on Friday, 08/17/2023 followed by bloody diarrhea persisted for 48 hours prior to admission Hemoglobin normal on admission, leukocytosis present  CT shows an acute left-sided colitis/long segment  Most likely this is an acute infectious colitis, cannot rule out segmental ischemia-small vessel  Has improved since admission, diarrhea is very small-volume and bleeding is resolving  #2 [place] satiety and weight loss over the past 12 to 18 months of almost 35 pounds EGD 2020 unremarkable Rule out idiopathic gastroparesis No evidence for other underlying malignancy on contrasted CT this admission. 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  Hypertension 6 hyperlipidemia    PLAN: Advance to regular diet today Need to collect the stool specimens today for GI path panel and stool for C. difficile quick screen Agree with empiric Rocephin and metronidazole for now If stool studies are negative for infectious etiology may  need to consider flexible sigmoidoscopy We may also need to consider repeat EGD, not necessarily as an inpatient for further evaluation of the [place] satiety, and outpatient gastric emptying scan.  GI will follow with you    Saxon Barich EsterwoodPA-C  08/21/2023, 2:48 PM

## 2023-08-21 NOTE — Plan of Care (Signed)

## 2023-08-21 NOTE — Evaluation (Signed)
Physical Therapy Evaluation Patient Details Name: Adrian Davis MRN: [id number] DOB: Oct 31, 1940 Today's Date: 08/21/2023  History of Present Illness  Pt is 82 yo male admitted on 08/20/23 with 3 bloody Bms and near syncopal episode. CT abdomen showed L sided colitis.  Pt with hx including but not limited to HTN, HLD, GERD, BPH, back pain, R THA, L TKA  Clinical Impression  Pt admitted with above diagnosis.  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  He does have chronic back pain that limits at times, has tried outpt PT without success, is followed by neurosurgery outpt.  Today, pt transferring and ambulating at supervision for lines level.  He demonstrated steady balance, good strength, and orthostatic blood pressures where stable.  Pt has no further skilled PT needs.         If plan is discharge home, recommend the following:     Can travel by private vehicle        Equipment Recommendations None recommended by PT  Recommendations for Other Services       Functional Status Assessment Patient has not had a recent decline in their functional status     Precautions / Restrictions Precautions Precautions: None      Mobility  Bed Mobility Overal bed mobility: Independent                  Transfers Overall transfer level: Needs assistance Equipment used: None Transfers: Sit to/from Stand Sit to Stand: Supervision           General transfer comment: supervision for lines    Ambulation/Gait Ambulation/Gait assistance: Supervision Gait Distance (Feet): 200 Feet Assistive device: None Gait Pattern/deviations: WFL(Within Functional Limits) Gait velocity: normal     General Gait Details: supervision for lines; slight flexion at hips  Stairs            Wheelchair Mobility     Tilt Bed    Modified Rankin (Stroke Patients Only)       Balance Overall balance assessment: Independent Sitting-balance support: No upper  extremity supported Sitting balance-Leahy Scale: Normal     Standing balance support: No upper extremity supported Standing balance-Leahy Scale: Good                               Pertinent Vitals/Pain Pain Assessment Pain Assessment: No/denies pain    Home Living Family/patient expects to be discharged to:: Private residence Living Arrangements: Spouse/significant other Available Help at Discharge: Family;Available 24 hours/day Type of Home: House Home Access: Stairs to enter Entrance Stairs-Rails: None Entrance Stairs-Number of Steps: 3   Home Layout: Multi-level;Able to live on main level with bedroom/bathroom Home Equipment: Rolling Walker (2 wheels);BSC/3in1      Prior Function Prior Level of Function : Independent/Modified Independent;Driving             Mobility Comments: could ambulate in community without AD- does report limited at times due to back pain that radiates (has tried outpt PT, reports putting off sx) ADLs Comments: independent adls and iadls     Extremity/Trunk Assessment   Upper Extremity Assessment Upper Extremity Assessment: Overall WFL for tasks assessed    Lower Extremity Assessment Lower Extremity Assessment: Overall WFL for tasks assessed    Cervical / Trunk Assessment Cervical / Trunk Assessment: Normal  Communication      Cognition Arousal: Alert Behavior During Therapy: WFL for tasks assessed/performed Overall Cognitive Status: Within  Functional Limits for tasks assessed                                          General Comments General comments (skin integrity, edema, etc.): BP as follows: supine 116/71, sit 113/68, stand 114/71    Exercises     Assessment/Plan    PT Assessment Patient does not need any further PT services  PT Problem List         PT Treatment Interventions      PT Goals (Current goals can be found in the Care Plan section)  Acute Rehab PT Goals Patient Stated Goal:  return home PT Goal Formulation: All assessment and education complete, DC therapy    Frequency       Co-evaluation               AM-PAC PT "6 Clicks" Mobility  Outcome Measure Help needed turning from your back to your side while in a flat bed without using bedrails?: None Help needed moving from lying on your back to sitting on the side of a flat bed without using bedrails?: None Help needed moving to and from a bed to a chair (including a wheelchair)?: None Help needed standing up from a chair using your arms (e.g., wheelchair or bedside chair)?: None Help needed to walk in hospital room?: None Help needed climbing 3-5 steps with a railing? : A Little 6 Click Score: 23    End of Session   Activity Tolerance: Patient tolerated treatment well Patient left: in bed;with call bell/phone within reach Nurse Communication: Mobility status      Time: 8295-6213 PT Time Calculation (min) (ACUTE ONLY): 21 min   Charges:   PT Evaluation $PT Eval Low Complexity: 1 Low   PT General Charges $$ ACUTE PT VISIT: 1 Visit         Anise Salvo, PT Acute Rehab Services [facility] [phone]   Rayetta Humphrey 08/21/2023, 2:59 PM

## 2023-08-21 NOTE — Progress Notes (Addendum)
PROGRESS NOTE        PATIENT DETAILS Name: Adrian Davis Age: 82 y.o. Sex: male Date of Birth: 04-13-1941 Admit Date: 08/20/2023 Admitting Physician John Giovanni, MD ZOX:[id number], Wynona Canes, MD  Brief Summary: Patient is a 82 y.o.  male with history of HTN, HLD, GERD, BPH-who presented with lower abdominal pain and bloody diarrhea-CT abdomen showed left-sided colitis-he was started on empiric antibiotics and admitted to the hospitalist service.  Significant events: 10/28>> admit to [facility]  Significant studies: 10/28>> CT abdomen/pelvis: Long segment of left-sided  colitis.  Significant microbiology data: 10/28>> blood culture: Pending  Procedures: None  Consults: GI  Subjective: No abdominal pain-had 3 bloody bowel movements overnight-diarrhea seems to be slowing down per patient.  Objective: Vitals: Blood pressure (!) 100/58, pulse 66, temperature 98 F (36.7 C), temperature source Axillary, resp. rate 15, height 5' 7.5" (1.715 m), weight 77 kg, SpO2 95%.   Exam: Gen Exam:Alert awake-not in any distress HEENT:atraumatic, normocephalic Chest: B/L clear to auscultation anteriorly CVS:S1S2 regular Abdomen:soft non tender, non distended Extremities:no edema Neurology: Non focal Skin: no rash  Pertinent Labs/Radiology:    Latest Ref Rng & Units 08/21/2023    4:09 AM 08/20/2023   10:26 PM 08/20/2023    7:02 PM  CBC  WBC 4.0 - 10.5 K/uL 14.0  15.4  13.7   Hemoglobin 13.0 - 17.0 g/dL 81.1  91.4  78.2   Hematocrit 39.0 - 52.0 % 37.3  41.2  39.6   Platelets 150 - 400 K/uL 215  213  197     Lab Results  Component Value Date   NA 139 08/20/2023   K 3.8 08/20/2023   CL 109 08/20/2023   CO2 21 (L) 08/20/2023      Assessment/Plan: Left-sided colitis ?  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  BMI: Estimated body mass index is 26.19 kg/m as calculated from the following:   Height as of this encounter: 5' 7.5" (1.715 m).   Weight as of this encounter: 77 kg.   Code status:   Code Status: Full Code   DVT Prophylaxis: SCDs Start: 08/20/23 2042   Family Communication: None at bedside   Disposition Plan: Status is: Observation The patient will require care spanning > 2 midnights and should be moved to inpatient because: Severity of illness   Planned Discharge Destination:Home   Diet: Diet Order             Diet NPO time specified  Diet effective midnight                     Antimicrobial agents: Anti-infectives (From admission, onward)    Start     Dose/Rate Route Frequency Ordered Stop   08/20/23 2100  metroNIDAZOLE (FLAGYL) IVPB 500 mg        500 mg 100 mL/hr over 60 Minutes Intravenous Every 12 hours 08/20/23  2045     08/20/23 2045  cefTRIAXone (ROCEPHIN) 2 g in sodium chloride 0.9 % 100 mL IVPB        2 g 200 mL/hr over 30 Minutes Intravenous Every 24 hours 08/20/23 2044          MEDICATIONS: Scheduled Meds:  influenza vaccine adjuvanted  0.5 mL Intramuscular Tomorrow-1000   pantoprazole  40 mg Oral BID   simvastatin  10 mg Oral q1800   tamsulosin  0.4 mg Oral QPC supper   traMADol  50 mg Oral QHS   Continuous Infusions:  cefTRIAXone (ROCEPHIN)  IV 2 g (08/20/23 2216)   metronidazole 500 mg (08/20/23 2251)   PRN Meds:.acetaminophen **OR** acetaminophen   I have personally reviewed following labs  and imaging studies  LABORATORY DATA: CBC: Recent Labs  Lab 08/20/23 1050 08/20/23 1902 08/20/23 2226 08/21/23 0409  WBC 14.3* 13.7* 15.4* 14.0*  NEUTROABS  --  12.0*  --   --   HGB 13.3 12.7* 13.5 12.2*  HCT 41.0 39.6 41.2 37.3*  MCV 98.1 95.7 94.7 95.2  PLT 133* 197 213 215    Basic Metabolic Panel: Recent Labs  Lab 08/20/23 1212  NA 139  K 3.8  CL 109  CO2 21*  GLUCOSE 118*  BUN 23  CREATININE 1.06  CALCIUM 8.6*    GFR: Estimated Creatinine Clearance: 52 mL/min (by C-G formula based on SCr of 1.06 mg/dL).  Liver Function Tests: Recent Labs  Lab 08/20/23 1212  AST 20  ALT 18  ALKPHOS 55  BILITOT 0.5  PROT 6.0*  ALBUMIN 3.8   Recent Labs  Lab 08/20/23 1212  LIPASE 30   No results for input(s): "AMMONIA" in the last 168 hours.  Coagulation Profile: No results for input(s): "INR", "PROTIME" in the last 168 hours.  Cardiac Enzymes: No results for input(s): "CKTOTAL", "CKMB", "CKMBINDEX", "TROPONINI" in the last 168 hours.  BNP (last 3 results) No results for input(s): "PROBNP" in the last 8760 hours.  Lipid Profile: No results for input(s): "CHOL", "HDL", "LDLCALC", "TRIG", "CHOLHDL", "LDLDIRECT" in the last 72 hours.  Thyroid Function Tests: No results for input(s): "TSH", "T4TOTAL", "FREET4", "T3FREE", "THYROIDAB" in the last 72 hours.  Anemia Panel: No results for input(s): "VITAMINB12", "FOLATE", "FERRITIN", "TIBC", "IRON", "RETICCTPCT" in the last 72 hours.  Urine analysis:    Component Value Date/Time   COLORURINE YELLOW 08/20/2023 1441   APPEARANCEUR CLEAR 08/20/2023 1441   LABSPEC 1.013 08/20/2023 1441   PHURINE 5.0 08/20/2023 1441   GLUCOSEU NEGATIVE 08/20/2023 1441   HGBUR NEGATIVE 08/20/2023 1441   BILIRUBINUR NEGATIVE 08/20/2023 1441   BILIRUBINUR negative 11/11/2021 1106   KETONESUR NEGATIVE 08/20/2023 1441   PROTEINUR NEGATIVE 08/20/2023 1441   UROBILINOGEN 0.2 11/11/2021 1106   UROBILINOGEN 0.2 11/12/2010 2125    NITRITE NEGATIVE 08/20/2023 1441   LEUKOCYTESUR NEGATIVE 08/20/2023 1441    Sepsis Labs: Lactic Acid, Venous No results found for: "LATICACIDVEN"  MICROBIOLOGY: No results found for this or any previous visit (from the past 240 hour(s)).  RADIOLOGY STUDIES/RESULTS: CT ABDOMEN PELVIS W CONTRAST  Result Date: 08/20/2023 CLINICAL DATA:  Acute abdominal pain. EXAM: CT ABDOMEN AND PELVIS WITH CONTRAST TECHNIQUE: Multidetector CT imaging of the abdomen and pelvis was performed using the standard protocol following bolus administration of intravenous contrast. RADIATION DOSE REDUCTION: This exam was performed according to the departmental dose-optimization program which includes automated exposure control, adjustment of the mA and/or kV according to patient size and/or use of iterative reconstruction technique. CONTRAST:  75mL  OMNIPAQUE IOHEXOL 350 MG/ML SOLN COMPARISON:  CT 11/04/2018 FINDINGS: Lower chest: There is some linear opacity lung bases likely scar or atelectasis. No pleural effusion. Coronary artery calcifications are seen. Hepatobiliary: Fatty liver infiltration. Patent portal vein. Gallbladder is present. Pancreas: Unremarkable. No pancreatic ductal dilatation or surrounding inflammatory changes. Spleen: Normal in size without focal abnormality.  Small splenule. Adrenals/Urinary Tract: Adrenal glands are preserved. No collecting system dilatation. The ureters have normal course and caliber extending down to the bladder. Several bilateral renal cystic foci identified. 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 One of the larger foci exophytic from the lower pole on the left has a diameter of 6.9 cm with a Hounsfield unit of 15 and would be a Bosniak 1 lesion. There is a Bosniak 2 lesion for example exophytic medial from the right kidney measuring 3.5 cm in diameter on series 3, image 30 with a thin septation  with calcification. Bosniak 2 lesion. Stomach/Bowel: There is a long segment of wall thickening with stranding along the descending, sigmoid colon and rectum consistent with a long segment colitis. More slight changes along the transverse colon in the ascending colon appears more normal. No bowel obstruction or dilatation of the colon. Normal appendix in the right hemipelvis. On this non oral contrast exam the stomach is relatively decompressed. Small bowel is nondilated. Vascular/Lymphatic: Normal caliber IVC with scattered arterial vascular calcifications. Areas of stenosis along the renal arteries. There is also a second there appearance to the abdominal aorta with enlargement overall of 2.2 x 3.1 cm. This has slightly increasing from the study of 2020. The saccular component on series 6, image 50 of the coronal data set would measure proximally 16 mm. No specific abnormal lymph node enlargement identified in the abdomen and pelvis. Reproductive: Prominent prostate with mass effect along the base of the bladder. Other: No free intra-abdominal air. Slight mesenteric stranding. Trace simple free fluid in the pelvis. Portions of the pelvis are obscured by the streak artifact related to patient's right hip arthroplasty. Musculoskeletal: Curvature of the spine with moderate degenerative changes. There is moderate degenerative changes of the left hip. Right hip arthroplasty. IMPRESSION: Long segment left-sided colonic wall thickening with stranding and vascular engorgement consistent with a colitis. No obstruction or free air. Simple trace free fluid in the pelvis. 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 Diameter of the aorta approaches up to 3.1 cm. The saccular component measures up to 16 mm. Recommend referral to or continued care with vascular specialist. (Ref.: J Vasc Surg. 2018; 67:2-77 and J Am Coll Radiol 2013;10(10):789-794.) Electronically Signed   By: Karen Kays M.D.    On: 08/20/2023 18:15     LOS: 0 days   Jeoffrey Massed, MD  [facility]    To contact the attending provider between 7A-7P or the covering provider during after hours 7P-7A, please log into the web site [email or web address] and access using universal [place] password for that web site. If you do not have the password, please call the hospital operator.  08/21/2023, 8:55 AM

## 2023-08-22 ENCOUNTER — Telehealth: Payer: Self-pay

## 2023-08-22 DIAGNOSIS — K529 Noninfective gastroenteritis and colitis, unspecified: Secondary | ICD-10-CM | POA: Diagnosis not present

## 2023-08-22 DIAGNOSIS — A0472 Enterocolitis due to Clostridium difficile, not specified as recurrent: Secondary | ICD-10-CM

## 2023-08-22 DIAGNOSIS — R55 Syncope and collapse: Secondary | ICD-10-CM | POA: Diagnosis not present

## 2023-08-22 DIAGNOSIS — E7800 Pure hypercholesterolemia, unspecified: Secondary | ICD-10-CM | POA: Diagnosis not present

## 2023-08-22 DIAGNOSIS — G8929 Other chronic pain: Secondary | ICD-10-CM | POA: Diagnosis not present

## 2023-08-22 HISTORY — DX: Enterocolitis due to Clostridium difficile, not specified as recurrent: A04.72

## 2023-08-22 LAB — BASIC METABOLIC PANEL
Anion gap: 6 (ref 5–15)
BUN: 12 mg/dL (ref 8–23)
CO2: 24 mmol/L (ref 22–32)
Calcium: 8.3 mg/dL — ABNORMAL LOW (ref 8.9–10.3)
Chloride: 106 mmol/L (ref 98–111)
Creatinine, Ser: 0.89 mg/dL (ref 0.61–1.24)
GFR, Estimated: >60 mL/min (ref >60)
Glucose, Bld: 93 mg/dL (ref 70–99)
Potassium: 3.3 mmol/L — ABNORMAL LOW (ref 3.5–5.1)
Sodium: 136 mmol/L (ref 135–145)

## 2023-08-22 LAB — CBC
HCT: 34.4 % — ABNORMAL LOW (ref 39.0–52.0)
Hemoglobin: 11.1 g/dL — ABNORMAL LOW (ref 13.0–17.0)
MCH: 31.2 pg (ref 26.0–34.0)
MCHC: 32.3 g/dL (ref 30.0–36.0)
MCV: 96.6 fL (ref 80.0–100.0)
Platelets: 188 10*3/uL (ref 150–400)
RBC: 3.56 MIL/uL — ABNORMAL LOW (ref 4.22–5.81)
RDW: 13.7 % (ref 11.5–15.5)
WBC: 11.4 10*3/uL — ABNORMAL HIGH (ref 4.0–10.5)
nRBC: 0 % (ref 0.0–0.2)

## 2023-08-22 LAB — GASTROINTESTINAL PANEL BY PCR, STOOL (REPLACES STOOL CULTURE)

## 2023-08-22 MED ORDER — POTASSIUM CHLORIDE CRYS ER 20 MEQ PO TBCR
40.0000 meq | EXTENDED_RELEASE_TABLET | Freq: Once | ORAL | Status: AC
  Administered 2023-08-22: 40 meq via ORAL
  Filled 2023-08-22: qty 2

## 2023-08-22 MED ORDER — VANCOMYCIN HCL 125 MG PO CAPS
125.0000 mg | ORAL_CAPSULE | Freq: Four times a day (QID) | ORAL | Status: DC
  Administered 2023-08-22 – 2023-08-23 (×5): 125 mg via ORAL
  Filled 2023-08-22 (×5): qty 1

## 2023-08-22 MED ORDER — LACTATED RINGERS IV SOLN: INTRAVENOUS | Status: AC

## 2023-08-22 NOTE — Evaluation (Signed)
Occupational Therapy Evaluation Patient Details Name: Adrian Davis MRN: [id number] DOB: December 23, 1940 Today's Date: 08/22/2023   History of Present Illness Pt is 82 yo male admitted on 08/20/23 with 3 bloody Bms and near syncopal episode. CT abdomen showed L sided colitis.  Pt with hx including but not limited to HTN, HLD, GERD, BPH, back pain, R THA, L TKA   Clinical Impression   PTA, pt lived with wife and was mod I for ADL, IADL, working, and driving. Upon eval, pt independent in ADL, able to retrieve items from floor, perform shower transfers and path finding. Reviewed compensatory techniques to reduce back and stomach pain. No further acute needs identified. OT to sign off. Thank you for this order, please re-consult if change in status.        If plan is discharge home, recommend the following: Other (comment) (on pt request)    Functional Status Assessment  Patient has had a recent decline in their functional status and demonstrates the ability to make significant improvements in function in a reasonable and predictable amount of time.  Equipment Recommendations  None recommended by OT    Recommendations for Other Services       Precautions / Restrictions Precautions Precautions: None Restrictions Weight Bearing Restrictions: No      Mobility Bed Mobility Overal bed mobility: Independent                  Transfers Overall transfer level: Modified independent                        Balance Overall balance assessment: Modified Independent                                         ADL either performed or assessed with clinical judgement   ADL Overall ADL's : Independent                                             Vision Patient Visual Report: No change from baseline       Perception         Praxis         Pertinent Vitals/Pain Pain Assessment Pain Assessment: No/denies pain     Extremity/Trunk  Assessment Upper Extremity Assessment Upper Extremity Assessment: Overall WFL for tasks assessed   Lower Extremity Assessment Lower Extremity Assessment: Overall WFL for tasks assessed   Cervical / Trunk Assessment Cervical / Trunk Assessment: Normal   Communication Communication Communication: No apparent difficulties   Cognition Arousal: Alert Behavior During Therapy: WFL for tasks assessed/performed Overall Cognitive Status: Within Functional Limits for tasks assessed                                       General Comments       Exercises     Shoulder Instructions      Home Living Family/patient expects to be discharged to:: Private residence Living Arrangements: Spouse/significant other Available Help at Discharge: Family;Available 24 hours/day Type of Home: House Home Access: Stairs to enter [employer] of Steps: 3 Entrance Stairs-Rails: None Home Layout: Multi-level;Able to live on main level with bedroom/bathroom  Bathroom Shower/Tub: Runner, broadcasting/film/video: Agricultural consultant (2 wheels);BSC/3in1          Prior Functioning/Environment Prior Level of Function : Independent/Modified Independent;Driving             Mobility Comments: could ambulate in community without AD- does report limited at times due to back pain that radiates (has tried outpt PT, reports putting off sx) ADLs Comments: independent adls and iadls        OT Problem List: Decreased strength;Decreased activity tolerance;Impaired balance (sitting and/or standing)      OT Treatment/Interventions:      OT Goals(Current goals can be found in the care plan section) Acute Rehab OT Goals Patient Stated Goal: get better and go home OT Goal Formulation: With patient Time For Goal Achievement: 09/05/23 Potential to Achieve Goals: Good  OT Frequency:      Co-evaluation              AM-PAC OT "6 Clicks" Daily Activity     Outcome Measure  Help from another person eating meals?: None Help from another person taking care of personal grooming?: None Help from another person toileting, which includes using toliet, bedpan, or urinal?: None Help from another person bathing (including washing, rinsing, drying)?: None Help from another person to put on and taking off regular upper body clothing?: None Help from another person to put on and taking off regular lower body clothing?: None 6 Click Score: 24   End of Session Nurse Communication: Mobility status  Activity Tolerance: Patient tolerated treatment well Patient left: in bed;with call bell/phone within reach  OT Visit Diagnosis: Unsteadiness on feet (R26.81)                Time: 8657-8469 OT Time Calculation (min): 21 min Charges:  OT General Charges $OT Visit: 1 Visit OT Evaluation $OT Eval Low Complexity: 1 Low  Tyler Deis, OTR/L [facility] Acute Rehabilitation Office: [phone]   Myrla Halsted 08/22/2023, 5:22 PM

## 2023-08-22 NOTE — Progress Notes (Signed)
Mobility Specialist Progress Note;   08/22/23 1600  Mobility  Activity Ambulated with assistance in hallway  Level of Assistance Standby assist, set-up cues, supervision of patient - no hands on  Assistive Device Other (Comment) (IV pole)  Distance Ambulated (ft) 325 ft  Activity Response Tolerated well  Mobility Referral Yes  $Mobility charge 1 Mobility  Mobility Specialist Start Time (ACUTE ONLY) 1600  Mobility Specialist Stop Time (ACUTE ONLY) 1620  Mobility Specialist Time Calculation (min) (ACUTE ONLY) 20 min   Pt agreeable to mobility with encouragement. Required no physical assistance during ambulation, SV. Asx throughout and no c/o during session. Pt back in bed with all needs met. Wife in room.   Caesar Bookman Mobility Specialist Please contact via SecureChat or Rehab Office [phone]

## 2023-08-22 NOTE — Telephone Encounter (Signed)
Dr Mansouraty's first available appt is 10/26/23 at 930 am. Letter mailed to the home with that appt information.

## 2023-08-22 NOTE — Progress Notes (Signed)
PROGRESS NOTE        PATIENT DETAILS Name: Adrian Davis Age: 82 y.o. Sex: male Date of Birth: 13-Apr-1941 Admit Date: 08/20/2023 Admitting Physician John Giovanni, MD ZOX:[id number], Wynona Canes, MD  Brief Summary: Patient is a 82 y.o.  male with history of HTN, HLD, GERD, BPH-who presented with lower abdominal pain and bloody diarrhea-CT abdomen showed left-sided colitis-he was started on empiric antibiotics and admitted to the hospitalist service.  Significant events: 10/28>> admit to [facility]  Significant studies: 10/28>> CT abdomen/pelvis: Long segment of left-sided  colitis.  Significant microbiology data: 10/28>> blood culture: No growth 10/28>> GI pathogen panel: Negative 10/28>> stool C. difficile study: Positive antigen -negative toxin  Procedures: None  Consults: GI  Subjective: Some mild abdominal pain last night-diarrhea persist but no longer bloody-apparently had 10-12 loose stools overnight.  No recent antibiotic exposure.  Objective: Vitals: Blood pressure (!) 129/57, pulse 83, temperature 98.6 F (37 C), temperature source Oral, resp. rate 16, height 5' 7.5" (1.715 m), weight 77 kg, SpO2 95%.   Exam: Gen Exam:Alert awake-not in any distress HEENT:atraumatic, normocephalic Chest: B/L clear to auscultation anteriorly CVS:S1S2 regular Abdomen:soft non tender, non distended Extremities:no edema Neurology: Non focal Skin: no rash  Pertinent Labs/Radiology:    Latest Ref Rng & Units 08/22/2023    2:29 AM 08/21/2023    4:09 AM 08/20/2023   10:26 PM  CBC  WBC 4.0 - 10.5 K/uL 11.4  14.0  15.4   Hemoglobin 13.0 - 17.0 g/dL 81.1  91.4  78.2   Hematocrit 39.0 - 52.0 % 34.4  37.3  41.2   Platelets 150 - 400 K/uL 188  215  213     Lab Results  Component Value Date   NA 136 08/22/2023   K 3.3 (L) 08/22/2023   CL 106 08/22/2023   CO2 24 08/22/2023      Assessment/Plan: Left-sided colitis ?  related to C. Difficile-stool  studies equivocal but continues to have persistent diarrhea-although no longer bloody Stop Rocephin/Flagyl-switch to oral vancomycin Gently hydrate for a few hours-at risk for dehydration. 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  BMI: Estimated body mass index is 26.19 kg/m as calculated from the following:   Height as of this encounter: 5' 7.5" (1.715 m).   Weight as of this encounter: 77 kg.   Code status:   Code Status: Full Code   DVT Prophylaxis: SCDs Start: 08/20/23 2042   Family Communication: None at bedside   Disposition Plan: Status is: Observation The patient will require care spanning > 2 midnights and should be moved to inpatient because: Severity of illness   Planned Discharge Destination:Home   Diet: Diet Order             Diet regular Room service appropriate? Yes; Fluid consistency: Thin  Diet effective now                     Antimicrobial agents: Anti-infectives (From admission, onward)  Start     Dose/Rate Route Frequency Ordered Stop   08/22/23 1000  vancomycin (VANCOCIN) capsule 125 mg        125 mg Oral 4 times daily 08/22/23 0822 09/01/23 0959   08/20/23 2100  metroNIDAZOLE (FLAGYL) IVPB 500 mg  Status:  Discontinued        500 mg 100 mL/hr over 60 Minutes Intravenous Every 12 hours 08/20/23 2045 08/22/23 0822   08/20/23 2045  cefTRIAXone (ROCEPHIN) 2 g in sodium chloride 0.9 % 100 mL IVPB  Status:  Discontinued        2 g 200 mL/hr over 30 Minutes Intravenous Every 24 hours 08/20/23 2044  08/22/23 3664        MEDICATIONS: Scheduled Meds:  Chlorhexidine Gluconate Cloth  6 each Topical Daily   feeding supplement  1 Container Oral TID BM   influenza vaccine adjuvanted  0.5 mL Intramuscular Tomorrow-1000   pantoprazole  40 mg Oral BID   simvastatin  10 mg Oral q1800   sodium chloride flush  10-40 mL Intracatheter Q12H   tamsulosin  0.4 mg Oral QPC supper   traMADol  50 mg Oral QHS   vancomycin  125 mg Oral QID   Continuous Infusions:   PRN Meds:.acetaminophen **OR** acetaminophen, sodium chloride flush   I have personally reviewed following labs and imaging studies  LABORATORY DATA: CBC: Recent Labs  Lab 08/20/23 1050 08/20/23 1902 08/20/23 2226 08/21/23 0409 08/22/23 0229  WBC 14.3* 13.7* 15.4* 14.0* 11.4*  NEUTROABS  --  12.0*  --   --   --   HGB 13.3 12.7* 13.5 12.2* 11.1*  HCT 41.0 39.6 41.2 37.3* 34.4*  MCV 98.1 95.7 94.7 95.2 96.6  PLT 133* 197 213 215 188    Basic Metabolic Panel: Recent Labs  Lab 08/20/23 1212 08/22/23 0229  NA 139 136  K 3.8 3.3*  CL 109 106  CO2 21* 24  GLUCOSE 118* 93  BUN 23 12  CREATININE 1.06 0.89  CALCIUM 8.6* 8.3*    GFR: Estimated Creatinine Clearance: 62 mL/min (by C-G formula based on SCr of 0.89 mg/dL).  Liver Function Tests: Recent Labs  Lab 08/20/23 1212  AST 20  ALT 18  ALKPHOS 55  BILITOT 0.5  PROT 6.0*  ALBUMIN 3.8   Recent Labs  Lab 08/20/23 1212  LIPASE 30   No results for input(s): "AMMONIA" in the last 168 hours.  Coagulation Profile: No results for input(s): "INR", "PROTIME" in the last 168 hours.  Cardiac Enzymes: No results for input(s): "CKTOTAL", "CKMB", "CKMBINDEX", "TROPONINI" in the last 168 hours.  BNP (last 3 results) No results for input(s): "PROBNP" in the last 8760 hours.  Lipid Profile: No results for input(s): "CHOL", "HDL", "LDLCALC", "TRIG", "CHOLHDL", "LDLDIRECT" in the last 72 hours.  Thyroid Function Tests: No results for input(s): "TSH", "T4TOTAL",  "FREET4", "T3FREE", "THYROIDAB" in the last 72 hours.  Anemia Panel: No results for input(s): "VITAMINB12", "FOLATE", "FERRITIN", "TIBC", "IRON", "RETICCTPCT" in the last 72 hours.  Urine analysis:    Component Value Date/Time   COLORURINE YELLOW 08/20/2023 1441   APPEARANCEUR CLEAR 08/20/2023 1441   LABSPEC 1.013 08/20/2023 1441   PHURINE 5.0 08/20/2023 1441   GLUCOSEU NEGATIVE 08/20/2023 1441   HGBUR NEGATIVE 08/20/2023 1441   BILIRUBINUR NEGATIVE 08/20/2023 1441   BILIRUBINUR negative 11/11/2021 1106   KETONESUR NEGATIVE 08/20/2023 1441   PROTEINUR NEGATIVE 08/20/2023 1441   UROBILINOGEN 0.2 11/11/2021 1106   UROBILINOGEN 0.2 11/12/2010 2125   NITRITE NEGATIVE 08/20/2023  1441   LEUKOCYTESUR NEGATIVE 08/20/2023 1441    Sepsis Labs: Lactic Acid, Venous No results found for: "LATICACIDVEN"  MICROBIOLOGY: Recent Results (from the past 240 hour(s))  C Difficile Quick Screen w PCR reflex     Status: Abnormal   Collection Time: 08/20/23  8:44 PM   Specimen: Stool  Result Value Ref Range Status   C Diff antigen POSITIVE (A) NEGATIVE Final   C Diff toxin NEGATIVE NEGATIVE Final   C Diff interpretation Results are indeterminate. See PCR results.  Final    Comment: Performed at [facility], [address] [place], [place] [address]  Gastrointestinal Panel by PCR , Stool     Status: None   Collection Time: 08/20/23  8:44 PM   Specimen: Stool  Result Value Ref Range Status   Campylobacter species NOT DETECTED NOT DETECTED Final   Plesimonas shigelloides NOT DETECTED NOT DETECTED Final   Salmonella species NOT DETECTED NOT DETECTED Final   Yersinia enterocolitica NOT DETECTED NOT DETECTED Final   Vibrio species NOT DETECTED NOT DETECTED Final   Vibrio cholerae NOT DETECTED NOT DETECTED Final   Enteroaggregative E coli (EAEC) NOT DETECTED NOT DETECTED Final   Enteropathogenic E coli (EPEC) NOT DETECTED NOT DETECTED Final   Enterotoxigenic E coli (ETEC) NOT DETECTED NOT  DETECTED Final   Shiga like toxin producing E coli (STEC) NOT DETECTED NOT DETECTED Final   Shigella/Enteroinvasive E coli (EIEC) NOT DETECTED NOT DETECTED Final   Cryptosporidium NOT DETECTED NOT DETECTED Final   Cyclospora cayetanensis NOT DETECTED NOT DETECTED Final   Entamoeba histolytica NOT DETECTED NOT DETECTED Final   Giardia lamblia NOT DETECTED NOT DETECTED Final   Adenovirus F40/41 NOT DETECTED NOT DETECTED Final   Astrovirus NOT DETECTED NOT DETECTED Final   Norovirus GI/GII NOT DETECTED NOT DETECTED Final   Rotavirus A NOT DETECTED NOT DETECTED Final   Sapovirus (I, II, IV, and V) NOT DETECTED NOT DETECTED Final    Comment: Performed at [facility], [address] [place], [place] [address]  C. Diff by PCR, Reflexed     Status: Abnormal   Collection Time: 08/20/23  8:44 PM  Result Value Ref Range Status   Toxigenic C. Difficile by PCR POSITIVE (A) NEGATIVE Final    Comment: Positive for toxigenic C. difficile with little to no toxin production. Only treat if clinical presentation suggests symptomatic illness. Performed at [facility], [address] [place], [place] [address]   Culture, blood (Routine X 2) w Reflex to ID Panel     Status: None (Preliminary result)   Collection Time: 08/20/23 10:26 PM   Specimen: BLOOD  Result Value Ref Range Status   Specimen Description BLOOD SITE NOT SPECIFIED  Final   Special Requests   Final    BOTTLES DRAWN AEROBIC AND ANAEROBIC Blood Culture adequate volume   Culture   Final    NO GROWTH 2 DAYS Performed at [facility], [address] [place], [place] [address]    Report Status PENDING  Incomplete  Culture, blood (Routine X 2) w Reflex to ID Panel     Status: None (Preliminary result)   Collection Time: 08/20/23 10:30 PM   Specimen: BLOOD  Result Value Ref Range Status   Specimen Description BLOOD SITE NOT SPECIFIED  Final   Special Requests   Final    BOTTLES DRAWN AEROBIC AND ANAEROBIC Blood  Culture adequate volume   Culture   Final    NO  GROWTH 2 DAYS Performed at [facility], [address] [place], [place] [address]    Report Status PENDING  Incomplete    RADIOLOGY STUDIES/RESULTS: CT ABDOMEN PELVIS W CONTRAST  Result Date: 08/20/2023 CLINICAL DATA:  Acute abdominal pain. EXAM: CT ABDOMEN AND PELVIS WITH CONTRAST TECHNIQUE: Multidetector CT imaging of the abdomen and pelvis was performed using the standard protocol following bolus administration of intravenous contrast. RADIATION DOSE REDUCTION: This exam was performed according to the departmental dose-optimization program which includes automated exposure control, adjustment of the mA and/or kV according to patient size and/or use of iterative reconstruction technique. CONTRAST:  75mL OMNIPAQUE IOHEXOL 350 MG/ML SOLN COMPARISON:  CT 11/04/2018 FINDINGS: Lower chest: There is some linear opacity lung bases likely scar or atelectasis. No pleural effusion. Coronary artery calcifications are seen. Hepatobiliary: Fatty liver infiltration. Patent portal vein. Gallbladder is present. Pancreas: Unremarkable. No pancreatic ductal dilatation or surrounding inflammatory changes. Spleen: Normal in size without focal abnormality.  Small splenule. Adrenals/Urinary Tract: Adrenal glands are preserved. No collecting system dilatation. The ureters have normal course and caliber extending down to the bladder. Several bilateral renal cystic foci identified. 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 One of the larger foci exophytic from the lower pole on the left has a diameter of 6.9 cm with a Hounsfield unit of 15 and would be a Bosniak 1 lesion. There is a Bosniak 2 lesion for example exophytic medial from the right kidney measuring 3.5 cm in diameter on series 3, image 30 with a thin septation with calcification. Bosniak 2 lesion. Stomach/Bowel: There is a  long segment of wall thickening with stranding along the descending, sigmoid colon and rectum consistent with a long segment colitis. More slight changes along the transverse colon in the ascending colon appears more normal. No bowel obstruction or dilatation of the colon. Normal appendix in the right hemipelvis. On this non oral contrast exam the stomach is relatively decompressed. Small bowel is nondilated. Vascular/Lymphatic: Normal caliber IVC with scattered arterial vascular calcifications. Areas of stenosis along the renal arteries. There is also a second there appearance to the abdominal aorta with enlargement overall of 2.2 x 3.1 cm. This has slightly increasing from the study of 2020. The saccular component on series 6, image 50 of the coronal data set would measure proximally 16 mm. No specific abnormal lymph node enlargement identified in the abdomen and pelvis. Reproductive: Prominent prostate with mass effect along the base of the bladder. Other: No free intra-abdominal air. Slight mesenteric stranding. Trace simple free fluid in the pelvis. Portions of the pelvis are obscured by the streak artifact related to patient's right hip arthroplasty. Musculoskeletal: Curvature of the spine with moderate degenerative changes. There is moderate degenerative changes of the left hip. Right hip arthroplasty. IMPRESSION: Long segment left-sided colonic wall thickening with stranding and vascular engorgement consistent with a colitis. No obstruction or free air. Simple trace free fluid in the pelvis. 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 Diameter of the aorta approaches up to 3.1 cm. The saccular component measures up to 16 mm. Recommend referral to or continued care with vascular specialist. (Ref.: J Vasc Surg. 2018; 67:2-77 and J Am Coll Radiol 2013;10(10):789-794.) Electronically Signed   By: Karen Kays M.D.   On: 08/20/2023 18:15     LOS: 1 day   Jeoffrey Massed,  MD  [facility]    To contact  the attending provider between 7A-7P or the covering provider during after hours 7P-7A, please log into the web site [email or web address] and access using universal [place] password for that web site. If you do not have the password, please call the hospital operator.  08/22/2023, 9:54 AM

## 2023-08-22 NOTE — Telephone Encounter (Signed)
-----   Message from Carie Caddy Pyrtle sent at 08/22/2023  2:52 PM EDT ----- GM pt Hospitalized with c diff colitis. Also ongoing wt loss and Damiano satiety Needs office followup with GM Likely to go home tomorrow Appt is not urgent, but hopefully within 4-6 weeks (assuming he gets better from c diff as expected) JMP

## 2023-08-22 NOTE — Progress Notes (Signed)
Progress Note   Assessment    82 year old male with a history of GERD, hypertension, hyperlipidemia, BPH and lumbar disease who is admitted with lower abdominal pain with bloody diarrhea found to have C. difficile colitis  Principal Problem:   Colitis Active Problems:   Hyperlipidemia   GERD (gastroesophageal reflux disease)   Abdominal aortic aneurysm (AAA) (HCC)   Chronic pain   Recommendations   1.  C. difficile colitis/bloody diarrhea --C. difficile toxin PCR is positive, fits with the clinical picture.  Started on oral vancomycin today.  White count trending down. -- Vancomycin oral 125 mg 4 times daily x 14 days -- Continue with advance diet -- No role for colonoscopy or flexible sigmoidoscopy at this time  2.  Weight loss/Hanneman satiety --I recommended that he follow-up with [facility] after discharge, Dr. Meridee Score, to further evaluate the symptoms though they have been present to some extent since 2020 based on the indication for EGD at that time.  Dr. Meridee Score can make the decision as to whether he would wish to repeat the EGD as an outpatient  Assuming that his diarrhea is better tomorrow and he is feeling better I anticipate he can go home We will arrange outpatient GI follow-up with Dr. Meridee Score Would plan 14 days of oral vancomycin for C. difficile infection Patient advised and should be reminded at discharge to contact our office even before follow-up if symptoms fail to normalize after vancomycin therapy.  Call if questions.    Chief Complaint   C. difficile PCR positive, oral vancomycin started this morning Patient has been up and walking around Some fecal urgency and gas but less diarrhea Able to eat breakfast this morning without vomiting or nausea  Vital signs in last 24 hours: Temp:  [98.2 F (36.8 C)-99.1 F (37.3 C)] 98.6 F (37 C) (10/30 0814) Pulse Rate:  [61-85] 83 (10/30 0814) Resp:  [15-19] 16 (10/30 0814) BP: (123-137)/(57-68)  129/57 (10/30 0814) SpO2:  [93 %-97 %] 95 % (10/30 0400) Last BM Date : 08/20/23 Gen: awake, alert, NAD HEENT: anicteric  CV: RRR, no mrg Pulm: CTA b/l Abd: soft, NT/ND, +BS throughout Ext: no c/c/e Neuro: nonfocal   Intake/Output from previous day: 10/29 0701 - 10/30 0700 In: 480 [P.O.:480] Out: -  Intake/Output this shift: No intake/output data recorded.  Lab Results: Recent Labs    08/20/23 2226 08/21/23 0409 08/22/23 0229  WBC 15.4* 14.0* 11.4*  HGB 13.5 12.2* 11.1*  HCT 41.2 37.3* 34.4*  PLT 213 215 188   BMET Recent Labs    08/20/23 1212 08/22/23 0229  NA 139 136  K 3.8 3.3*  CL 109 106  CO2 21* 24  GLUCOSE 118* 93  BUN 23 12  CREATININE 1.06 0.89  CALCIUM 8.6* 8.3*   LFT Recent Labs    08/20/23 1212  PROT 6.0*  ALBUMIN 3.8  AST 20  ALT 18  ALKPHOS 55  BILITOT 0.5   Studies/Results: CT ABDOMEN PELVIS W CONTRAST  Result Date: 08/20/2023 CLINICAL DATA:  Acute abdominal pain. EXAM: CT ABDOMEN AND PELVIS WITH CONTRAST TECHNIQUE: Multidetector CT imaging of the abdomen and pelvis was performed using the standard protocol following bolus administration of intravenous contrast. RADIATION DOSE REDUCTION: This exam was performed according to the departmental dose-optimization program which includes automated exposure control, adjustment of the mA and/or kV according to patient size and/or use of iterative reconstruction technique. CONTRAST:  75mL OMNIPAQUE IOHEXOL 350 MG/ML SOLN COMPARISON:  CT 11/04/2018 FINDINGS: Lower chest:  There is some linear opacity lung bases likely scar or atelectasis. No pleural effusion. Coronary artery calcifications are seen. Hepatobiliary: Fatty liver infiltration. Patent portal vein. Gallbladder is present. Pancreas: Unremarkable. No pancreatic ductal dilatation or surrounding inflammatory changes. Spleen: Normal in size without focal abnormality.  Small splenule. Adrenals/Urinary Tract: Adrenal glands are preserved. No  collecting system dilatation. The ureters have normal course and caliber extending down to the bladder. Several bilateral renal cystic foci identified. 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 One of the larger foci exophytic from the lower pole on the left has a diameter of 6.9 cm with a Hounsfield unit of 15 and would be a Bosniak 1 lesion. There is a Bosniak 2 lesion for example exophytic medial from the right kidney measuring 3.5 cm in diameter on series 3, image 30 with a thin septation with calcification. Bosniak 2 lesion. Stomach/Bowel: There is a long segment of wall thickening with stranding along the descending, sigmoid colon and rectum consistent with a long segment colitis. More slight changes along the transverse colon in the ascending colon appears more normal. No bowel obstruction or dilatation of the colon. Normal appendix in the right hemipelvis. On this non oral contrast exam the stomach is relatively decompressed. Small bowel is nondilated. Vascular/Lymphatic: Normal caliber IVC with scattered arterial vascular calcifications. Areas of stenosis along the renal arteries. There is also a second there appearance to the abdominal aorta with enlargement overall of 2.2 x 3.1 cm. This has slightly increasing from the study of 2020. The saccular component on series 6, image 50 of the coronal data set would measure proximally 16 mm. No specific abnormal lymph node enlargement identified in the abdomen and pelvis. Reproductive: Prominent prostate with mass effect along the base of the bladder. Other: No free intra-abdominal air. Slight mesenteric stranding. Trace simple free fluid in the pelvis. Portions of the pelvis are obscured by the streak artifact related to patient's right hip arthroplasty. Musculoskeletal: Curvature of the spine with moderate degenerative changes. There is moderate degenerative changes of the  left hip. Right hip arthroplasty. IMPRESSION: Long segment left-sided colonic wall thickening with stranding and vascular engorgement consistent with a colitis. No obstruction or free air. Simple trace free fluid in the pelvis. 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 Diameter of the aorta approaches up to 3.1 cm. The saccular component measures up to 16 mm. Recommend referral to or continued care with vascular specialist. (Ref.: J Vasc Surg. 2018; 67:2-77 and J Am Coll Radiol 2013;10(10):789-794.) Electronically Signed   By: Karen Kays M.D.   On: 08/20/2023 18:15      LOS: 1 day   Beverley Fiedler, MD 08/22/2023, 2:43 PM See Loretha Stapler, [facility], to contact our on call provider

## 2023-08-22 NOTE — Plan of Care (Signed)

## 2023-08-23 ENCOUNTER — Other Ambulatory Visit (HOSPITAL_COMMUNITY): Payer: Self-pay

## 2023-08-23 DIAGNOSIS — K219 Gastro-esophageal reflux disease without esophagitis: Secondary | ICD-10-CM | POA: Diagnosis not present

## 2023-08-23 DIAGNOSIS — I714 Abdominal aortic aneurysm, without rupture, unspecified: Secondary | ICD-10-CM | POA: Diagnosis not present

## 2023-08-23 DIAGNOSIS — E782 Mixed hyperlipidemia: Secondary | ICD-10-CM | POA: Diagnosis not present

## 2023-08-23 DIAGNOSIS — A0472 Enterocolitis due to Clostridium difficile, not specified as recurrent: Secondary | ICD-10-CM | POA: Diagnosis not present

## 2023-08-23 LAB — CALPROTECTIN, FECAL: Calprotectin, Fecal: 7460 ug/g — ABNORMAL HIGH (ref 0–120)

## 2023-08-23 MED ORDER — POTASSIUM CHLORIDE CRYS ER 20 MEQ PO TBCR
40.0000 meq | EXTENDED_RELEASE_TABLET | ORAL | Status: DC
  Administered 2023-08-23: 40 meq via ORAL
  Filled 2023-08-23: qty 2

## 2023-08-23 MED ORDER — VANCOMYCIN HCL 125 MG PO CAPS
125.0000 mg | ORAL_CAPSULE | Freq: Four times a day (QID) | ORAL | 0 refills | Status: AC
  Filled 2023-08-23: qty 52, 13d supply, fill #0

## 2023-08-23 NOTE — Progress Notes (Signed)
PT Cancellation Note  Patient Details Name: Adrian Davis MRN: [id number] DOB: 03/28/1941   Cancelled Treatment:    Reason Eval/Treat Not Completed: PT screened, no needs identified, will sign off  See PT eval of 10/30. Patient at baseline, independent and no further PT recommended.    Jerolyn Center, PT Acute Rehabilitation Services  Office [phone]  Zena Amos 08/23/2023, 10:41 AM

## 2023-08-23 NOTE — Progress Notes (Signed)
Patient had only 1 BM throughout the night without blood. Patient claimed that succeeding attempt to defecate is more on gas. BP has been stable and claimed of mild abdominal pain.

## 2023-08-23 NOTE — Discharge Summary (Signed)
PATIENT DETAILS Name: Adrian Davis Age: 82 y.o. Sex: male Date of Birth: Jun 24, 1941 MRN: [id number]. Admitting Physician: John Giovanni, MD [id number], Wynona Canes, MD  Admit Date: 08/20/2023 Discharge date: 08/23/2023  Recommendations for Outpatient Follow-up:  Follow up with PCP in 1-2 weeks Please obtain CMP/CBC in one week Incidental finding-saccular infrarenal aneurysm-needs vascular surgery referral.  Admitted From:  Home  Disposition: Home   Discharge Condition: good  CODE STATUS:   Code Status: Full Code   Diet recommendation:  Diet Order             Diet - low sodium heart healthy           Diet regular Room service appropriate? Yes; Fluid consistency: Thin  Diet effective now                    Brief Summary: Patient is a 82 y.o.  male with history of HTN, HLD, GERD, BPH-who presented with lower abdominal pain and bloody diarrhea-CT abdomen showed left-sided colitis-he was started on empiric antibiotics and admitted to the hospitalist service.   Significant events: 10/28>> admit to [facility]   Significant studies: 10/28>> CT abdomen/pelvis: Long segment of left-sided  colitis.   Significant microbiology data: 10/28>> blood culture: No growth 10/28>> GI pathogen panel: Negative 10/28>> stool C. difficile study: Positive antigen -negative toxin   Procedures: None   Consults: GI  Brief Hospital Course: Left-sided colitis-likely due to C. difficile Presented with diarrhea/abdominal pain-CT showed long segment left-sided colitis-stool studies positive for C. difficile-although no antibiotic exposure recently Initially treated with Rocephin and Flagyl-once stool studies resulted-switch to vancomycin-with significant clinical improvement.  Hardly any diarrhea overnight-stools no longer bloody.  Stable for discharge on oral vancomycin  Hypokalemia Mild Repeat prior to discharge-recheck in PCPs office.  HTN Blood pressure initially soft-all  antihypertensives held but BP now creeping up-resume all of his antihypertensives.  HLD Statin   BPH Flomax   GERD Stopping PPI due to C. 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   Weight as of this encounter: 77 kg.    Discharge Diagnoses:  Principal Problem:   Colitis Active Problems:   Hyperlipidemia   GERD (gastroesophageal reflux disease)   Abdominal aortic aneurysm (AAA) (HCC)   Chronic pain   C. difficile colitis   Discharge Instructions:  Activity:  As tolerated   Discharge Instructions     Ambulatory referral to Vascular Surgery   Complete by: As directed    Call MD for:  extreme fatigue   Complete by: As directed    Call MD for:  persistant dizziness or light-headedness   Complete by: As directed    Call MD for:  persistant nausea and vomiting   Complete by: As directed    Diet - low sodium heart healthy   Complete by: As directed    Discharge instructions   Complete by: As directed    Follow with Primary MD  Eustaquio Boyden, MD in 1-2 weeks  Incidental finding-you were found to have a aneurysm in your aorta-you will need a referral from your primary care practitioner to vascular surgery.  This aneurysm will need repeat imaging/monitoring-if it increases significantly-you might even require surgery.  Please get a  complete blood count and chemistry panel checked by your Primary MD at your next visit, and again as instructed by your Primary MD.  Get Medicines reviewed and adjusted: Please take all your medications with you for your next visit with your Primary MD  Laboratory/radiological data: Please request your Primary MD to go over all hospital tests and  procedure/radiological results at the follow up, please ask your Primary MD to get all Hospital records sent to his/her office.  In some cases, they will be blood work, cultures and biopsy results pending at the time of your discharge. Please request that your primary care M.D. follows up on these results.  Also Note the following: If you experience worsening of your admission symptoms, develop shortness of breath, life threatening emergency, suicidal or homicidal thoughts you must seek medical attention immediately by calling 911 or calling your MD immediately  if symptoms less severe.  You must read complete instructions/literature along with all the possible adverse reactions/side effects for all the Medicines you take and that have been prescribed to you. Take any new Medicines after you have completely understood and accpet all the possible adverse reactions/side effects.   Do not drive when taking Pain medications or sleeping medications (Benzodaizepines)  Do not take more than prescribed Pain, Sleep and Anxiety Medications. It is not advisable to combine anxiety,sleep and pain medications without talking with your primary care practitioner  Special Instructions: If you have smoked or chewed Tobacco  in the last 2 yrs please stop smoking, stop any regular Alcohol  and or any Recreational drug use.  Wear Seat belts while driving.  Please note: You were cared for by a hospitalist during your hospital stay. Once you are discharged, your primary care physician will handle any further medical issues. Please note that NO REFILLS for any discharge medications will be authorized once you are discharged, as it is imperative that you return to your primary care physician (or establish a relationship with a primary care physician if you do not have one) for your post hospital discharge needs so that they can reassess your need for medications and monitor your lab values.   Increase activity slowly    Complete by: As directed       Allergies as of 08/23/2023   No Known Allergies      Medication List     STOP taking these medications    ciclopirox 8 % solution Commonly known as: PENLAC   pantoprazole 40 MG tablet Commonly known as: PROTONIX   thiamine 100 MG tablet Commonly known as: VITAMIN B1       TAKE these medications    acetaminophen 650 MG CR tablet Commonly known as: TYLENOL Take 650 mg by mouth 3 (three) times daily.   allopurinol 100 MG tablet Commonly known as: ZYLOPRIM Take 1 tablet (100 mg total) by mouth daily.   amLODipine 2.5 MG tablet Commonly known as: NORVASC TAKE 2 TABLETS BY MOUTH EVERY MORNING AND 1 TABLET IN THE AFTERNOON AS DIRECTED What changed: See the new instructions.   aspirin 81 MG chewable tablet Chew 81 mg by mouth every evening.   benazepril 20 MG tablet Commonly known as: LOTENSIN TAKE ONE TABLET BY MOUTH TWO TIMES DAILY   colchicine 0.6 MG tablet Take 2 tablets by mouth at gout onset, then take 1 tablet daily until gout flare resolved What changed:  how much to take how to take this when to take this   cyanocobalamin 1000 MCG tablet Commonly known  as: VITAMIN B12 Take 1 tablet (1,000 mcg total) by mouth every Monday, Wednesday, and Friday.   docusate sodium 100 MG capsule Commonly known as: Colace Take 1 capsule (100 mg total) by mouth 2 (two) times daily.   famotidine 20 MG tablet Commonly known as: PEPCID Take 1 tablet (20 mg total) by mouth 2 (two) times daily. What changed:  how much to take when to take this   hydrochlorothiazide 25 MG tablet Commonly known as: HYDRODIURIL TAKE ONE TABLET BY MOUTH ONCE A DAY AS NEEDED FOR SWELLING What changed: See the new instructions.   ketoconazole 2 % cream Commonly known as: NIZORAL Apply 1 application topically daily as needed for irritation.   loratadine 10 MG tablet Commonly known as: CLARITIN Take 10 mg by mouth daily.   meloxicam 7.5 MG  tablet Commonly known as: MOBIC Take 7.5 mg by mouth daily.   polyethylene glycol powder 17 GM/SCOOP powder Commonly known as: GLYCOLAX/MIRALAX TAKE 17 GRAMS BY MOUTH TWICE DAILY What changed: See the new instructions.   potassium chloride SA 20 MEQ tablet Commonly known as: Klor-Con M20 Take 1 tablet (20 mEq total) by mouth daily. What changed: when to take this   simvastatin 10 MG tablet Commonly known as: ZOCOR TAKE 1 TABLET BY MOUTH ONCE A DAY AT [facility] THE EVENING What changed: See the new instructions.   tamsulosin 0.4 MG Caps capsule Commonly known as: FLOMAX TAKE ONE CAPSULE BY MOUTH ONCE DAILY AFTER SUPPER What changed: See the new instructions.   traMADol 50 MG tablet Commonly known as: ULTRAM Take 50 mg by mouth at bedtime.   vancomycin 125 MG capsule Commonly known as: VANCOCIN Take 1 capsule (125 mg total) by mouth 4 (four) times daily for 13 days.        Follow-up Information     Eustaquio Boyden, MD. Schedule an appointment as soon as possible for a visit in 1 week(s).   Specialty: Family Medicine Contact information: [address] [place] [address] [phone]         [facility]. Schedule an appointment as soon as possible for a visit in 1 month(s).   Contact information: [address] [place] [address] [phone]               No Known Allergies   Other Procedures/Studies: CT ABDOMEN PELVIS W CONTRAST  Result Date: 08/20/2023 CLINICAL DATA:  Acute abdominal pain. EXAM: CT ABDOMEN AND PELVIS WITH CONTRAST TECHNIQUE: Multidetector CT imaging of the abdomen and pelvis was performed using the standard protocol following bolus administration of intravenous contrast. RADIATION DOSE REDUCTION: This exam was performed according to the departmental dose-optimization program which includes automated exposure control, adjustment of the mA and/or kV according to patient size and/or use of  iterative reconstruction technique. CONTRAST:  75mL OMNIPAQUE IOHEXOL 350 MG/ML SOLN COMPARISON:  CT 11/04/2018 FINDINGS: Lower chest: There is some linear opacity lung bases likely scar or atelectasis. No pleural effusion. Coronary artery calcifications are seen. Hepatobiliary: Fatty liver infiltration. Patent portal vein. Gallbladder is present. Pancreas: Unremarkable. No pancreatic ductal dilatation or surrounding inflammatory changes. Spleen: Normal in size without focal abnormality.  Small splenule. Adrenals/Urinary Tract: Adrenal glands are preserved. No collecting system dilatation. The ureters have normal course and caliber extending down to the bladder. Several bilateral renal cystic foci identified. 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 One of the larger foci exophytic  from the lower pole on the left has a diameter of 6.9 cm with a Hounsfield unit of 15 and would be a Bosniak 1 lesion. There is a Bosniak 2 lesion for example exophytic medial from the right kidney measuring 3.5 cm in diameter on series 3, image 30 with a thin septation with calcification. Bosniak 2 lesion. Stomach/Bowel: There is a long segment of wall thickening with stranding along the descending, sigmoid colon and rectum consistent with a long segment colitis. More slight changes along the transverse colon in the ascending colon appears more normal. No bowel obstruction or dilatation of the colon. Normal appendix in the right hemipelvis. On this non oral contrast exam the stomach is relatively decompressed. Small bowel is nondilated. Vascular/Lymphatic: Normal caliber IVC with scattered arterial vascular calcifications. Areas of stenosis along the renal arteries. There is also a second there appearance to the abdominal aorta with enlargement overall of 2.2 x 3.1 cm. This has slightly increasing from the study of 2020. The saccular component on  series 6, image 50 of the coronal data set would measure proximally 16 mm. No specific abnormal lymph node enlargement identified in the abdomen and pelvis. Reproductive: Prominent prostate with mass effect along the base of the bladder. Other: No free intra-abdominal air. Slight mesenteric stranding. Trace simple free fluid in the pelvis. Portions of the pelvis are obscured by the streak artifact related to patient's right hip arthroplasty. Musculoskeletal: Curvature of the spine with moderate degenerative changes. There is moderate degenerative changes of the left hip. Right hip arthroplasty. IMPRESSION: Long segment left-sided colonic wall thickening with stranding and vascular engorgement consistent with a colitis. No obstruction or free air. Simple trace free fluid in the pelvis. 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 Diameter of the aorta approaches up to 3.1 cm. The saccular component measures up to 16 mm. Recommend referral to or continued care with vascular specialist. (Ref.: J Vasc Surg. 2018; 67:2-77 and J Am Coll Radiol 2013;10(10):789-794.) Electronically Signed   By: Karen Kays M.D.   On: 08/20/2023 18:15     TODAY-DAY OF DISCHARGE:  Subjective:   Adrian Davis today has no headache,no chest abdominal pain,no new weakness tingling or numbness, feels much better wants to go home today.  Objective:   Blood pressure (!) 140/87, pulse 81, temperature 98.5 F (36.9 C), temperature source Oral, resp. rate 19, height 5' 7.5" (1.715 m), weight 77 kg, SpO2 96%.  Intake/Output Summary (Last 24 hours) at 08/23/2023 0856 Last data filed at 08/23/2023 0849 Gross per 24 hour  Intake 253 ml  Output --  Net 253 ml   Filed Weights   08/20/23 1041  Weight: 77 kg    Exam: Awake Alert, Oriented *3, No new F.N deficits, Normal affect [place].AT,PERRAL Supple Neck,No JVD, No cervical lymphadenopathy appriciated.  Symmetrical Chest wall movement, Good air  movement bilaterally, CTAB RRR,No Gallops,Rubs or new Murmurs, No Parasternal Heave +ve B.Sounds, Abd Soft, Non tender, No organomegaly appriciated, No rebound -guarding or rigidity. No Cyanosis, Clubbing or edema, No new Rash or bruise   PERTINENT RADIOLOGIC STUDIES: No results found.   PERTINENT LAB RESULTS: CBC: Recent Labs    08/21/23 0409 08/22/23 0229  WBC 14.0* 11.4*  HGB 12.2* 11.1*  HCT 37.3* 34.4*  PLT 215 188   CMET CMP     Component Value Date/Time   NA 136 08/22/2023 0229   NA 142 08/24/2022 0742   K 3.3 (L) 08/22/2023 0229   CL 106 08/22/2023  0229   CO2 24 08/22/2023 0229   GLUCOSE 93 08/22/2023 0229   BUN 12 08/22/2023 0229   BUN 15 08/24/2022 0742   CREATININE 0.89 08/22/2023 0229   CREATININE 0.85 01/27/2016 1136   CALCIUM 8.3 (L) 08/22/2023 0229   PROT 6.0 (L) 08/20/2023 1212   PROT 6.7 08/24/2022 0742   ALBUMIN 3.8 08/20/2023 1212   ALBUMIN 4.5 08/24/2022 0742   AST 20 08/20/2023 1212   ALT 18 08/20/2023 1212   ALKPHOS 55 08/20/2023 1212   BILITOT 0.5 08/20/2023 1212   BILITOT 0.3 08/24/2022 0742   GFR 80.85 01/24/2023 0748   EGFR 87 08/24/2022 0742   GFRNONAA >60 08/22/2023 0229    GFR Estimated Creatinine Clearance: 62 mL/min (by C-G formula based on SCr of 0.89 mg/dL). Recent Labs    08/20/23 1212  LIPASE 30   No results for input(s): "CKTOTAL", "CKMB", "CKMBINDEX", "TROPONINI" in the last 72 hours. Invalid input(s): "POCBNP" No results for input(s): "DDIMER" in the last 72 hours. No results for input(s): "HGBA1C" in the last 72 hours. No results for input(s): "CHOL", "HDL", "LDLCALC", "TRIG", "CHOLHDL", "LDLDIRECT" in the last 72 hours. No results for input(s): "TSH", "T4TOTAL", "T3FREE", "THYROIDAB" in the last 72 hours.  Invalid input(s): "FREET3" No results for input(s): "VITAMINB12", "FOLATE", "FERRITIN", "TIBC", "IRON", "RETICCTPCT" in the last 72 hours. Coags: No results for input(s): "INR" in the last 72  hours.  Invalid input(s): "PT" Microbiology: Recent Results (from the past 240 hour(s))  C Difficile Quick Screen w PCR reflex     Status: Abnormal   Collection Time: 08/20/23  8:44 PM   Specimen: Stool  Result Value Ref Range Status   C Diff antigen POSITIVE (A) NEGATIVE Final   C Diff toxin NEGATIVE NEGATIVE Final   C Diff interpretation Results are indeterminate. See PCR results.  Final    Comment: Performed at [facility], [address], [place], [place] [address]  Gastrointestinal Panel by PCR , Stool     Status: None   Collection Time: 08/20/23  8:44 PM   Specimen: Stool  Result Value Ref Range Status   Campylobacter species NOT DETECTED NOT DETECTED Final   Plesimonas shigelloides NOT DETECTED NOT DETECTED Final   Salmonella species NOT DETECTED NOT DETECTED Final   Yersinia enterocolitica NOT DETECTED NOT DETECTED Final   Vibrio species NOT DETECTED NOT DETECTED Final   Vibrio cholerae NOT DETECTED NOT DETECTED Final   Enteroaggregative E coli (EAEC) NOT DETECTED NOT DETECTED Final   Enteropathogenic E coli (EPEC) NOT DETECTED NOT DETECTED Final   Enterotoxigenic E coli (ETEC) NOT DETECTED NOT DETECTED Final   Shiga like toxin producing E coli (STEC) NOT DETECTED NOT DETECTED Final   Shigella/Enteroinvasive E coli (EIEC) NOT DETECTED NOT DETECTED Final   Cryptosporidium NOT DETECTED NOT DETECTED Final   Cyclospora cayetanensis NOT DETECTED NOT DETECTED Final   Entamoeba histolytica NOT DETECTED NOT DETECTED Final   Giardia lamblia NOT DETECTED NOT DETECTED Final   Adenovirus F40/41 NOT DETECTED NOT DETECTED Final   Astrovirus NOT DETECTED NOT DETECTED Final   Norovirus GI/GII NOT DETECTED NOT DETECTED Final   Rotavirus A NOT DETECTED NOT DETECTED Final   Sapovirus (I, II, IV, and V) NOT DETECTED NOT DETECTED Final    Comment: Performed at [facility], [address] [place], [place] [address]  Calprotectin, Fecal     Status: Abnormal   Collection  Time: 08/20/23  8:44 PM   Specimen: Stool  Result Value  Ref Range Status   Calprotectin, Fecal 7,460 (H) 0 - 120 ug/g Final    Comment: (NOTE) **Results verified by repeat testing** Concentration     Interpretation   Follow-Up < 5 - 50 ug/g     Normal           None >50 -120 ug/g     Borderline       Re-evaluate in 4-6 weeks    >120 ug/g     Abnormal         Repeat as clinically                                   indicated Performed At: [facility] [address] [place], [place] [id number] Jolene Schimke MD NW:[id number]   C. Diff by PCR, Reflexed     Status: Abnormal   Collection Time: 08/20/23  8:44 PM  Result Value Ref Range Status   Toxigenic C. Difficile by PCR POSITIVE (A) NEGATIVE Final    Comment: Positive for toxigenic C. difficile with little to no toxin production. Only treat if clinical presentation suggests symptomatic illness. Performed at [facility], [address], [place], [place] [address]   Culture, blood (Routine X 2) w Reflex to ID Panel     Status: None (Preliminary result)   Collection Time: 08/20/23 10:26 PM   Specimen: BLOOD  Result Value Ref Range Status   Specimen Description BLOOD SITE NOT SPECIFIED  Final   Special Requests   Final    BOTTLES DRAWN AEROBIC AND ANAEROBIC Blood Culture adequate volume   Culture   Final    NO GROWTH 3 DAYS Performed at [facility], [address] [place], [place] [address]    Report Status PENDING  Incomplete  Culture, blood (Routine X 2) w Reflex to ID Panel     Status: None (Preliminary result)   Collection Time: 08/20/23 10:30 PM   Specimen: BLOOD  Result Value Ref Range Status   Specimen Description BLOOD SITE NOT SPECIFIED  Final   Special Requests   Final    BOTTLES DRAWN AEROBIC AND ANAEROBIC Blood Culture adequate volume   Culture   Final    NO GROWTH 3 DAYS Performed at [facility], [address] [place], [place] [address]    Report Status PENDING  Incomplete     FURTHER DISCHARGE INSTRUCTIONS:  Get Medicines reviewed and adjusted: Please take all your medications with you for your next visit with your Primary MD  Laboratory/radiological data: Please request your Primary MD to go over all hospital tests and procedure/radiological results at the follow up, please ask your Primary MD to get all Hospital records sent to his/her office.  In some cases, they will be blood work, cultures and biopsy results pending at the time of your discharge. Please request that your primary care M.D. goes through all the records of your hospital data and follows up on these results.  Also Note the following: If you experience worsening of your admission symptoms, develop shortness of breath, life threatening emergency, suicidal or homicidal thoughts you must seek medical attention immediately by calling 911 or calling your MD immediately  if symptoms less severe.  You must read complete instructions/literature along with all the possible adverse reactions/side effects for all the Medicines you take and that have been prescribed to you. Take any new Medicines after you have completely understood  and accpet all the possible adverse reactions/side effects.   Do not drive when taking Pain medications or sleeping medications (Benzodaizepines)  Do not take more than prescribed Pain, Sleep and Anxiety Medications. It is not advisable to combine anxiety,sleep and pain medications without talking with your primary care practitioner  Special Instructions: If you have smoked or chewed Tobacco  in the last 2 yrs please stop smoking, stop any regular Alcohol  and or any Recreational drug use.  Wear Seat belts while driving.  Please note: You were cared for by a hospitalist during your hospital stay. Once you are discharged, your primary care physician will handle any further medical issues. Please note that NO REFILLS for any discharge medications will be authorized once you  are discharged, as it is imperative that you return to your primary care physician (or establish a relationship with a primary care physician if you do not have one) for your post hospital discharge needs so that they can reassess your need for medications and monitor your lab values.  Total Time spent coordinating discharge including counseling, education and face to face time equals greater than 30 minutes.  SignedJeoffrey Massed 08/23/2023 8:56 AM

## 2023-08-23 NOTE — Plan of Care (Signed)
  Problem: Education: Goal: Knowledge of General Education information will improve Description: Including pain rating scale, medication(s)/side effects and non-pharmacologic comfort measures Outcome: Progressing   Problem: Health Behavior/Discharge Planning: Goal: Ability to manage health-related needs will improve Outcome: Progressing   Problem: Clinical Measurements: Goal: Ability to maintain clinical measurements within normal limits will improve Outcome: Progressing   Problem: Nutrition: Goal: Adequate nutrition will be maintained Outcome: Progressing   Problem: Elimination: Goal: Will not experience complications related to bowel motility Outcome: Progressing   Problem: Safety: Goal: Ability to remain free from injury will improve Outcome: Progressing

## 2023-08-23 NOTE — Progress Notes (Signed)
Mobility Specialist Progress Note;   08/23/23 0945  Mobility  Activity Ambulated independently in hallway  Level of Assistance Independent  Assistive Device None  Distance Ambulated (ft) 325 ft  Activity Response Tolerated well  Mobility Referral Yes  $Mobility charge 1 Mobility  Mobility Specialist Start Time (ACUTE ONLY) 0945  Mobility Specialist Stop Time (ACUTE ONLY) 0955  Mobility Specialist Time Calculation (min) (ACUTE ONLY) 10 min   Pt agreeable to mobility. Required no physical assistance during ambulation. Asx throughout and no c/o during session. Pt back in room with all needs met, eager for discharge.   Caesar Bookman Mobility Specialist Please contact via SecureChat or Rehab Office [phone]

## 2023-08-23 NOTE — TOC Transition Note (Signed)
Transition of Care [facility]) - CM/SW Discharge Note   Patient Details  Name: Adrian Davis MRN: [id number] Date of Birth: [birth year]  Transition [facility]) CM/SW Contact:  Gordy Clement, RN Phone Number: 08/23/2023, 8:58 AM   Clinical Narrative:     Patient to dc to home today  Wife to transport No TOC needs           Patient Goals and CMS Choice      Discharge Placement                         Discharge Plan and Services Additional resources added to the After Visit Summary for                                       Social Determinants of Health (SDOH) Interventions SDOH Screenings   Food Insecurity: No Food Insecurity (08/20/2023)  Housing: Low Risk  (08/20/2023)  Transportation Needs: No Transportation Needs (08/20/2023)  Utilities: Not At Risk (08/20/2023)  Alcohol Screen: Low Risk  (08/22/2022)  Depression (PHQ2-9): Medium Risk (12/06/2022)  Financial Resource Strain: Low Risk  (08/22/2022)  Social Connections: Socially Integrated (08/22/2022)  Stress: No Stress Concern Present (08/22/2022)  Tobacco Use: High Risk (08/20/2023)     Readmission Risk Interventions     No data to display

## 2023-08-24 ENCOUNTER — Telehealth: Payer: Self-pay | Admitting: Family Medicine

## 2023-08-24 ENCOUNTER — Telehealth: Payer: Self-pay

## 2023-08-24 NOTE — Telephone Encounter (Signed)
Left message to return call to our office.  

## 2023-08-24 NOTE — Telephone Encounter (Signed)
Pt wife called in and would like a called back from CMA if possible [phone]

## 2023-08-24 NOTE — Telephone Encounter (Addendum)
Please call patient:  Recommend he be sure to hydrate well with water and electrolytes if he's experiencing recurrent diarrhea.  He needs a low residue diet so eat bland food (potatoes, white rice, meat, eggs, etc). Avoid food that is spicy, greasy, high in fiber. Avoid nuts/seeds, popcorn, granola, beans, etc.

## 2023-08-24 NOTE — Transitions of Care (Post Inpatient/ED Visit) (Signed)
08/24/2023  Name: Adrian Davis MRN: [id number] DOB: 20-Dec-1940  Today's TOC FU Call Status: Today's TOC FU Call Status:: Successful TOC FU Call Completed TOC FU Call Complete Date: 08/24/23 Patient's Name and Date of Birth confirmed.  Transition Care Management Follow-up Telephone Call Date of Discharge: 08/23/23 Discharge Facility: [facility]) Type of Discharge: Inpatient Admission Primary Inpatient Discharge Diagnosis:: GI bleed How have you been since you were released from the hospital?: Better Any questions or concerns?: No  Items Reviewed: Did you receive and understand the discharge instructions provided?: No Medications obtained,verified, and reconciled?: Yes (Medications Reviewed) Any new allergies since your discharge?: No Dietary orders reviewed?: Yes Do you have support at home?: Yes People in Home: spouse  Medications Reviewed Today: Medications Reviewed Today     Reviewed by Karena Addison, LPN (Licensed Practical Nurse) on 08/24/23 at 1058  Med List Status: <None>   Medication Order Taking? Sig Documenting Provider Last Dose Status Informant  acetaminophen (TYLENOL) 650 MG CR tablet [id number] No Take 650 mg by mouth 3 (three) times daily. [provider] 08/19/2023 Active Spouse/Significant Other           Med Note (SATTERFIELD, Marquis Buggy Aug 20, 2023  8:19 PM) Wife states he is taking 3 every day because of back and leg pain   allopurinol (ZYLOPRIM) 100 MG tablet [id number] No Take 1 tablet (100 mg total) by mouth daily. Eustaquio Boyden, MD Past Week Active Spouse/Significant Other           Med Note (SATTERFIELD, Marquis Buggy Aug 20, 2023  8:21 PM) ON HOLD per wife   amLODipine (NORVASC) 2.5 MG tablet [id number] No TAKE 2 TABLETS BY MOUTH EVERY MORNING AND 1 TABLET IN THE AFTERNOON AS DIRECTED  Patient taking differently: Take 5 mg by mouth daily.   [place], Peter M, MD 08/20/2023 Active Spouse/Significant Other  aspirin 81 MG chewable  tablet [id number] No Chew 81 mg by mouth every evening. [provider] 08/19/2023 Active Spouse/Significant Other  benazepril (LOTENSIN) 20 MG tablet [id number] No TAKE ONE TABLET BY MOUTH TWO TIMES DAILY [place], Peter M, MD 08/20/2023 Active Spouse/Significant Other  colchicine 0.6 MG tablet [id number] No Take 2 tablets by mouth at gout onset, then take 1 tablet daily until gout flare resolved  Patient taking differently: Take 2 tablets by mouth See admin instructions. Take 2 tablets by mouth at gout onset, then take 1 tablet daily until gout flare resolved   Eustaquio Boyden, MD 08/20/2023 Active Spouse/Significant Other           Med Note Lorayne Marek, REBECCA G   Mon Jun 04, 2023  2:53 PM) PRN  cyanocobalamin (VITAMIN B12) 1000 MCG tablet [id number] No Take 1 tablet (1,000 mcg total) by mouth every Monday, Wednesday, and Friday. Eustaquio Boyden, MD 08/20/2023 Active Spouse/Significant Other  docusate sodium (COLACE) 100 MG capsule [id number] No Take 1 capsule (100 mg total) by mouth 2 (two) times daily. [place], Peter M, MD 08/19/2023 Active Spouse/Significant Other           Med Note (SATTERFIELD, Marquis Buggy Aug 20, 2023  8:14 PM)    famotidine (PEPCID) 20 MG tablet [id number] No Take 1 tablet (20 mg total) by mouth 2 (two) times daily.  Patient taking differently: Take 40 mg by mouth at bedtime.   Eustaquio Boyden, MD 08/19/2023 Active Spouse/Significant Other  hydrochlorothiazide (HYDRODIURIL) 25 MG tablet [id number] No TAKE ONE TABLET BY MOUTH ONCE A  DAY AS NEEDED FOR SWELLING  Patient taking differently: Take 25 mg by mouth daily as needed (swelling).   [place], Peter M, MD Taking Active Spouse/Significant Other  ketoconazole (NIZORAL) 2 % cream [id number] No Apply 1 application topically daily as needed for irritation. [provider] Past Week Active Spouse/Significant Other           Med Note Gunnar Fusi, MELISSA R   Fri Nov 04, 2021 10:33 AM) Wife's Rx  loratadine  (CLARITIN) 10 MG tablet [id number] No Take 10 mg by mouth daily. [provider] 08/20/2023 Active Spouse/Significant Other  meloxicam (MOBIC) 7.5 MG tablet [id number] No Take 7.5 mg by mouth daily. [provider] 08/18/2023 Active Spouse/Significant Other  polyethylene glycol powder (GLYCOLAX/MIRALAX) 17 GM/SCOOP powder [id number] No TAKE 17 GRAMS BY MOUTH TWICE DAILY  Patient taking differently: Take 17 g by mouth at bedtime.   Mansouraty, Netty Starring., MD 08/19/2023 Active Spouse/Significant Other           Med Note (SATTERFIELD, DARIUS E   Mon Aug 20, 2023  8:15 PM)    potassium chloride SA (KLOR-CON M20) 20 MEQ tablet [id number] No Take 1 tablet (20 mEq total) by mouth daily.  Patient taking differently: Take 20 mEq by mouth in the morning and at bedtime.   [place], Peter M, MD 08/20/2023 Active Spouse/Significant Other  simvastatin (ZOCOR) 10 MG tablet [id number] No TAKE 1 TABLET BY MOUTH ONCE A DAY AT [facility] THE EVENING  Patient taking differently: Take 10 mg by mouth daily at 6 PM.   [place], Peter M, MD 08/19/2023 Active Spouse/Significant Other  tamsulosin (FLOMAX) 0.4 MG CAPS capsule [id number] No TAKE ONE CAPSULE BY MOUTH ONCE DAILY AFTER SUPPER  Patient taking differently: Take 0.4 mg by mouth daily after supper.   [place], Peter M, MD 08/19/2023 Active Spouse/Significant Other  traMADol (ULTRAM) 50 MG tablet [id number] No Take 50 mg by mouth at bedtime. [provider] 08/19/2023 Active Spouse/Significant Other  vancomycin (VANCOCIN) 125 MG capsule [id number]  Take 1 capsule (125 mg total) by mouth 4 (four) times daily for 13 days. Maretta Bees, MD  Active             Home Care and Equipment/Supplies: Were Home Health Services Ordered?: NA Any new equipment or medical supplies ordered?: NA  Functional Questionnaire: Do you need assistance with bathing/showering or dressing?: No Do you need assistance with meal preparation?: No Do you need assistance  with eating?: No Do you have difficulty maintaining continence: No Do you need assistance with getting out of bed/getting out of a chair/moving?: No Do you have difficulty managing or taking your medications?: No  Follow up appointments reviewed: PCP Follow-up appointment confirmed?: Yes Date of PCP follow-up appointment?: 09/04/23 Follow-up Provider: [facility] Follow-up appointment confirmed?: No Reason Specialist Follow-Up Not Confirmed: Patient has Specialist Provider Number and will Call for Appointment Do you need transportation to your follow-up appointment?: No Do you understand care options if your condition(s) worsen?: Yes-patient verbalized understanding    SIGNATURE Karena Addison, LPN [facility] Nurse Health Advisor Direct Dial [phone]

## 2023-08-24 NOTE — Telephone Encounter (Signed)
Rtn pt's wife's (Trudy- on dpr) call. States pt was recently in hospital, dx C. diff. She states the d/c summary didn't indicate what pt should or should not eat. Requests diet suggestions be faxed to home # [phone]. Also, pt is asking can he drive. Has hosp f/u 09/04/23. Damian Leavell can be reached at her cell #: [phone]. Plz advise.

## 2023-08-25 LAB — CULTURE, BLOOD (ROUTINE X 2)
Culture: NO GROWTH
Culture: NO GROWTH
Special Requests: ADEQUATE
Special Requests: ADEQUATE

## 2023-08-27 NOTE — Progress Notes (Unsigned)
[facility] [place]  ASSESSMENT / PLAN: Adrian Davis is a 82 y.o. male with a 31mm infrarenal abdominal aortic aneurysm  Recommend:  Abstinence from all tobacco products. Blood glucose control with goal A1c < 7%. Blood pressure control with goal blood pressure < 140/90 mmHg. Lipid reduction therapy with goal LDL-C <100 mg/dL.  Aspirin 81mg  PO QD.  Atorvastatin 40-80mg  PO QD (or other "high intensity" statin therapy).  Follow-up with me in 2 years with abdominal aortic duplex.  CHIEF COMPLAINT: Incidental discovery of aneurysm  HISTORY OF PRESENT ILLNESS: Adrian Davis is a 82 y.o. male who was referred to clinic for evaluation of abdominal aortic aneurysm.  This was discovered incidentally on a CT scan of the abdomen pelvis done for abdominal pain which ultimately identified colitis as a cause.  Has been doing well since starting treatment.  We reviewed the aortic findings in detail.  I explained the low risk nature of this finding.  We discussed the natural history of abdominal aortic aneurysm disease.  Past Medical History:  Diagnosis Date   Abnormal echocardiogram Jan 2012   grade 1 diastolic dysfunction, normal EF, mild LVH   Anemia    Basal cell carcinoma 04/08/2019   left nose inferior to nasal alar groove. EDC: 05/20/2019   Bell palsy 10/2010   presented with facial numbness   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    Degenerative disk disease    GERD (gastroesophageal reflux disease)    History of kidney stones    Hyperlipidemia    Hypertension    dr [name]   Kidney cysts    Lumbar stenosis    Nephrolithiasis    Normal nuclear stress test 2008   Osteoarthritis    Skin cancer    Vocal cord leukoplakia    Dr Lazarus Salines    Past Surgical History:  Procedure Laterality Date   COLONOSCOPY  04/2019   TAx2, hemorrhoids, rpt 61yrs (Mansouraty)   ESOPHAGOGASTRODUODENOSCOPY  04/2019   erosive gastropathy, biopsy negative for  barrett's (Mansouraty)   KNEE ARTHROSCOPY Right    LUMBAR LAMINECTOMY/DECOMPRESSION MICRODISCECTOMY N/A 11/12/2013   Cristi Loron, MD - complicated by urinary retention   MULTIPLE TOOTH EXTRACTIONS     SHOULDER SURGERY Bilateral    TONSILLECTOMY     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   Procedure: TOTAL HIP ARTHROPLASTY ANTERIOR APPROACH;  Surgeon: Ollen Gross, MD;  Location: [facility];  Service: Orthopedics;  Laterality: Right;    TOTAL KNEE ARTHROPLASTY Left 11/21/2021   Procedure: TOTAL KNEE ARTHROPLASTY;  Surgeon: Ollen Gross, MD;  Location: [facility];  Service: Orthopedics;  Laterality: Left;    Family History  Problem Relation Age of Onset   Heart failure Father 79   Hypertension Father    Arthritis Father    Anemia Mother        needed regular transfusions   Prostate cancer Son    Cancer Neg Hx    Diabetes Neg Hx    Colon cancer Neg Hx    Rectal cancer Neg Hx    Stomach cancer Neg Hx    Inflammatory bowel disease Neg Hx    Liver disease Neg Hx    Pancreatic cancer Neg Hx     Social History   Socioeconomic History   Marital status: Married    Spouse name: Not on file   Number of children: 1   Years of education: Not on file   Highest education level: Not on  file  Occupational History   Occupation: farmer  Tobacco Use   Smoking status: Former    Current packs/day: 0.00    Average packs/day: 1.5 packs/day for 10.0 years (15.0 ttl pk-yrs)    Types: Cigarettes    Start date: 05/15/1966    Quit date: 05/15/1976    Years since quitting: 47.3   Smokeless tobacco: Current    Types: Chew   Tobacco comments:    chews tobacco  Vaping Use   Vaping status: Never Used  Substance and Sexual Activity   Alcohol use: Yes    Comment: rare   Drug use: No   Sexual activity: Not on file  Other Topics Concern   Not on file  Social History Narrative   Lives with wife   Occ: farm   Edu: HS   Activity: active on farm   Diet: good water, fruits/vegetables  daily   Social Determinants of Health   Financial Resource Strain: Low Risk  (08/22/2022)   Overall Financial Resource Strain (CARDIA)    Difficulty of Paying Living Expenses: Not hard at all  Food Insecurity: No Food Insecurity (08/20/2023)   Hunger Vital Sign    Worried About Running Out of Food in the Last Year: Never true    Ran Out of Food in the Last Year: Never true  Transportation Needs: No Transportation Needs (08/20/2023)   PRAPARE - Administrator, Civil Service (Medical): No    Lack of Transportation (Non-Medical): No  Physical Activity: Not on file  Stress: No Stress Concern Present (08/22/2022)   [employer] of Occupational Health - Occupational Stress Questionnaire    Feeling of Stress : Not at all  Social Connections: Socially Integrated (08/22/2022)   Social Connection and Isolation Panel [NHANES]    Frequency of Communication with Friends and Family: More than three times a week    Frequency of Social Gatherings with Friends and Family: More than three times a week    Attends Religious Services: More than 4 times per year    Active Member of [employer] or Organizations: Yes    Attends Engineer, structural: More than 4 times per year    Marital Status: Married  Catering manager Violence: Not At Risk (08/20/2023)   Humiliation, Afraid, Rape, and Kick questionnaire    Fear of Current or Ex-Partner: No    Emotionally Abused: No    Physically Abused: No    Sexually Abused: No    No Known Allergies  Current Outpatient Medications  Medication Sig Dispense Refill   acetaminophen (TYLENOL) 650 MG CR tablet Take 650 mg by mouth 3 (three) times daily.     allopurinol (ZYLOPRIM) 100 MG tablet Take 1 tablet (100 mg total) by mouth daily. 90 tablet 3   amLODipine (NORVASC) 2.5 MG tablet TAKE 2 TABLETS BY MOUTH EVERY MORNING AND 1 TABLET IN THE AFTERNOON AS DIRECTED (Patient taking differently: Take 5 mg by mouth daily.) 270 tablet 3   aspirin 81 MG  chewable tablet Chew 81 mg by mouth every evening.     benazepril (LOTENSIN) 20 MG tablet TAKE ONE TABLET BY MOUTH TWO TIMES DAILY 180 tablet 3   colchicine 0.6 MG tablet Take 2 tablets by mouth at gout onset, then take 1 tablet daily until gout flare resolved (Patient taking differently: Take 2 tablets by mouth See admin instructions. Take 2 tablets by mouth at gout onset, then take 1 tablet daily until gout flare resolved) 60 tablet 0  cyanocobalamin (VITAMIN B12) 1000 MCG tablet Take 1 tablet (1,000 mcg total) by mouth every Monday, Wednesday, and Friday.     docusate sodium (COLACE) 100 MG capsule Take 1 capsule (100 mg total) by mouth 2 (two) times daily. 180 capsule 3   famotidine (PEPCID) 20 MG tablet Take 1 tablet (20 mg total) by mouth 2 (two) times daily. (Patient taking differently: Take 40 mg by mouth at bedtime.) 180 tablet 4   hydrochlorothiazide (HYDRODIURIL) 25 MG tablet TAKE ONE TABLET BY MOUTH ONCE A DAY AS NEEDED FOR SWELLING (Patient taking differently: Take 25 mg by mouth daily as needed (swelling).) 90 tablet 1   ketoconazole (NIZORAL) 2 % cream Apply 1 application topically daily as needed for irritation.     loratadine (CLARITIN) 10 MG tablet Take 10 mg by mouth daily.     meloxicam (MOBIC) 7.5 MG tablet Take 7.5 mg by mouth daily.     polyethylene glycol powder (GLYCOLAX/MIRALAX) 17 GM/SCOOP powder TAKE 17 GRAMS BY MOUTH TWICE DAILY (Patient taking differently: Take 17 g by mouth at bedtime.) 510 g 2   potassium chloride SA (KLOR-CON M20) 20 MEQ tablet Take 1 tablet (20 mEq total) by mouth daily. (Patient taking differently: Take 20 mEq by mouth in the morning and at bedtime.) 90 tablet 3   simvastatin (ZOCOR) 10 MG tablet TAKE 1 TABLET BY MOUTH ONCE A DAY AT [facility] THE EVENING (Patient taking differently: Take 10 mg by mouth daily at 6 PM.) 90 tablet 3   tamsulosin (FLOMAX) 0.4 MG CAPS capsule TAKE ONE CAPSULE BY MOUTH ONCE DAILY AFTER SUPPER (Patient taking differently: Take  0.4 mg by mouth daily after supper.) 90 capsule 3   traMADol (ULTRAM) 50 MG tablet Take 50 mg by mouth at bedtime.     vancomycin (VANCOCIN) 125 MG capsule Take 1 capsule (125 mg total) by mouth 4 (four) times daily for 13 days. 52 capsule 0   No current facility-administered medications for this visit.    PHYSICAL EXAM There were no vitals filed for this visit.  Well appearing man in no distress Appears younger than stated age Regular rate and rhythm Unlabored breathing Soft, nontender abdomen    PERTINENT LABORATORY AND RADIOLOGIC DATA  Most recent CBC    Latest Ref Rng & Units 08/22/2023    2:29 AM 08/21/2023    4:09 AM 08/20/2023   10:26 PM  CBC  WBC 4.0 - 10.5 K/uL 11.4  14.0  15.4   Hemoglobin 13.0 - 17.0 g/dL 37.6  28.3  15.1   Hematocrit 39.0 - 52.0 % 34.4  37.3  41.2   Platelets 150 - 400 K/uL 188  215  213      Most recent CMP    Latest Ref Rng & Units 08/22/2023    2:29 AM 08/20/2023   12:12 PM 01/24/2023    7:48 AM  CMP  Glucose 70 - 99 mg/dL 93  761  79   BUN 8 - 23 mg/dL 12  23  18    Creatinine 0.61 - 1.24 mg/dL 6.07  3.71  0.62   Sodium 135 - 145 mmol/L 136  139  139   Potassium 3.5 - 5.1 mmol/L 3.3  3.8  3.6   Chloride 98 - 111 mmol/L 106  109  106   CO2 22 - 32 mmol/L 24  21  26    Calcium 8.9 - 10.3 mg/dL 8.3  8.6  9.4   Total Protein 6.5 - 8.1 g/dL  6.0  5.9   Total Bilirubin 0.3 - 1.2 mg/dL  0.5  0.3   Alkaline Phos 38 - 126 U/L  55  50   AST 15 - 41 U/L  20  10   ALT 0 - 44 U/L  18  11     Renal function Estimated Creatinine Clearance: 62 mL/min (by C-G formula based on SCr of 0.89 mg/dL).  Hemoglobin A1C (%)  Date Value  11/11/2021 5.6   Hgb A1c MFr Bld (%)  Date Value  11/02/2020 5.8    LDL Chol Calc (NIH)  Date Value Ref Range Status  08/24/2022 83 0 - 99 mg/dL Final   LDL Cholesterol  Date Value Ref Range Status  01/24/2023 59 0 - 99 mg/dL Final   Direct LDL  Date Value Ref Range Status  05/15/2011 102.0 mg/dL Final     Comment:    Optimal:  <100 mg/dLNear or Above Optimal:  100-129 mg/dLBorderline High:  130-159 mg/dLHigh:  160-189 mg/dLVery High:  >190 mg/dL    CT scan 78/29/5621.  Personally reviewed.  Small infrarenal abdominal aortic aneurysm without rupture.  Rande Brunt. Lenell Antu, MD [facility] Phone Number: [phone] 08/27/2023 4:18 PM   Total time spent on preparing this encounter including chart review, data review, collecting history, examining the patient, coordinating care for this new patient, 60 minutes.  Portions of this report may have been transcribed using voice recognition software.  Every effort has been made to ensure accuracy; however, inadvertent computerized transcription errors may still be present.

## 2023-08-27 NOTE — Telephone Encounter (Signed)
Lvm asking pt/pt's wife, Damian Leavell (on dpr) to call back. Need to relay Kate's message.

## 2023-08-28 ENCOUNTER — Encounter: Payer: Self-pay | Admitting: Vascular Surgery

## 2023-08-28 ENCOUNTER — Ambulatory Visit (INDEPENDENT_AMBULATORY_CARE_PROVIDER_SITE_OTHER): Payer: Medicare Other | Admitting: Vascular Surgery

## 2023-08-28 VITALS — BP 171/82 | HR 72 | Temp 98.4°F | Resp 18 | Ht 67.5 in | Wt 166.1 lb

## 2023-08-28 DIAGNOSIS — I7143 Infrarenal abdominal aortic aneurysm, without rupture: Secondary | ICD-10-CM

## 2023-08-28 NOTE — Telephone Encounter (Signed)
Left message to return call to our office.  This is 3rd call do you want [place] to send letter to have call office?

## 2023-08-28 NOTE — Telephone Encounter (Signed)
Ok to send letter with below recommendations.  Is he feeling strong enough to get out of the house? If so, I think reasonable to drive as long as he was driving prior to hospitalization.

## 2023-08-29 NOTE — Telephone Encounter (Signed)
Mailed a letter

## 2023-08-30 ENCOUNTER — Other Ambulatory Visit: Payer: Self-pay | Admitting: Cardiology

## 2023-09-04 ENCOUNTER — Encounter: Payer: Self-pay | Admitting: Family Medicine

## 2023-09-04 ENCOUNTER — Ambulatory Visit: Payer: Medicare Other | Admitting: Family Medicine

## 2023-09-04 VITALS — BP 160/88 | HR 75 | Temp 98.6°F | Ht 67.5 in | Wt 166.0 lb

## 2023-09-04 DIAGNOSIS — M1A4710 Other secondary chronic gout, right ankle and foot, without tophus (tophi): Secondary | ICD-10-CM

## 2023-09-04 DIAGNOSIS — Z720 Tobacco use: Secondary | ICD-10-CM

## 2023-09-04 DIAGNOSIS — K219 Gastro-esophageal reflux disease without esophagitis: Secondary | ICD-10-CM | POA: Diagnosis not present

## 2023-09-04 DIAGNOSIS — A0472 Enterocolitis due to Clostridium difficile, not specified as recurrent: Secondary | ICD-10-CM

## 2023-09-04 DIAGNOSIS — I10 Essential (primary) hypertension: Secondary | ICD-10-CM | POA: Diagnosis not present

## 2023-09-04 DIAGNOSIS — Z23 Encounter for immunization: Secondary | ICD-10-CM | POA: Diagnosis not present

## 2023-09-04 DIAGNOSIS — I7143 Infrarenal abdominal aortic aneurysm, without rupture: Secondary | ICD-10-CM | POA: Diagnosis not present

## 2023-09-04 LAB — COMPREHENSIVE METABOLIC PANEL
ALT: 12 U/L (ref 0–53)
AST: 14 U/L (ref 0–37)
Albumin: 4.3 g/dL (ref 3.5–5.2)
Alkaline Phosphatase: 55 U/L (ref 39–117)
BUN: 19 mg/dL (ref 6–23)
CO2: 26 meq/L (ref 19–32)
Calcium: 9.6 mg/dL (ref 8.4–10.5)
Chloride: 108 meq/L (ref 96–112)
Creatinine, Ser: 0.76 mg/dL (ref 0.40–1.50)
GFR: 84.43 mL/min (ref >60.00)
Glucose, Bld: 88 mg/dL (ref 70–99)
Potassium: 4.3 meq/L (ref 3.5–5.1)
Sodium: 141 meq/L (ref 135–145)
Total Bilirubin: 0.3 mg/dL (ref 0.2–1.2)
Total Protein: 6.7 g/dL (ref 6.0–8.3)

## 2023-09-04 LAB — URIC ACID: Uric Acid, Serum: 5.2 mg/dL (ref 4.0–7.8)

## 2023-09-04 NOTE — Assessment & Plan Note (Signed)
He quit during hospitalization. Encouraged ongoing attempts at full cessation.

## 2023-09-04 NOTE — Assessment & Plan Note (Addendum)
Chronic ,BP elevated in office today - BP log sheet provided to monitor at home, let us know if consistently above goal.  He currently takes amlodipine 2.5mg  BID with extra dose as needed, as well as benazepril 20mg  bid.  They are planning to restart hydrochlorothiazide tomorrow after completing antibiotic course - see below.

## 2023-09-04 NOTE — Assessment & Plan Note (Addendum)
Hospitalization records reviewed Completed oral vancomycin course  Provided post-colitis diet recommendations.  Symptoms improved. Update if recurrent symptoms develop.

## 2023-09-04 NOTE — Assessment & Plan Note (Signed)
Overall stable period. Previously on PPI + pepcid, PPI was stopped after C diff colitis and pepcid increased to 20mg  BID/. Given stable period without symptoms on pepcid BID, will drop to 20mg  once daily.

## 2023-09-04 NOTE — Progress Notes (Signed)
Ph: [phone] Fax: [phone]   Patient ID: Adrian Davis, male    DOB: 10-15-1941, 82 y.o.   MRN: [id number]  This visit was conducted in person.  BP (!) 160/88   Pulse 75   Temp 98.6 F (37 C) (Oral)   Ht 5' 7.5" (1.715 m)   Wt 166 lb (75.3 kg)   SpO2 99%   BMI 25.62 kg/m   BP Readings from Last 3 Encounters:  09/04/23 (!) 160/88  08/28/23 (!) 171/82  08/23/23 (!) 142/86   160/82 on recheck  CC: hosp f/u visit  Subjective:   HPI: Adrian Davis is a 82 y.o. male presenting on 09/04/2023 for Hospitalization Follow-up   Recent hospitalization for C difficile colitis presenting with abdominal pain and diarrhea and blood per rectum. CT scan showed long segment L sided colitis. Initially treated with rocephin and flagyl - transitioned to oral vancomycin with improvement.  Hospital records reviewed. Med rec performed.  Mild hypokalemia repleted - needs this rechecked today. He regularly takes potassium daily, more when taking hydrochlorothiazide. Hydrochlorothiazide was held due to low BPs but restarted prior to discharge.  Last vancomycin dose will be tomorrow am.   Pantoprazole, thiamine stopped. PPI was changed to famotidine 20mg  BID.   He quit chewing tobacco in hospital, but has restarted chewing since he came out.   HTN - bp remaining elevated despite regular antihypertensive regimen of amlodipine 2.5mg  twice daily (occasional extra dose in am), benazepril 20mg  twice daily, hydrochlorothiazide 25mg  daily as needed for leg swelling - he was taking this regularly. He takes potassium once daily, twice daily when taking hydrochlorothiazide. They plan to restart hydrochlorothiazide tomorrow.   Notes recurrent gout flares about once a month despite allopurinol. Manages with colchicine PRN.   Incidentally noted 31mm infrarenal AAA - established with [facility] Dr Lenell Antu as per below. Rec rpt abd aortic duplex in 2 yrs.   Home health not set up.  Other follow up  appointments scheduled: saw Dr Lenell Antu [facility] 08/28/2023 for infrarenal abdominal aortic aneurysm. Has GI appt with [facility] 10/26/2023.  ______________________________________________________________________ Hospital admission: 08/20/2023 Hospital discharge: 08/23/2023 TCM f/u phone call:  performed on 08/24/2023  Recommendations for Outpatient Follow-up:  Follow up with PCP in 1-2 weeks Please obtain CMP/CBC in one week Incidental finding-saccular infrarenal aneurysm-needs vascular surgery referral.  Discharge Diagnoses:  Principal Problem:   Colitis Active Problems:   Hyperlipidemia   GERD (gastroesophageal reflux disease)   Abdominal aortic aneurysm (AAA) (HCC)   Chronic pain   C. difficile colitis     Relevant past medical, surgical, family and social history reviewed and updated as indicated. Interim medical history since our last visit reviewed. Allergies and medications reviewed and updated. Outpatient Medications Prior to Visit  Medication Sig Dispense Refill   acetaminophen (TYLENOL) 650 MG CR tablet Take 650 mg by mouth 3 (three) times daily.     allopurinol (ZYLOPRIM) 100 MG tablet Take 1 tablet (100 mg total) by mouth daily. 90 tablet 3   amLODipine (NORVASC) 2.5 MG tablet TAKE TWO TABLETS BY MOUTH EVERY MORNING AND ONE TABLET IN THE AFTERNOON AS DIRECTED (Patient taking differently: Taking one tab bid) 270 tablet 3   aspirin 81 MG chewable tablet Chew 81 mg by mouth every evening.     benazepril (LOTENSIN) 20 MG tablet TAKE ONE TABLET BY MOUTH TWO TIMES DAILY 180 tablet 3   colchicine 0.6 MG tablet Take 2 tablets by mouth at gout onset, then take 1 tablet  daily until gout flare resolved (Patient taking differently: Take 2 tablets by mouth See admin instructions. Take 2 tablets by mouth at gout onset, then take 1 tablet daily until gout flare resolved) 60 tablet 0   cyanocobalamin (VITAMIN B12) 1000 MCG tablet Take 1 tablet (1,000 mcg total) by mouth every Monday, Wednesday,  and Friday.     docusate sodium (COLACE) 100 MG capsule Take 1 capsule (100 mg total) by mouth 2 (two) times daily. 180 capsule 3   famotidine (PEPCID) 20 MG tablet Take 1 tablet (20 mg total) by mouth 2 (two) times daily. (Patient taking differently: Take 40 mg by mouth at bedtime.) 180 tablet 4   ketoconazole (NIZORAL) 2 % cream Apply 1 application topically daily as needed for irritation.     loratadine (CLARITIN) 10 MG tablet Take 10 mg by mouth daily.     meloxicam (MOBIC) 7.5 MG tablet Take 7.5 mg by mouth daily.     polyethylene glycol powder (GLYCOLAX/MIRALAX) 17 GM/SCOOP powder TAKE 17 GRAMS BY MOUTH TWICE DAILY (Patient taking differently: Take 17 g by mouth at bedtime.) 510 g 2   potassium chloride SA (KLOR-CON M20) 20 MEQ tablet Take 1 tablet (20 mEq total) by mouth daily. (Patient taking differently: Take 20 mEq by mouth in the morning and at bedtime.) 90 tablet 3   simvastatin (ZOCOR) 10 MG tablet TAKE 1 TABLET BY MOUTH ONCE A DAY AT [facility] THE EVENING (Patient taking differently: Take 10 mg by mouth daily at 6 PM.) 90 tablet 3   tamsulosin (FLOMAX) 0.4 MG CAPS capsule TAKE ONE CAPSULE BY MOUTH ONCE DAILY AFTER SUPPER (Patient taking differently: Take 0.4 mg by mouth daily after supper.) 90 capsule 3   traMADol (ULTRAM) 50 MG tablet Take 50 mg by mouth at bedtime.     vancomycin (VANCOCIN) 125 MG capsule Take 1 capsule (125 mg total) by mouth 4 (four) times daily for 13 days. 52 capsule 0   hydrochlorothiazide (HYDRODIURIL) 25 MG tablet TAKE ONE TABLET BY MOUTH ONCE A DAY AS NEEDED FOR SWELLING (Patient not taking: Reported on 09/04/2023) 90 tablet 1   No facility-administered medications prior to visit.     Per HPI unless specifically indicated in ROS section below Review of Systems  Objective:  BP (!) 160/88   Pulse 75   Temp 98.6 F (37 C) (Oral)   Ht 5' 7.5" (1.715 m)   Wt 166 lb (75.3 kg)   SpO2 99%   BMI 25.62 kg/m   Wt Readings from Last 3 Encounters:  09/04/23 166  lb (75.3 kg)  08/28/23 166 lb 1.6 oz (75.3 kg)  08/20/23 169 lb 12.1 oz (77 kg)      Physical Exam Vitals and nursing note reviewed.  Constitutional:      Appearance: Normal appearance. He is not ill-appearing.  HENT:     Head: Normocephalic and atraumatic.     Mouth/Throat:     Mouth: Mucous membranes are moist.     Pharynx: Oropharynx is clear. No oropharyngeal exudate or posterior oropharyngeal erythema.  Eyes:     Extraocular Movements: Extraocular movements intact.     Pupils: Pupils are equal, round, and reactive to light.  Cardiovascular:     Rate and Rhythm: Normal rate and regular rhythm.     Pulses: Normal pulses.     Heart sounds: Normal heart sounds. No murmur heard. Pulmonary:     Effort: Pulmonary effort is normal. No respiratory distress.     Breath sounds: Normal  breath sounds. No wheezing, rhonchi or rales.  Abdominal:     General: Abdomen is flat. Bowel sounds are normal. There is no distension.     Palpations: Abdomen is soft. There is no mass.     Tenderness: There is no abdominal tenderness. There is no guarding or rebound.     Hernia: No hernia is present.  Musculoskeletal:     Right lower leg: No edema.     Left lower leg: No edema.  Skin:    General: Skin is warm and dry.     Findings: No rash.  Neurological:     Mental Status: He is alert.  Psychiatric:        Mood and Affect: Mood normal.        Behavior: Behavior normal.       Lab Results  Component Value Date   NA 136 08/22/2023   CL 106 08/22/2023   K 3.3 (L) 08/22/2023   CO2 24 08/22/2023   BUN 12 08/22/2023   CREATININE 0.89 08/22/2023   GFRNONAA >60 08/22/2023   CALCIUM 8.3 (L) 08/22/2023   ALBUMIN 3.8 08/20/2023   GLUCOSE 93 08/22/2023    Lab Results  Component Value Date   ALT 18 08/20/2023   AST 20 08/20/2023   ALKPHOS 55 08/20/2023   BILITOT 0.5 08/20/2023   Lab Results  Component Value Date   WBC 11.4 (H) 08/22/2023   HGB 11.1 (L) 08/22/2023   HCT 34.4 (L)  08/22/2023   MCV 96.6 08/22/2023   PLT 188 08/22/2023    Lab Results  Component Value Date   LABURIC 6.1 01/24/2023    Assessment & Plan:   Problem List Items Addressed This Visit     Essential hypertension    Chronic ,BP elevated in office today - BP log sheet provided to monitor at home, let us know if consistently above goal.  He currently takes amlodipine 2.5mg  BID with extra dose as needed, as well as benazepril 20mg  bid.  They are planning to restart hydrochlorothiazide tomorrow after completing antibiotic course - see below.       GERD (gastroesophageal reflux disease)    Overall stable period. Previously on PPI + pepcid, PPI was stopped after C diff colitis and pepcid increased to 20mg  BID/. Given stable period without symptoms on pepcid BID, will drop to 20mg  once daily.       Smokeless tobacco use    He quit during hospitalization. Encouraged ongoing attempts at full cessation.      Chronic gout    Notes frequent flares in setting of thiazide diuretic use despite daily allopurinol 100mg .  Consider spironolactone pending lab results today.       Relevant Orders   Uric acid   Infrarenal abdominal aortic aneurysm (AAA) without rupture (HCC)    Incidentally noted, has established with [facility] with plan repeat abd aortic duplex in 2 yrs      C. difficile colitis - Primary    Hospitalization records reviewed Completed oral vancomycin course  Provided post-colitis diet recommendations.  Symptoms improved. Update if recurrent symptoms develop.       Relevant Orders   Comprehensive metabolic panel   CBC with Differential/Platelet   Other Visit Diagnoses     Encounter for immunization       Relevant Orders   Flu Vaccine Trivalent High Dose (Fluad) (Completed)        No orders of the defined types were placed in this encounter.   Orders Placed This  Encounter  Procedures   Flu Vaccine Trivalent High Dose (Fluad)   Comprehensive metabolic panel   CBC with  Differential/Platelet   Uric acid    Patient Instructions  Flu shot today  Labs today  Check blood pressures at home - log sheet provided. Let us or Dr [place] know if consistently >150/90.  For C diff colitis - recommend low fiber diet like bland food (potatoes, white rice, eggs, chicken, toast, etc). Avoid spicy, greasy foods. Once feeling better, ok to return to high fiber diet.  Good to see you today Return after 01/31/2024 for medicare wellness visit  Follow up plan: Return if symptoms worsen or fail to improve.  Eustaquio Boyden, MD

## 2023-09-04 NOTE — Assessment & Plan Note (Signed)
Incidentally noted, has established with [facility] with plan repeat abd aortic duplex in 2 yrs

## 2023-09-04 NOTE — Assessment & Plan Note (Signed)
Notes frequent flares in setting of thiazide diuretic use despite daily allopurinol 100mg .  Consider spironolactone pending lab results today.

## 2023-09-04 NOTE — Patient Instructions (Addendum)
Flu shot today  Labs today  Check blood pressures at home - log sheet provided. Let us or Dr [place] know if consistently >150/90.  For C diff colitis - recommend low fiber diet like bland food (potatoes, white rice, eggs, chicken, toast, etc). Avoid spicy, greasy foods. Once feeling better, ok to return to high fiber diet.  Good to see you today Return after 01/31/2024 for medicare wellness visit

## 2023-09-05 LAB — CBC WITH DIFFERENTIAL/PLATELET
Basophils Absolute: 0 10*3/uL (ref 0.0–0.1)
Basophils Relative: 0.5 % (ref 0.0–3.0)
Eosinophils Absolute: 0 10*3/uL (ref 0.0–0.7)
Eosinophils Relative: 0.7 % (ref 0.0–5.0)
HCT: 40.1 % (ref 39.0–52.0)
Hemoglobin: 13.2 g/dL (ref 13.0–17.0)
Lymphocytes Relative: 21.3 % (ref 12.0–46.0)
Lymphs Abs: 1.6 10*3/uL (ref 0.7–4.0)
MCHC: 32.8 g/dL (ref 30.0–36.0)
MCV: 96 fL (ref 78.0–100.0)
Monocytes Absolute: 0.5 10*3/uL (ref 0.1–1.0)
Monocytes Relative: 6.4 % (ref 3.0–12.0)
Neutro Abs: 5.4 10*3/uL (ref 1.4–7.7)
Neutrophils Relative %: 71.1 % (ref 43.0–77.0)
Platelets: 311 10*3/uL (ref 150.0–400.0)
RBC: 4.18 Mil/uL — ABNORMAL LOW (ref 4.22–5.81)
RDW: 14.6 % (ref 11.5–15.5)
WBC: 7.6 10*3/uL (ref 4.0–10.5)

## 2023-09-13 ENCOUNTER — Telehealth: Payer: Self-pay | Admitting: Cardiology

## 2023-09-13 DIAGNOSIS — K219 Gastro-esophageal reflux disease without esophagitis: Secondary | ICD-10-CM

## 2023-09-13 DIAGNOSIS — I10 Essential (primary) hypertension: Secondary | ICD-10-CM

## 2023-09-13 NOTE — Telephone Encounter (Signed)
Wife calling in about seeing if the patient medication can be change. He has recently in the hospital and the dr suggest a change in medication. Wife is calling to see what the dr thinks. Please advise

## 2023-09-13 NOTE — Telephone Encounter (Signed)
Spoke to patient's wife who called about patient's Hydrochlorothiazide. She reports patient's PCP Dr  Sharen Hones suggested stopping medication. She stated Dr Sharen Hones think this medication may be the cause of patient's constant flares of gout. He is suggesting changing patient to Spironolactone. Please advise. She reports his BP reading is he is not consistent with taking his BP twice a day.    11/15  144/77 11/18  138/76

## 2023-09-14 ENCOUNTER — Other Ambulatory Visit: Payer: Self-pay

## 2023-09-14 MED ORDER — SPIRONOLACTONE 25 MG PO TABS: 25.0000 mg | ORAL_TABLET | Freq: Every day | ORAL | 3 refills | Status: DC

## 2023-09-14 NOTE — Telephone Encounter (Signed)
Can you specify dose and frequency? Thank you.

## 2023-09-14 NOTE — Telephone Encounter (Signed)
Adrian Davis, Peter M, MD  [facility] Triage; [name], LPN2 minutes ago (9:19 AM)   I would recommend 25 mg daily. Should have repeat BMET in 2 weeks. Should not take potassium supplement with this  [name] MD, [facility]

## 2023-09-14 NOTE — Telephone Encounter (Signed)
 Attempted to call patient, no answer left message requesting a call back.

## 2023-09-14 NOTE — Telephone Encounter (Signed)
Spoke to patient's wife Dr.Jordan advised ok to stop hydrochlorothiazide.Start Aldactone 25 mg daily.Stop KDur.Bmet to be done in 2weeks.Lab order mailed.

## 2023-09-15 DIAGNOSIS — Z23 Encounter for immunization: Secondary | ICD-10-CM | POA: Diagnosis not present

## 2023-09-27 DIAGNOSIS — Z6828 Body mass index (BMI) 28.0-28.9, adult: Secondary | ICD-10-CM | POA: Diagnosis not present

## 2023-09-27 DIAGNOSIS — M5416 Radiculopathy, lumbar region: Secondary | ICD-10-CM | POA: Diagnosis not present

## 2023-09-27 DIAGNOSIS — M48062 Spinal stenosis, lumbar region with neurogenic claudication: Secondary | ICD-10-CM | POA: Diagnosis not present

## 2023-09-28 DIAGNOSIS — I10 Essential (primary) hypertension: Secondary | ICD-10-CM | POA: Diagnosis not present

## 2023-09-29 LAB — BASIC METABOLIC PANEL
BUN/Creatinine Ratio: 21 (ref 10–24)
BUN: 17 mg/dL (ref 8–27)
CO2: 23 mmol/L (ref 20–29)
Calcium: 9.6 mg/dL (ref 8.6–10.2)
Chloride: 105 mmol/L (ref 96–106)
Creatinine, Ser: 0.8 mg/dL (ref 0.76–1.27)
Glucose: 121 mg/dL — ABNORMAL HIGH (ref 70–99)
Potassium: 4 mmol/L (ref 3.5–5.2)
Sodium: 142 mmol/L (ref 134–144)
eGFR: 89 mL/min/{1.73_m2} (ref >59)

## 2023-10-01 ENCOUNTER — Telehealth: Payer: Self-pay

## 2023-10-01 MED ORDER — VALSARTAN 320 MG PO TABS: 320.0000 mg | ORAL_TABLET | Freq: Every day | ORAL | 3 refills | Status: DC

## 2023-10-01 NOTE — Telephone Encounter (Signed)
Spoke to patient he stated his B/P is still elevated.Ranging 152/81 to 146/80.Pulse in 70's.He stopped hydrochlorothiazide on 11/23 and started Spironolactone 25 mg daily.He is taking all other medications as listed in chart.Advised I will send message to Dr.Jordan for advice.

## 2023-10-01 NOTE — Telephone Encounter (Signed)
Spoke to patient's wife Dr.Jordan's advice given.Patient will stop Benazepril and start Valsartan 320.mg daily.Continue all other medications.Advised to check B/P daily and call back in 2 weeks to report readings.

## 2023-10-04 ENCOUNTER — Other Ambulatory Visit: Payer: Self-pay | Admitting: Neurosurgery

## 2023-10-04 DIAGNOSIS — M5416 Radiculopathy, lumbar region: Secondary | ICD-10-CM

## 2023-10-08 ENCOUNTER — Encounter: Payer: Self-pay | Admitting: Podiatry

## 2023-10-08 ENCOUNTER — Ambulatory Visit (INDEPENDENT_AMBULATORY_CARE_PROVIDER_SITE_OTHER): Payer: Medicare Other | Admitting: Podiatry

## 2023-10-08 ENCOUNTER — Ambulatory Visit (INDEPENDENT_AMBULATORY_CARE_PROVIDER_SITE_OTHER): Payer: Medicare Other

## 2023-10-08 DIAGNOSIS — M7750 Other enthesopathy of unspecified foot: Secondary | ICD-10-CM | POA: Diagnosis not present

## 2023-10-08 DIAGNOSIS — G5791 Unspecified mononeuropathy of right lower limb: Secondary | ICD-10-CM | POA: Diagnosis not present

## 2023-10-08 DIAGNOSIS — M7751 Other enthesopathy of right foot: Secondary | ICD-10-CM | POA: Diagnosis not present

## 2023-10-08 MED ORDER — TRIAMCINOLONE ACETONIDE 40 MG/ML IJ SUSP
20.0000 mg | Freq: Once | INTRAMUSCULAR | Status: AC
  Administered 2023-10-08: 20 mg

## 2023-10-08 NOTE — Progress Notes (Signed)
He presents with his wife today chief complaint of pain to the medial midfoot.  States that his back doctor thinks it could be coming from his back radiating down his leg.  He states that he has no pain in the leg other than that that originates in the foot and shoots up the leg.  Objective: Vital signs are stable alert oriented x 3.  Pulses are palpable.  There is no erythema edema cellulitis drainage noted that he does have pain on palpation to the medial branch of the plantar tibial nerve from about mid diaphysis out to the toe.  Radiographs taken today demonstrate osseously mature individual no fracture abnormalities.  Very prominent os naviculare but is nontender on palpation.  Assessment: Neuritis medial plantar nerve.  Plan: I injected today with Kenalog and local anesthetic we will follow-up with him on an as-needed basis.

## 2023-10-25 ENCOUNTER — Other Ambulatory Visit: Payer: Medicare Other

## 2023-10-26 ENCOUNTER — Ambulatory Visit: Payer: Medicare Other | Admitting: Gastroenterology

## 2023-10-30 ENCOUNTER — Ambulatory Visit
Admission: RE | Admit: 2023-10-30 | Discharge: 2023-10-30 | Disposition: A | Discharge status: Home / Self Care | Payer: Medicare Other | Source: Ambulatory Visit | Attending: Neurosurgery | Admitting: Neurosurgery

## 2023-10-30 DIAGNOSIS — M5416 Radiculopathy, lumbar region: Secondary | ICD-10-CM

## 2023-10-30 DIAGNOSIS — M48061 Spinal stenosis, lumbar region without neurogenic claudication: Secondary | ICD-10-CM | POA: Diagnosis not present

## 2023-10-30 DIAGNOSIS — M4316 Spondylolisthesis, lumbar region: Secondary | ICD-10-CM | POA: Diagnosis not present

## 2023-12-05 DIAGNOSIS — M5416 Radiculopathy, lumbar region: Secondary | ICD-10-CM | POA: Diagnosis not present

## 2023-12-05 DIAGNOSIS — M48062 Spinal stenosis, lumbar region with neurogenic claudication: Secondary | ICD-10-CM | POA: Diagnosis not present

## 2023-12-07 ENCOUNTER — Other Ambulatory Visit: Payer: Self-pay | Admitting: Family Medicine

## 2023-12-07 DIAGNOSIS — Z96641 Presence of right artificial hip joint: Secondary | ICD-10-CM | POA: Diagnosis not present

## 2023-12-07 DIAGNOSIS — M7061 Trochanteric bursitis, right hip: Secondary | ICD-10-CM | POA: Diagnosis not present

## 2023-12-07 DIAGNOSIS — M1A9XX0 Chronic gout, unspecified, without tophus (tophi): Secondary | ICD-10-CM

## 2023-12-07 NOTE — Telephone Encounter (Signed)
E-scribed refill.  Plz schedule CPE and fasting lab (no foo/drink- except water and/or blk coffee 5 hrs prior) visits for additional refills.

## 2023-12-07 NOTE — Telephone Encounter (Signed)
Call pt and schedule a appt for cpe

## 2023-12-07 NOTE — Telephone Encounter (Signed)
Noted

## 2023-12-31 ENCOUNTER — Other Ambulatory Visit: Payer: Self-pay | Admitting: Cardiology

## 2024-01-02 ENCOUNTER — Telehealth: Payer: Self-pay | Admitting: Cardiology

## 2024-01-02 DIAGNOSIS — I10 Essential (primary) hypertension: Secondary | ICD-10-CM

## 2024-01-02 DIAGNOSIS — E7800 Pure hypercholesterolemia, unspecified: Secondary | ICD-10-CM

## 2024-01-02 NOTE — Telephone Encounter (Signed)
 New Message:     Patient's wife wants to know if patient need lab work before his appointment on 01-11-24?

## 2024-01-02 NOTE — Telephone Encounter (Signed)
Message sent to Dr.Jordan for advice. 

## 2024-01-03 NOTE — Telephone Encounter (Signed)
 Spoke to patient Dr.Jordan advised to have cmet and lipid panel done before appointment.Advised I mailed lab order yesterday.

## 2024-01-06 NOTE — Progress Notes (Unsigned)
 Adrian Davis Date of Birth: 08/26/1941 Medical Record #[id number]  History of Present Illness: [name] is seen today for pre op clearance for left TKR. He has a history of HTN and HLD. No known CAD. He had a normal Adenosine Myoview back in 2008. His last echo was in January of 2012 showing grade 1 diastolic dysfunction, mild LVH and a normal EF.    He  underwent right THR on 11/10/20 by Dr Despina Hick. 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 He has spinal stenosis. Has seen Dr Lovell Sheehan and surgery recommended for this as well. Patient doesn't want a plate placed because he was told he wouldn't be able to tie his shoes.   He did undergo a left TKR on 11/21/21.   He was admitted in October with left sided colitis and was treated for C diff. Noted to have 3.1 cm AAA.   He is doing well from a cardiac standpoint.  Denies any chest pain, SOB, palpitations, dizziness. Still having a lot of back pain and going down his leg.    Current Outpatient Medications on File Prior to Visit  Medication Sig Dispense Refill   acetaminophen (TYLENOL) 650 MG CR tablet Take 650 mg by mouth 3 (three) times daily.     allopurinol (ZYLOPRIM) 100 MG tablet TAKE ONE TABLET BY MOUTH ONCE A DAY 90 tablet 0   amLODipine (NORVASC) 2.5 MG tablet TAKE TWO TABLETS BY MOUTH EVERY MORNING AND ONE TABLET IN THE AFTERNOON AS DIRECTED (Patient taking differently: Taking one tab bid) 270 tablet 3   aspirin 81 MG chewable tablet Chew 81 mg by mouth every evening.     buprenorphine (BUTRANS) 5 MCG/HR PTWK 1 patch once a week.     colchicine 0.6 MG tablet Take 2 tablets by mouth at gout onset, then take 1 tablet daily until gout flare resolved (Patient taking differently: Take 2 tablets by mouth See admin instructions. Take 2 tablets by mouth at gout onset, then take 1 tablet daily until gout flare resolved) 60 tablet 0   cyanocobalamin (VITAMIN B12) 1000 MCG tablet Take 1 tablet (1,000 mcg total) by mouth every Monday, Wednesday, and  Friday.     docusate sodium (COLACE) 100 MG capsule Take 1 capsule (100 mg total) by mouth 2 (two) times daily. 180 capsule 3   famotidine (PEPCID) 20 MG tablet Take 1 tablet (20 mg total) by mouth 2 (two) times daily. (Patient taking differently: Take 40 mg by mouth at bedtime.) 180 tablet 4   ketoconazole (NIZORAL) 2 % cream Apply 1 application topically daily as needed for irritation.     loratadine (CLARITIN) 10 MG tablet Take 10 mg by mouth daily.     meloxicam (MOBIC) 7.5 MG tablet Take 7.5 mg by mouth daily.     polyethylene glycol powder (GLYCOLAX/MIRALAX) 17 GM/SCOOP powder TAKE 17 GRAMS BY MOUTH TWICE DAILY (Patient taking differently: Take 17 g by mouth at bedtime.) 510 g 2   simvastatin (ZOCOR) 10 MG tablet TAKE ONE TABLET BY MOUTH ONCE A DAY AT [facility] IN THE EVENING 90 tablet 3   spironolactone (ALDACTONE) 25 MG tablet Take 1 tablet (25 mg total) by mouth daily. 90 tablet 3   tamsulosin (FLOMAX) 0.4 MG CAPS capsule TAKE ONE CAPSULE BY MOUTH ONCE DAILY AFTER SUPPER (Patient taking differently: Take 0.4 mg by mouth daily after supper.) 90 capsule 3   traMADol (ULTRAM) 50 MG tablet Take 50 mg by mouth at bedtime.  valsartan (DIOVAN) 320 MG tablet Take 1 tablet (320 mg total) by mouth daily. 90 tablet 3   No current facility-administered medications on file prior to visit.    No Known Allergies  Past Medical History:  Diagnosis Date   Abnormal echocardiogram Jan 2012   grade 1 diastolic dysfunction, normal EF, mild LVH   Anemia    Basal cell carcinoma 04/08/2019   left nose inferior to nasal alar groove. EDC: 05/20/2019   Bell palsy 10/2010   presented with facial numbness   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    Degenerative disk disease    GERD (gastroesophageal reflux disease)    History of kidney stones    Hyperlipidemia    Hypertension    dr Topanga Alvelo [place]   Kidney cysts    Lumbar stenosis    Nephrolithiasis    Normal nuclear stress test 2008    Osteoarthritis    Skin cancer    Vocal cord leukoplakia    Dr Lazarus Salines    Past Surgical History:  Procedure Laterality Date   COLONOSCOPY  04/2019   TAx2, hemorrhoids, rpt 69yrs ([facility])   ESOPHAGOGASTRODUODENOSCOPY  04/2019   erosive gastropathy, biopsy negative for barrett's ([facility])   KNEE ARTHROSCOPY Right    LUMBAR LAMINECTOMY/DECOMPRESSION MICRODISCECTOMY N/A 11/12/2013   Cristi Loron, MD - complicated by urinary retention   MULTIPLE TOOTH EXTRACTIONS     SHOULDER SURGERY Bilateral    TONSILLECTOMY     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   Procedure: TOTAL HIP ARTHROPLASTY ANTERIOR APPROACH;  Surgeon: Ollen Gross, MD;  Location: [facility];  Service: Orthopedics;  Laterality: Right;    TOTAL KNEE ARTHROPLASTY Left 11/21/2021   Procedure: TOTAL KNEE ARTHROPLASTY;  Surgeon: Ollen Gross, MD;  Location: [facility];  Service: Orthopedics;  Laterality: Left;    Social History   Tobacco Use  Smoking Status Former   Current packs/day: 0.00   Average packs/day: 1.5 packs/day for 10.0 years (15.0 ttl pk-yrs)   Types: Cigarettes   Start date: 05/15/1966   Quit date: 05/15/1976   Years since quitting: 47.6  Smokeless Tobacco Current   Types: Chew  Tobacco Comments   chews tobacco    Social History   Substance and Sexual Activity  Alcohol Use Yes   Comment: rare    Family History  Problem Relation Age of Onset   Heart failure Father 39   Hypertension Father    Arthritis Father    Anemia Mother        needed regular transfusions   Prostate cancer Son    Cancer Neg Hx    Diabetes Neg Hx    Colon cancer Neg Hx    Rectal cancer Neg Hx    Stomach cancer Neg Hx    Inflammatory bowel disease Neg Hx    Liver disease Neg Hx    Pancreatic cancer Neg Hx     Review of Systems: The review of systems is per the HPI.  All other systems were reviewed and are negative.  Physical Exam: There were no vitals taken for this visit. GENERAL:  Well appearing WM  in NAD HEENT:  PERRL, EOMI, sclera are clear. Oropharynx is clear. NECK:  No jugular venous distention, carotid upstroke brisk and symmetric, no bruits, no thyromegaly or adenopathy LUNGS:  Clear to auscultation bilaterally CHEST:  Unremarkable HEART:  RRR,  PMI not displaced or sustained,S1 and S2 within normal limits, no S3, no S4: no clicks, no rubs, no murmurs ABD:  Soft, nontender. BS +, no masses or bruits. No hepatomegaly, no splenomegaly EXT:  2 + pulses throughout, no edema, no cyanosis no clubbing SKIN:  Warm and dry.  No rashes NEURO:  Alert and oriented x 3. Cranial nerves II through XII intact. PSYCH:  Cognitively intact   Laboratory data: Lab Results  Component Value Date   WBC 7.6 09/04/2023   HGB 13.2 09/04/2023   HCT 40.1 09/04/2023   PLT 311.0 09/04/2023   GLUCOSE 121 (H) 09/28/2023   CHOL 132 01/24/2023   TRIG 180.0 (H) 01/24/2023   HDL 37.30 (L) 01/24/2023   LDLDIRECT 102.0 05/15/2011   LDLCALC 59 01/24/2023   ALT 12 09/04/2023   AST 14 09/04/2023   NA 142 09/28/2023   K 4.0 09/28/2023   CL 105 09/28/2023   CREATININE 0.80 09/28/2023   BUN 17 09/28/2023   CO2 23 09/28/2023   TSH 1.86 03/14/2021   PSA 1.50 04/01/2019   INR 1.0 11/14/2021   HGBA1C 5.6 11/11/2021   Ecg not done today   Assessment / Plan: 1. HTN well controlled.  Continue amlodipine and lotensin.   2. Hyperlipidemia. Labs look good. Continue current therapy. Encourage a healthier diet and less sodium and fat.    Follow up in 6 months

## 2024-01-07 DIAGNOSIS — E7800 Pure hypercholesterolemia, unspecified: Secondary | ICD-10-CM | POA: Diagnosis not present

## 2024-01-07 DIAGNOSIS — I10 Essential (primary) hypertension: Secondary | ICD-10-CM | POA: Diagnosis not present

## 2024-01-07 LAB — COMPREHENSIVE METABOLIC PANEL
ALT: 13 IU/L (ref 0–44)
AST: 10 IU/L (ref 0–40)
Albumin: 4.5 g/dL (ref 3.7–4.7)
Alkaline Phosphatase: 65 IU/L (ref 44–121)
BUN/Creatinine Ratio: 32 — ABNORMAL HIGH (ref 10–24)
BUN: 27 mg/dL (ref 8–27)
Bilirubin Total: 0.2 mg/dL (ref 0.0–1.2)
CO2: 22 mmol/L (ref 20–29)
Calcium: 9.7 mg/dL (ref 8.6–10.2)
Chloride: 103 mmol/L (ref 96–106)
Creatinine, Ser: 0.85 mg/dL (ref 0.76–1.27)
Globulin, Total: 2.1 g/dL (ref 1.5–4.5)
Glucose: 90 mg/dL (ref 70–99)
Potassium: 4.3 mmol/L (ref 3.5–5.2)
Sodium: 137 mmol/L (ref 134–144)
Total Protein: 6.6 g/dL (ref 6.0–8.5)
eGFR: 87 mL/min/{1.73_m2} (ref >59)

## 2024-01-07 LAB — LIPID PANEL
Chol/HDL Ratio: 3.2 ratio (ref 0.0–5.0)
Cholesterol, Total: 147 mg/dL (ref 100–199)
HDL: 46 mg/dL (ref >39)
LDL Chol Calc (NIH): 78 mg/dL (ref 0–99)
Triglycerides: 132 mg/dL (ref 0–149)
VLDL Cholesterol Cal: 23 mg/dL (ref 5–40)

## 2024-01-08 DIAGNOSIS — M48062 Spinal stenosis, lumbar region with neurogenic claudication: Secondary | ICD-10-CM | POA: Diagnosis not present

## 2024-01-08 DIAGNOSIS — M4316 Spondylolisthesis, lumbar region: Secondary | ICD-10-CM | POA: Diagnosis not present

## 2024-01-11 ENCOUNTER — Encounter: Payer: Self-pay | Admitting: Cardiology

## 2024-01-11 ENCOUNTER — Ambulatory Visit: Payer: Medicare Other | Attending: Cardiology | Admitting: Cardiology

## 2024-01-11 VITALS — BP 112/70 | HR 80 | Ht 68.0 in | Wt 162.6 lb

## 2024-01-11 DIAGNOSIS — I10 Essential (primary) hypertension: Secondary | ICD-10-CM

## 2024-01-11 DIAGNOSIS — E7800 Pure hypercholesterolemia, unspecified: Secondary | ICD-10-CM | POA: Diagnosis not present

## 2024-01-11 NOTE — Patient Instructions (Signed)
 Medication Instructions:  Continue same medications *If you need a refill on your cardiac medications before your next appointment, please call your pharmacy*   Lab Work: None ordered   Testing/Procedures: None ordered   Follow-Up: At [facility], you and your health needs are our priority.  As part of our continuing mission to provide you with exceptional heart care, we have created designated Provider Care Teams.  These Care Teams include your primary Cardiologist (physician) and Advanced Practice Providers (APPs -  Physician Assistants and Nurse Practitioners) who all work together to provide you with the care you need, when you need it.  We recommend signing up for the patient portal called "MyChart".  Sign up information is provided on this After Visit Summary.  MyChart is used to connect with patients for Virtual Visits (Telemedicine).  Patients are able to view lab/test results, encounter notes, upcoming appointments, etc.  Non-urgent messages can be sent to your provider as well.   To learn more about what you can do with MyChart, go to [email or web address].    Your next appointment:  6 months   Call in June to schedule Sept appointment     Provider:  Dr.Jordan

## 2024-01-17 ENCOUNTER — Other Ambulatory Visit: Payer: Self-pay | Admitting: Neurosurgery

## 2024-01-17 DIAGNOSIS — M4316 Spondylolisthesis, lumbar region: Secondary | ICD-10-CM

## 2024-01-17 DIAGNOSIS — F4542 Pain disorder with related psychological factors: Secondary | ICD-10-CM | POA: Diagnosis not present

## 2024-01-18 ENCOUNTER — Other Ambulatory Visit: Payer: Self-pay | Admitting: Cardiology

## 2024-01-24 ENCOUNTER — Other Ambulatory Visit: Payer: Self-pay | Admitting: Family Medicine

## 2024-01-24 ENCOUNTER — Other Ambulatory Visit: Payer: Self-pay | Admitting: Cardiology

## 2024-01-24 DIAGNOSIS — E519 Thiamine deficiency, unspecified: Secondary | ICD-10-CM | POA: Insufficient documentation

## 2024-01-24 DIAGNOSIS — E538 Deficiency of other specified B group vitamins: Secondary | ICD-10-CM

## 2024-01-24 DIAGNOSIS — M1A9XX0 Chronic gout, unspecified, without tophus (tophi): Secondary | ICD-10-CM

## 2024-01-24 DIAGNOSIS — E7800 Pure hypercholesterolemia, unspecified: Secondary | ICD-10-CM

## 2024-01-25 ENCOUNTER — Other Ambulatory Visit: Payer: Medicare Other

## 2024-01-29 ENCOUNTER — Ambulatory Visit (INDEPENDENT_AMBULATORY_CARE_PROVIDER_SITE_OTHER)

## 2024-01-29 VITALS — BP 112/70 | Ht 68.0 in | Wt 164.0 lb

## 2024-01-29 DIAGNOSIS — Z2821 Immunization not carried out because of patient refusal: Secondary | ICD-10-CM | POA: Diagnosis not present

## 2024-01-29 DIAGNOSIS — Z0000 Encounter for general adult medical examination without abnormal findings: Secondary | ICD-10-CM | POA: Diagnosis not present

## 2024-01-29 NOTE — Patient Instructions (Signed)
 Adrian Davis , Thank you for taking time to come for your Medicare Wellness Visit. I appreciate your ongoing commitment to your health goals. Please review the following plan we discussed and let me know if I can assist you in the future.   Referrals/Orders/Follow-Ups/Clinician Recommendations: Follow up in one year for next AWV. Appointment has been scheduled.  This is a list of the screening recommended for you and due dates:  Health Maintenance  Topic Date Due   Zoster (Shingles) Vaccine (1 of 2) 05/25/1961   COVID-19 Vaccine (4 - 2024-25 season) 06/24/2023   Flu Shot  05/23/2024   Medicare Annual Wellness Visit  01/28/2025   Colon Cancer Screening  05/14/2026   Pneumonia Vaccine  Completed   HPV Vaccine  Aged Out   DTaP/Tdap/Td vaccine  Discontinued    Advanced directives: (In Chart) A copy of your advanced directives are scanned into your chart should your provider ever need it.  Next Medicare Annual Wellness Visit scheduled for next year: Yes

## 2024-01-29 NOTE — Progress Notes (Signed)
 Because this visit was a virtual/telehealth visit,  certain criteria was not obtained, such a blood pressure, CBG if applicable, and timed get up and go. Any medications not marked as "taking" were not mentioned during the medication reconciliation part of the visit. Any vitals not documented were not able to be obtained due to this being a telehealth visit or patient was unable to self-report a recent blood pressure reading due to a lack of equipment at home via telehealth. Vitals that have been documented are verbally provided by the patient.   Subjective:   Adrian Davis is a 83 y.o. who presents for a Medicare Wellness preventive visit.  Visit Complete: Virtual I connected with  Adrian Davis on 01/29/24 by a audio enabled telemedicine application and verified that I am speaking with the correct person using two identifiers.  Patient Location: Home  Provider Location: Home Office  I discussed the limitations of evaluation and management by telemedicine. The patient expressed understanding and agreed to proceed.  Vital Signs: Because this visit was a virtual/telehealth visit, some criteria may be missing or patient reported. Any vitals not documented were not able to be obtained and vitals that have been documented are patient reported.  VideoDeclined- This patient declined Librarian, academic. Therefore the visit was completed with audio only.  Persons Participating in Visit: Patient.  AWV Questionnaire: No: Patient Medicare AWV questionnaire was not completed prior to this visit.  Cardiac Risk Factors include: advanced age (>34men, >47 women);male gender;hypertension     Objective:    Today's Vitals   01/29/24 0822 01/29/24 0824  BP: 112/70   Weight: 164 lb (74.4 kg)   Height: 5\' 8"  (1.727 m)   PainSc:  9    Body mass index is 24.94 kg/m.     01/29/2024    9:34 AM 08/20/2023    7:41 PM 08/20/2023   10:44 AM 06/28/2023    8:52 AM 08/22/2022     8:45 AM 11/21/2021   12:02 PM 11/14/2021    9:12 AM  Advanced Directives  Does Patient Have a Medical Advance Directive? Yes  Yes No Yes Yes Yes  Type of Estate agent of [place];Living will Healthcare Power of [place];Living will Healthcare Power of [place];Living will  Healthcare Power of [place];Living will Healthcare Power of [place];Living will Healthcare Power of [place];Living will  Does patient want to make changes to medical advance directive? No - Patient declined Yes (Inpatient - patient defers changing a medical advance directive and declines information at this time) No - Patient declined  No - Patient declined No - Patient declined   Copy of Healthcare Power of Attorney in Chart? Yes - validated most recent copy scanned in chart (See row information) Yes - validated most recent copy scanned in chart (See row information) No - copy requested  Yes - validated most recent copy scanned in chart (See row information) No - copy requested No - copy requested  Would patient like information on creating a medical advance directive?   No - Patient declined No - Patient declined       Current Medications (verified) Outpatient Encounter Medications as of 01/29/2024  Medication Sig   acetaminophen (TYLENOL) 650 MG CR tablet Take 650 mg by mouth 3 (three) times daily.   allopurinol (ZYLOPRIM) 100 MG tablet TAKE ONE TABLET BY MOUTH ONCE A DAY   amLODipine (NORVASC) 2.5 MG tablet TAKE TWO TABLETS BY MOUTH EVERY MORNING AND ONE TABLET IN THE AFTERNOON AS  DIRECTED (Patient taking differently: Taking one tab bid)   aspirin 81 MG chewable tablet Chew 81 mg by mouth every evening.   buprenorphine (BUTRANS) 5 MCG/HR PTWK 1 patch once a week.   colchicine 0.6 MG tablet Take 2 tablets by mouth at gout onset, then take 1 tablet daily until gout flare resolved (Patient taking differently: Take 2 tablets by mouth See admin instructions. Take 2 tablets by mouth at gout onset, then take 1  tablet daily until gout flare resolved)   cyanocobalamin (VITAMIN B12) 1000 MCG tablet Take 1 tablet (1,000 mcg total) by mouth every Monday, Wednesday, and Friday.   docusate sodium (COLACE) 100 MG capsule Take 1 capsule (100 mg total) by mouth 2 (two) times daily.   famotidine (PEPCID) 20 MG tablet Take 1 tablet (20 mg total) by mouth 2 (two) times daily. (Patient taking differently: Take 40 mg by mouth at bedtime.)   ketoconazole (NIZORAL) 2 % cream Apply 1 application topically daily as needed for irritation.   loratadine (CLARITIN) 10 MG tablet Take 10 mg by mouth daily.   meloxicam (MOBIC) 7.5 MG tablet Take 7.5 mg by mouth daily.   polyethylene glycol powder (GLYCOLAX/MIRALAX) 17 GM/SCOOP powder TAKE 17 GRAMS BY MOUTH TWICE DAILY (Patient taking differently: Take 17 g by mouth at bedtime.)   simvastatin (ZOCOR) 10 MG tablet TAKE ONE TABLET BY MOUTH ONCE A DAY AT SIX IN THE EVENING   tamsulosin (FLOMAX) 0.4 MG CAPS capsule TAKE ONE CAPSULE BY MOUTH ONCE DAILY AFTER SUPPER (Patient taking differently: Take 0.4 mg by mouth daily after supper.)   traMADol (ULTRAM) 50 MG tablet Take 50 mg by mouth at bedtime.   valsartan (DIOVAN) 320 MG tablet Take 1 tablet (320 mg total) by mouth daily.   spironolactone (ALDACTONE) 25 MG tablet Take 1 tablet (25 mg total) by mouth daily.   No facility-administered encounter medications on file as of 01/29/2024.    Allergies (verified) Patient has no known allergies.   History: Past Medical History:  Diagnosis Date   Abnormal echocardiogram Jan 2012   grade 1 diastolic dysfunction, normal EF, mild LVH   Anemia    Basal cell carcinoma 04/08/2019   left nose inferior to nasal alar groove. EDC: 05/20/2019   Bell palsy 10/2010   presented with facial numbness   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    Degenerative disk disease    GERD (gastroesophageal reflux disease)    History of kidney stones    Hyperlipidemia    Hypertension    dr  [name]   Kidney cysts    Lumbar stenosis    Nephrolithiasis    Normal nuclear stress test 2008   Osteoarthritis    Skin cancer    Vocal cord leukoplakia    Dr Lazarus Salines   Past Surgical History:  Procedure Laterality Date   COLONOSCOPY  04/2019   TAx2, hemorrhoids, rpt 70yrs ([place])   ESOPHAGOGASTRODUODENOSCOPY  04/2019   erosive gastropathy, biopsy negative for barrett's ([place])   KNEE ARTHROSCOPY Right    LUMBAR LAMINECTOMY/DECOMPRESSION MICRODISCECTOMY N/A 11/12/2013   Cristi Loron, MD - complicated by urinary retention   MULTIPLE TOOTH EXTRACTIONS     SHOULDER SURGERY Bilateral    TONSILLECTOMY     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   Procedure: TOTAL HIP ARTHROPLASTY ANTERIOR APPROACH;  Surgeon: Ollen Gross, MD;  Location: [facility];  Service: Orthopedics;  Laterality: Right;    TOTAL KNEE ARTHROPLASTY Left 11/21/2021   Procedure: TOTAL  KNEE ARTHROPLASTY;  Surgeon: Ollen Gross, MD;  Location: [facility];  Service: Orthopedics;  Laterality: Left;   Family History  Problem Relation Age of Onset   Heart failure Father 4   Hypertension Father    Arthritis Father    Anemia Mother        needed regular transfusions   Prostate cancer Son    Cancer Neg Hx    Diabetes Neg Hx    Colon cancer Neg Hx    Rectal cancer Neg Hx    Stomach cancer Neg Hx    Inflammatory bowel disease Neg Hx    Liver disease Neg Hx    Pancreatic cancer Neg Hx    Social History   Socioeconomic History   Marital status: Married    Spouse name: Not on file   Number of children: 1   Years of education: Not on file   Highest education level: Not on file  Occupational History   Occupation: farmer  Tobacco Use   Smoking status: Former    Current packs/day: 0.00    Average packs/day: 1.5 packs/day for 10.0 years (15.0 ttl pk-yrs)    Types: Cigarettes    Start date: 05/15/1966    Quit date: 05/15/1976    Years since quitting: 47.7   Smokeless tobacco: Current     Types: Chew   Tobacco comments:    chews tobacco  Vaping Use   Vaping status: Never Used  Substance and Sexual Activity   Alcohol use: Yes    Comment: rare   Drug use: No   Sexual activity: Not on file  Other Topics Concern   Not on file  Social History Narrative   Lives with wife   Occ: farm   Edu: HS   Activity: active on farm   Diet: good water, fruits/vegetables daily   Social Drivers of Corporate investment banker Strain: Low Risk  (01/29/2024)   Overall Financial Resource Strain (CARDIA)    Difficulty of Paying Living Expenses: Not hard at all  Food Insecurity: No Food Insecurity (01/29/2024)   Hunger Vital Sign    Worried About Running Out of Food in the Last Year: Never true    Ran Out of Food in the Last Year: Never true  Transportation Needs: No Transportation Needs (01/29/2024)   PRAPARE - Administrator, Civil Service (Medical): No    Lack of Transportation (Non-Medical): No  Physical Activity: Insufficiently Active (01/29/2024)   Exercise Vital Sign    Days of Exercise per Week: 7 days    Minutes of Exercise per Session: 10 min  Stress: No Stress Concern Present (01/29/2024)   [employer] of Occupational Health - Occupational Stress Questionnaire    Feeling of Stress : Not at all  Social Connections: Socially Integrated (01/29/2024)   Social Connection and Isolation Panel [NHANES]    Frequency of Communication with Friends and Family: More than three times a week    Frequency of Social Gatherings with Friends and Family: More than three times a week    Attends Religious Services: More than 4 times per year    Active Member of [employer] or Organizations: Yes    Attends Engineer, structural: More than 4 times per year    Marital Status: Married    Tobacco Counseling Ready to quit: Not Answered Counseling given: Not Answered Tobacco comments: chews tobacco    Clinical Intake:  Pre-visit preparation completed: Yes  Pain : 0-10 Pain  Score: 9  Pain Type: Chronic pain Pain Location: Back Pain Orientation: Mid Pain Descriptors / Indicators: Aching, Sharp Pain Onset: Today Pain Frequency: Constant Pain Relieving Factors: sitting down pain at a 3  Pain Relieving Factors: sitting down pain at a 3  BMI - recorded: 24.94 Nutritional Status: BMI of 19-24  Normal Nutritional Risks: None Diabetes: No  Lab Results  Component Value Date   HGBA1C 5.6 11/11/2021   HGBA1C 5.8 11/02/2020   HGBA1C (H) 11/12/2010    5.7 (NOTE)                                                                       According to the ADA Clinical Practice Recommendations for 2011, when HbA1c is used as a screening test:   >=6.5%   Diagnostic of Diabetes Mellitus           (if abnormal result  is confirmed)  5.7-6.4%   Increased risk of developing Diabetes Mellitus  References:Diagnosis and Classification of Diabetes Mellitus,Diabetes Care,2011,34(Suppl 1):S62-S69 and Standards of Medical Care in         Diabetes - 2011,Diabetes Care,2011,34  (Suppl 1):S11-S61.     How often do you need to have someone help you when you read instructions, pamphlets, or other written materials from your doctor or pharmacy?: 1 - Never  Interpreter Needed?: No  Information entered by :: Ameyah Bangura,CMA   Activities of Daily Living     01/29/2024    8:28 AM 08/20/2023    7:41 PM  In your present state of health, do you have any difficulty performing the following activities:  Hearing? 0 0  Vision? 0 0  Difficulty concentrating or making decisions? 0 0  Walking or climbing stairs? 0   Dressing or bathing? 0   Doing errands, shopping? 0 0  Preparing Food and eating ? N   Using the Toilet? N   In the past six months, have you accidently leaked urine? N   Do you have problems with loss of bowel control? N   Managing your Medications? N   Managing your Finances? N   Housekeeping or managing your Housekeeping? N     Patient Care Team: Eustaquio Boyden,  MD as PCP - General (Family Medicine) [place], Peter M, MD as PCP - Cardiology (Cardiology) Kathyrn Sheriff, [facility] (Inactive) as Pharmacist (Pharmacist)  Indicate any recent Medical Services you may have received from other than [facility] providers in the past year (date may be approximate).     Assessment:   This is a routine wellness examination for [place].  Hearing/Vision screen Hearing Screening - Comments:: Patient has some hearing loss  Vision Screening - Comments:: Wears contacts   Goals Addressed             This Visit's Progress    Manage My Medicine   On track    Timeframe:  Long-Range Goal Priority:  Medium Start Date:    08/05/21                         Expected End Date: 08/05/22                      Follow Up Date April 2023   -  call for medicine refill 2 or 3 days before it runs out - call if I am sick and can't take my medicine - keep a list of all the medicines I take; vitamins and herbals too - use a pillbox to sort medicine  -Trial off of Myrbetriq to see if it is helping -Stop Benadryl and take Claritin instead -Use lowest effective dose of Aleve   Why is this important?   These steps will help you keep on track with your medicines.   Notes:        Depression Screen     01/29/2024    8:29 AM 09/04/2023   12:18 PM 12/06/2022    2:07 PM 08/22/2022    8:43 AM 01/03/2017   10:36 AM  PHQ 2/9 Scores  PHQ - 2 Score 0 0 4 0 0  PHQ- 9 Score 0 6 8      Fall Risk     01/29/2024    8:28 AM 01/24/2023    3:05 PM 12/06/2022    2:07 PM 08/22/2022    8:45 AM 09/09/2018    4:50 PM  Fall Risk   Falls in the past year? 0 0 0 0 0  Comment     Emmi Telephone Survey: data to providers prior to load  Number falls in past yr: 0 0  0   Injury with Fall? 0 0  0   Risk for fall due to : No Fall Risks No Fall Risks  No Fall Risks   Follow up Falls prevention discussed;Falls evaluation completed Falls evaluation completed  Falls prevention discussed     MEDICARE  RISK AT HOME:  Medicare Risk at Home Any stairs in or around the home?: Yes If so, are there any without handrails?: No Home free of loose throw rugs in walkways, pet beds, electrical cords, etc?: Yes Adequate lighting in your home to reduce risk of falls?: Yes Life alert?: Yes Use of a cane, walker or w/c?: No Grab bars in the bathroom?: No Shower chair or bench in shower?: Yes Elevated toilet seat or a handicapped toilet?: Yes  TIMED UP AND GO:  Was the test performed?  No  Cognitive Function: 6CIT completed        01/29/2024    8:26 AM 08/22/2022    8:45 AM  6CIT Screen  What Year? 0 points 0 points  What month? 0 points 0 points  What time? 0 points 0 points  Count back from 20 0 points 0 points  Months in reverse 0 points 0 points  Repeat phrase 0 points 0 points  Total Score 0 points 0 points    Immunizations Immunization History  Administered Date(s) Administered   Fluad Trivalent(High Dose 65+) 09/04/2023   Hepatitis A 10/03/2011, 04/10/2012   Influenza, High Dose Seasonal PF 08/27/2017, 08/21/2019, 10/26/2020   Influenza,inj,Quad PF,6+ Mos 07/24/2018   Influenza-Unspecified 08/23/2016   Moderna Sars-Covid-2 Vaccination 11/05/2019, 12/03/2019, 10/19/2020   Pneumococcal Conjugate-13 01/03/2017   Pneumococcal Polysaccharide-23 10/11/2011   Tdap 10/11/2011   Typhoid Inactivated 05/18/2017   Yellow Fever 05/18/2017   Zoster, Live 10/24/2011    Screening Tests Health Maintenance  Topic Date Due   Zoster Vaccines- Shingrix (1 of 2) 05/25/1961   COVID-19 Vaccine (4 - 2024-25 season) 06/24/2023   INFLUENZA VACCINE  05/23/2024   Medicare Annual Wellness (AWV)  01/28/2025   Colonoscopy  05/14/2026   Pneumonia Vaccine 78+ Years old  Completed   HPV VACCINES  Aged Out   DTaP/Tdap/Td  Discontinued    Health Maintenance  Health Maintenance Due  Topic Date Due   Zoster Vaccines- Shingrix (1 of 2) 05/25/1960   COVID-19 Vaccine (4 - 2024-25 season) 06/24/2023    Health Maintenance Items Addressed:declined vaccinations  Additional Screening:  Vision Screening: Recommended annual ophthalmology exams for [place] detection of glaucoma and other disorders of the eye.  Dental Screening: Recommended annual dental exams for proper oral hygiene  Community Resource Referral / Chronic Care Management: CRR required this visit?  No   CCM required this visit?  No     Plan:     I have personally reviewed and noted the following in the patient's chart:   Medical and social history Use of alcohol, tobacco or illicit drugs  Current medications and supplements including opioid prescriptions. Patient is not currently taking opioid prescriptions. Functional ability and status Nutritional status Physical activity Advanced directives List of other physicians Hospitalizations, surgeries, and ER visits in previous 12 months Vitals Screenings to include cognitive, depression, and falls Referrals and appointments  In addition, I have reviewed and discussed with patient certain preventive protocols, quality metrics, and best practice recommendations. A written personalized care plan for preventive services as well as general preventive health recommendations were provided to patient.     Rudi Heap, [place]   01/29/2024   After Visit Summary: (MyChart) Due to this being a telephonic visit, the after visit summary with patients personalized plan was offered to patient via MyChart   Notes: Nothing significant to report at this time.

## 2024-02-01 ENCOUNTER — Encounter: Payer: Self-pay | Admitting: Family Medicine

## 2024-02-01 ENCOUNTER — Ambulatory Visit (INDEPENDENT_AMBULATORY_CARE_PROVIDER_SITE_OTHER): Payer: Medicare Other | Admitting: Family Medicine

## 2024-02-01 VITALS — BP 128/76 | HR 75 | Temp 97.9°F | Ht 66.0 in | Wt 166.2 lb

## 2024-02-01 DIAGNOSIS — R3915 Urgency of urination: Secondary | ICD-10-CM

## 2024-02-01 DIAGNOSIS — E538 Deficiency of other specified B group vitamins: Secondary | ICD-10-CM

## 2024-02-01 DIAGNOSIS — K5909 Other constipation: Secondary | ICD-10-CM

## 2024-02-01 DIAGNOSIS — N401 Enlarged prostate with lower urinary tract symptoms: Secondary | ICD-10-CM

## 2024-02-01 DIAGNOSIS — E519 Thiamine deficiency, unspecified: Secondary | ICD-10-CM | POA: Diagnosis not present

## 2024-02-01 DIAGNOSIS — E7800 Pure hypercholesterolemia, unspecified: Secondary | ICD-10-CM

## 2024-02-01 DIAGNOSIS — K219 Gastro-esophageal reflux disease without esophagitis: Secondary | ICD-10-CM

## 2024-02-01 DIAGNOSIS — M5136 Other intervertebral disc degeneration, lumbar region with discogenic back pain only: Secondary | ICD-10-CM

## 2024-02-01 DIAGNOSIS — Z720 Tobacco use: Secondary | ICD-10-CM

## 2024-02-01 DIAGNOSIS — M5441 Lumbago with sciatica, right side: Secondary | ICD-10-CM

## 2024-02-01 DIAGNOSIS — I10 Essential (primary) hypertension: Secondary | ICD-10-CM

## 2024-02-01 DIAGNOSIS — M48062 Spinal stenosis, lumbar region with neurogenic claudication: Secondary | ICD-10-CM | POA: Diagnosis not present

## 2024-02-01 DIAGNOSIS — M1A9XX0 Chronic gout, unspecified, without tophus (tophi): Secondary | ICD-10-CM

## 2024-02-01 DIAGNOSIS — I7143 Infrarenal abdominal aortic aneurysm, without rupture: Secondary | ICD-10-CM

## 2024-02-01 DIAGNOSIS — G8929 Other chronic pain: Secondary | ICD-10-CM | POA: Diagnosis not present

## 2024-02-01 DIAGNOSIS — Z7189 Other specified counseling: Secondary | ICD-10-CM

## 2024-02-01 LAB — CBC WITH DIFFERENTIAL/PLATELET
Basophils Absolute: 0 10*3/uL (ref 0.0–0.1)
Basophils Relative: 0.4 % (ref 0.0–3.0)
Eosinophils Absolute: 0.1 10*3/uL (ref 0.0–0.7)
Eosinophils Relative: 1.1 % (ref 0.0–5.0)
HCT: 42.5 % (ref 39.0–52.0)
Hemoglobin: 14 g/dL (ref 13.0–17.0)
Lymphocytes Relative: 25 % (ref 12.0–46.0)
Lymphs Abs: 1.8 10*3/uL (ref 0.7–4.0)
MCHC: 32.8 g/dL (ref 30.0–36.0)
MCV: 95.4 fl (ref 78.0–100.0)
Monocytes Absolute: 0.5 10*3/uL (ref 0.1–1.0)
Monocytes Relative: 6.9 % (ref 3.0–12.0)
Neutro Abs: 4.8 10*3/uL (ref 1.4–7.7)
Neutrophils Relative %: 66.6 % (ref 43.0–77.0)
Platelets: 266 10*3/uL (ref 150.0–400.0)
RBC: 4.46 Mil/uL (ref 4.22–5.81)
RDW: 14.6 % (ref 11.5–15.5)
WBC: 7.2 10*3/uL (ref 4.0–10.5)

## 2024-02-01 LAB — URIC ACID: Uric Acid, Serum: 4.8 mg/dL (ref 4.0–7.8)

## 2024-02-01 LAB — VITAMIN B12: Vitamin B-12: 660 pg/mL (ref 211–911)

## 2024-02-01 MED ORDER — FAMOTIDINE 40 MG PO TABS
40.0000 mg | ORAL_TABLET | Freq: Every day | ORAL | 4 refills | Status: AC
Start: 2024-02-01 — End: ?

## 2024-02-01 MED ORDER — ALLOPURINOL 100 MG PO TABS: 100.0000 mg | ORAL_TABLET | Freq: Every day | ORAL | 4 refills | Status: AC

## 2024-02-01 MED ORDER — PANTOPRAZOLE SODIUM 40 MG PO TBEC: 40.0000 mg | DELAYED_RELEASE_TABLET | Freq: Every day | ORAL | 4 refills | Status: AC

## 2024-02-01 NOTE — Assessment & Plan Note (Signed)
 Update B1 levels - not on replacement.

## 2024-02-01 NOTE — Assessment & Plan Note (Signed)
Advanced directive discussion - has set up living will - Adrian Davis is wife. Will bring me copy.

## 2024-02-01 NOTE — Progress Notes (Signed)
 Ph: [phone] Fax: [phone]   Patient ID: Adrian Davis, male    DOB: [birth year], 83 y.o.   MRN: [id number]  This visit was conducted in person.  BP 128/76   Pulse 75   Temp 97.9 F (36.6 C) (Oral)   Ht 5\' 6"  (1.676 m)   Wt 166 lb 4 oz (75.4 kg)   SpO2 96%   BMI 26.83 kg/m    CC: AMW f/u visit  Subjective:   HPI: Adrian Davis is a 83 y.o. male presenting on 02/01/2024 for Annual Exam (MCR prt 2 [AWV- 01/29/24]. Pt accompanied by wife, Montel Antu.)   Saw health advisor earlier this week for medicare wellness visit. Note reviewed.   No results found.  Flowsheet Row Office Visit from 02/01/2024 in [facility]  PHQ-2 Total Score 0          02/01/2024   10:26 AM 01/29/2024    8:28 AM 01/24/2023    3:05 PM 12/06/2022    2:07 PM 08/22/2022    8:45 AM  Fall Risk   Falls in the past year? 0 0 0 0 0  Number falls in past yr:  0 0  0  Injury with Fall?  0 0  0  Risk for fall due to :  No Fall Risks No Fall Risks  No Fall Risks  Follow up  Falls prevention discussed;Falls evaluation completed Falls evaluation completed  Falls prevention discussed   C diff colitis hospitalization 07/2023 treated with oral vancomycin.  He regularly takes stool softener with miralax.   Chronic lower back pain followed by Dr Larrie Po neurosurgery - lumbar spondylolisthesis, LSS, neurogenic claudication. Pt declined lumbar decompression surgery, using lumbosacral corset. Planning spinal cord stimulator through Dr Crecencio Dodge [place] [facility]. Planned thoracic MRI this weekend with PM&R f/u.   HTN - followed by Dr [place] on amlodipine, spironolactone 25mg  daily, valsartan 320mg  daily.  31mm infrarenal AAA - sees [facility] Dr Edgardo Goodwill planned Q81yr imaging.   Preventative: Colonoscopy 04/2019 - rpt 7 yrs ([facility])  Prostate cancer screening - has seen urology Dr Inga Manges. Continues tamsulosin through Dr [place].  Lung cancer screening - not eligible - quit 1977  Flu shot -  yearly COVID - Moderna x4 Tdap 2012 Pneumovax 2012, prevnar-13 2018 Shingrix - thinks completed these at pharmacy Advanced directive discussion - has set up living will - Mathilda Solum is wife. Will bring me copy.  Smoking - 15 PY hx, quit 1977. Continues chewing tobacco  Alcohol - none  Seat belt use discussed  Sunscreen use discussed. 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 Interim medical history since our last visit reviewed. Allergies and medications reviewed and updated. Outpatient Medications Prior to Visit  Medication Sig Dispense Refill   acetaminophen (TYLENOL) 650 MG CR tablet Take 650 mg by mouth 3 (three) times daily.     amLODipine (NORVASC) 2.5 MG tablet TAKE TWO TABLETS BY MOUTH EVERY MORNING AND ONE TABLET IN THE AFTERNOON AS DIRECTED (Patient taking differently: Taking one tab bid) 270 tablet 3   aspirin 81 MG chewable tablet  Chew 81 mg by mouth every evening.     buprenorphine (BUTRANS) 5 MCG/HR PTWK 1 patch once a week.     colchicine 0.6 MG tablet Take 2 tablets by mouth at gout onset, then take 1 tablet daily until gout flare resolved (Patient taking differently: Take 2 tablets by mouth See admin instructions. Take 2 tablets by mouth at gout onset, then take 1 tablet daily until gout flare resolved) 60 tablet 0   cyanocobalamin (VITAMIN B12) 1000 MCG tablet Take 1 tablet (1,000 mcg total) by mouth every Monday, Wednesday, and Friday.     docusate sodium (COLACE) 100 MG capsule Take 1 capsule (100 mg total) by mouth 2 (two) times daily. 180 capsule 3   ketoconazole (NIZORAL) 2 % cream Apply 1 application topically  daily as needed for irritation.     loratadine (CLARITIN) 10 MG tablet Take 10 mg by mouth daily.     meloxicam (MOBIC) 7.5 MG tablet Take 7.5 mg by mouth daily.     polyethylene glycol powder (GLYCOLAX/MIRALAX) 17 GM/SCOOP powder TAKE 17 GRAMS BY MOUTH TWICE DAILY (Patient taking differently: Take 17 g by mouth at bedtime.) 510 g 2   simvastatin (ZOCOR) 10 MG tablet TAKE ONE TABLET BY MOUTH ONCE A DAY AT SIX IN THE EVENING 90 tablet 3   tamsulosin (FLOMAX) 0.4 MG CAPS capsule TAKE ONE CAPSULE BY MOUTH ONCE DAILY AFTER SUPPER (Patient taking differently: Take 0.4 mg by mouth daily after supper.) 90 capsule 3   traMADol (ULTRAM) 50 MG tablet Take 50 mg by mouth at bedtime.     valsartan (DIOVAN) 320 MG tablet Take 1 tablet (320 mg total) by mouth daily. 90 tablet 3   allopurinol (ZYLOPRIM) 100 MG tablet TAKE ONE TABLET BY MOUTH ONCE A DAY 90 tablet 0   famotidine (PEPCID) 20 MG tablet Take 1 tablet (20 mg total) by mouth 2 (two) times daily. (Patient taking differently: Take 40 mg by mouth at bedtime.) 180 tablet 4   spironolactone (ALDACTONE) 25 MG tablet Take 1 tablet (25 mg total) by mouth daily. 90 tablet 3   No facility-administered medications prior to visit.     Per HPI unless specifically indicated in ROS section below Review of Systems  Objective:  BP 128/76   Pulse 75   Temp 97.9 F (36.6 C) (Oral)   Ht 5\' 6"  (1.676 m)   Wt 166 lb 4 oz (75.4 kg)   SpO2 96%   BMI 26.83 kg/m   Wt Readings from Last 3 Encounters:  02/01/24 166 lb 4 oz (75.4 kg)  01/29/24 164 lb (74.4 kg)  01/11/24 162 lb 9.6 oz (73.8 kg)      Physical Exam Vitals and nursing note reviewed.  Constitutional:      General: He is not in acute distress.    Appearance: Normal appearance. He is well-developed. He is not ill-appearing.  HENT:     Head: Normocephalic and atraumatic.     Right Ear: Hearing, tympanic membrane, ear canal and external ear normal.     Left Ear: Hearing, tympanic membrane, ear canal  and external ear normal.  Eyes:     General: No scleral icterus.    Extraocular Movements: Extraocular movements intact.     Conjunctiva/sclera: Conjunctivae normal.     Pupils: Pupils are equal, round, and reactive to light.  Neck:     Thyroid: No thyroid mass or thyromegaly.  Cardiovascular:     Rate and Rhythm: Normal rate and regular rhythm.  Pulses: Normal pulses.          Radial pulses are 2+ on the right side and 2+ on the left side.     Heart sounds: Normal heart sounds. No murmur heard. Pulmonary:     Effort: Pulmonary effort is normal. No respiratory distress.     Breath sounds: Normal breath sounds. No wheezing, rhonchi or rales.  Abdominal:     General: Bowel sounds are normal. There is no distension.     Palpations: Abdomen is soft. There is no mass.     Tenderness: There is no abdominal tenderness. There is no guarding or rebound.     Hernia: No hernia is present.  Musculoskeletal:        General: Normal range of motion.     Cervical back: Normal range of motion and neck supple.     Right lower leg: No edema.     Left lower leg: No edema.     Comments:  Evidence of high arch and metatarsal collapse on right foot 2+ DP/PT on right  Lymphadenopathy:     Cervical: No cervical adenopathy.  Skin:    General: Skin is warm and dry.     Findings: No rash.  Neurological:     General: No focal deficit present.     Mental Status: He is alert and oriented to person, place, and time.  Psychiatric:        Mood and Affect: Mood normal.        Behavior: Behavior normal.        Thought Content: Thought content normal.        Judgment: Judgment normal.       Results for orders placed or performed in visit on 02/01/24  Vitamin B12   Collection Time: 02/01/24 11:17 AM  Result Value Ref Range   Vitamin B-12 660 211 - 911 pg/mL  CBC with Differential/Platelet   Collection Time: 02/01/24 11:17 AM  Result Value Ref Range   WBC 7.2 4.0 - 10.5 K/uL   RBC 4.46 4.22 - 5.81  Mil/uL   Hemoglobin 14.0 13.0 - 17.0 g/dL   HCT 96.2 95.2 - 84.1 %   MCV 95.4 78.0 - 100.0 fl   MCHC 32.8 30.0 - 36.0 g/dL   RDW 32.4 40.1 - 02.7 %   Platelets 266.0 150.0 - 400.0 K/uL   Neutrophils Relative % 66.6 43.0 - 77.0 %   Lymphocytes Relative 25.0 12.0 - 46.0 %   Monocytes Relative 6.9 3.0 - 12.0 %   Eosinophils Relative 1.1 0.0 - 5.0 %   Basophils Relative 0.4 0.0 - 3.0 %   Neutro Abs 4.8 1.4 - 7.7 K/uL   Lymphs Abs 1.8 0.7 - 4.0 K/uL   Monocytes Absolute 0.5 0.1 - 1.0 K/uL   Eosinophils Absolute 0.1 0.0 - 0.7 K/uL   Basophils Absolute 0.0 0.0 - 0.1 K/uL  Uric acid   Collection Time: 02/01/24 11:17 AM  Result Value Ref Range   Uric Acid, Serum 4.8 4.0 - 7.8 mg/dL    Assessment & Plan:   Problem List Items Addressed This Visit     Advanced care planning/counseling discussion - Primary (Chronic)   Advanced directive discussion - has set up living will - HCPOA is wife. Will bring me copy.       Hyperlipidemia   Chronic, stable on low dose simvastatin - continue.  Dose limited by drug interactions.  The ASCVD Risk score (Arnett DK, et al., 2019) failed to calculate  for the following reasons:   The 2019 ASCVD risk score is only valid for ages 38 to 68       Essential hypertension   Chronic, stable period on current regimen - continue.       Lumbar stenosis with neurogenic claudication   GERD (gastroesophageal reflux disease)   Chronic, stable period on pantoprazole 40mg  daily, pepcid 40mg  nightly.      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       Vitamin B12 deficiency   Update levels on 1000mcg MWF.       Chronic constipation   Chronic, stable period with regular miralax use      Smokeless tobacco use   Continues chewing - encouraged cessation.       DDD (degenerative  disc disease), lumbar   Chronic gout   Chronic, stable on allopurinol 100mg  daily without recent gout flare.       Relevant Medications   allopurinol (ZYLOPRIM) 100 MG tablet   Infrarenal abdominal aortic aneurysm (AAA) without rupture (HCC)   Followed by [facility] ([facility]) and cardiology ([place])      Chronic back pain   Regularly sees neurosurgery Dr Larrie Po as well as PM&R Dr Crecencio Dodge and Merri Abbe PA Has declined further lumbar surgery.  Discussing spinal cord stimulator placement vs RFA.  On buprenorphine patch through PM&R.       Thiamine deficiency   Update B1 levels - not on replacement.         Meds ordered this encounter  Medications   allopurinol (ZYLOPRIM) 100 MG tablet    Sig: Take 1 tablet (100 mg total) by mouth daily.    Dispense:  90 tablet    Refill:  4   famotidine (PEPCID) 40 MG tablet    Sig: Take 1 tablet (40 mg total) by mouth at bedtime.    Dispense:  90 tablet    Refill:  4   pantoprazole (PROTONIX) 40 MG tablet    Sig: Take 1 tablet (40 mg total) by mouth daily.    Dispense:  90 tablet    Refill:  4    Omeprazole ineffective    No orders of the defined types were placed in this encounter.   Patient Instructions  Labs today  Get dates of shingrix vaccines from [facility] pharmacy I've refilled medications including pantoprazole (Protonix) and famotidine (Pepcid).  Good to see you today Return as needed or in 1 year for next wellness visit.   Follow up plan: Return in about 1 year (around 01/31/2025) for medicare wellness visit.  Claire Crick, MD

## 2024-02-01 NOTE — Assessment & Plan Note (Signed)
 Update levels on MWF.

## 2024-02-01 NOTE — Patient Instructions (Addendum)
 Labs today  Get dates of shingrix vaccines from [facility] pharmacy I've refilled medications including pantoprazole (Protonix) and famotidine (Pepcid).  Good to see you today Return as needed or in 1 year for next wellness visit.

## 2024-02-02 ENCOUNTER — Encounter: Payer: Self-pay | Admitting: Family Medicine

## 2024-02-02 NOTE — Assessment & Plan Note (Addendum)
 Chronic, stable on low dose simvastatin - continue.  Dose limited by drug interactions.  The ASCVD Risk score (Arnett DK, et al., 2019) failed to calculate for the following reasons:   The 2019 ASCVD risk score is only valid for ages 64 to 74

## 2024-02-02 NOTE — Assessment & Plan Note (Signed)
 Chronic, stable period with regular miralax use

## 2024-02-02 NOTE — Assessment & Plan Note (Signed)
 Chronic, stable on allopurinol 100mg daily without recent gout flare.

## 2024-02-02 NOTE — Assessment & Plan Note (Addendum)
 Regularly sees neurosurgery Dr Larrie Po as well as PM&R Dr Crecencio Dodge and Merri Abbe PA Has declined further lumbar surgery.  Discussing spinal cord stimulator placement vs RFA.  On buprenorphine patch through PM&R.

## 2024-02-02 NOTE — Assessment & Plan Note (Signed)
 Followed by [facility] Edgardo Goodwill) and cardiology ([place])

## 2024-02-02 NOTE — Assessment & Plan Note (Signed)
Chronic, stable period on current regimen - continue.

## 2024-02-02 NOTE — Assessment & Plan Note (Addendum)
 Chronic, stable, previously saw urology, continues tamsulosin.

## 2024-02-02 NOTE — Assessment & Plan Note (Signed)
 Chronic, stable period on pantoprazole 40mg  daily, pepcid 40mg  nightly.

## 2024-02-02 NOTE — Assessment & Plan Note (Signed)
 Continues chewing - encouraged cessation.

## 2024-02-03 ENCOUNTER — Ambulatory Visit
Admission: RE | Admit: 2024-02-03 | Discharge: 2024-02-03 | Disposition: A | Discharge status: Home / Self Care | Source: Ambulatory Visit | Attending: Neurosurgery | Admitting: Neurosurgery

## 2024-02-03 DIAGNOSIS — M4316 Spondylolisthesis, lumbar region: Secondary | ICD-10-CM

## 2024-02-06 DIAGNOSIS — M48062 Spinal stenosis, lumbar region with neurogenic claudication: Secondary | ICD-10-CM | POA: Diagnosis not present

## 2024-02-06 DIAGNOSIS — M5416 Radiculopathy, lumbar region: Secondary | ICD-10-CM | POA: Diagnosis not present

## 2024-02-06 DIAGNOSIS — M4316 Spondylolisthesis, lumbar region: Secondary | ICD-10-CM | POA: Diagnosis not present

## 2024-02-06 LAB — VITAMIN B1: Vitamin B1 (Thiamine): 12 nmol/L (ref 8–30)

## 2024-02-16 ENCOUNTER — Ambulatory Visit
Admission: RE | Admit: 2024-02-16 | Discharge: 2024-02-16 | Disposition: A | Discharge status: Home / Self Care | Source: Ambulatory Visit | Attending: Neurosurgery | Admitting: Neurosurgery

## 2024-02-16 DIAGNOSIS — M5124 Other intervertebral disc displacement, thoracic region: Secondary | ICD-10-CM | POA: Diagnosis not present

## 2024-02-16 DIAGNOSIS — M4804 Spinal stenosis, thoracic region: Secondary | ICD-10-CM | POA: Diagnosis not present

## 2024-02-16 DIAGNOSIS — M47814 Spondylosis without myelopathy or radiculopathy, thoracic region: Secondary | ICD-10-CM | POA: Diagnosis not present

## 2024-02-21 ENCOUNTER — Telehealth: Payer: Self-pay | Admitting: Family Medicine

## 2024-02-21 DIAGNOSIS — M5416 Radiculopathy, lumbar region: Secondary | ICD-10-CM | POA: Diagnosis not present

## 2024-02-21 NOTE — Telephone Encounter (Signed)
 Noted. Thanks.

## 2024-02-21 NOTE — Telephone Encounter (Signed)
 I spoke with pt; pt said he just saw pain mgt doctor this morning and got 2 shots one on either side of his spine to see if that would help his back pain. Pt was also given tramadol  to take for pain and pt told pain mgt that tramadol  makes him hyper but pt said that tramadol  was only pain med pain mgt could give. I explained that pt would need to follow up with pain mgt about any pain med for back pain and pt said he understood and agreed with that. Pt said he takes at least 3 hours to go to sleep at night due to cannot relax and pt said he told pain mgt but they did not offer any med to help pt sleep., pt said he needs med to let his body relax so he can go to sleep and pt was thinking about taking nyquil or tsp of beer or liquor to help him sleep. Advised pt he should not take any med or drink any alcoholic beverage before speaking with pain mgt. Pt said he understood but he still wants to see someone at [facility] about helping him sleep at night. Pt did say he does sleep some during the day also. I advised pt to try to stay awake during the day and that might help sleeping more at night. Pt said he would keep his appt with Dr Vallarie Gauze on 02/22/24 at 3 PM and pt understands pt will need to get pain med thru pain mgt. UC & ED precautions also given and pt voiced understanding,. Sending note to Dr Vallarie Gauze.

## 2024-02-21 NOTE — Telephone Encounter (Signed)
 Patient is scheduled for OV tomorrow.  Please triage patient about pain/pain meds and sx related to pain med use.  Thanks.

## 2024-02-22 ENCOUNTER — Encounter: Payer: Self-pay | Admitting: Family Medicine

## 2024-02-22 ENCOUNTER — Ambulatory Visit (INDEPENDENT_AMBULATORY_CARE_PROVIDER_SITE_OTHER): Admitting: Family Medicine

## 2024-02-22 VITALS — BP 132/72 | HR 79 | Temp 98.6°F | Ht 66.0 in | Wt 164.2 lb

## 2024-02-22 DIAGNOSIS — G4700 Insomnia, unspecified: Secondary | ICD-10-CM

## 2024-02-22 MED ORDER — MELATONIN 5 MG PO TABS: 5.0000 mg | ORAL_TABLET | Freq: Every evening | ORAL | Status: DC | PRN

## 2024-02-22 MED ORDER — DIPHENHYDRAMINE HCL 25 MG PO CAPS
25.0000 mg | ORAL_CAPSULE | Freq: Every evening | ORAL | Status: DC | PRN
Start: 2024-02-22 — End: 2024-03-28

## 2024-02-22 MED ORDER — POLYETHYLENE GLYCOL 3350 17 GM/SCOOP PO POWD: 17.0000 g | Freq: Every day | ORAL | Status: AC

## 2024-02-22 MED ORDER — TRAZODONE HCL 50 MG PO TABS
25.0000 mg | ORAL_TABLET | Freq: Every evening | ORAL | 0 refills | Status: DC | PRN
Start: 2024-02-22 — End: 2024-03-28

## 2024-02-22 MED ORDER — TRAZODONE HCL 50 MG PO TABS: 25.0000 mg | ORAL_TABLET | Freq: Every evening | ORAL | Status: DC | PRN

## 2024-02-22 NOTE — Progress Notes (Unsigned)
 Off tramadol  (insomnia) and butrans patch (drowsy).    He had injection x2 yesterday, still with pain but improved some since yesterday.  His pain is manageable now but that is with minimal walking.  Pain is better when sitting, at baseline.   Last night he got about 5 hours of sleep.  He prev was getting about 2-3 hours of sleep.  He took oxazepam, wife's rx.    Last took tramadol  3 days ago.   Has bene off butrans patch for 4 days.    He had been taking benadryl  25mg .  That prev helped with sleep but not recently.    He has trouble getting to sleep and staying asleep.    Meds, vitals, and allergies reviewed.   ROS: Per HPI unless specifically indicated in ROS section

## 2024-02-22 NOTE — Patient Instructions (Addendum)
 Try taking 25mg  benadryl  at night.  You could take 5mg  melatonin with that.   If that isn't working, you could stop both and try taking 25-50mg  trazodone.  I would start with 25mg  first.   Update us  as needed.  I wouldn't take other sleep meds at this point.  I would stay off tramadol  and butrans.

## 2024-02-24 DIAGNOSIS — G4700 Insomnia, unspecified: Secondary | ICD-10-CM | POA: Insufficient documentation

## 2024-02-24 DIAGNOSIS — F5104 Psychophysiologic insomnia: Secondary | ICD-10-CM | POA: Insufficient documentation

## 2024-02-24 NOTE — Assessment & Plan Note (Signed)
 Likely affected by pain and recent medications.  See above.  Advised not to use benzodiazepines.  He can try taking 25mg  benadryl  at night.  He could take 5mg  melatonin with that.   If that isn't working, he could stop both and try taking 25-50mg  trazodone.  I would start with 25mg  first.   Update us  as needed.  I wouldn't take other sleep meds at this point.  I would stay off tramadol  and butrans.   He does not have a long history of insomnia so expect this to normalize.  Plan discussed with PCP.

## 2024-03-03 ENCOUNTER — Other Ambulatory Visit: Payer: Self-pay | Admitting: Cardiology

## 2024-03-06 DIAGNOSIS — M961 Postlaminectomy syndrome, not elsewhere classified: Secondary | ICD-10-CM | POA: Diagnosis not present

## 2024-03-18 ENCOUNTER — Telehealth: Payer: Self-pay

## 2024-03-18 NOTE — Telephone Encounter (Signed)
 Copied from CRM [id number]. Topic: Clinical - Medical Advice >> Mar 18, 2024  4:31 PM Baldo Levan wrote: Reason for CRM: Patient's wife called in stating that the patient is still experiencing trouble sleeping and the current medications are not working. Patient discussed this at the appointment on 5/2 with Richrd Char, and different medications were discussed but does not seem to help. Patient's wife did schedule an appointment but would like to see if Dr. Crissie Dome can do anything over the phone or without having to come into the office again. Patient declined a video visit.

## 2024-03-20 NOTE — Telephone Encounter (Signed)
 Copied from CRM [id number]. Topic: General - Other >> Mar 20, 2024  2:57 PM Aisha D wrote: Reason for CRM: Pt stated that he was speaking with Marlis Simper about the medication he is taking and wants to be prescribed. Pt stated the name of the medication is ozepam 10mg .

## 2024-03-20 NOTE — Telephone Encounter (Signed)
 Spoke with pt asking about trazodone  rx and sleep issues. Pt states he's not taking trazodone  and that he has trouble staying asleep due to leg cramps. Says his wife gave him 3 tablets of a 5 mg medication last night that helped him sleep and he really liked it. I asked was it melatonin, pt could not say for sure. Says when he gets home, he will call us  back with name of medication. Fyi to Dr Crissie Dome.

## 2024-03-20 NOTE — Telephone Encounter (Addendum)
 Looks like tramadol  worsened insomnia.  Has tried benadryl  and melatonin.  Was prescribed trazodone  - is he taking 25mg  or 50mg ? Would try up to 50mg  - let me know if ineffective. Is he having more trouble falling asleep or staying asleep? We could try rozerem sleep medicine which is melatonin agonist. Let me know if he'd like this sent in.

## 2024-03-21 NOTE — Telephone Encounter (Signed)
Spoke with pt relaying Dr. G's message. Pt verbalizes understanding.  

## 2024-03-21 NOTE — Telephone Encounter (Addendum)
 Recommend we discuss options at office visit 6/6.  Don't recommend he take wife's medications.

## 2024-03-24 ENCOUNTER — Other Ambulatory Visit (INDEPENDENT_AMBULATORY_CARE_PROVIDER_SITE_OTHER)

## 2024-03-24 ENCOUNTER — Other Ambulatory Visit: Payer: Self-pay

## 2024-03-24 ENCOUNTER — Encounter: Payer: Self-pay | Admitting: Sports Medicine

## 2024-03-24 ENCOUNTER — Ambulatory Visit (INDEPENDENT_AMBULATORY_CARE_PROVIDER_SITE_OTHER): Admitting: Sports Medicine

## 2024-03-24 DIAGNOSIS — M25512 Pain in left shoulder: Secondary | ICD-10-CM

## 2024-03-24 DIAGNOSIS — M25511 Pain in right shoulder: Secondary | ICD-10-CM | POA: Diagnosis not present

## 2024-03-24 DIAGNOSIS — M19012 Primary osteoarthritis, left shoulder: Secondary | ICD-10-CM

## 2024-03-24 DIAGNOSIS — S46212S Strain of muscle, fascia and tendon of other parts of biceps, left arm, sequela: Secondary | ICD-10-CM

## 2024-03-24 DIAGNOSIS — G8929 Other chronic pain: Secondary | ICD-10-CM

## 2024-03-24 DIAGNOSIS — M48062 Spinal stenosis, lumbar region with neurogenic claudication: Secondary | ICD-10-CM | POA: Diagnosis not present

## 2024-03-24 MED ORDER — BUPIVACAINE HCL 0.25 % IJ SOLN
2.0000 mL | INTRAMUSCULAR | Status: AC | PRN
Start: 2024-03-24 — End: 2024-03-24
  Administered 2024-03-24: 2 mL via INTRA_ARTICULAR

## 2024-03-24 MED ORDER — LIDOCAINE HCL 1 % IJ SOLN
2.0000 mL | INTRAMUSCULAR | Status: AC | PRN
  Administered 2024-03-24: 2 mL

## 2024-03-24 MED ORDER — METHYLPREDNISOLONE ACETATE 40 MG/ML IJ SUSP
80.0000 mg | INTRAMUSCULAR | Status: AC | PRN
  Administered 2024-03-24: 80 mg via INTRA_ARTICULAR

## 2024-03-24 NOTE — Progress Notes (Signed)
 Adrian Davis - 83 y.o. male MRN [id number]  Date of birth: November 26, 1940  Office Visit Note: Visit Date: 03/24/2024 PCP: Claire Crick, MD Referred by: Claire Crick, MD  Subjective: Chief Complaint  Patient presents with   Left Shoulder - Pain   HPI: Adrian Davis is a pleasant 82 y.o. male who presents today for acute on chronic left shoulder pain with a history of bilateral shoulder arthroscopy years ago.  He has had bilateral shoulder pain for years, with a history of bilateral shoulder arthroscopy with rotator cuff surgery by Dr. Aviva Lemmings years ago.  He was better with this but his left has never quite been well. 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  He has pain within the shoulder joint, denies any numbness or tingling going down the arm.  He takes Tylenol  daily as well as is managed on oral meloxicam  7.5 mg daily for this and low back pathology.  He is seeing Dr. Larrie Po and there planning on spinal cord stimulator as a trial prior to surgical intervention.  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  Pertinent ROS were reviewed with the patient and found to be negative unless otherwise specified above in HPI.   Assessment & Plan: Visit Diagnoses:  1. Primary osteoarthritis, left shoulder   2. Chronic pain of both shoulders   3. Rupture of left proximal biceps tendon, sequela   4. Lumbar stenosis with neurogenic claudication    Plan: Impression is chronic bilateral shoulder pain in the setting of prior rotator cuff arthroscopy with an acute exacerbation of his left shoulder advanced osteoarthritis. 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  Through shared decision making, we did proceed with ultrasound-guided intra-articular left shoulder injection, patient tolerated well.   Advised on postinjection protocol and modified rest/activity for the next 48 hours.  He may continue his meloxicam  7.5 mg daily and may use his Tylenol  and/or ice or heat for any postinjection pain.  He will follow-up with me over the next 4 to 6 weeks if he does not get considerable relief from today's injection, otherwise f/u PRN.  He will continue follow-up with Dr. Larrie Po and team for his low back pathology.  Follow-up: Return for 4-6 weeks as needed if not better from injection .   Meds & Orders: No orders of the defined types were placed in this encounter.   Orders Placed This Encounter  Procedures   Large Joint Inj: L glenohumeral   XR Shoulder Left   US  Guided Needle Placement - No Linked Charges     Procedures: Large Joint Inj: L glenohumeral on 03/24/2024 2:31 PM Indications: pain Details: 22 G 3.5 in needle, ultrasound-guided posterior approach Medications: 2 mL lidocaine  1 %; 2 mL bupivacaine  0.25 %; 80 mg methylPREDNISolone  acetate 40 MG/ML Outcome: tolerated well, no immediate complications  US -guided glenohumeral joint injection, left shoulder After discussion on risks/benefits/indications, informed verbal consent was obtained. A timeout was then performed. The patient was positioned lying lateral recumbent on examination table. The patient's shoulder was prepped with betadine  and multiple alcohol swabs  and utilizing ultrasound guidance, the patient's glenohumeral joint was identified on ultrasound. Using ultrasound guidance a 22-gauge, 3.5 inch needle with a mixture of 2:2:2 cc's lidocaine :bupivicaine:depomedrol was directed from a lateral to medial direction via in-plane technique  into the glenohumeral joint with visualization of appropriate spread of injectate into the joint. Patient tolerated the procedure well without immediate complications.      Procedure, treatment alternatives, risks and benefits explained, specific risks discussed. Consent was given by the patient.  Immediately prior to procedure a time out was called to verify the correct patient, procedure, equipment, support staff and site/side marked as required. Patient was prepped and draped in the usual sterile fashion.          Clinical History: No specialty comments available.  He reports that he quit smoking about 47 years ago. His smoking use included cigarettes. He started smoking about 57 years ago. He has a 15 pack-year smoking history. His smokeless tobacco use includes chew.  Recent Labs    09/04/23 1210 02/01/24 1117  LABURIC 5.2 4.8    Objective:   Vital Signs: There were no vitals taken for this visit.  Physical Exam  Gen: Well-appearing, in no acute distress; non-toxic CV: Well-perfused. Warm.  Resp: Breathing unlabored on room air; no wheezing. Psych: Fluid speech in conversation; appropriate affect; normal thought process  Ortho Exam - Bilateral shoulders: There is restriction in both passive and active range of motion of the left > right shoulder.  There is a mild grating sensation through end range of motion of the left shoulder.  Testing with resisted internal/external and abduction shows no gross weakness with rotator cuff testing.  There is limited internal range of motion at the 90/90 position of the left shoulder.  Imaging: XR Shoulder Left Result Date: 03/24/2024 4 view x-ray of the left shoulder including AP, Grashey, scapular Y and axial view were ordered and reviewed by myself.  X-rays demonstrate advanced glenohumeral arthritic change with flattening of the humeral head.  There is bone-on-bone arthritic change over the inferior aspect of the glenohumeral joint and acetabulum with subchondral cystic change and a moderate-sized spur off the humeral head.  There is calcification off the acromion which is likely in the setting of prior distal clavicle excision versus os acromiale.  No acute fracture noted.   Past Medical/Family/Surgical/Social History: Medications &  Allergies reviewed per EMR, new medications updated. Patient Active Problem List   Diagnosis Date Noted   Insomnia 02/24/2024   Thiamine  deficiency 01/24/2024   Infrarenal abdominal aortic aneurysm (AAA) without rupture (HCC) 08/20/2023   Chronic back pain 08/20/2023   Hammer toe of right foot 12/07/2022   Chronic gout 11/17/2022   Primary osteoarthritis of left knee 11/21/2021   OA (osteoarthritis) of knee 10/11/2021   DDD (degenerative disc disease), lumbar 10/04/2021   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   Fatigue 03/14/2021   Primary osteoarthritis of right hip 11/10/2020   Pre-op evaluation 11/04/2020   Smokeless tobacco use 11/04/2020   History of urinary retention 11/04/2020   Chronic constipation 01/21/2020   Nephrolithiasis 11/14/2018   Acquired complex renal cyst 11/14/2018   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   Vitamin B12 deficiency 01/21/2018   Actinic keratosis 01/16/2017   Medicare annual wellness visit, subsequent 01/03/2017   Advanced care planning/counseling discussion 01/03/2017   Vocal cord leukoplakia    Overweight (BMI 25.0-29.9) 02/22/2016   Paresthesias 02/22/2016   GERD (gastroesophageal reflux disease)    OA (osteoarthritis) of hip    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   Hyperlipidemia    Essential hypertension    Past Medical History:  Diagnosis Date  Abnormal echocardiogram 10/2010   grade 1 diastolic dysfunction, normal EF, mild LVH   Anemia    Basal cell carcinoma 04/08/2019   left nose inferior to nasal alar groove. EDC: 05/20/2019   Bell palsy 10/2010   presented with facial numbness   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    C. difficile colitis 08/22/2023   Treated with oral vancymycin     Degenerative disk disease    GERD (gastroesophageal reflux disease)    History of kidney stones     Hyperlipidemia    Hypertension    dr [name]   Kidney cysts    Lumbar stenosis    Nephrolithiasis    Normal nuclear stress test 2008   Osteoarthritis    Skin cancer    Vocal cord leukoplakia    Dr Archer Kobs   Family History  Problem Relation Age of Onset   Heart failure Father 38   Hypertension Father    Arthritis Father    Anemia Mother        needed regular transfusions   Prostate cancer Son    Cancer Neg Hx    Diabetes Neg Hx    Colon cancer Neg Hx    Rectal cancer Neg Hx    Stomach cancer Neg Hx    Inflammatory bowel disease Neg Hx    Liver disease Neg Hx    Pancreatic cancer Neg Hx    Past Surgical History:  Procedure Laterality Date   COLONOSCOPY  04/2019   TAx2, hemorrhoids, rpt 26yrs ([place])   ESOPHAGOGASTRODUODENOSCOPY  04/2019   erosive gastropathy, biopsy negative for barrett's ([place])   KNEE ARTHROSCOPY Right    LUMBAR LAMINECTOMY/DECOMPRESSION MICRODISCECTOMY N/A 11/12/2013   Elder Greening, MD - complicated by urinary retention   MULTIPLE TOOTH EXTRACTIONS     SHOULDER SURGERY Bilateral    TONSILLECTOMY     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   Procedure: TOTAL HIP ARTHROPLASTY ANTERIOR APPROACH;  Surgeon: Liliane Rei, MD;  Location: [facility];  Service: Orthopedics;  Laterality: Right;    TOTAL KNEE ARTHROPLASTY Left 11/21/2021   Procedure: TOTAL KNEE ARTHROPLASTY;  Surgeon: Liliane Rei, MD;  Location: [facility];  Service: Orthopedics;  Laterality: Left;   Social History   Occupational History   Occupation: farmer  Tobacco Use   Smoking status: Former    Current packs/day: 0.00    Average packs/day: 1.5 packs/day for 10.0 years (15.0 ttl pk-yrs)    Types: Cigarettes    Start date: 05/15/1966    Quit date: 05/15/1976    Years since quitting: 47.8   Smokeless tobacco: Current    Types: Chew   Tobacco comments:    chews tobacco  Vaping Use   Vaping status: Never Used  Substance and Sexual Activity   Alcohol use: Yes     Comment: rare   Drug use: No   Sexual activity: Not on file

## 2024-03-24 NOTE — Progress Notes (Signed)
 Patient says that he has had left shoulder pain for years. He had surgery for both shoulders years ago, and says that the left didn't seem to do as well as the right. He says that he has a popeye deformity in the left arm. He takes Tylenol  daily which is not very helpful for the shoulder pain, as well as pain patches. He has had a shoulder injection in the past, and is inquiring about one today. 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

## 2024-03-28 ENCOUNTER — Other Ambulatory Visit (HOSPITAL_COMMUNITY): Payer: Self-pay

## 2024-03-28 ENCOUNTER — Ambulatory Visit (INDEPENDENT_AMBULATORY_CARE_PROVIDER_SITE_OTHER): Admitting: Family Medicine

## 2024-03-28 ENCOUNTER — Encounter: Payer: Self-pay | Admitting: Family Medicine

## 2024-03-28 ENCOUNTER — Telehealth: Payer: Self-pay

## 2024-03-28 VITALS — BP 120/64 | HR 99 | Temp 98.5°F | Ht 66.0 in | Wt 171.1 lb

## 2024-03-28 DIAGNOSIS — M51362 Other intervertebral disc degeneration, lumbar region with discogenic back pain and lower extremity pain: Secondary | ICD-10-CM

## 2024-03-28 DIAGNOSIS — M545 Low back pain, unspecified: Secondary | ICD-10-CM

## 2024-03-28 DIAGNOSIS — M79605 Pain in left leg: Secondary | ICD-10-CM | POA: Diagnosis not present

## 2024-03-28 DIAGNOSIS — Z87898 Personal history of other specified conditions: Secondary | ICD-10-CM

## 2024-03-28 DIAGNOSIS — M79604 Pain in right leg: Secondary | ICD-10-CM | POA: Diagnosis not present

## 2024-03-28 DIAGNOSIS — F5104 Psychophysiologic insomnia: Secondary | ICD-10-CM

## 2024-03-28 DIAGNOSIS — G8929 Other chronic pain: Secondary | ICD-10-CM

## 2024-03-28 MED ORDER — OXAZEPAM 10 MG PO CAPS: 10.0000 mg | ORAL_CAPSULE | Freq: Every evening | ORAL | 0 refills | Status: DC | PRN

## 2024-03-28 NOTE — Telephone Encounter (Signed)
 Pharmacy Patient Advocate Encounter   Received notification from CoverMyMeds that prior authorization for Oxazepam 10MG  capsules is required/requested.   Insurance verification completed.   The patient is insured through [employer] .   Per test claim: PA required; PA started via CoverMyMeds. KEY [id number] . Waiting for clinical questions to populate.

## 2024-03-28 NOTE — Progress Notes (Signed)
 Ph: [phone] Fax: [phone]   Patient ID: Adrian Davis, male    DOB: 1941-10-06, 83 y.o.   MRN: [id number]  This visit was conducted in person.  BP 120/64   Pulse 99   Temp 98.5 F (36.9 C) (Oral)   Ht 5\' 6"  (1.676 m)   Wt 171 lb 2 oz (77.6 kg)   SpO2 96%   BMI 27.62 kg/m    CC: discuss insomnia , leg cramps  Subjective:   HPI: Adrian Davis is a 83 y.o. male presenting on 03/28/2024 for Medical Management of Chronic Issues (C/o ongoing insomnia and leg cramps. Pt accompanied by wife, Montel Antu. 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   Insomnia ongoing for a year, worsened since back pain worsened - several visits for this concern. Both sleep initiation and maintenance insomnia.   Has had several steroid injections (back, shoulder).  Feels RLS, stress, anxiety, pain, steroid shots all contributing to trouble sleeping.  Wife notes he moves his legs move a lot at night even when he's sleeping.   Limits napping.  Has bedtime routine.  No alcohol. Limits caffeine.  Supper 7pm, bedtime 10pm.   Previous meds tried: Benadryl  25mg  - effect wore off  Melatonin 5mg  - caused nocturia  Trazodone  25-50mg  - poor effect  Took wife's oxazepam 10mg  at night x3 with benefit   Trouble staying asleep due to leg cramping.  Notes ongoing nocturia despite flomax .   Chronic lower back pain s/p steroid injection latest 02/06/2024.  Pending spinal cord stimulator placement trial for July - planning to reschedule this.  Butrans 10mg  patches were too strong - he stopped this.  He's using salon pas patches.  He's not taking tramadol  either.      Relevant past medical, surgical, family and social history reviewed and updated as indicated. Interim medical history since our last visit reviewed. Allergies and medications reviewed and updated. Outpatient Medications Prior to Visit  Medication Sig Dispense Refill   acetaminophen  (TYLENOL ) 650 MG CR  tablet Take 650 mg by mouth 3 (three) times daily.     allopurinol  (ZYLOPRIM ) 100 MG tablet Take 1 tablet (100 mg total) by mouth daily. 90 tablet 4   amLODipine  (NORVASC ) 2.5 MG tablet TAKE TWO TABLETS BY MOUTH EVERY MORNING AND ONE TABLET IN THE AFTERNOON AS DIRECTED 270 tablet 3   aspirin 81 MG chewable tablet Chew 81 mg by mouth every evening.     colchicine  0.6 MG tablet Take 2 tablets by mouth at gout onset, then take 1 tablet daily until gout flare resolved 60 tablet 0   cyanocobalamin  (VITAMIN B12) 1000 MCG tablet Take 1 tablet (1,000 mcg total) by mouth every Monday, Wednesday, and Friday.     docusate sodium  (COLACE) 100 MG capsule Take 1 capsule (100 mg total) by mouth 2 (two) times daily. 180 capsule 3   famotidine  (PEPCID ) 40 MG tablet Take 1 tablet (40 mg total) by mouth at bedtime. 90 tablet 4   ketoconazole (NIZORAL) 2 % cream Apply 1 application topically daily as needed for irritation.     loratadine (CLARITIN) 10 MG tablet Take 10 mg by mouth daily.     meloxicam  (MOBIC ) 7.5 MG tablet Take 7.5 mg by mouth daily.     pantoprazole  (PROTONIX ) 40 MG tablet Take 1 tablet (40 mg total) by mouth daily. 90 tablet 4   polyethylene glycol powder (GLYCOLAX /MIRALAX ) 17 GM/SCOOP powder Take 17 g by  mouth at bedtime.     simvastatin  (ZOCOR ) 10 MG tablet TAKE ONE TABLET BY MOUTH ONCE A DAY AT SIX IN THE EVENING 90 tablet 3   tamsulosin  (FLOMAX ) 0.4 MG CAPS capsule TAKE ONE CAPSULE BY MOUTH ONCE DAILY AFTER SUPPER 90 capsule 3   valsartan  (DIOVAN ) 320 MG tablet Take 1 tablet (320 mg total) by mouth daily. 90 tablet 3   spironolactone  (ALDACTONE ) 25 MG tablet Take 1 tablet (25 mg total) by mouth daily. 90 tablet 3   traZODone  (DESYREL ) 50 MG tablet Take 1 tablet (50 mg total) by mouth at bedtime as needed for sleep.     diphenhydrAMINE  (BENADRYL  ALLERGY) 25 mg capsule Take 1 capsule (25 mg total) by mouth at bedtime as needed.     melatonin 5 MG TABS Take 1 tablet (5 mg total) by mouth at  bedtime as needed.     traZODone  (DESYREL ) 50 MG tablet Take 0.5-1 tablets (25-50 mg total) by mouth at bedtime as needed for sleep. 30 tablet 0   No facility-administered medications prior to visit.     Per HPI unless specifically indicated in ROS section below Review of Systems  Objective:  BP 120/64   Pulse 99   Temp 98.5 F (36.9 C) (Oral)   Ht 5\' 6"  (1.676 m)   Wt 171 lb 2 oz (77.6 kg)   SpO2 96%   BMI 27.62 kg/m   Wt Readings from Last 3 Encounters:  03/28/24 171 lb 2 oz (77.6 kg)  02/22/24 164 lb 3.2 oz (74.5 kg)  02/01/24 166 lb 4 oz (75.4 kg)      Physical Exam Vitals and nursing note reviewed.  Constitutional:      Appearance: Normal appearance. He is not ill-appearing.  HENT:     Head: Normocephalic and atraumatic.     Mouth/Throat:     Mouth: Mucous membranes are moist.     Pharynx: Oropharynx is clear. No oropharyngeal exudate or posterior oropharyngeal erythema.  Eyes:     Extraocular Movements: Extraocular movements intact.     Conjunctiva/sclera: Conjunctivae normal.     Pupils: Pupils are equal, round, and reactive to light.  Cardiovascular:     Rate and Rhythm: Normal rate and regular rhythm.     Pulses: Normal pulses.     Heart sounds: Normal heart sounds. No murmur heard. Pulmonary:     Effort: Pulmonary effort is normal. No respiratory distress.     Breath sounds: Normal breath sounds. No wheezing, rhonchi or rales.  Musculoskeletal:        General: No tenderness.     Right lower leg: No edema.     Left lower leg: No edema.  Skin:    General: Skin is warm and dry.     Findings: No rash.  Neurological:     Mental Status: He is alert.  Psychiatric:        Mood and Affect: Mood normal.        Behavior: Behavior normal.       Results for orders placed or performed in visit on 02/01/24  Vitamin B1   Collection Time: 02/01/24 11:17 AM  Result Value Ref Range   Vitamin B1 (Thiamine ) 12 8 - 30 nmol/L  Vitamin B12   Collection Time:  02/01/24 11:17 AM  Result Value Ref Range   Vitamin B-12 660 211 - 911 pg/mL  CBC with Differential/Platelet   Collection Time: 02/01/24 11:17 AM  Result Value Ref Range   WBC 7.2 4.0 -  10.5 K/uL   RBC 4.46 4.22 - 5.81 Mil/uL   Hemoglobin 14.0 13.0 - 17.0 g/dL   HCT 98.1 19.1 - 47.8 %   MCV 95.4 78.0 - 100.0 fl   MCHC 32.8 30.0 - 36.0 g/dL   RDW 29.5 62.1 - 30.8 %   Platelets 266.0 150.0 - 400.0 K/uL   Neutrophils Relative % 66.6 43.0 - 77.0 %   Lymphocytes Relative 25.0 12.0 - 46.0 %   Monocytes Relative 6.9 3.0 - 12.0 %   Eosinophils Relative 1.1 0.0 - 5.0 %   Basophils Relative 0.4 0.0 - 3.0 %   Neutro Abs 4.8 1.4 - 7.7 K/uL   Lymphs Abs 1.8 0.7 - 4.0 K/uL   Monocytes Absolute 0.5 0.1 - 1.0 K/uL   Eosinophils Absolute 0.1 0.0 - 0.7 K/uL   Basophils Absolute 0.0 0.0 - 0.1 K/uL  Uric acid   Collection Time: 02/01/24 11:17 AM  Result Value Ref Range   Uric Acid, Serum 4.8 4.0 - 7.8 mg/dL    Assessment & Plan:   Problem List Items Addressed This Visit     History of urinary retention   Recommend avoid anticholinergics for this reason.       DDD (degenerative disc disease), lumbar   Bilateral leg pain   Presumed stemming from known lumbar issues.  Not consistent with RLS.  Some cramping, declines further evaluation at this time. Would consider checking Mg, CPK (in statin use), iron panel.       Chronic back pain   Sees neurosurgeon and pain management, discussing spinal cord stimulator placement.  Continue tylenol  scheduled TID, planning to stay off stronger pain medication due to poor effect/side effects.      Relevant Medications   traZODone  (DESYREL ) 50 MG tablet   Chronic insomnia - Primary   Anticipate multifactorial - stress, anxiety, chronic pain, BPH.  Pt states issue present over the past year but acutely worsening over the last few months.  Reviewed sleep hygiene measures, checklist provided.  Reviewed treatments to date. He finds nyquil helps.   Trazodone  25mg  didn't help - recommend he try 50mg  dose.  He's taken wife's oxazepam with benefit - discussed not taking other ppl's medications. Reviewed risks of benzodiazepines including but not limited to decreased respiratory drive, increased fall risk, addition/abuse potential, negative effect on cognition and possible increase dementia risk, and interactions with other medications especially pain medications. Will Rx oxazepam 10mg , emphasizing to be used sparingly.  Update with effect.        Meds ordered this encounter  Medications   oxazepam (SERAX) 10 MG capsule    Sig: Take 1 capsule (10 mg total) by mouth at bedtime as needed for sleep or anxiety.    Dispense:  30 capsule    Refill:  0    No orders of the defined types were placed in this encounter.   Patient Instructions  May take oxazepam 10mg  at night as needed for sleep - prescription sent to pharmacy. Use sparingly.  Restart trazodone  50mg  nightly for sleep.  If ongoing leg symptoms, I recommend labwork.   Bedtime routine checklist: 1. Avoid naps during the day 2. Avoid stimulants such as caffeine and nicotine. Avoid bedtime alcohol (it can speed onset of sleep but the body's metabolism can cause awakenings). 3. All forms of exercise help ensure sound sleep - limit vigorous exercise to morning or late afternoon 4. Avoid food too close to bedtime including chocolate (which contains caffeine) 5. Soak up natural light  6. Establish regular bedtime routine. 7. Associate bed with sleep - avoid TV, computer or phone, reading while in bed. 8. Ensure pleasant, relaxing sleep environment - quiet, dark, cool room.   Follow up plan: Return if symptoms worsen or fail to improve.  Claire Crick, MD

## 2024-03-28 NOTE — Assessment & Plan Note (Addendum)
 Sees neurosurgeon and pain management, discussing spinal cord stimulator placement.  Continue tylenol  scheduled TID, planning to stay off stronger pain medication due to poor effect/side effects.

## 2024-03-28 NOTE — Telephone Encounter (Deleted)
 Copied from CRM [id number]. Topic: Clinical - Medication Prior Auth >> Mar 28, 2024  2:52 PM [place] E wrote: Reason for CRM: Patient's wife called in stating that patient's medication oxazepam (SERAX) 10 MG capsule will need prior authorization.

## 2024-03-28 NOTE — Patient Instructions (Addendum)
 May take oxazepam 10mg  at night as needed for sleep - prescription sent to pharmacy. Use sparingly.  Restart trazodone  50mg  nightly for sleep.  If ongoing leg symptoms, I recommend labwork.   Bedtime routine checklist: 1. Avoid naps during the day 2. Avoid stimulants such as caffeine and nicotine. Avoid bedtime alcohol (it can speed onset of sleep but the body's metabolism can cause awakenings). 3. All forms of exercise help ensure sound sleep - limit vigorous exercise to morning or late afternoon 4. Avoid food too close to bedtime including chocolate (which contains caffeine) 5. Soak up natural light 6. Establish regular bedtime routine. 7. Associate bed with sleep - avoid TV, computer or phone, reading while in bed. 8. Ensure pleasant, relaxing sleep environment - quiet, dark, cool room.

## 2024-03-28 NOTE — Assessment & Plan Note (Addendum)
 Anticipate multifactorial - stress, anxiety, chronic pain, BPH.  Pt states issue present over the past year but acutely worsening over the last few months.  Reviewed sleep hygiene measures, checklist provided.  Reviewed treatments to date. He finds nyquil helps.  Trazodone  25mg  didn't help - recommend he try 50mg  dose.  He's taken wife's oxazepam with benefit - discussed not taking other ppl's medications. Reviewed risks of benzodiazepines including but not limited to decreased respiratory drive, increased fall risk, addition/abuse potential, negative effect on cognition and possible increase dementia risk, and interactions with other medications especially pain medications. Will Rx oxazepam 10mg , emphasizing to be used sparingly.  Update with effect.

## 2024-03-28 NOTE — Assessment & Plan Note (Signed)
 Recommend avoid anticholinergics for this reason.

## 2024-03-28 NOTE — Telephone Encounter (Signed)
 Opened in error

## 2024-03-28 NOTE — Assessment & Plan Note (Signed)
 Presumed stemming from known lumbar issues.  Not consistent with RLS.  Some cramping, declines further evaluation at this time. Would consider checking Mg, CPK (in statin use), iron panel.

## 2024-03-31 ENCOUNTER — Telehealth: Payer: Self-pay | Admitting: Family Medicine

## 2024-03-31 NOTE — Telephone Encounter (Unsigned)
 Copied from CRM [id number]. Topic: Clinical - Medication Prior Auth >> Mar 28, 2024  2:52 PM [place] E wrote: Reason for CRM: Patient's wife called in stating that patient's medication oxazepam  (SERAX ) 10 MG capsule will need prior authorization. >> Mar 31, 2024  4:47 PM Magdalene School wrote: Patient's wife, Montel Antu, calling because she was told today 03/31/28 by Mabeline Savant that what they received is insufficient information and that they will be reaching out to the office for more information. She stated that she provided the office phone number for them to call us .

## 2024-04-01 ENCOUNTER — Telehealth: Payer: Self-pay | Admitting: Family Medicine

## 2024-04-01 NOTE — Telephone Encounter (Signed)
 Copied from CRM [id number]. Topic: Clinical - Medication Prior Auth >> Mar 28, 2024  2:52 PM [place] E wrote: Reason for CRM: Patient's wife called in stating that patient's medication oxazepam  (SERAX ) 10 MG capsule will need prior authorization. >> Mar 31, 2024  4:47 PM Magdalene School wrote: Patient's wife, Montel Antu, calling because she was told today 03/31/28 by Mabeline Savant that what they received is insufficient information and that they will be reaching out to the office for more information. She stated that she provided the office phone number for them to call us .

## 2024-04-01 NOTE — Telephone Encounter (Addendum)
 See 03/31/24 phn note.

## 2024-04-01 NOTE — Telephone Encounter (Signed)
 Spoke with pt notifying him a PA was submitted to his insurance company on 03/28/24 (see 03/28/24 phn note) and that we're waiting for a decision from them. Notified pt if more information is needed, our PA team will submit that also and as soon as we hear something we will let him know. Pt verbalizes understanding.

## 2024-04-02 NOTE — Telephone Encounter (Signed)
 PLEASE BE ADVISED Clinical questions have been answered and PA submitted.TO PLAN. PA currently Pending.

## 2024-04-02 NOTE — Telephone Encounter (Signed)
 Copied from CRM [id number]. Topic: Clinical - Medication Question >> Apr 02, 2024  2:24 PM Albertha Alosa wrote: Reason for CRM: [employer] called regarding [place] Authorization stated that the prescription  oxazepam  (SERAX ) 10 MG approved from  03/31/2024/-03/31/2025

## 2024-04-03 ENCOUNTER — Other Ambulatory Visit (HOSPITAL_COMMUNITY): Payer: Self-pay

## 2024-04-03 NOTE — Telephone Encounter (Signed)
 Noted

## 2024-04-03 NOTE — Telephone Encounter (Signed)
 PLEASE BE ADVISED Clinical questions have been answered and PA submitted.TO PLAN. PA currently WAITING SENT TO PLAN ON 04/02/2024 ON OUTCOME FROM BLUE CROSS [employer]

## 2024-04-03 NOTE — Telephone Encounter (Signed)
 Pharmacy Patient Advocate Encounter  Received notification from [facility] that Prior Authorization for Oxazepam  10MG  capsules  has been APPROVED from 04/02/24 to 03/31/25. Unable to obtain price due to refill too soon rejection, last fill date 04/02/24 next available fill date7/4/25

## 2024-04-28 ENCOUNTER — Other Ambulatory Visit: Payer: Self-pay | Admitting: Family Medicine

## 2024-04-29 NOTE — Telephone Encounter (Signed)
 ERx

## 2024-05-15 DIAGNOSIS — M5416 Radiculopathy, lumbar region: Secondary | ICD-10-CM | POA: Diagnosis not present

## 2024-05-15 DIAGNOSIS — M961 Postlaminectomy syndrome, not elsewhere classified: Secondary | ICD-10-CM | POA: Diagnosis not present

## 2024-05-23 ENCOUNTER — Other Ambulatory Visit: Payer: Self-pay | Admitting: Family Medicine

## 2024-05-23 NOTE — Telephone Encounter (Signed)
 Requesting: xazepam (SERAX ) 10 MG capsule  Contract: No UDS: None Last Visit: 03/28/2024 Next Visit: Visit date not found Last Refill: 04/29/24

## 2024-05-28 NOTE — Telephone Encounter (Signed)
 ERx

## 2024-06-04 ENCOUNTER — Encounter: Payer: Self-pay | Admitting: Dermatology

## 2024-06-04 ENCOUNTER — Ambulatory Visit: Admitting: Dermatology

## 2024-06-04 DIAGNOSIS — C4339 Malignant melanoma of other parts of face: Secondary | ICD-10-CM | POA: Diagnosis not present

## 2024-06-04 DIAGNOSIS — D492 Neoplasm of unspecified behavior of bone, soft tissue, and skin: Secondary | ICD-10-CM

## 2024-06-04 DIAGNOSIS — C439 Malignant melanoma of skin, unspecified: Secondary | ICD-10-CM

## 2024-06-04 DIAGNOSIS — L578 Other skin changes due to chronic exposure to nonionizing radiation: Secondary | ICD-10-CM | POA: Diagnosis not present

## 2024-06-04 DIAGNOSIS — W908XXA Exposure to other nonionizing radiation, initial encounter: Secondary | ICD-10-CM | POA: Diagnosis not present

## 2024-06-04 DIAGNOSIS — L570 Actinic keratosis: Secondary | ICD-10-CM | POA: Diagnosis not present

## 2024-06-04 DIAGNOSIS — Z85828 Personal history of other malignant neoplasm of skin: Secondary | ICD-10-CM

## 2024-06-04 DIAGNOSIS — D485 Neoplasm of uncertain behavior of skin: Secondary | ICD-10-CM | POA: Diagnosis not present

## 2024-06-04 DIAGNOSIS — L820 Inflamed seborrheic keratosis: Secondary | ICD-10-CM | POA: Diagnosis not present

## 2024-06-04 HISTORY — DX: Malignant melanoma of skin, unspecified: C43.9

## 2024-06-04 NOTE — Progress Notes (Unsigned)
 Follow-Up Visit   Subjective  Adrian Davis is a 83 y.o. male who presents for the following: Spots on face. Dur: 3 months. Looked like blood blister. Patient has picked at area. Will not heal for past 3 weeks.   Hx of BCC.   The patient has spots, moles and lesions to be evaluated, some may be new or changing and the patient may have concern these could be cancer.  Wife is with patient and contributes to history.  The following portions of the chart were reviewed this encounter and updated as appropriate: medications, allergies, medical history  Review of Systems:  No other skin or systemic complaints except as noted in HPI or Assessment and Plan.  Objective  Well appearing patient in no apparent distress; mood and affect are within normal limits.  A focused examination was performed of the following areas: Face   Relevant physical exam findings are noted in the Assessment and Plan.  Left Temple 0.7 cm red crusted papule  Face, scalp, ears >15 (16) Erythematous thin papules/macules with gritty scale.  B/L temples x2 (2) Erythematous keratotic or waxy stuck-on papule or plaque.  Assessment & Plan   NEOPLASM OF SKIN Left Temple Epidermal / dermal shaving  Lesion diameter (cm):  0.7 Informed consent: discussed and consent obtained   Timeout: patient name, date of birth, surgical site, and procedure verified   Procedure prep:  Patient was prepped and draped in usual sterile fashion Prep type:  Isopropyl alcohol Anesthesia: the lesion was anesthetized in a standard fashion   Anesthetic:  1% lidocaine  w/ epinephrine  1-100,000 buffered w/ 8.4% NaHCO3 Instrument used: flexible razor blade   Hemostasis achieved with: pressure, aluminum chloride and electrodesiccation   Outcome: patient tolerated procedure well   Post-procedure details: sterile dressing applied and wound care instructions given   Dressing type: bandage and petrolatum     Destruction of lesion Complexity:  extensive   Destruction method: electrodesiccation and curettage   Informed consent: discussed and consent obtained   Timeout:  patient name, date of birth, surgical site, and procedure verified Procedure prep:  Patient was prepped and draped in usual sterile fashion Prep type:  Isopropyl alcohol Anesthesia: the lesion was anesthetized in a standard fashion   Anesthetic:  1% lidocaine  w/ epinephrine  1-100,000 buffered w/ 8.4% NaHCO3 Curettage performed in three different directions: Yes   Electrodesiccation performed over the curetted area: Yes   Curettage cycles:  3 Lesion length (cm):  0.7 Lesion width (cm):  0.7 Margin per side (cm):  0.2 Final wound size (cm):  1.1 Hemostasis achieved with:  pressure and aluminum chloride Outcome: patient tolerated procedure well with no complications   Post-procedure details: sterile dressing applied and wound care instructions given   Dressing type: bandage and petrolatum     Specimen 1 - Surgical pathology Differential Diagnosis: R/O BCC vs other skin cancer   Check Margins: No  EDC today 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 They are chronic with expected duration over 1 year. A portion of actinic keratoses will progress to squamous cell carcinoma of the skin. It is not possible to reliably predict which spots will progress to skin cancer and so treatment is recommended to prevent development of skin cancer.  Recommend daily broad spectrum sunscreen SPF 30+ to sun-exposed areas, reapply every 2 hours as needed.  Recommend staying in the shade or wearing long sleeves, sun  glasses (UVA+UVB protection) and wide brim hats (4-inch brim around the entire circumference of the hat). Call for new or changing lesions. Destruction of lesion - Face, scalp, ears >15 (16) Complexity: simple   Destruction method:  cryotherapy   Informed consent: discussed and consent obtained   Timeout:  patient name, date of birth, surgical site, and procedure verified Lesion destroyed using liquid nitrogen: Yes   Region frozen until ice ball extended beyond lesion: Yes   Outcome: patient tolerated procedure well with no complications   Post-procedure details: wound care instructions given   Additional details:  Prior to procedure, discussed risks of blister formation, small wound, skin dyspigmentation, or rare scar following cryotherapy. Recommend Vaseline ointment to treated areas while healing.   INFLAMED SEBORRHEIC KERATOSIS (2) B/L temples x2 (2) Symptomatic, irritating, patient would like treated. Destruction of lesion - B/L temples x2 (2) Complexity: simple   Destruction method: cryotherapy   Informed consent: discussed and consent obtained   Timeout:  patient name, date of birth, surgical site, and procedure verified Lesion destroyed using liquid nitrogen: Yes   Region frozen until ice ball extended beyond lesion: Yes   Outcome: patient tolerated procedure well with no complications   Post-procedure details: wound care instructions given    Destruction of lesion - B/L temples x2 (2) ACTINIC SKIN DAMAGE   HISTORY OF BASAL CELL CARCINOMA    ACTINIC DAMAGE - chronic, secondary to cumulative UV radiation exposure/sun exposure over time - diffuse scaly erythematous macules with underlying dyspigmentation - Recommend daily broad spectrum sunscreen SPF 30+ to sun-exposed areas, reapply every 2 hours as needed.  - Recommend staying in the shade or wearing long sleeves, sun glasses (UVA+UVB protection) and wide brim hats (4-inch brim around the entire circumference of the hat). - Call for new or changing lesions.  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  - Call if any new or changing lesions are noted between office visits  Return in about 6 months (around 12/05/2024) for UBSE.  I, Jill Parcell, CMA, am acting as scribe for Alm Rhyme, MD.   Documentation: I have reviewed the above documentation for accuracy and completeness, and I agree with the above.  Alm Rhyme, MD

## 2024-06-04 NOTE — Patient Instructions (Signed)
 Cryotherapy Aftercare  Wash gently with soap and water  everyday.   Apply Vaseline Jelly daily until healed.     Wound Care Instructions  Cleanse wound gently with soap and water  once a day then pat dry with clean gauze. Apply a thin coat of Petrolatum  (petroleum jelly, Vaseline) over the wound (unless you have an allergy to this). We recommend that you use a new, sterile tube of Vaseline. Do not pick or remove scabs. Do not remove the yellow or white healing tissue from the base of the wound.  Cover the wound with fresh, clean, nonstick gauze and secure with paper tape. You may use Band-Aids in place of gauze and tape if the wound is small enough, but would recommend trimming much of the tape off as there is often too much. Sometimes Band-Aids can irritate the skin.  You should call the office for your biopsy report after 1 week if you have not already been contacted.  If you experience any problems, such as abnormal amounts of bleeding, swelling, significant bruising, significant pain, or evidence of infection, please call the office immediately.  FOR ADULT SURGERY PATIENTS: If you need something for pain relief you may take 1 extra strength Tylenol  (acetaminophen ) AND 2 Ibuprofen (200mg  each) together every 4 hours as needed for pain. (do not take these if you are allergic to them or if you have a reason you should not take them.) Typically, you may only need pain medication for 1 to 3 days.       Recommend daily broad spectrum sunscreen SPF 30+ to sun-exposed areas, reapply every 2 hours as needed. Call for new or changing lesions.  Staying in the shade or wearing long sleeves, sun glasses (UVA+UVB protection) and wide brim hats (4-inch brim around the entire circumference of the hat) are also recommended for sun protection.      Due to recent changes in healthcare laws, you may see results of your pathology and/or laboratory studies on MyChart before the doctors have had a chance  to review them. We understand that in some cases there may be results that are confusing or concerning to you. Please understand that not all results are received at the same time and often the doctors may need to interpret multiple results in order to provide you with the best plan of care or course of treatment. Therefore, we ask that you please give us  2 business days to thoroughly review all your results before contacting the office for clarification. Should we see a critical lab result, you will be contacted sooner.   If You Need Anything After Your Visit  If you have any questions or concerns for your doctor, please call our main line at [phone] and press option 4 to reach your doctor's medical assistant. If no one answers, please leave a voicemail as directed and we will return your call as soon as possible. Messages left after 4 pm will be answered the following business day.   You may also send us  a message via MyChart. We typically respond to MyChart messages within 1-2 business days.  For prescription refills, please ask your pharmacy to contact our office. Our fax number is [phone].  If you have an urgent issue when the clinic is closed that cannot wait until the next business day, you can page your doctor at the number below.    Please note that while we do our best to be available for urgent issues outside of office hours, we are not  available 24/7.   If you have an urgent issue and are unable to reach us , you may choose to seek medical care at your doctor's office, retail clinic, urgent care center, or emergency room.  If you have a medical emergency, please immediately call 911 or go to the emergency department.  Pager Numbers  - Dr. Hester: [phone]  - Dr. Jackquline: [phone]  - Dr. Claudene: [phone]   In the event of inclement weather, please call our main line at [phone] for an update on the status of any delays or closures.  Dermatology  Medication Tips: Please keep the boxes that topical medications come in in order to help keep track of the instructions about where and how to use these. Pharmacies typically print the medication instructions only on the boxes and not directly on the medication tubes.   If your medication is too expensive, please contact our office at [phone] option 4 or send us  a message through MyChart.   We are unable to tell what your co-pay for medications will be in advance as this is different depending on your insurance coverage. However, we may be able to find a substitute medication at lower cost or fill out paperwork to get insurance to cover a needed medication.   If a prior authorization is required to get your medication covered by your insurance company, please allow us  1-2 business days to complete this process.  Drug prices often vary depending on where the prescription is filled and some pharmacies may offer cheaper prices.  The website www.goodrx.com contains coupons for medications through different pharmacies. The prices here do not account for what the cost may be with help from insurance (it may be cheaper with your insurance), but the website can give you the price if you did not use any insurance.  - You can print the associated coupon and take it with your prescription to the pharmacy.  - You may also stop by our office during regular business hours and pick up a GoodRx coupon card.  - If you need your prescription sent electronically to a different pharmacy, notify our office through [facility] or by phone at [phone] option 4.     Si Usted Necesita Algo Despus de Su Visita  Tambin puede enviarnos un mensaje a travs de Clinical cytogeneticist. Por lo general respondemos a los mensajes de MyChart en el transcurso de 1 a 2 das hbiles.  Para renovar recetas, por favor pida a su farmacia que se ponga en contacto con nuestra oficina. Randi lakes de fax es [place] [phone].  Si  tiene un asunto urgente cuando la clnica est cerrada y que no puede esperar hasta el siguiente da hbil, puede llamar/localizar a su doctor(a) al nmero que aparece a continuacin.   Por favor, tenga en cuenta que aunque hacemos todo lo posible para estar disponibles para asuntos urgentes fuera del horario de [place], no estamos disponibles las 24 horas del da, los 7 [address]  [address] de la [place].   Si tiene un problema urgente y no puede comunicarse con nosotros, puede optar por buscar atencin mdica  en el consultorio de su doctor(a), en una clnica privada, en un centro de atencin urgente o en una sala de emergencias.  Si tiene Engineer, drilling, por favor llame inmediatamente al 911 o vaya a la sala de emergencias.  Nmeros de bper  - Dr. Hester: [phone]  - Dra. Jackquline: [phone]  - Dr. Claudene: [phone]   En caso de inclemencias del [place], por  favor llame a nuestra lnea principal al [phone] para una actualizacin sobre el [place] de cualquier retraso o cierre.  Consejos para la medicacin en dermatologa: Por favor, guarde las cajas en las que vienen los medicamentos de uso tpico para ayudarle a seguir las instrucciones sobre dnde y cmo usarlos. Las farmacias generalmente imprimen las instrucciones del medicamento slo en las cajas y no directamente en los tubos del [place].   Si su medicamento es muy caro, por favor, pngase en contacto con landry rieger llamando al [phone] y presione la opcin 4 o envenos un mensaje a travs de Clinical cytogeneticist.   No podemos decirle cul ser su copago por los medicamentos por adelantado ya que esto es diferente dependiendo de la cobertura de su seguro. Sin embargo, es posible que podamos encontrar un medicamento sustituto a Audiological scientist un formulario para que el seguro cubra el medicamento que se considera necesario.   Si se requiere una autorizacin previa para que su compaa de seguros malta su medicamento, por  favor permtanos de 1 a 2 das hbiles para completar este proceso.  Los precios de los medicamentos varan con frecuencia dependiendo del Environmental consultant de dnde se surte la receta y alguna farmacias pueden ofrecer precios ms baratos.  El sitio web www.goodrx.com tiene cupones para medicamentos de Health and safety inspector. Los precios aqu no tienen en cuenta lo que podra costar con la ayuda del seguro (puede ser ms barato con su seguro), pero el sitio web puede darle el precio si no utiliz Tourist information centre manager.  - Puede imprimir el cupn correspondiente y llevarlo con su receta a la farmacia.  - Tambin puede pasar por nuestra oficina durante el horario de atencin regular y Education officer, museum una tarjeta de cupones de GoodRx.  - Si necesita que su receta se enve electrnicamente a una farmacia diferente, informe a nuestra oficina a travs de MyChart de [place] o por telfono llamando al [phone] y presione la opcin 4.

## 2024-06-09 ENCOUNTER — Ambulatory Visit: Payer: Self-pay | Admitting: Dermatology

## 2024-06-09 DIAGNOSIS — C439 Malignant melanoma of skin, unspecified: Secondary | ICD-10-CM

## 2024-06-09 LAB — SURGICAL PATHOLOGY

## 2024-06-10 NOTE — Telephone Encounter (Addendum)
 Referral sent to    Dr Vicenta Gee at [facility] at (phone [phone]) [address] [place], [place] [address]   ----- Message from Alm Rhyme sent at 06/09/2024  6:05 PM EDT ----- FINAL DIAGNOSIS       1. Skin, left temple :      MALIGNANT MELANOMA      MELANOMA TABLE (AJCC 8TH EDITION##      SPECIMEN ANATOMIC SITE:   LEFT TEMPLE      HISTOLOGIC TYPE:   NODULAR (FOCAL SUPERFICIALSPREADING COMPONENT, AMELANOTIC)      BRESLOW'S DEPTH/MAXIMUM TUMOR THICKNESS:  AT LEAST 1.3 MM      CLARK/ANATOMIC LEVEL:   III      MARGINS      PERIPHERAL MARGINS:   INVOLVED      DEEP MARGIN:   INVOLVED      ULCERATION:  PRESENT      SATELLITOSIS:   ABSENT      MITOTIC INDEX:   7 PER MM/2      LYMPHO-VASCULAR INVASION:   ABSENT      NEUROTROPISM:  ABSENT      TUMOR-INFILTRATING LYMPHOCYTES:   BRISK      TUMOR REGRESSION:   ABSENT      LYMPH NODES (IF APPLICABLE):   N/A      PATHOLOGIC STAGE: PT2B      COMMENT:  A COMPLETE RE-EXCISION IS RECOMMENDED.      # AJCC 8TH EDITION:  PT1A <0.8 W/O ULCER; PT1B <0.8 W/ ULCER OR 0.8-1.0 WITH OR      W/O ULCER; PT2A >1.0-2.0 W/OUT ULCER; PT2B >1.0-2.0 W/ ULCER; PT3A >2.0-4.0 W/O      ULCER; PT3B >2.0-4.0 W/ ULCER; PT4A >4.0 W/O ULCER; PT4B: >4.0 W/ ULCER. 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 Discussed with pt by phone today that although area treated with EDC at time of biopsy, he needs further treatment.  Recommend refer to Dr Vicenta Gee at [facility] at [place], [place] (phone [phone]) Pt agrees.  Please send referral. Reviewed Melanoma and treatment recommendations w pt today. Schedule as above and then with me at [facility] in 3 months.  ----- Message ----- From: Interface, Lab In Three Zero  One Sent: 06/09/2024   5:00 PM EDT To: Alm JAYSON Rhyme, MD

## 2024-06-13 DIAGNOSIS — C4330 Malignant melanoma of unspecified part of face: Secondary | ICD-10-CM | POA: Diagnosis not present

## 2024-06-16 ENCOUNTER — Encounter: Payer: Self-pay | Admitting: Dermatology

## 2024-06-16 NOTE — Telephone Encounter (Addendum)
 Patient had first initial visit with Dr. Norman on 06/13/2024 and is scheduled for treatment on 06/21/2024 at 3:30 pm    ----- Message from Alm Rhyme sent at 06/09/2024  6:05 PM EDT ----- FINAL DIAGNOSIS       1. Skin, left temple :      MALIGNANT MELANOMA      MELANOMA TABLE (AJCC 8TH EDITION##      SPECIMEN ANATOMIC SITE:   LEFT TEMPLE      HISTOLOGIC TYPE:   NODULAR (FOCAL SUPERFICIALSPREADING COMPONENT, AMELANOTIC)      BRESLOW'S DEPTH/MAXIMUM TUMOR THICKNESS:  AT LEAST 1.3 MM      CLARK/ANATOMIC LEVEL:   III      MARGINS      PERIPHERAL MARGINS:   INVOLVED      DEEP MARGIN:   INVOLVED      ULCERATION:  PRESENT      SATELLITOSIS:   ABSENT      MITOTIC INDEX:   7 PER MM/2      LYMPHO-VASCULAR INVASION:   ABSENT      NEUROTROPISM:  ABSENT      TUMOR-INFILTRATING LYMPHOCYTES:   BRISK      TUMOR REGRESSION:   ABSENT      LYMPH NODES (IF APPLICABLE):   N/A      PATHOLOGIC STAGE: PT2B      COMMENT:  A COMPLETE RE-EXCISION IS RECOMMENDED.      # AJCC 8TH EDITION:  PT1A <0.8 W/O ULCER; PT1B <0.8 W/ ULCER OR 0.8-1.0 WITH OR      W/O ULCER; PT2A >1.0-2.0 W/OUT ULCER; PT2B >1.0-2.0 W/ ULCER; PT3A >2.0-4.0 W/O      ULCER; PT3B >2.0-4.0 W/ ULCER; PT4A >4.0 W/O ULCER; PT4B: >4.0 W/ ULCER. 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 Discussed with pt by phone today that although area treated with EDC at time of biopsy, he needs further treatment.  Recommend refer to Dr Vicenta Norman at [facility] at [place], [place] (phone [phone]) Pt agrees.  Please send referral. Reviewed Melanoma and treatment recommendations w pt today. Schedule as above and then with me at [facility] in 3 months.  ----- Message ----- From: Interface, Lab In Three Zero One Sent: 06/09/2024   5:00  PM EDT To: Alm JAYSON Rhyme, MD

## 2024-06-18 DIAGNOSIS — D485 Neoplasm of uncertain behavior of skin: Secondary | ICD-10-CM | POA: Diagnosis not present

## 2024-06-18 DIAGNOSIS — C4339 Malignant melanoma of other parts of face: Secondary | ICD-10-CM | POA: Diagnosis not present

## 2024-06-19 DIAGNOSIS — M5416 Radiculopathy, lumbar region: Secondary | ICD-10-CM | POA: Diagnosis not present

## 2024-06-21 DIAGNOSIS — C4330 Malignant melanoma of unspecified part of face: Secondary | ICD-10-CM | POA: Diagnosis not present

## 2024-06-21 DIAGNOSIS — I7143 Infrarenal abdominal aortic aneurysm, without rupture: Secondary | ICD-10-CM | POA: Diagnosis not present

## 2024-06-26 ENCOUNTER — Telehealth: Payer: Self-pay

## 2024-06-26 ENCOUNTER — Other Ambulatory Visit: Payer: Self-pay | Admitting: Family Medicine

## 2024-06-26 DIAGNOSIS — F5104 Psychophysiologic insomnia: Secondary | ICD-10-CM

## 2024-06-26 NOTE — Telephone Encounter (Signed)
   Pre-operative Risk Assessment    Patient Name: Adrian Davis  DOB: [birth year] MRN: [id number]   Date of last office visit: 01/11/24 [name], MD Date of next office visit: 07/08/24 LUM LOUIS, NP   Request for Surgical Clearance    Procedure:  Malignant melanoma of face excluding eyelid, nose, lip, and ear  Date of Surgery:  Clearance 07/10/24                                Surgeon:  VICENTA GLENDIA GEE, MD Surgeon's Group or Practice Name:  [facility] Phone number:  [phone] Fax number:  [phone]   Type of Clearance Requested:   - Medical  - Pharmacy:  Hold Aspirin     Type of Anesthesia:  General    Additional requests/questions:    SignedLucie DELENA Ku   06/26/2024, 3:50 PM

## 2024-06-26 NOTE — Telephone Encounter (Signed)
   Name: [name]  DOB: Apr 21, 1941  MRN: [id number]  Primary Cardiologist: [name], MD  Chart reviewed as part of pre-operative protocol coverage. Because of Adrian Davis past medical history and time since last visit, he will require a follow-up in-office visit in order to better assess preoperative cardiovascular risk.  Patient has an office visit scheduled on 07/08/2024 with Lum Louis, NP. Appointment notes have been updated to reflect need for pre-op evaluation.   Pre-op covering staff:  - Please contact requesting surgeon's office via preferred method (i.e, phone, fax) to inform them of need for appointment prior to surgery.   Patient will need to stop aspirin 5-7 days prior to procedure.    Barnie Hila, NP  06/26/2024, 5:12 PM

## 2024-06-26 NOTE — Telephone Encounter (Signed)
 ERx

## 2024-06-26 NOTE — Telephone Encounter (Signed)
 Spoke to patient's wife she stated husband has melanoma skin cancer on side of face that requires surgery.He is scheduled to have surgery at [facility] 9/18.Stated he has appointment with Dr.Jordan that day.Appointment with Dr.Jordan cancelled.Appointment scheduled with Lum Louis NP 9/16 at 2:45 pm.Advised to have surgeon fax a surgical clearance.She wanted to know if he will need lab before appointment.I will send message to Dr.Jordan for advice

## 2024-06-26 NOTE — Telephone Encounter (Signed)
 Name of Medication:  Oxazepam   Name of Pharmacy:  [facility] Last Fill or Written Date and Quantity:  05/28/24, #30 Last Office Visit and Type:  03/28/24, chronic insomnia f/u Next Office Visit and Type:  none Last Controlled Substance Agreement Date:  none Last UDS:  none

## 2024-06-27 DIAGNOSIS — C4330 Malignant melanoma of unspecified part of face: Secondary | ICD-10-CM | POA: Diagnosis not present

## 2024-06-27 NOTE — Telephone Encounter (Signed)
Called patient left Dr.Jordan's advice on personal voice mail.

## 2024-06-27 NOTE — Telephone Encounter (Signed)
 I s/w the pt and his wife and explained the pt is going to need a sooner appt as he needs to hold ASA in time for procedure 07/10/24. Pt agrees to appt at [facility] location 06/30/24 with Rosaline Bane, NP @ 3:10. Both pt and his wife agreed to the sooner appt. Both have been given address for [facility] location.   I will update all parties involved.

## 2024-06-30 ENCOUNTER — Encounter (HOSPITAL_BASED_OUTPATIENT_CLINIC_OR_DEPARTMENT_OTHER): Payer: Self-pay | Admitting: Nurse Practitioner

## 2024-06-30 ENCOUNTER — Ambulatory Visit (HOSPITAL_BASED_OUTPATIENT_CLINIC_OR_DEPARTMENT_OTHER): Admitting: Nurse Practitioner

## 2024-06-30 VITALS — BP 140/78 | HR 81 | Ht 68.5 in | Wt 173.0 lb

## 2024-06-30 DIAGNOSIS — E785 Hyperlipidemia, unspecified: Secondary | ICD-10-CM | POA: Diagnosis not present

## 2024-06-30 DIAGNOSIS — I2510 Atherosclerotic heart disease of native coronary artery without angina pectoris: Secondary | ICD-10-CM | POA: Diagnosis not present

## 2024-06-30 DIAGNOSIS — Z01818 Encounter for other preprocedural examination: Secondary | ICD-10-CM | POA: Diagnosis not present

## 2024-06-30 DIAGNOSIS — I10 Essential (primary) hypertension: Secondary | ICD-10-CM | POA: Diagnosis not present

## 2024-06-30 NOTE — Patient Instructions (Signed)
 Medication Instructions:   Your physician recommends that you continue on your current medications as directed. Please refer to the Current Medication list given to you today.   *If you need a refill on your cardiac medications before your next appointment, please call your pharmacy*  Lab Work:  None ordered.  If you have labs (blood work) drawn today and your tests are completely normal, you will receive your results only by: MyChart Message (if you have MyChart) OR A paper copy in the mail If you have any lab test that is abnormal or we need to change your treatment, we will call you to review the results.  Testing/Procedures:  None ordered.  Follow-Up: At [facility], you and your health needs are our priority.  As part of our continuing mission to provide you with exceptional heart care, our providers are all part of one team.  This team includes your primary Cardiologist (physician) and Advanced Practice Providers or APPs (Physician Assistants and Nurse Practitioners) who all work together to provide you with the care you need, when you need it.  Your next appointment:   6 month(s)  Provider:   [name], MD    We recommend signing up for the patient portal called MyChart.  Sign up information is provided on this After Visit Summary.  MyChart is used to connect with patients for Virtual Visits (Telemedicine).  Patients are able to view lab/test results, encounter notes, upcoming appointments, etc.  Non-urgent messages can be sent to your provider as well.   To learn more about what you can do with MyChart, go to [email or web address].   Other Instructions  Your physician wants you to follow-up in: 6 month.  You will receive a reminder letter in the mail two months in advance. If you don't receive a letter, please call our office to schedule the follow-up appointment.  HOW TO TAKE YOUR BLOOD PRESSURE  Rest 5 minutes before taking your blood pressure. Don't   smoke or drink caffeinated beverages for at least 30 minutes before. Take your blood pressure before (not after) you eat. Sit comfortably with your back supported and both feet on the floor ( don't cross your legs). Elevate your arm to heart level on a table or a desk. Use the proper sized cuff.  It should fit smoothly and snugly around your bare upper arm.  There should be  Enough room to slip a fingertip under the cuff.  The bottom edge of the cuff should be 1 inch above the crease Of the elbow. Please monitor your blood pressure once daily 2 hours after your am medication.  your blood pressure goal Is  to remain below 140 (systolic) top number or over 80 ( diastolic) bottom number   ----Avoid cold medicines with D or DM at the end of them----

## 2024-06-30 NOTE — Progress Notes (Signed)
 Cardiology Office Note   Date:  06/30/2024  ID:  Adrian Davis, DOB 01-29-1941, MRN [id number] PCP: Rilla Baller, MD  [facility] Providers Cardiologist:  [name], MD     [facility] Malignant melanoma Hypertension Hyperlipidemia Mild LVH Coronary artery calcification on CT 07/2023  Normal adenosine  Myoview  2008.  Echo January 2012 with grade 1 diastolic dysfunction, mild LVH, and normal EF.  He underwent THR on 11/10/2020 by Dr. Hiram. 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  Surgery was recommended by Dr. Mavis but patient did not want to proceed.  He underwent left TKR on 11/21/2021.  Last cardiology clinic visit was 07/04/2023 with Dr. Swaziland.  He was not having any concerning cardiac symptoms.  BP was well-controlled on amlodipine  and Lotensin .   History of Present Illness Discussed the use of AI scribe software for clinical note transcription with the patient, who gave verbal consent to proceed.  History of Present Illness Adrian Davis is a very pleasant 83 year old male who presents for cardiovascular evaluation.  He is accompanied by his wife.  Unfortunately, he was found to have melanoma on his face and requires surgical excision.  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  He had a recent back injection which has helped his pain somewhat, but unfortunately he continues to have nerve pain that radiates down his leg.  He is able to achieve > 4 METS activity without concerning cardiac symptoms.  He denies chest pain, shortness of breath, palpitations, orthopnea, PND, presyncope, syncope.  Admits home BP typically runs in the mid 140s over upper 70s.   ROS: See HPI  Studies Reviewed EKG Interpretation Date/Time:  Monday June 30 2024 15:03:48 EDT Ventricular Rate:  81 PR Interval:  152 QRS Duration:  96 QT Interval:  374 QTC Calculation: 434 R Axis:   -29  Text  Interpretation: Normal sinus rhythm Moderate voltage criteria for LVH, may be normal variant ( R in aVL , Cornell product ) When compared with ECG of 20-Aug-2023 10:44, No significant change since last tracing Confirmed by Adrian Davis [phone]) on 06/30/2024 3:21:56 PM     No results found for: LIPOA  Risk Assessment/Calculations   HYPERTENSION CONTROL Vitals:   06/30/24 1500 06/30/24 1537  BP: 138/76 (!) 140/78    The patient's blood pressure is elevated above target today.  In order to address the patient's elevated BP: Blood pressure will be monitored at home to determine if medication changes need to be made.          Physical Exam VS:  BP (!) 140/78   Pulse 81   Ht 5' 8.5 (1.74 m)   Wt 173 lb (78.5 kg)   SpO2 96%   BMI 25.92 kg/m    Wt Readings from Last 3 Encounters:  06/30/24 173 lb (78.5 kg)  03/28/24 171 lb 2 oz (77.6 kg)  02/22/24 164 lb 3.2 oz (74.5 kg)    GEN: Well nourished, well developed in no acute distress NECK: No JVD; No carotid bruits CARDIAC: RRR, no murmurs, rubs, gallops RESPIRATORY:  Clear to auscultation without rales, wheezing or rhonchi  ABDOMEN: Soft, non-tender, non-distended EXTREMITIES:  No edema; No deformity    Assessment & Plan Preoperative cardiovascular evaluation According to the Revised Cardiac Risk Index (RCRI), his Perioperative Risk of Major Cardiac Event is (%): 0.4. His Functional Capacity in METs is: 5.07 according to the  [employer] Activity Status Index (DASI). The patient is doing well from a cardiac perspective. Therefore, based on ACC/AHA guidelines, the patient would be at acceptable risk for the planned procedure without further cardiovascular testing. Per office protocol, he may hold aspirin for 7 days prior to procedure and should resume as soon as hemodynamically stable postoperatively.  I will forward clearance to requesting provider.  Essential hypertension   Blood pressure is elevated in clinic today and remains  elevated on my recheck. He reports home BP typically in the 140s/70s range. He does not want to add additional medication.  -Recommend close home  monitoring of BP for goal < 140/80 -Consider additional antihypertensive therapy if BP remains above goal following surgery  Coronary artery calcification Hyperlipidemia LDL goal < 70 Coronary calcification noted on CT 07/2023. He is active although somewhat limited by back pain. He denies chest pain, dyspnea, or other symptoms concerning for angina.  No indication for further ischemic evaluation at this time.  EKG with no ST/T abnormality.  Lipid panel completed 01/07/2024 with total cholesterol 147, HDL 46, LDL-C 78, and triglycerides 132.  He does not want to add additional medication. - Continue simvastatin         Dispo: 6 months with Dr. Swaziland    Signed, Rosaline Bane, NP-C

## 2024-07-08 ENCOUNTER — Ambulatory Visit: Admitting: Emergency Medicine

## 2024-07-10 ENCOUNTER — Ambulatory Visit: Admitting: Cardiology

## 2024-07-10 DIAGNOSIS — Z7982 Long term (current) use of aspirin: Secondary | ICD-10-CM | POA: Diagnosis not present

## 2024-07-10 DIAGNOSIS — Z96652 Presence of left artificial knee joint: Secondary | ICD-10-CM | POA: Diagnosis not present

## 2024-07-10 DIAGNOSIS — Z96612 Presence of left artificial shoulder joint: Secondary | ICD-10-CM | POA: Diagnosis not present

## 2024-07-10 DIAGNOSIS — I10 Essential (primary) hypertension: Secondary | ICD-10-CM | POA: Diagnosis not present

## 2024-07-10 DIAGNOSIS — Z79891 Long term (current) use of opiate analgesic: Secondary | ICD-10-CM | POA: Diagnosis not present

## 2024-07-10 DIAGNOSIS — G8929 Other chronic pain: Secondary | ICD-10-CM | POA: Diagnosis not present

## 2024-07-10 DIAGNOSIS — M5416 Radiculopathy, lumbar region: Secondary | ICD-10-CM | POA: Diagnosis not present

## 2024-07-10 DIAGNOSIS — E785 Hyperlipidemia, unspecified: Secondary | ICD-10-CM | POA: Diagnosis not present

## 2024-07-10 DIAGNOSIS — C4330 Malignant melanoma of unspecified part of face: Secondary | ICD-10-CM | POA: Diagnosis not present

## 2024-07-10 DIAGNOSIS — Z96641 Presence of right artificial hip joint: Secondary | ICD-10-CM | POA: Diagnosis not present

## 2024-07-10 DIAGNOSIS — C4339 Malignant melanoma of other parts of face: Secondary | ICD-10-CM | POA: Diagnosis not present

## 2024-07-10 DIAGNOSIS — Z79899 Other long term (current) drug therapy: Secondary | ICD-10-CM | POA: Diagnosis not present

## 2024-07-10 DIAGNOSIS — Z96611 Presence of right artificial shoulder joint: Secondary | ICD-10-CM | POA: Diagnosis not present

## 2024-07-14 ENCOUNTER — Encounter: Payer: Self-pay | Admitting: Family Medicine

## 2024-07-23 ENCOUNTER — Other Ambulatory Visit: Payer: Self-pay | Admitting: Family Medicine

## 2024-07-23 DIAGNOSIS — F5104 Psychophysiologic insomnia: Secondary | ICD-10-CM

## 2024-07-23 NOTE — Telephone Encounter (Signed)
 Name of Medication:  Oxazepam   Name of Pharmacy:  [facility] Last Fill or Written Date and Quantity:  06/26/24, #30 Last Office Visit and Type:  03/28/24, chronic insomnia f/u Next Office Visit and Type:  none Last Controlled Substance Agreement Date:  none Last UDS:  none

## 2024-07-24 NOTE — Telephone Encounter (Signed)
 ERx

## 2024-07-25 ENCOUNTER — Telehealth: Payer: Self-pay | Admitting: Family Medicine

## 2024-07-25 NOTE — Telephone Encounter (Signed)
 Copied from CRM [id number]. Topic: Clinical - Medication Question >> Jul 25, 2024 11:05 AM Viola FALCON wrote: Reason for CRM: Josh from [facility] is trying to bill the oxazepam  (SERAX ) 10 MG capsule to patient insurance but Dr. Serina DEA is inactive. Please call him at [phone]

## 2024-08-25 ENCOUNTER — Encounter: Payer: Self-pay | Admitting: Radiology

## 2024-08-25 ENCOUNTER — Other Ambulatory Visit: Payer: Self-pay | Admitting: Cardiology

## 2024-09-03 ENCOUNTER — Ambulatory Visit (INDEPENDENT_AMBULATORY_CARE_PROVIDER_SITE_OTHER): Admitting: Podiatry

## 2024-09-03 DIAGNOSIS — M7750 Other enthesopathy of unspecified foot: Secondary | ICD-10-CM

## 2024-09-03 DIAGNOSIS — G5791 Unspecified mononeuropathy of right lower limb: Secondary | ICD-10-CM

## 2024-09-03 DIAGNOSIS — M7751 Other enthesopathy of right foot: Secondary | ICD-10-CM

## 2024-09-03 MED ORDER — TRIAMCINOLONE ACETONIDE 40 MG/ML IJ SUSP
20.0000 mg | Freq: Once | INTRAMUSCULAR | Status: AC
  Administered 2024-09-03: 20 mg

## 2024-09-03 NOTE — Progress Notes (Signed)
 He presents today states that that injection we put in that right foot really worked well.  He states that it hurts right and here as he points around the first metatarsal phalangeal joint and radiates up to my medial leg.  Objective: Vital signs are stable he is alert and oriented x 3 last time he was in his medial plantar nerve and mid diaphyseal first metatarsal area that was painful.  I palpated this area again today noting it painful with radiating pain of the medial leg.  There appears to be some neuromas type qualities to the medial plantar nerve from palpated that there is a small nodularity that is present.  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  Plan: I injected the midfoot area today near the base of the first metatarsal medial plantar nerve.  Hopefully this will help alleviate his symptoms around the first metatarsophalangeal joint and radiating pain to the leg.  This was injected with Kenalog  and local anesthetic.

## 2024-09-10 ENCOUNTER — Ambulatory Visit (INDEPENDENT_AMBULATORY_CARE_PROVIDER_SITE_OTHER): Admitting: Dermatology

## 2024-09-10 DIAGNOSIS — W908XXA Exposure to other nonionizing radiation, initial encounter: Secondary | ICD-10-CM | POA: Diagnosis not present

## 2024-09-10 DIAGNOSIS — Z85820 Personal history of malignant melanoma of skin: Secondary | ICD-10-CM

## 2024-09-10 DIAGNOSIS — Z1283 Encounter for screening for malignant neoplasm of skin: Secondary | ICD-10-CM | POA: Diagnosis not present

## 2024-09-10 DIAGNOSIS — L820 Inflamed seborrheic keratosis: Secondary | ICD-10-CM | POA: Diagnosis not present

## 2024-09-10 DIAGNOSIS — L219 Seborrheic dermatitis, unspecified: Secondary | ICD-10-CM

## 2024-09-10 DIAGNOSIS — B353 Tinea pedis: Secondary | ICD-10-CM

## 2024-09-10 DIAGNOSIS — D229 Melanocytic nevi, unspecified: Secondary | ICD-10-CM

## 2024-09-10 DIAGNOSIS — L578 Other skin changes due to chronic exposure to nonionizing radiation: Secondary | ICD-10-CM

## 2024-09-10 DIAGNOSIS — L814 Other melanin hyperpigmentation: Secondary | ICD-10-CM | POA: Diagnosis not present

## 2024-09-10 DIAGNOSIS — L821 Other seborrheic keratosis: Secondary | ICD-10-CM | POA: Diagnosis not present

## 2024-09-10 DIAGNOSIS — D1801 Hemangioma of skin and subcutaneous tissue: Secondary | ICD-10-CM | POA: Diagnosis not present

## 2024-09-10 DIAGNOSIS — Z7189 Other specified counseling: Secondary | ICD-10-CM

## 2024-09-10 DIAGNOSIS — R238 Other skin changes: Secondary | ICD-10-CM | POA: Diagnosis not present

## 2024-09-10 DIAGNOSIS — Z79899 Other long term (current) drug therapy: Secondary | ICD-10-CM

## 2024-09-10 DIAGNOSIS — Z85828 Personal history of other malignant neoplasm of skin: Secondary | ICD-10-CM | POA: Diagnosis not present

## 2024-09-10 MED ORDER — KETOCONAZOLE 2 % EX CREA: TOPICAL_CREAM | CUTANEOUS | 11 refills | Status: AC

## 2024-09-10 NOTE — Progress Notes (Signed)
 Follow-Up Visit   Subjective  Adrian Davis is a 83 y.o. male who presents for the following: Skin Cancer Screening and Full Body Skin Exam Hx of invasive malignant melanoma  Hx of bcc Hx of aks  And 3 month follow up on invasive malignant melanoma at left temple   The patient presents for Total-Body Skin Exam (TBSE) for skin cancer screening and mole check. The patient has spots, moles and lesions to be evaluated, some may be new or changing and the patient may have concern these could be cancer.  The following portions of the chart were reviewed this encounter and updated as appropriate: medications, allergies, medical history  Review of Systems:  No other skin or systemic complaints except as noted in HPI or Assessment and Plan.  Objective  Well appearing patient in no apparent distress; mood and affect are within normal limits.  A full examination was performed including scalp, head, eyes, ears, nose, lips, neck, chest, axillae, abdomen, back, buttocks, bilateral upper extremities, bilateral lower extremities, hands, feet, fingers, toes, fingernails, and toenails. All findings within normal limits unless otherwise noted below.   Relevant physical exam findings are noted in the Assessment and Plan.  Pink patch at left scapula   Pink patch at left scapula    above left medial ankle x 1, left forearm x 1, right upper back paraspinal x 1 (3) Erythematous stuck-on, waxy papule or plaque  Assessment & Plan   HISTORY OF MELANOMA 06/04/2024 left temple - invasive nodular type Breslow at least 1.3 mm Mitoses 7 per squared mm surgery done by Dr. Norman 06/21/2024 Donor Graph area at  left clavicle area  - scar area  - No evidence of recurrence today - No lymphadenopathy - Recommend regular full body skin exams - Recommend daily broad spectrum sunscreen SPF 30+ to sun-exposed areas, reapply every 2 hours as needed.  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  - Call if any new or changing lesions are noted between office visits   SKIN CANCER SCREENING PERFORMED TODAY.  ACTINIC DAMAGE - Chronic condition, secondary to cumulative UV/sun exposure - diffuse scaly erythematous macules with underlying dyspigmentation - Recommend daily broad spectrum sunscreen SPF 30+ to sun-exposed areas, reapply every 2 hours as needed.  - Staying in the shade or wearing long sleeves, sun glasses (UVA+UVB protection) and wide brim hats (4-inch brim around the entire circumference of the hat) are also recommended for sun protection.  - Call for new or changing lesions.  LENTIGINES, SEBORRHEIC KERATOSES, HEMANGIOMAS - Benign normal skin lesions - Benign-appearing - Call for any changes  MELANOCYTIC NEVI - Tan-brown and/or pink-flesh-colored symmetric macules and papules - Benign appearing on exam today - Observation - Call clinic for new or changing moles - Recommend daily use of broad spectrum spf 30+ sunscreen to sun-exposed areas.    TINEA PEDIS Exam: Scaling and maceration web spaces and over distal and lateral soles. Chronic and persistent condition with duration or expected duration over one year. Condition is symptomatic / bothersome to patient. Not to goal. 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 Condition is symptomatic/ bothersome to patient. Not currently at goal. Seborrheic Dermatitis is a chronic persistent rash  characterized by pinkness and scaling most commonly of the mid face but also can occur on the scalp (dandruff), ears; mid chest, mid back and groin.  It tends to be exacerbated by stress and cooler weather.  People who have neurologic disease may experience new onset or exacerbation of existing seborrheic dermatitis.  The condition is not curable but treatable and can be controlled. Treatment Plan: Can continue Ketoconazole  2 % cream to affected area qd/bid as needed for irritation  ANGULAR CHEILITIS Exam: erythematous cracked patches with moist scale/crust at oral commissure(s)  Chronic condition with duration or expected duration over one year. Currently well-controlled. Angular Cheilitis is chronic skin irritation in folds of corners of mouth.  A moist environment from saliva promotes overgrowth of yeast and bacteria, resulting in chronic inflammation in this area. Treatment Plan: Can continue Ketoconazole  2% cream twice daily to oral commissures as directed  Pink scaly patch at left scapula Exam:  see photos  Treatment Plan: Benign. Observe Will recheck at next follow up   TINEA PEDIS OF BOTH FEET   Related Medications ketoconazole  (NIZORAL ) 2 % cream Apply topically to both feet at bedtime for tinea pedis INFLAMED SEBORRHEIC KERATOSIS (3) above left medial ankle x 1, left forearm x 1, right upper back paraspinal x 1 (3) Will recheck isk above left medial ankle   Symptomatic, irritating, patient would like treated. Destruction of lesion - above left medial ankle x 1, left forearm x 1, right upper back paraspinal x 1 (3) Complexity: simple   Destruction method: cryotherapy   Informed consent: discussed and consent obtained   Timeout:  patient name, date of birth, surgical site, and procedure verified Lesion destroyed using liquid nitrogen: Yes   Region frozen until ice ball extended beyond lesion: Yes   Outcome: patient tolerated procedure well with no complications    Post-procedure details: wound care instructions given    Return in about 3 months (around 12/11/2024) for TBSE hx of mm.  IEleanor Blush, CMA, am acting as scribe for Alm Rhyme, MD.   Documentation: I have reviewed the above documentation for accuracy and completeness, and I agree with the above.  Alm Rhyme, MD

## 2024-09-10 NOTE — Patient Instructions (Addendum)
 Seborrheic Keratosis  What causes seborrheic keratoses? Seborrheic keratoses are harmless, common skin growths that first appear during adult life.  As time goes by, more growths appear.  Some people may develop a large number of them.  Seborrheic keratoses appear on both covered and uncovered body parts.  They are not caused by sunlight.  The tendency to develop seborrheic keratoses can be inherited.  They vary in color from skin-colored to gray, brown, or even black.  They can be either smooth or have a rough, warty surface.   Seborrheic keratoses are superficial and look as if they were stuck on the skin.  Under the microscope this type of keratosis looks like layers upon layers of skin.  That is why at times the top layer may seem to fall off, but the rest of the growth remains and re-grows.    Treatment Seborrheic keratoses do not need to be treated, but can easily be removed in the office.  Seborrheic keratoses often cause symptoms when they rub on clothing or jewelry.  Lesions can be in the way of shaving.  If they become inflamed, they can cause itching, soreness, or burning.  Removal of a seborrheic keratosis can be accomplished by freezing, burning, or surgery. If any spot bleeds, scabs, or grows rapidly, please return to have it checked, as these can be an indication of a skin cancer.   Cryotherapy Aftercare  Wash gently with soap and water everyday.   Apply Vaseline and Band-Aid daily until healed.     Melanoma ABCDEs  Melanoma is the most dangerous type of skin cancer, and is the leading cause of death from skin disease.  You are more likely to develop melanoma if you: Have light-colored skin, light-colored eyes, or red or blond hair Spend a lot of time in the sun Tan regularly, either outdoors or in a tanning bed Have had blistering sunburns, especially during childhood Have a close family member who has had a melanoma Have atypical moles or large birthmarks  Early detection  of melanoma is key since treatment is typically straightforward and cure rates are extremely high if we catch it early.   The first sign of melanoma is often a change in a mole or a new dark spot.  The ABCDE system is a way of remembering the signs of melanoma.  A for asymmetry:  The two halves do not match. B for border:  The edges of the growth are irregular. C for color:  A mixture of colors are present instead of an even brown color. D for diameter:  Melanomas are usually (but not always) greater than 6mm - the size of a pencil eraser. E for evolution:  The spot keeps changing in size, shape, and color.  Please check your skin once per month between visits. You can use a small mirror in front and a large mirror behind you to keep an eye on the back side or your body.   If you see any new or changing lesions before your next follow-up, please call to schedule a visit.  Please continue daily skin protection including broad spectrum sunscreen SPF 30+ to sun-exposed areas, reapplying every 2 hours as needed when you're outdoors.   Staying in the shade or wearing long sleeves, sun glasses (UVA+UVB protection) and wide brim hats (4-inch brim around the entire circumference of the hat) are also recommended for sun protection.     Due to recent changes in healthcare laws, you may see results of your  pathology and/or laboratory studies on MyChart before the doctors have had a chance to review them. We understand that in some cases there may be results that are confusing or concerning to you. Please understand that not all results are received at the same time and often the doctors may need to interpret multiple results in order to provide you with the best plan of care or course of treatment. Therefore, we ask that you please give us  2 business days to thoroughly review all your results before contacting the office for clarification. Should we see a critical lab result, you will be contacted  sooner.   If You Need Anything After Your Visit  If you have any questions or concerns for your doctor, please call our main line at [phone] and press option 4 to reach your doctor's medical assistant. If no one answers, please leave a voicemail as directed and we will return your call as soon as possible. Messages left after 4 pm will be answered the following business day.   You may also send us  a message via MyChart. We typically respond to MyChart messages within 1-2 business days.  For prescription refills, please ask your pharmacy to contact our office. Our fax number is [phone].  If you have an urgent issue when the clinic is closed that cannot wait until the next business day, you can page your doctor at the number below.    Please note that while we do our best to be available for urgent issues outside of office hours, we are not available 24/7.   If you have an urgent issue and are unable to reach us , you may choose to seek medical care at your doctor's office, retail clinic, urgent care center, or emergency room.  If you have a medical emergency, please immediately call 911 or go to the emergency department.  Pager Numbers  - Dr. Hester: [phone]  - Dr. Jackquline: [phone]  - Dr. Claudene: [phone]   - Dr. Raymund: [phone]  In the event of inclement weather, please call our main line at [phone] for an update on the status of any delays or closures.  Dermatology Medication Tips: Please keep the boxes that topical medications come in in order to help keep track of the instructions about where and how to use these. Pharmacies typically print the medication instructions only on the boxes and not directly on the medication tubes.   If your medication is too expensive, please contact our office at [phone] option 4 or send us  a message through MyChart.   We are unable to tell what your co-pay for medications will be in advance as this is  different depending on your insurance coverage. However, we may be able to find a substitute medication at lower cost or fill out paperwork to get insurance to cover a needed medication.   If a prior authorization is required to get your medication covered by your insurance company, please allow us  1-2 business days to complete this process.  Drug prices often vary depending on where the prescription is filled and some pharmacies may offer cheaper prices.  The website www.goodrx.com contains coupons for medications through different pharmacies. The prices here do not account for what the cost may be with help from insurance (it may be cheaper with your insurance), but the website can give you the price if you did not use any insurance.  - You can print the associated coupon and take it with your prescription to the pharmacy.  -  You may also stop by our office during regular business hours and pick up a GoodRx coupon card.  - If you need your prescription sent electronically to a different pharmacy, notify our office through [facility] or by phone at [phone] option 4.     Si Usted Necesita Algo Despus de Su Visita  Tambin puede enviarnos un mensaje a travs de Clinical cytogeneticist. Por lo general respondemos a los mensajes de MyChart en el transcurso de 1 a 2 das hbiles.  Para renovar recetas, por favor pida a su farmacia que se ponga en contacto con nuestra oficina. Randi lakes de fax es [place] [phone].  Si tiene un asunto urgente cuando la clnica est cerrada y que no puede esperar hasta el siguiente da hbil, puede llamar/localizar a su doctor(a) al nmero que aparece a continuacin.   Por favor, tenga en cuenta que aunque hacemos todo lo posible para estar disponibles para asuntos urgentes fuera del horario de [place], no estamos disponibles las 24 horas del da, los 7 [address]  [address] de la [place].   Si tiene un problema urgente y no puede comunicarse con nosotros, puede optar por buscar  atencin mdica  en el consultorio de su doctor(a), en una clnica privada, en un centro de atencin urgente o en una sala de emergencias.  Si tiene Engineer, drilling, por favor llame inmediatamente al 911 o vaya a la sala de emergencias.  Nmeros de bper  - Dr. Hester: [phone]  - Dra. Jackquline: [phone]  - Dr. Claudene: [phone]  - Dra. Kitts: [phone]  En caso de inclemencias del [place], por favor llame a nuestra lnea principal al [phone] para una actualizacin sobre el estado de cualquier retraso o cierre.  Consejos para la medicacin en dermatologa: Por favor, guarde las cajas en las que vienen los medicamentos de uso tpico para ayudarle a seguir las instrucciones sobre dnde y cmo usarlos. Las farmacias generalmente imprimen las instrucciones del medicamento slo en las cajas y no directamente en los tubos del [place].   Si su medicamento es muy caro, por favor, pngase en contacto con landry rieger llamando al [phone] y presione la opcin 4 o envenos un mensaje a travs de Clinical cytogeneticist.   No podemos decirle cul ser su copago por los medicamentos por adelantado ya que esto es diferente dependiendo de la cobertura de su seguro. Sin embargo, es posible que podamos encontrar un medicamento sustituto a Audiological scientist un formulario para que el seguro cubra el medicamento que se considera necesario.   Si se requiere una autorizacin previa para que su compaa de seguros malta su medicamento, por favor permtanos de 1 a 2 das hbiles para completar este proceso.  Los precios de los medicamentos varan con frecuencia dependiendo del Environmental consultant de dnde se surte la receta y alguna farmacias pueden ofrecer precios ms baratos.  El sitio web www.goodrx.com tiene cupones para medicamentos de Health and safety inspector. Los precios aqu no tienen en cuenta lo que podra costar con la ayuda del seguro (puede ser ms barato con su seguro), pero el sitio web puede  darle el precio si no utiliz Tourist information centre manager.  - Puede imprimir el cupn correspondiente y llevarlo con su receta a la farmacia.  - Tambin puede pasar por nuestra oficina durante el horario de atencin regular y Education officer, museum una tarjeta de cupones de GoodRx.  - Si necesita que su receta se enve electrnicamente a Psychiatrist, informe a nuestra oficina a travs de MyChart de [employer] o por  telfono llamando al [phone] y presione la opcin 4.

## 2024-09-11 ENCOUNTER — Other Ambulatory Visit: Payer: Self-pay | Admitting: Cardiology

## 2024-09-14 ENCOUNTER — Encounter: Payer: Self-pay | Admitting: Dermatology

## 2024-09-23 ENCOUNTER — Other Ambulatory Visit: Payer: Self-pay | Admitting: Family Medicine

## 2024-09-23 DIAGNOSIS — F5104 Psychophysiologic insomnia: Secondary | ICD-10-CM

## 2024-09-25 NOTE — Telephone Encounter (Signed)
 ERx

## 2024-10-28 DIAGNOSIS — I10 Essential (primary) hypertension: Secondary | ICD-10-CM

## 2024-10-28 DIAGNOSIS — E785 Hyperlipidemia, unspecified: Secondary | ICD-10-CM

## 2024-10-28 DIAGNOSIS — I2510 Atherosclerotic heart disease of native coronary artery without angina pectoris: Secondary | ICD-10-CM

## 2024-10-30 ENCOUNTER — Telehealth (HOSPITAL_BASED_OUTPATIENT_CLINIC_OR_DEPARTMENT_OTHER): Payer: Self-pay

## 2024-10-30 NOTE — Telephone Encounter (Signed)
 Attempted to call patient to schedule a televisit for a pre-op clearance. No answer left a vm to call back.

## 2024-10-30 NOTE — Telephone Encounter (Signed)
" ° °  Name: Adrian Davis  DOB: 1941-09-23  MRN: [id number]  Primary Cardiologist: Peter Jordan, MD   Preoperative team, please contact this patient and set up a phone call appointment for further preoperative risk assessment. Please obtain consent and complete medication review. Thank you for your help.  I confirm that guidance regarding antiplatelet and oral anticoagulation therapy has been completed and, if necessary, noted below.  Per office protocol, if patient is without any new symptoms or concerns at the time of their virtual visit, he may hold Aspirin for 5-7 days prior to procedure. Please resume Aspirin as soon as possible postprocedure, at the discretion of the surgeon.    I also confirmed the patient resides in the state of  . As per [facility] Medical Board telemedicine laws, the patient must reside in the state in which the provider is licensed.   Damien JAYSON Braver, NP 10/30/2024, 1:28 PM [facility]    "

## 2024-10-30 NOTE — Telephone Encounter (Signed)
 Wife was returning call. Please advise ?

## 2024-10-30 NOTE — Telephone Encounter (Signed)
"  ° °  Pre-operative Risk Assessment    Patient Name: Adrian Davis  DOB: 20-Jan-1941 MRN: [id number]   Date of last office visit: 06/30/2024 with Rosaline Bane, NP Date of next office visit: 12/24/2024 with Dr. Jordan  Request for Surgical Clearance    Procedure:  Spinal Cord Stimulator Placement / ESI  Date of Surgery:  Clearance TBD                                 Surgeon:  Dr. Darlis Socks Group or Practice Name:  [facility]  Phone number:  [phone] ext. [phone] Fax number:  [phone] ATTN: Lauren   Type of Clearance Requested:   - Medical  - Pharmacy:  Hold Aspirin -does not specify   Type of Anesthesia:  General    Additional requests/questions:  None  Signed, Patrcia Iverson CROME   10/30/2024, 12:53 PM   "

## 2024-10-31 ENCOUNTER — Telehealth: Payer: Self-pay

## 2024-10-31 NOTE — Telephone Encounter (Signed)
 Called patient to schedule a televisit for pre-op clearance on 11/05/24 @ 8:40. Meds, Rec, and Consent done.       Patient Consent for Virtual Visit        Adrian Davis has provided verbal consent on 10/31/2024 for a virtual visit (video or telephone).   CONSENT FOR VIRTUAL VISIT FOR:  Adrian Davis  By participating in this virtual visit I agree to the following:  I hereby voluntarily request, consent and authorize [facility] and its employed or contracted physicians, physician assistants, nurse practitioners or other licensed health care professionals (the Practitioner), to provide me with telemedicine health care services (the Services) as deemed necessary by the treating Practitioner. I acknowledge and consent to receive the Services by the Practitioner via telemedicine. I understand that the telemedicine visit will involve communicating with the Practitioner through live audiovisual communication technology and the disclosure of certain medical information by electronic transmission. I acknowledge that I have been given the opportunity to request an in-person assessment or other available alternative prior to the telemedicine visit and am voluntarily participating in the telemedicine visit.  I understand that I have the right to withhold or withdraw my consent to the use of telemedicine in the course of my care at any time, without affecting my right to future care or treatment, and that the Practitioner or I may terminate the telemedicine visit at any time. I understand that I have the right to inspect all information obtained and/or recorded in the course of the telemedicine visit and may receive copies of available information for a reasonable fee.  I understand that some of the potential risks of receiving the Services via telemedicine include:  Delay or interruption in medical evaluation due to technological equipment failure or disruption; Information transmitted may not  be sufficient (e.g. poor resolution of images) to allow for appropriate medical decision making by the Practitioner; and/or  In rare instances, security protocols could fail, causing a breach of personal health information.  Furthermore, I acknowledge that it is my responsibility to provide information about my medical history, conditions and care that is complete and accurate to the best of my ability. I acknowledge that Practitioner's advice, recommendations, and/or decision may be based on factors not within their control, such as incomplete or inaccurate data provided by me or distortions of diagnostic images or specimens that may result from electronic transmissions. I understand that the practice of medicine is not an exact science and that Practitioner makes no warranties or guarantees regarding treatment outcomes. I acknowledge that a copy of this consent can be made available to me via my patient portal [facility] MyChart), or I can request a printed copy by calling the office of [facility].    I understand that my insurance will be billed for this visit.   I have read or had this consent read to me. I understand the contents of this consent, which adequately explains the benefits and risks of the Services being provided via telemedicine.  I have been provided ample opportunity to ask questions regarding this consent and the Services and have had my questions answered to my satisfaction. I give my informed consent for the services to be provided through the use of telemedicine in my medical care

## 2024-10-31 NOTE — Telephone Encounter (Signed)
 Called patient to schedule a televisit for pre-op clearance on 11/05/24 @ 8:40. Meds, Rec, and Consent done.

## 2024-11-04 NOTE — Progress Notes (Unsigned)
 "   Virtual Visit via Telephone Note   Because of Adrian Davis co-morbid illnesses, he is at least at moderate risk for complications without adequate follow up.  This format is felt to be most appropriate for this patient at this time.  Due to technical limitations with video connection web designer), today's appointment will be conducted as an audio only telehealth visit, and Adrian Davis verbally agreed to proceed in this manner.   All issues noted in this document were discussed and addressed.  No physical exam could be performed with this format.  Evaluation Performed:  Preoperative cardiovascular risk assessment _____________   Date:  11/05/2024   Patient ID:  Adrian Davis, DOB 01-21-1941, MRN [id number] Patient Location:  Home Provider location:   Office  Primary Care Provider:  Rilla Baller, MD Primary Cardiologist:  Peter Jordan, MD  Chief Complaint / Patient Profile   84 y.o. y/o male with a h/o HTN, coronary artery calcification on CT, HLD, mild LVH, malignant melanoma who is pending spinal cord stimulator placement/ESI  and presents today for telephonic preoperative cardiovascular risk assessment.  History of Present Illness    Adrian Davis is a 84 y.o. male who presents via audio/video conferencing for a telehealth visit today.  Pt was last seen in cardiology clinic on 06/30/24 by Rosaline Bane, NP.  At that time Adrian Davis was doing well.  The patient is now pending procedure as outlined above. Since his last visit, he denies chest pain, shortness of breath, lower extremity edema, fatigue, palpitations, melena, hematuria, hemoptysis, diaphoresis, weakness, presyncope, syncope, orthopnea, and PND. He remains active on his farm and is able to achieve > 4 METS activity without concerning side effects.   Past Medical History    Past Medical History:  Diagnosis Date   Abnormal echocardiogram 10/2010   grade 1 diastolic dysfunction, normal EF, mild LVH   Anemia     Basal cell carcinoma 04/08/2019   left nose inferior to nasal alar groove. EDC: 05/20/2019   Bell palsy 10/2010   presented with facial numbness   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    C. 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 Norman on 06/21/2024   Nephrolithiasis    Normal nuclear stress test 2008   Osteoarthritis    Skin cancer    Vocal cord leukoplakia    Dr Adrian Davis   Past Surgical History:  Procedure Laterality Date   COLONOSCOPY  04/2019   TAx2, hemorrhoids, rpt 50yrs ([place])   ESOPHAGOGASTRODUODENOSCOPY  04/2019   erosive gastropathy, biopsy negative for barrett's ([place])   KNEE ARTHROSCOPY Right    LUMBAR LAMINECTOMY/DECOMPRESSION MICRODISCECTOMY N/A 11/12/2013   Reyes JONETTA Budge, MD - complicated by urinary retention   MULTIPLE TOOTH EXTRACTIONS     SHOULDER SURGERY Bilateral    TONSILLECTOMY     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   Procedure: TOTAL HIP ARTHROPLASTY ANTERIOR APPROACH;  Surgeon: Melodi Lerner, MD;  Location: [facility];  Service: Orthopedics;  Laterality: Right;    TOTAL  KNEE ARTHROPLASTY Left 11/21/2021   Procedure: TOTAL KNEE ARTHROPLASTY;  Surgeon: Melodi Lerner, MD;  Location: [facility];  Service: Orthopedics;  Laterality: Left;    Allergies  Allergies[1]  Home Medications    Prior to Admission medications  Medication Sig Start Date End Date Taking? Authorizing Provider  acetaminophen  (TYLENOL ) 650 MG CR tablet Take 650 mg by mouth 3 (three) times daily.    [provider]  allopurinol  (ZYLOPRIM ) 100 MG tablet Take 1 tablet (100  mg total) by mouth daily. 02/01/24   Rilla Baller, MD  amLODipine  (NORVASC ) 2.5 MG tablet TAKE TWO TABLETS BY MOUTH EVERY MORNING AND ONE TABLET IN THE AFTERNOON AS DIRECTED 09/15/24   Jordan, Peter M, MD  aspirin 81 MG chewable tablet Chew 81 mg by mouth every evening.    [provider]  colchicine  0.6 MG tablet Take 2 tablets by mouth at gout onset, then take 1 tablet daily until gout flare resolved 01/31/23   Rilla Baller, MD  cyanocobalamin  (VITAMIN B12) 1000 MCG tablet Take 1 tablet (1,000 mcg total) by mouth every Monday, Wednesday, and Friday. 01/31/23   Rilla Baller, MD  docusate sodium  (COLACE) 100 MG capsule Take 1 capsule (100 mg total) by mouth 2 (two) times daily. 06/10/20   Jordan, Peter M, MD  famotidine  (PEPCID ) 40 MG tablet Take 1 tablet (40 mg total) by mouth at bedtime. 02/01/24   Rilla Baller, MD  ketoconazole  (NIZORAL ) 2 % cream Apply 1 application topically daily as needed for irritation.    [provider]  ketoconazole  (NIZORAL ) 2 % cream Apply topically to both feet at bedtime for tinea pedis 09/10/24   Hester Alm BROCKS, MD  loratadine (CLARITIN) 10 MG tablet Take 10 mg by mouth daily.    [provider]  meloxicam  (MOBIC ) 7.5 MG tablet Take 7.5 mg by mouth daily. 10/04/21   [provider]  oxazepam  (SERAX ) 10 MG capsule TAKE ONE CAPSULE (10 MG TOTAL) BY MOUTH AT BEDTIME AS NEEDED FOR SLEEP OR ANXIETY (USE SPARINGLY). 09/25/24   Rilla Baller, MD  pantoprazole  (PROTONIX ) 40 MG tablet Take 1 tablet (40 mg total) by mouth daily. 02/01/24   Rilla Baller, MD  polyethylene glycol powder (GLYCOLAX /MIRALAX ) 17 GM/SCOOP powder Take 17 g by mouth at bedtime. 02/22/24   Cleatus Arlyss RAMAN, MD  simvastatin  (ZOCOR ) 10 MG tablet TAKE ONE TABLET BY MOUTH ONCE A DAY AT SIX IN THE EVENING 12/31/23   Jordan, Peter M, MD  spironolactone  (ALDACTONE ) 25 MG tablet TAKE ONE TABLET (25 MG TOTAL) BY MOUTH DAILY. 08/26/24   Jordan, Peter M, MD   tamsulosin  (FLOMAX ) 0.4 MG CAPS capsule TAKE ONE CAPSULE BY MOUTH ONCE DAILY AFTER SUPPER 03/03/24   Jordan, Peter M, MD  traZODone  (DESYREL ) 50 MG tablet Take 1 tablet (50 mg total) by mouth at bedtime as needed for sleep. 03/28/24   Rilla Baller, MD  valsartan  (DIOVAN ) 320 MG tablet TAKE 1 TABLET (320 MG TOTAL) BY MOUTH DAILY. 09/15/24   Jordan, Peter M, MD    Physical Exam    Vital Signs:  Adrian Davis does not have vital signs available for review today.  Given telephonic nature of communication, physical exam is limited. AAOx3. NAD. Normal affect.  Speech and respirations are unlabored.  Accessory Clinical Findings    None  Assessment & Plan    1.  Preoperative Cardiovascular Risk Assessment: According to the Revised Cardiac Risk Index (RCRI), his Perioperative Risk of Major Cardiac Event is (%): 0.4. His  Functional Capacity in METs is: 6.05 according to the Duke Activity Status Index (DASI). The patient is doing well from a cardiac perspective. Therefore, based on ACC/AHA guidelines, the patient would be at acceptable risk for the planned procedure without further cardiovascular testing.   The patient was advised that if he develops new symptoms prior to surgery to contact our office to arrange for a follow-up visit, and he verbalized understanding.  Per office protocol, he may hold aspirin for 5-7 days prior to procedure and should resume as soon as hemodynamically stable postoperatively.  A copy of this note will be routed to requesting surgeon.  Time:   Today, I have spent 10 minutes with the patient with telehealth technology discussing medical history, symptoms, and management plan.     Rosaline EMERSON Bane, NP-C  11/05/2024, 8:48 AM [address], [address] [place], [place] [address] Office [phone] Fax [phone]      [1] No Known Allergies  "

## 2024-11-05 ENCOUNTER — Ambulatory Visit

## 2024-11-05 DIAGNOSIS — Z01810 Encounter for preprocedural cardiovascular examination: Secondary | ICD-10-CM | POA: Diagnosis present

## 2024-11-20 ENCOUNTER — Telehealth: Payer: Self-pay | Admitting: Family Medicine

## 2024-11-20 ENCOUNTER — Other Ambulatory Visit: Payer: Self-pay | Admitting: Family Medicine

## 2024-11-20 DIAGNOSIS — F5104 Psychophysiologic insomnia: Secondary | ICD-10-CM

## 2024-11-20 NOTE — Telephone Encounter (Signed)
 Copied from CRM [id number]. Topic: General - Other >> Nov 20, 2024  3:42 PM Revonda D wrote: Reason for CRM: Pt's wife stated that [employer] is faxing over forms to the office regarding the Oxazepam  and wanted to provide the fax number for [employer]. Fax: [phone]

## 2024-11-20 NOTE — Telephone Encounter (Signed)
 Name of Medication:  Oxazepam   Name of Pharmacy:  [facility] Last Fill or Written Date and Quantity:  10/22/24, #30 Last Office Visit and Type:  03/28/24, chronic insomnia f/u Next Office Visit and Type:  none Last Controlled Substance Agreement Date:  none Last UDS:  none

## 2024-11-21 NOTE — Telephone Encounter (Signed)
 Form received placed in providers box for review.

## 2024-11-24 NOTE — Telephone Encounter (Signed)
 ERx

## 2024-12-10 ENCOUNTER — Ambulatory Visit: Admitting: Dermatology

## 2024-12-24 ENCOUNTER — Ambulatory Visit: Admitting: Cardiology

## 2025-01-29 ENCOUNTER — Ambulatory Visit

## 2025-02-03 ENCOUNTER — Ambulatory Visit

## 2025-02-06 ENCOUNTER — Ambulatory Visit
